# Patient Record
Sex: Female | Born: 1935 | Race: Asian | Hispanic: No | Smoking: Never smoker
Health system: Southern US, Community
[De-identification: ages and names within clinical notes are randomized; demographics above are authoritative.]

## PROBLEM LIST (undated history)

## (undated) DIAGNOSIS — W19XXXA Unspecified fall, initial encounter: Secondary | ICD-10-CM

## (undated) DIAGNOSIS — N12 Tubulo-interstitial nephritis, not specified as acute or chronic: Secondary | ICD-10-CM

## (undated) DIAGNOSIS — F0280 Dementia in other diseases classified elsewhere without behavioral disturbance: Secondary | ICD-10-CM

## (undated) DIAGNOSIS — M1990 Unspecified osteoarthritis, unspecified site: Secondary | ICD-10-CM

## (undated) HISTORY — DX: Unspecified fall, initial encounter: W19.XXXA

## (undated) HISTORY — DX: Unspecified osteoarthritis, unspecified site: M19.90

## (undated) HISTORY — DX: Tubulo-interstitial nephritis, not specified as acute or chronic: N12

## (undated) HISTORY — DX: Dementia in other diseases classified elsewhere, unspecified severity, without behavioral disturbance, psychotic disturbance, mood disturbance, and anxiety: F02.80

---
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
## 2020-07-10 ENCOUNTER — Telehealth: Payer: Self-pay | Admitting: Family

## 2020-07-10 NOTE — Telephone Encounter (Signed)
Late Entry 07/07/2020: [facility] Nurse called on call provider states patient's Urologist ordered in and out cath every 6 hr but patient's son declines would like patient to be toilet every six hours even unable to void then do a bladder scan then in and out cath.Nurse unable to reach urologist but will call on 07/10/2020.Orders given per POA's request.

## 2020-07-19 ENCOUNTER — Non-Acute Institutional Stay: Payer: Medicare Other | Admitting: Internal Medicine

## 2020-07-19 ENCOUNTER — Other Ambulatory Visit: Payer: Self-pay

## 2020-07-19 ENCOUNTER — Encounter: Payer: Self-pay | Admitting: Internal Medicine

## 2020-07-19 VITALS — BP 120/74 | HR 65 | Temp 97.2°F | Ht 61.0 in | Wt 140.5 lb

## 2020-07-19 DIAGNOSIS — F0390 Unspecified dementia without behavioral disturbance: Secondary | ICD-10-CM

## 2020-07-19 DIAGNOSIS — Z8616 Personal history of COVID-19: Secondary | ICD-10-CM

## 2020-07-19 DIAGNOSIS — N3941 Urge incontinence: Secondary | ICD-10-CM | POA: Insufficient documentation

## 2020-07-19 DIAGNOSIS — R2689 Other abnormalities of gait and mobility: Secondary | ICD-10-CM | POA: Diagnosis not present

## 2020-07-19 DIAGNOSIS — N390 Urinary tract infection, site not specified: Secondary | ICD-10-CM | POA: Insufficient documentation

## 2020-07-19 DIAGNOSIS — R42 Dizziness and giddiness: Secondary | ICD-10-CM | POA: Diagnosis not present

## 2020-07-19 DIAGNOSIS — G301 Alzheimer's disease with late onset: Secondary | ICD-10-CM

## 2020-07-19 DIAGNOSIS — R29898 Other symptoms and signs involving the musculoskeletal system: Secondary | ICD-10-CM

## 2020-07-19 DIAGNOSIS — R4182 Altered mental status, unspecified: Secondary | ICD-10-CM | POA: Insufficient documentation

## 2020-07-19 DIAGNOSIS — R350 Frequency of micturition: Secondary | ICD-10-CM | POA: Insufficient documentation

## 2020-07-19 DIAGNOSIS — Z7409 Other reduced mobility: Secondary | ICD-10-CM

## 2020-07-19 DIAGNOSIS — N9089 Other specified noninflammatory disorders of vulva and perineum: Secondary | ICD-10-CM

## 2020-07-19 DIAGNOSIS — I10 Essential (primary) hypertension: Secondary | ICD-10-CM | POA: Insufficient documentation

## 2020-07-19 DIAGNOSIS — U071 COVID-19: Secondary | ICD-10-CM | POA: Insufficient documentation

## 2020-07-19 DIAGNOSIS — R609 Edema, unspecified: Secondary | ICD-10-CM | POA: Insufficient documentation

## 2020-07-19 DIAGNOSIS — M4800 Spinal stenosis, site unspecified: Secondary | ICD-10-CM

## 2020-07-19 DIAGNOSIS — F0280 Dementia in other diseases classified elsewhere without behavioral disturbance: Secondary | ICD-10-CM

## 2020-07-19 DIAGNOSIS — N319 Neuromuscular dysfunction of bladder, unspecified: Secondary | ICD-10-CM

## 2020-07-19 DIAGNOSIS — K5900 Constipation, unspecified: Secondary | ICD-10-CM | POA: Diagnosis not present

## 2020-07-19 DIAGNOSIS — R8271 Bacteriuria: Secondary | ICD-10-CM

## 2020-07-19 DIAGNOSIS — J159 Unspecified bacterial pneumonia: Secondary | ICD-10-CM | POA: Insufficient documentation

## 2020-07-19 DIAGNOSIS — R531 Weakness: Secondary | ICD-10-CM

## 2020-07-19 HISTORY — DX: Other abnormalities of gait and mobility: R26.89

## 2020-07-19 HISTORY — DX: Unspecified dementia, unspecified severity, without behavioral disturbance, psychotic disturbance, mood disturbance, and anxiety: F03.90

## 2020-07-19 HISTORY — DX: Spinal stenosis, site unspecified: M48.00

## 2020-07-19 HISTORY — DX: Other symptoms and signs involving the musculoskeletal system: R29.898

## 2020-07-19 HISTORY — DX: Constipation, unspecified: K59.00

## 2020-07-19 MED ORDER — ESTRADIOL 2 MG VA RING: 2.0000 mg | VAGINAL_RING | VAGINAL | 1 refills | Status: DC

## 2020-07-19 NOTE — Progress Notes (Signed)
Provider:  Rexene Edison. Mariea Clonts, D.O., C.M.D. Location:  Artist of Service:  Clinic (12)  Previous PCP:  Gayland Curry, DO Patient Care Team: Gayland Curry, DO as PCP - General (Geriatric Medicine) Domingo Pulse, MD (Urology)  Extended Emergency Contact Information Primary Emergency Contact: Otilio Jefferson Home Phone: [phone] Work Phone: [phone] Mobile Phone: [phone] Relation: Son Secondary Emergency Contact: Sundra Aland Work Phone: [phone] Mobile Phone: [phone] Relation: Spouse  Code Status: DNR Goals of Care: Advanced Directive information Advanced Directives 07/19/2020  Does Patient Have a Medical Advance Directive? Yes  Type of Paramedic of [place];Out of facility DNR (pink MOST or yellow form)  Does patient want to make changes to medical advance directive? No - Patient declined  Copy of [place] in Chart? Yes - validated most recent copy scanned in chart (See row information)  Pre-existing out of facility DNR order (yellow form or pink MOST form) Pink MOST/Yellow Form most recent copy in chart - Physician notified to receive inpatient order  Has living will and durable POA for [facility] on file at [place] and copies were to be printed and scanned into vynca   Chief Complaint  Patient presents with  . Establish Care    New patient to establish care     HPI: Patient is a 85 y.o. female seen today to establish with [facility] at [facility].  Some records are in her chart from her prior neurologist.  Her son, Dr. Dwana Monroe, is here with her today for this first appointment--he lives closest by in [place] and is a Regulatory affairs officer.  Cheryl Monroe has been staying temporarily in rehab because her urinary incontinence was so severe and she could not be managed in assisted living.    According to her, she "pees all of the time."  She tries to push herself up, urine  just comes out.    Per Raj, 2016 she was at her baseline.    2018 in Feb, at some point, started having urinary retention, developed sepsis and was found down.  Had a 6 week admission.  She'd complained of heaviness of her legs.  She'd started having cognitive loss but recently the word finding has been much worse.   Workup in [facility] was "incomplete" per her son.  Had full-on delirium then.  Tumors ruled out.  A lot may be deconditioning.  Her son says she spent a lot of time in bed.  Previously was able to manage independently.  She could no longer reference the mall she'd known since 1999--enjoyed shopping.  Urosepsis made her cognitive deficits more notable.  She saw Dr. Amalia Hailey here.  She also has had issues with constipation contributing to her bladder control and retention.  Had not been emptying for a long time.  She's been started on estrace cream per his notes but was actually on estring already which long-term care pharmacy could not obtain.  She is now on the macrodantin prophylaxis.  E coli and klebsiella have been her typical organisms.  Was resistant to keflex and bactrim.  Dr. Amalia Hailey has suggested I/O caths by patient.  Nursing has been doing the I/o caths.  Her daughter had been doing caths on her 1-2 per week,not several times per day.  There was no surveillance for the wet pads b/c she still lived alone and her daughter checked in on her in her last home.  Last episode of UTI was in  spring this past year.    Caths are now at bid (ordered qid prn, but only needed bid).  Her son thinks she is not doing as well being at skilled level.  He worries about her cognition in a SNF environment. He admits that she classically spent her days sitting at home in bed and was no longer very active.    Nursing and therapy report she has had improvement in strength and mobility since arrival, but remains a skilled level of care due to her incontinence.  MMSE 13/30, failing clock drawing.   Past  Medical History:  Diagnosis Date  . Alzheimer disease (HCC)   . Fall   . Osteoarthritis   . Pyelonephritis    History reviewed. No pertinent surgical history.  Social History   Socioeconomic History  . Marital status: Legally Separated    Spouse name: Not on file  . Number of children: 3  . Years of education: Not on file  . Highest education level: Some college, no degree  Occupational History  . Not on file  Tobacco Use  . Smoking status: Never Smoker  . Smokeless tobacco: Never Used  Substance and Sexual Activity  . Alcohol use: Not on file  . Drug use: Not Currently  . Sexual activity: Not on file  Other Topics Concern  . Not on file  Social History Narrative  . Not on file   Social Determinants of Health   Financial Resource Strain:   . Difficulty of Paying Living Expenses: Not on file  Food Insecurity:   . Worried About Programme researcher, broadcasting/film/video in the Last Year: Not on file  . Ran Out of Food in the Last Year: Not on file  Transportation Needs:   . Lack of Transportation (Medical): Not on file  . Lack of Transportation (Non-Medical): Not on file  Physical Activity:   . Days of Exercise per Week: Not on file  . Minutes of Exercise per Session: Not on file  Stress:   . Feeling of Stress : Not on file  Social Connections:   . Frequency of Communication with Friends and Family: Not on file  . Frequency of Social Gatherings with Friends and Family: Not on file  . Attends Religious Services: Not on file  . Active Member of Clubs or Organizations: Not on file  . Attends Banker Meetings: Not on file  . Marital Status: Not on file    reports that she has never smoked. She has never used smokeless tobacco. She reports previous drug use. No history on file for alcohol use.   Family History  Problem Relation Age of Onset  . ALS Mother     Health Maintenance  Topic Date Due  . TETANUS/TDAP  Never done  . DEXA SCAN  Never done  . PNA vac Low Risk  Adult (2 of 2 - PPSV23) 09/29/2019  . INFLUENZA VACCINE  05/21/2020  . COVID-19 Vaccine  Completed    Allergies  Allergen Reactions  . Sulfa Antibiotics Rash    Rash to trunk, legs, neck, scalp, and arms    Outpatient Encounter Medications as of 07/19/2020  Medication Sig  . Cranberry (THERACRAN PO) Take 360 mg by mouth.  . D-MANNOSE PO Take 300 g by mouth.  . docusate sodium (COLACE) 100 MG capsule Take 100 mg by mouth 2 (two) times daily.  Marland Kitchen senna (SENOKOT) 8.6 MG tablet Take 1 tablet by mouth daily.  . [DISCONTINUED] nitrofurantoin, macrocrystal-monohydrate, (MACROBID) 100 MG  capsule Take 100 mg by mouth 2 (two) times daily.  Marland Kitchen estradiol (ESTRING) 2 MG vaginal ring Place 2 mg vaginally every 3 (three) months. follow package directions   No facility-administered encounter medications on file as of 07/19/2020.    Review of Systems  Constitutional: Negative for chills, fever and malaise/fatigue.  HENT: Negative for congestion and sore throat.   Eyes: Negative for blurred vision.  Respiratory: Negative for cough and shortness of breath.   Cardiovascular: Negative for chest pain, palpitations and leg swelling.  Gastrointestinal: Positive for constipation. Negative for abdominal pain, blood in stool, diarrhea, heartburn and Monroe.  Genitourinary: Positive for frequency and urgency. Negative for dysuria, flank pain and hematuria.  Musculoskeletal: Positive for back pain, falls and myalgias. Negative for joint pain.  Skin: Negative for itching and rash.  Neurological: Positive for tingling, sensory change and weakness. Negative for dizziness and loss of consciousness.  Endo/Heme/Allergies: Does not bruise/bleed easily.  Psychiatric/Behavioral: Positive for memory loss. Negative for depression and hallucinations. The patient is not nervous/anxious and does not have insomnia.     Vitals:   05/29/20 0953 07/19/20 1000  BP: 140/80 120/74  Pulse: 69 65  Temp: (!) 97 F (36.1 C) (!)  97.2 F (36.2 C)  TempSrc:  Temporal  SpO2:  97%  Weight: 144 lb (65.3 kg) 140 lb 8 oz (63.7 kg)  Height:  $Remove'5\' 1"'IYuWOLP$  (1.549 m)   Body mass index is 26.55 kg/m. Physical Exam Vitals reviewed.  Constitutional:      General: She is not in acute distress.    Appearance: Normal appearance. She is normal weight. She is not toxic-appearing.  HENT:     Head: Normocephalic and atraumatic.     Right Ear: External ear normal.     Left Ear: External ear normal.     Nose: Nose normal.     Mouth/Throat:     Pharynx: Oropharynx is clear.  Eyes:     Extraocular Movements: Extraocular movements intact.     Conjunctiva/sclera: Conjunctivae normal.     Pupils: Pupils are equal, round, and reactive to light.  Cardiovascular:     Rate and Rhythm: Normal rate and regular rhythm.     Pulses: Normal pulses.     Heart sounds: Normal heart sounds.  Pulmonary:     Effort: Pulmonary effort is normal.     Breath sounds: Normal breath sounds. No wheezing, rhonchi or rales.  Abdominal:     General: Bowel sounds are normal. There is no distension.     Palpations: Abdomen is soft.     Tenderness: There is no abdominal tenderness. There is no right CVA tenderness, left CVA tenderness, guarding or rebound.  Musculoskeletal:        General: Normal range of motion.     Cervical back: Neck supple.     Right lower leg: No edema.     Left lower leg: Edema present.     Comments: Slight edema left vs right ankle and foot; cooler temp of left foot and sensation diminished vs right on exam; slight LLE weakness--4+/5 vs rightDP and PT pulses were intact; very difficult getting up out of chair and off of exam table--required cues to put hands down on chair or table to push up then reach for walker, moves slowly; also struggled to sit up from lying down; had been pushed in wheelchair from rehab to clinic due to distance  Lymphadenopathy:     Cervical: No cervical adenopathy.  Skin:  General: Skin is warm and dry.    Neurological:     Mental Status: She is alert.     Cranial Nerves: No cranial nerve deficit.     Sensory: Sensory deficit present.     Motor: Weakness present.     Coordination: Coordination normal.     Gait: Gait abnormal.     Deep Tendon Reflexes: Reflexes abnormal (1+).     Comments: Oriented to person, place, not time; knows family members  Psychiatric:        Mood and Affect: Mood normal.     Comments: Very pleasant lady     Labs reviewed: Basic Metabolic Panel: No results for input(s): NA, K, CL, CO2, GLUCOSE, BUN, CREATININE, CALCIUM, MG, PHOS in the last 8760 hours. Liver Function Tests: No results for input(s): AST, ALT, ALKPHOS, BILITOT, PROT, ALBUMIN in the last 8760 hours. No results for input(s): LIPASE, AMYLASE in the last 8760 hours. No results for input(s): AMMONIA in the last 8760 hours. CBC: No results for input(s): WBC, NEUTROABS, HGB, HCT, MCV, PLT in the last 8760 hours. Cardiac Enzymes: No results for input(s): CKTOTAL, CKMB, CKMBINDEX, TROPONINI in the last 8760 hours. BNP: Invalid input(s): POCBNP No results found for: HGBA1C No results found for: TSH No results found for: VITAMINB12 No results found for: FOLATE No results found for: IRON, TIBC, FERRITIN  Imaging and Procedures noted on new patient packet: Echo this year with mild AR, grade 2 left ventricular diastolic dysfunction   Records are at the office where they were being abstracted by CMA, not at facility to complete note  Assessment/Plan 1. Orthostatic dizziness -cont to encourage adequate hydration, slow positional changes, may implement compression hose and/or abdominal compression binder if significant over time  2. Balance problem -cont PT, OT and SNF  3. Constipation, unspecified constipation type -maintained on bid colace, and also daily senokot; standing order prn miralax available when needed  4. Clitoral irritation -has been on estrogen ring for this previously and will  need to obtain from outside pharmacy so sent to [facility] and [name] to pick up  5. Asymptomatic bacteriuria -notable, avoid overtesting urine--only check when mcgeer criteria symptoms met, push fluids first -cont D mannose--discussed that the date may not be there for it, but some patient still reap benefits -also on cranberry capsules with similar efficacy -cont estring  -sounds like she has some genitourinary syndrome of menopause/postmenopause  6. Urinary frequency -related to above, has tried multiple medications and therapies, followed closely by Dr. Amalia Hailey now her locally--keep f/u as planned -was retaining so continue bid I/O caths that have been needed, does void on own as well -prevent constipation  -change undergarments regularly and maintain good hygiene which requires snf or caregiver in AL level  7. Urge incontinence -again, as in #7  8. Weakness of left lower extremity -obtain bilateral arterial dopplers with ABIs and get results to me or NP  9. Essential hypertension -bp controlled and not on meds recently  10. Weakness -cont therapy and SNF assistance  11. Impaired mobility -concerning with fall risk and has fallen previously in AL here, continue walker and functional assistance, PT, OT  12. h/o COVID-19 -noted  13. Spinal stenosis, unspecified spinal region -per records, had some increased back pain when transferred to snf from AL after fall and her severe incontinence -improved now, may use tylenol and topical therapies, heat for this  14.  Alzheimer's disease -biggest issue, likely also contributory to her urinary incontinence (functional component) -she  requires assistance with bathing, dressing, grooming and quite a bit of cuing for transfers  Labs/tests ordered:  Cbc, cmp, flp, tsh, b12, arterial dopplers with ABI bilateral  Adolfo Granieri L. Venus Ruhe, D.O. [facility] [address], [place] [address] Cell Phone  (Mon-Fri 8am-5pm):  [phone] On Call:  [phone] & follow prompts after 5pm & weekends Office Phone:  [phone] Office Fax:  [phone]

## 2020-07-20 ENCOUNTER — Encounter: Payer: Self-pay | Admitting: Adult Health

## 2020-07-20 ENCOUNTER — Encounter: Payer: Self-pay | Admitting: Internal Medicine

## 2020-07-20 ENCOUNTER — Non-Acute Institutional Stay (SKILLED_NURSING_FACILITY): Payer: Medicare Other | Admitting: Adult Health

## 2020-07-20 DIAGNOSIS — T50905A Adverse effect of unspecified drugs, medicaments and biological substances, initial encounter: Secondary | ICD-10-CM | POA: Diagnosis not present

## 2020-07-20 LAB — LIPID PANEL
Cholesterol: 235 — AB (ref 0–200)
HDL: 100 — AB (ref 35–70)
LDL Cholesterol: 134
Triglycerides: 102 (ref 40–160)

## 2020-07-20 LAB — COMPREHENSIVE METABOLIC PANEL
Albumin: 4.2 (ref 3.5–5.0)
Calcium: 9.7 (ref 8.7–10.7)
Globulin: 2.9

## 2020-07-20 LAB — CBC: RBC: 5.01 (ref 3.87–5.11)

## 2020-07-20 LAB — HEPATIC FUNCTION PANEL
ALT: 13 (ref 7–35)
AST: 18 (ref 13–35)

## 2020-07-20 LAB — BASIC METABOLIC PANEL
BUN: 22 — AB (ref 4–21)
CO2: 22 (ref 13–22)
Chloride: 100 (ref 99–108)
Creatinine: 0.8 (ref 0.5–1.1)
Glucose: 175
Potassium: 4.5 (ref 3.4–5.3)
Sodium: 135 — AB (ref 137–147)

## 2020-07-20 LAB — CBC AND DIFFERENTIAL
HCT: 44 (ref 36–46)
Hemoglobin: 14.8 (ref 12.0–16.0)
Platelets: 225 (ref 150–399)
WBC: 6.4

## 2020-07-20 LAB — VITAMIN B12: Vitamin B-12: 479

## 2020-07-20 LAB — TSH: TSH: 1.13 (ref 0.41–5.90)

## 2020-07-20 NOTE — Progress Notes (Signed)
Location:  Medical illustrator of Service:  SNF (31) Provider:   Peggye Ley, ANP [facility] [phone]   [name], DO  Patient Care Team: [name], DO as PCP - General (Geriatric Medicine) Jamison Neighbor, MD (Urology)  Extended Emergency Contact Information Primary Emergency Contact: Serayah, Yazdani Home Phone: [phone] Work Phone: [phone] Mobile Phone: [phone] Relation: Son Secondary Emergency Contact: Corinna Lines Work Phone: [phone] Mobile Phone: [phone] Relation: Spouse  Code Status:  DNR Goals of care: Advanced Directive information Advanced Directives 07/19/2020  Does Patient Have a Medical Advance Directive? Yes  Type of Estate agent of [place];Out of facility DNR (pink MOST or yellow form)  Does patient want to make changes to medical advance directive? No - Patient declined  Copy of Healthcare Power of Attorney in Chart? Yes - validated most recent copy scanned in chart (See row information)  Pre-existing out of facility DNR order (yellow form or pink MOST form) Pink MOST/Yellow Form most recent copy in chart - Physician notified to receive inpatient order     Chief Complaint  Patient presents with  . Acute Visit    rash    HPI:  Pt is a 85 y.o. female seen today for an acute visit for itchy rash. She started with an itchy rash to the buttocks area on 9/29.  On 9/30 the rash had spread to her back, buttocks, thigh, abd, arms, neck, and scalp. She is very itchy and had a dose of zyrtec last evening.  She is not having any wheezing, sob, or fever . She completed a 1 week course of Bactrim for UTI on 9/28 and started back on preventative UTI treatment with macrobid on 9/29. I spoke with her son and he indicated that she had not had this reaction before with sulfa. She has been on macrobid since at least August.     Past Medical History:  Diagnosis Date  . Alzheimer  disease (HCC)   . Fall   . Osteoarthritis   . Pyelonephritis    History reviewed. No pertinent surgical history.  No Known Allergies  Outpatient Encounter Medications as of 07/20/2020  Medication Sig  . cetirizine (ZYRTEC) 10 MG tablet Take 10 mg by mouth at bedtime.  . predniSONE (DELTASONE) 10 MG tablet Take 40 mg by mouth daily with breakfast. Prednisone 40 mg qd x 4, 30 mg qd x 3, 20 mg qd x 2d, 10 mg qd x 1  . Cranberry (THERACRAN PO) Take 360 mg by mouth.  . D-MANNOSE PO Take 300 g by mouth.  . docusate sodium (COLACE) 100 MG capsule Take 100 mg by mouth 2 (two) times daily.  Marland Kitchen estradiol (ESTRING) 2 MG vaginal ring Place 2 mg vaginally every 3 (three) months. follow package directions  . senna (SENOKOT) 8.6 MG tablet Take 1 tablet by mouth daily.  . [DISCONTINUED] nitrofurantoin, macrocrystal-monohydrate, (MACROBID) 100 MG capsule Take 100 mg by mouth 2 (two) times daily.   No facility-administered encounter medications on file as of 07/20/2020.    Review of Systems  Constitutional: Negative for activity change, appetite change, chills, diaphoresis and fatigue.  Respiratory: Negative for cough, shortness of breath and wheezing.   Skin: Positive for rash (pruritis).    Immunization History  Administered Date(s) Administered  . Influenza-Unspecified 08/05/2018, 09/29/2018, 07/02/2019, 08/21/2019  . Moderna SARS-COVID-2 Vaccination 11/04/2019, 12/02/2019  . Pneumococcal Conjugate-13 09/28/2018   Pertinent  Health Maintenance Due  Topic Date Due  .  DEXA SCAN  Never done  . PNA vac Low Risk Adult (2 of 2 - PPSV23) 09/29/2019  . INFLUENZA VACCINE  05/21/2020   Fall Risk  07/19/2020  Falls in the past year? 1  Number falls in past yr: 0  Injury with Fall? 0   Functional Status Survey:    Vitals:   07/20/20 1041  BP: 114/70  Pulse: 67  Resp: 16  Temp: (!) 97.2 F (36.2 C)  SpO2: 97%   There is no height or weight on file to calculate BMI. Physical Exam Vitals  and nursing note reviewed.  Constitutional:      General: She is not in acute distress.    Appearance: She is not diaphoretic.  HENT:     Head: Normocephalic and atraumatic.  Neck:     Vascular: No JVD.  Cardiovascular:     Rate and Rhythm: Normal rate and regular rhythm.     Heart sounds: No murmur heard.   Pulmonary:     Effort: Pulmonary effort is normal. No respiratory distress.     Breath sounds: Normal breath sounds. No wheezing.  Skin:    General: Skin is warm and dry.     Findings: Erythema and rash (macular rash to buttocks, trunk, arms, thighs, neck, and scalp) present.  Neurological:     Mental Status: She is alert and oriented to person, place, and time.     Labs reviewed: No results for input(s): NA, K, CL, CO2, GLUCOSE, BUN, CREATININE, CALCIUM, MG, PHOS in the last 8760 hours. No results for input(s): AST, ALT, ALKPHOS, BILITOT, PROT, ALBUMIN in the last 8760 hours. No results for input(s): WBC, NEUTROABS, HGB, HCT, MCV, PLT in the last 8760 hours. No results found for: TSH No results found for: HGBA1C No results found for: CHOL, HDL, LDLCALC, LDLDIRECT, TRIG, CHOLHDL  Significant Diagnostic Results in last 30 days:  No results found.  Assessment/Plan  1. Adverse effect of drug, initial encounter Prednisone 40 mg qd x 4, 30 mg qd x 3, 20 mg qd x 2d, 10 mg qd x 1 then d/c Zyrtec 10 mg qhs x 1 week Likely sulfa reaction, added to allergy list.  Monitor for wheezing, fever, sob.  D/c hydrocortisone cream     Family/ staff Communication: discussed her care with her son Dr. Sherryll Burger  Labs/tests ordered:  pending

## 2020-07-26 ENCOUNTER — Telehealth: Payer: Self-pay

## 2020-07-26 ENCOUNTER — Encounter: Payer: Self-pay | Admitting: Internal Medicine

## 2020-07-26 DIAGNOSIS — Z8616 Personal history of COVID-19: Secondary | ICD-10-CM | POA: Insufficient documentation

## 2020-07-26 NOTE — Telephone Encounter (Signed)
Per Clance Boll lab resulted as  Glucose is  elevation is likely due to Stress with allergic reaction.

## 2020-08-07 ENCOUNTER — Telehealth: Payer: Self-pay

## 2020-08-07 NOTE — Telephone Encounter (Signed)
Pt's son was going to purchase this for her as of a few weeks ago when I first sent it.  No need to do anything else now.

## 2020-08-07 NOTE — Telephone Encounter (Signed)
Fax received from pharmacy stating that Estring 2 mg ring not covered by insurance. Estradiol does not require authorization. Would you like to proceed with the prior authorization or sned in script for Estradiol? Please advise.

## 2020-08-08 NOTE — Telephone Encounter (Signed)
Ok thanks you

## 2020-08-11 ENCOUNTER — Encounter: Payer: Self-pay | Admitting: Adult Health

## 2020-08-11 ENCOUNTER — Non-Acute Institutional Stay (SKILLED_NURSING_FACILITY): Payer: Medicare Other | Admitting: Adult Health

## 2020-08-11 DIAGNOSIS — R531 Weakness: Secondary | ICD-10-CM

## 2020-08-11 DIAGNOSIS — K5901 Slow transit constipation: Secondary | ICD-10-CM

## 2020-08-11 DIAGNOSIS — F0280 Dementia in other diseases classified elsewhere without behavioral disturbance: Secondary | ICD-10-CM

## 2020-08-11 DIAGNOSIS — N3941 Urge incontinence: Secondary | ICD-10-CM

## 2020-08-11 DIAGNOSIS — G301 Alzheimer's disease with late onset: Secondary | ICD-10-CM | POA: Diagnosis not present

## 2020-08-11 DIAGNOSIS — N319 Neuromuscular dysfunction of bladder, unspecified: Secondary | ICD-10-CM

## 2020-08-11 NOTE — Progress Notes (Signed)
Location:  Medical illustrator of Service:  SNF (31) Provider:   Peggye Ley, ANP [facility] [phone]  [name], DO  Patient Care Team: [name], DO as PCP - General (Geriatric Medicine) Jamison Neighbor, MD (Urology)  Extended Emergency Contact Information Primary Emergency Contact: Sherill, Mangen Home Phone: [phone] Work Phone: [phone] Mobile Phone: [phone] Relation: Son Secondary Emergency Contact: Corinna Lines Work Phone: [phone] Mobile Phone: [phone] Relation: Spouse  Code Status:  DNR Goals of care: Advanced Directive information Advanced Directives 07/19/2020  Does Patient Have a Medical Advance Directive? Yes  Type of Estate agent of [place];Out of facility DNR (pink MOST or yellow form)  Does patient want to make changes to medical advance directive? No - Patient declined  Copy of Healthcare Power of Attorney in Chart? Yes - validated most recent copy scanned in chart (See row information)  Pre-existing out of facility DNR order (yellow form or pink MOST form) Pink MOST/Yellow Form most recent copy in chart - Physician notified to receive inpatient order     Chief Complaint  Patient presents with   Acute Visit    f/u UTI and rash     HPI:  Pt is a 85 y.o. female seen today for an acute visit for f/u regarding UTI and rash, along with dementia and rehab stay.  Ms. Kamer was admitted with progressive dementia MMSE 13/30, along with a UTI on Bactrim and developed a drug rash. She was treated with prednisone and this resolved. She has a hx of recurrent UTI and retention and is followed by Urology. She has an apt next week. She is no longer being catheterized. She is voiding adequately but continues with incontinence. She needs help with brief changes, cleaning, dressing, etc. She assesses at a skilled level of care. Her family would like to find her an AL environment  instead. Her nurse reports her bowels are moving well and that sometimes her senokot has to be held due to loose stools. Constipation had been a contributor to her retention.  When she was admitted there was concern for orthostatic hypotension but at this time she is not having any issues with this. She continues to be a fall risk as she has an unsteady gait and forgets her walker.   She previously had spinal stenosis and back pain and needed tylenol and ibuprofen but this has resolved and has not been used.  Past Medical History:  Diagnosis Date   Alzheimer disease (HCC)    Fall    Osteoarthritis    Pyelonephritis    History reviewed. No pertinent surgical history.  Allergies  Allergen Reactions   Sulfa Antibiotics Rash    Rash to trunk, legs, neck, scalp, and arms    Outpatient Encounter Medications as of 08/11/2020  Medication Sig   acetaminophen (TYLENOL) 500 MG tablet Take 1,000 mg by mouth 3 (three) times daily as needed.   ibuprofen (ADVIL) 200 MG tablet Take 200 mg by mouth every 8 (eight) hours as needed.   Nitrofurantoin Monohyd Macro (MACROBID PO) Take 50 mg by mouth daily.   Cranberry (THERACRAN PO) Take 360 mg by mouth in the morning and at bedtime.    D-MANNOSE PO Take 300 g by mouth.   docusate sodium (COLACE) 100 MG capsule Take 100 mg by mouth 2 (two) times daily as needed.    estradiol (ESTRING) 2 MG vaginal ring Place 2 mg vaginally every 3 (three) months. follow  package directions   senna (SENOKOT) 8.6 MG tablet Take 2 tablets by mouth daily.    [DISCONTINUED] cetirizine (ZYRTEC) 10 MG tablet Take 10 mg by mouth at bedtime.   No facility-administered encounter medications on file as of 08/11/2020.    Review of Systems  Constitutional: Negative for activity change, appetite change, chills, diaphoresis, fatigue, fever and unexpected weight change.  HENT: Negative for congestion.   Respiratory: Negative for cough, shortness of breath and wheezing.    Cardiovascular: Negative for chest pain, palpitations and leg swelling.  Gastrointestinal: Negative for abdominal distention, abdominal pain, constipation and diarrhea.  Genitourinary: Positive for frequency and urgency. Negative for decreased urine volume, difficulty urinating, dysuria, flank pain, pelvic pain and vaginal bleeding.       Incontinence  Musculoskeletal: Positive for gait problem. Negative for arthralgias, back pain, joint swelling and myalgias.  Neurological: Negative for dizziness, tremors, seizures, syncope, facial asymmetry, speech difficulty, weakness, light-headedness, numbness and headaches.  Psychiatric/Behavioral: Positive for confusion. Negative for agitation and behavioral problems.    Immunization History  Administered Date(s) Administered   Influenza-Unspecified 08/05/2018, 09/29/2018, 07/02/2019, 08/21/2019   Moderna SARS-COVID-2 Vaccination 11/04/2019, 12/02/2019   Pneumococcal Conjugate-13 09/28/2018   Pertinent  Health Maintenance Due  Topic Date Due   DEXA SCAN  Never done   PNA vac Low Risk Adult (2 of 2 - PPSV23) 09/29/2019   INFLUENZA VACCINE  05/21/2020   Fall Risk  07/19/2020  Falls in the past year? 1  Number falls in past yr: 0  Injury with Fall? 0   Functional Status Survey:    Vitals:   08/11/20 1320  Weight: 145 lb 3.2 oz (65.9 kg)   Body mass index is 27.44 kg/m. Physical Exam Vitals and nursing note reviewed.  Constitutional:      General: She is not in acute distress.    Appearance: She is not diaphoretic.  HENT:     Head: Normocephalic and atraumatic.  Neck:     Vascular: No JVD.  Cardiovascular:     Rate and Rhythm: Normal rate and regular rhythm.     Heart sounds: No murmur heard.   Pulmonary:     Effort: Pulmonary effort is normal. No respiratory distress.     Breath sounds: Normal breath sounds. Stridor:  No wheezing.  Abdominal:     General: Bowel sounds are normal. There is no distension.     Palpations:  Abdomen is soft.     Tenderness: There is no abdominal tenderness. There is no right CVA tenderness or left CVA tenderness.  Musculoskeletal:     Right lower leg: No edema.  Skin:    General: Skin is warm and dry.  Neurological:     General: No focal deficit present.     Mental Status: She is alert. Mental status is at baseline.  Psychiatric:        Mood and Affect: Mood normal.     Labs reviewed: Recent Labs    07/20/20 0000  NA 135*  K 4.5  CL 100  CO2 22  BUN 22*  CREATININE 0.8  CALCIUM 9.7   Recent Labs    07/20/20 0000  AST 18  ALT 13  ALBUMIN 4.2   Recent Labs    07/20/20 0000  WBC 6.4  HGB 14.8  HCT 44  PLT 225   Lab Results  Component Value Date   TSH 1.13 07/20/2020   No results found for: HGBA1C Lab Results  Component Value Date   CHOL  235 (A) 07/20/2020   HDL 100 (A) 07/20/2020   LDLCALC 134 07/20/2020   TRIG 102 07/20/2020    Significant Diagnostic Results in last 30 days:  No results found.  Assessment/Plan  1. Late onset Alzheimer's dementia without behavioral disturbance (HCC) Needs assistance with ADLs, as well as cuing and reminders. Fall risk as she forgets her walker. No behavioral concern are noted. Skilled support necessary.   2. Urge incontinence Continues to be a problem for her (AD conttributes)  3. Neurogenic bladder Voiding on her own at this time. Continues on Macrobid for UTI prophylaxis. Continue voiding q hrs and incontinence checks. F/U with urology   4. Weakness Improved with therapy at [facility] Recommend walker use at all times with fall prec  5. Slow transit constipation Controlled with senokot.     Family/ staff Communication: discussed with her nurse  Labs/tests ordered:  NA

## 2020-09-07 ENCOUNTER — Encounter: Payer: Self-pay | Admitting: Adult Health

## 2020-09-07 ENCOUNTER — Non-Acute Institutional Stay (SKILLED_NURSING_FACILITY): Payer: Medicare Other | Admitting: Adult Health

## 2020-09-07 DIAGNOSIS — N3945 Continuous leakage: Secondary | ICD-10-CM

## 2020-09-07 DIAGNOSIS — I10 Essential (primary) hypertension: Secondary | ICD-10-CM | POA: Diagnosis not present

## 2020-09-07 DIAGNOSIS — F0280 Dementia in other diseases classified elsewhere without behavioral disturbance: Secondary | ICD-10-CM

## 2020-09-07 DIAGNOSIS — G301 Alzheimer's disease with late onset: Secondary | ICD-10-CM

## 2020-09-07 DIAGNOSIS — K5901 Slow transit constipation: Secondary | ICD-10-CM

## 2020-09-07 DIAGNOSIS — N390 Urinary tract infection, site not specified: Secondary | ICD-10-CM

## 2020-09-07 DIAGNOSIS — R42 Dizziness and giddiness: Secondary | ICD-10-CM | POA: Diagnosis not present

## 2020-09-07 NOTE — Progress Notes (Addendum)
Location:  Occupational psychologist of Service:  SNF (31) Provider:   Cindi Carbon, ANP [place] [phone]  Gayland Curry, DO  Patient Care Team: Gayland Curry, DO as PCP - General (Geriatric Medicine) Domingo Pulse, MD (Urology)  Extended Emergency Contact Information Primary Emergency Contact: Darleny, Sem Home Phone: [phone] Work Phone: [phone] Mobile Phone: [phone] Relation: Son Secondary Emergency Contact: Sundra Aland Work Phone: [phone] Mobile Phone: [phone] Relation: Spouse  Goals of care: Advanced Directive information Advanced Directives 07/19/2020  Does Patient Have a Medical Advance Directive? Yes  Type of Paramedic of [place];Out of facility DNR (pink MOST or yellow form)  Does patient want to make changes to medical advance directive? No - Patient declined  Copy of [place] in Chart? Yes - validated most recent copy scanned in chart (See row information)  Pre-existing out of facility DNR order (yellow form or pink MOST form) Pink MOST/Yellow Form most recent copy in chart - Physician notified to receive inpatient order     Chief Complaint  Patient presents with  . Medical Management of Chronic Issues    HPI:  Pt is a 85 y.o. female seen today for medical management of chronic diseases.    For my visit Ms Patman states that she feels dizzy. She could not elaborate on this symptom. The staff state this is new but it is noted in the notes in Sept with standing. Last month she was placed on flomax to help with urinary incontinence and retention by urology. She stills frequently wets and does not remember to change her brief and needs assistance and cuing. No longer requires catheterization.    She has underlying dementia. She remains ambulatory and pleasant with no behavioral concerns.   BP Lying 146/78 74, Sitting 130/76 72, Standing 136/70 69  Noted  hx of recurrent UTis on macrobid. No current symptoms.   Nurse reports her bp ranges 096-283 systolic. Typically higher in the evening hrs. They are taking it 3 or more times a day. She had some mild edema and compression hose were ordered but at this time she is not wearing them and her edema has receded. She is not having any headache, chest pain, or sob.   Alk phos (265) was elevated in Sept with unclear significance as she is not having any abd pain, n, v, d, jaundice, etc.    Past Medical History:  Diagnosis Date  . Alzheimer disease ([place])   . Fall   . Osteoarthritis   . Pyelonephritis    History reviewed. No pertinent surgical history.  Allergies  Allergen Reactions  . Sulfa Antibiotics Rash    Rash to trunk, legs, neck, scalp, and arms    Outpatient Encounter Medications as of 09/07/2020  Medication Sig  . Cranberry (THERACRAN PO) Take 360 mg by mouth in the morning and at bedtime.   . D-MANNOSE PO Take 300 g by mouth.  . senna (SENOKOT) 8.6 MG tablet Take 2 tablets by mouth daily.   . tamsulosin (FLOMAX) 0.4 MG CAPS capsule Take 0.4 mg by mouth at bedtime.  Marland Kitchen acetaminophen (TYLENOL) 500 MG tablet Take 1,000 mg by mouth 3 (three) times daily as needed.  . docusate sodium (COLACE) 100 MG capsule Take 100 mg by mouth 2 (two) times daily as needed.   Marland Kitchen estradiol (ESTRING) 2 MG vaginal ring Place 2 mg vaginally every 3 (three) months. follow package directions  . ibuprofen (ADVIL)  200 MG tablet Take 200 mg by mouth every 8 (eight) hours as needed.  . Nitrofurantoin Monohyd Macro (MACROBID PO) Take 50 mg by mouth in the morning and at bedtime.    No facility-administered encounter medications on file as of 09/07/2020.    Review of Systems  Constitutional: Negative for activity change, appetite change, chills, diaphoresis, fatigue, fever and unexpected weight change.  HENT: Negative for congestion.   Respiratory: Negative for cough, shortness of breath and wheezing.     Cardiovascular: Positive for leg swelling. Negative for chest pain and palpitations.  Gastrointestinal: Negative for abdominal distention, abdominal pain, constipation and diarrhea.  Genitourinary: Negative for decreased urine volume, difficulty urinating, dysuria, frequency and urgency.  Musculoskeletal: Positive for gait problem. Negative for arthralgias, back pain, joint swelling and myalgias.  Skin: Negative for wound.  Neurological: Positive for dizziness. Negative for tremors, seizures, syncope, facial asymmetry, speech difficulty, weakness, light-headedness, numbness and headaches.  Psychiatric/Behavioral: Positive for confusion. Negative for agitation and behavioral problems.    Immunization History  Administered Date(s) Administered  . Influenza-Unspecified 08/05/2018, 09/29/2018, 07/02/2019, 08/21/2019, 08/15/2020  . Moderna SARS-COVID-2 Vaccination 11/04/2019, 12/02/2019  . Pneumococcal Conjugate-13 09/28/2018   Pertinent  Health Maintenance Due  Topic Date Due  . DEXA SCAN  Never done  . PNA vac Low Risk Adult (2 of 2 - PPSV23) 09/29/2019  . INFLUENZA VACCINE  Completed   Fall Risk  07/19/2020  Falls in the past year? 1  Number falls in past yr: 0  Injury with Fall? 0   Functional Status Survey:    Vitals:   09/07/20 1017  BP: 130/70  Pulse: 69  Resp: 17  Temp: 97.7 F (36.5 C)  SpO2: 97%  Weight: 150 lb 3.2 oz (68.1 kg)   Body mass index is 28.38 kg/m. Physical Exam Vitals and nursing note reviewed.  Constitutional:      General: She is not in acute distress.    Appearance: She is not diaphoretic.  HENT:     Head: Normocephalic and atraumatic.     Mouth/Throat:     Mouth: Mucous membranes are moist.     Pharynx: Oropharynx is clear.  Eyes:     General:        Right eye: No discharge.        Left eye: No discharge.     Extraocular Movements: Extraocular movements intact.     Conjunctiva/sclera: Conjunctivae normal.     Pupils: Pupils are equal,  round, and reactive to light.  Neck:     Vascular: No JVD.  Cardiovascular:     Rate and Rhythm: Normal rate and regular rhythm.     Heart sounds: No murmur heard.   Pulmonary:     Effort: Pulmonary effort is normal. No respiratory distress.     Breath sounds: Normal breath sounds. No wheezing.  Abdominal:     General: Bowel sounds are normal. There is no distension.     Palpations: Abdomen is soft.     Tenderness: There is no abdominal tenderness. There is no right CVA tenderness or left CVA tenderness.  Musculoskeletal:     Right lower leg: No edema.     Left lower leg: No edema.  Skin:    General: Skin is warm and dry.  Neurological:     General: No focal deficit present.     Mental Status: She is alert.  Psychiatric:        Mood and Affect: Mood normal.     Labs  reviewed: Recent Labs    07/20/20 0000  NA 135*  K 4.5  CL 100  CO2 22  BUN 22*  CREATININE 0.8  CALCIUM 9.7   Recent Labs    07/20/20 0000  AST 18  ALT 13  ALBUMIN 4.2   Recent Labs    07/20/20 0000  WBC 6.4  HGB 14.8  HCT 44  PLT 225   Lab Results  Component Value Date   TSH 1.13 07/20/2020   No results found for: HGBA1C Lab Results  Component Value Date   CHOL 235 (A) 07/20/2020   HDL 100 (A) 07/20/2020   LDLCALC 134 07/20/2020   TRIG 102 07/20/2020    Significant Diagnostic Results in last 30 days:  No results found.  Assessment/Plan 1. Dizziness Orthostatic bp normal.  Will check labs. Could be due to flomax   2. Late onset Alzheimer's dementia without behavioral disturbance ([place]) MMSE 21/30 on 07/21/20 Needs cuing and assistance with ADLs and incontinence care. Assesses at a skilled level of care, awaiting bed  3. Primary hypertension BP WNL for the visit however some readings are elevated at night. The readings provided are all automatic which can falsely elevated the BP. In addition she is a fall risk and I would want to be certain this is an issue before treating her  with medication. The staff does not need to wake her up at 1-2 am to check her bp which may cause her bp to go up. Recommend checking manually each day for 5 days and [facility] to [facility]  4. Continuous leakage of urine Due to this and memory loss she needs skilled care.   5. Recurrent UTI No current symptoms Followed by urology Takes macrobid 50 mg bid and supplements. Will need renal function monitored periodically   6. Slow transit constipation Difficult to assess as she takes herself to the bathroom Recommend continuing senokot 2 tabs qd.     Family/ staff Communication: discussed with her nurse  Labs/tests ordered:   CMP CBC in am

## 2020-09-10 DIAGNOSIS — F0280 Dementia in other diseases classified elsewhere without behavioral disturbance: Secondary | ICD-10-CM | POA: Insufficient documentation

## 2020-09-28 LAB — BASIC METABOLIC PANEL
BUN: 22 — AB (ref 4–21)
CO2: 27 — AB (ref 13–22)
Chloride: 96 — AB (ref 99–108)
Creatinine: 0.8 (ref 0.5–1.1)
Glucose: 96
Potassium: 4.4 (ref 3.4–5.3)
Sodium: 133 — AB (ref 137–147)

## 2020-09-28 LAB — COMPREHENSIVE METABOLIC PANEL: Calcium: 10 (ref 8.7–10.7)

## 2020-10-12 ENCOUNTER — Encounter: Payer: Self-pay | Admitting: Internal Medicine

## 2020-10-18 ENCOUNTER — Encounter: Payer: Self-pay | Admitting: Internal Medicine

## 2020-10-18 ENCOUNTER — Non-Acute Institutional Stay (SKILLED_NURSING_FACILITY): Payer: Medicare Other | Admitting: Internal Medicine

## 2020-10-18 ENCOUNTER — Other Ambulatory Visit: Payer: Self-pay

## 2020-10-18 VITALS — BP 121/55 | HR 63 | Temp 97.7°F | Ht 61.0 in | Wt 144.4 lb

## 2020-10-18 DIAGNOSIS — I10 Essential (primary) hypertension: Secondary | ICD-10-CM

## 2020-10-18 DIAGNOSIS — W19XXXA Unspecified fall, initial encounter: Secondary | ICD-10-CM

## 2020-10-18 DIAGNOSIS — N3941 Urge incontinence: Secondary | ICD-10-CM | POA: Diagnosis not present

## 2020-10-18 DIAGNOSIS — R8271 Bacteriuria: Secondary | ICD-10-CM

## 2020-10-18 DIAGNOSIS — G301 Alzheimer's disease with late onset: Secondary | ICD-10-CM

## 2020-10-18 DIAGNOSIS — F0280 Dementia in other diseases classified elsewhere without behavioral disturbance: Secondary | ICD-10-CM

## 2020-10-18 DIAGNOSIS — K5901 Slow transit constipation: Secondary | ICD-10-CM

## 2020-10-18 LAB — BASIC METABOLIC PANEL
BUN: 19 (ref 4–21)
CO2: 24 — AB (ref 13–22)
Chloride: 99 (ref 99–108)
Creatinine: 0.6 (ref 0.5–1.1)
Glucose: 91
Potassium: 4.1 (ref 3.4–5.3)
Sodium: 137 (ref 137–147)

## 2020-10-18 LAB — CBC AND DIFFERENTIAL
HCT: 34 — AB (ref 36–46)
Hemoglobin: 11.6 — AB (ref 12.0–16.0)
Platelets: 275 (ref 150–399)
WBC: 7.7

## 2020-10-18 LAB — CBC: RBC: 3.77 — AB (ref 3.87–5.11)

## 2020-10-18 LAB — COMPREHENSIVE METABOLIC PANEL: Calcium: 9.7 (ref 8.7–10.7)

## 2020-10-18 NOTE — Progress Notes (Addendum)
Location:  [facility] Provider:  Nuri Larmer L. Mariea Clonts, D.O., C.M.D.  Code Status: DNR Goals of Care:  Advanced Directives 10/18/2020  Does Patient Have a Medical Advance Directive? Yes  Type of Advance Directive Out of facility DNR (pink MOST or yellow form);Healthcare Power of Attorney  Does patient want to make changes to medical advance directive? No - Patient declined  Copy of [place] in Chart? Yes - validated most recent copy scanned in chart (See row information)  Pre-existing out of facility DNR order (yellow form or pink MOST form) Pink MOST/Yellow Form most recent copy in chart - Physician notified to receive inpatient order     Chief Complaint  Patient presents with  . Medical Management of Chronic Issues    3 month follow up     HPI: Patient is a 85 y.o. female with dementia, htn, orthostatic hypotension, weakness, prior UTIs, spinal stenosis, constipation and asymptomatic bacteriuria seen today for medical management of chronic diseases.    Nursing reported to me yesterday that resident had been up throughout the night and was difficult to redirect.  Of note, she'd been LOA for the holiday and adjusting back to SNF environment.  Her son, Dr. Manuella Ghazi, was requesting a urine sample be obtained due to the confusion.  There were no reported changes in urination, abdominal pain, fever, suprapubic or flank pain, dysuria.  She was not notably different outside of overnight with staff.  I/O cath UA c+s was ordered due to family request but nursing explained that antibiotics are not immediately initiated unless Mc[place]er criteria are met for UTI.  She then refused the I/O cath sample asking nursing to get away from her.  Her son then requested a clean catch (resident is incontinent so likely would be contaminated).  Nursing again referred to normal protocols.    Last night, resident fell and struck the left side of her face/cheek.    In the meantime, FL2 has been done  for transfer to [facility] for continued skilled care.    Reviewing her most recent labs prior to today revealed that sodium declined to 132 with start of hctz.  Had repeat that I ordered this am so I'm waiting on it, but she may need to come off hctz.  She reports still having hard pieces of stool that are challenging to evacuate.  She is on prn colace, but must request that.   Past Medical History:  Diagnosis Date  . Alzheimer disease ([place])   . Fall   . Osteoarthritis   . Pyelonephritis     No past surgical history on file.  Allergies  Allergen Reactions  . Sulfa Antibiotics Rash    Rash to trunk, legs, neck, scalp, and arms    Outpatient Encounter Medications as of 10/18/2020  Medication Sig  . acetaminophen (TYLENOL) 500 MG tablet Take 1,000 mg by mouth 3 (three) times daily as needed.  . Cranberry (THERACRAN PO) Take 360 mg by mouth in the morning and at bedtime.   . D-MANNOSE PO Take 300 g by mouth.  . docusate sodium (COLACE) 100 MG capsule Take 100 mg by mouth 2 (two) times daily as needed.   Marland Kitchen estradiol (ESTRING) 2 MG vaginal ring Place 2 mg vaginally every 3 (three) months. follow package directions  . ibuprofen (ADVIL) 200 MG tablet Take 200 mg by mouth every 8 (eight) hours as needed.  . Nitrofurantoin Monohyd Macro (MACROBID PO) Take 50 mg by mouth in the morning  and at bedtime.   . senna (SENOKOT) 8.6 MG tablet Take 2 tablets by mouth in the morning and at bedtime.  . tamsulosin (FLOMAX) 0.4 MG CAPS capsule Take 0.4 mg by mouth at bedtime.  . [DISCONTINUED] nitrofurantoin (MACRODANTIN) 50 MG capsule Take 50 mg by mouth 2 (two) times daily.   No facility-administered encounter medications on file as of 10/18/2020.    Review of Systems:  Review of Systems  Constitutional: Negative for chills, fever and malaise/fatigue.  HENT: Negative for congestion and sore throat.   Eyes: Negative for blurred vision.  Respiratory: Negative for cough and shortness of  breath.   Cardiovascular: Negative for chest pain, palpitations and leg swelling (mild edema left foot greater than right chronically).  Gastrointestinal: Positive for constipation. Negative for abdominal pain, blood in stool, diarrhea and melena.  Genitourinary: Positive for frequency and urgency. Negative for dysuria, flank pain and hematuria.       Leakage, incontinence  Musculoskeletal: Positive for back pain and falls. Negative for joint pain.  Skin: Negative for itching and rash.  Neurological: Positive for dizziness and weakness.       Weakness, unsteady gait, when I entered, she was cleaning up some urinary incontinence with a paper towel with her foot and had changed from one set of clothes that was soiled to another; nearly fell again when trying to sit back in her chair without use of her walker  Psychiatric/Behavioral: Positive for memory loss. The patient has insomnia. The patient is not nervous/anxious.        Word-finding challenges; has some nights where she cannot sleep well, but most are ok    Health Maintenance  Topic Date Due  . TETANUS/TDAP  Never done  . DEXA SCAN  Never done  . PNA vac Low Risk Adult (2 of 2 - PPSV23) 09/29/2019  . COVID-19 Vaccine (3 - Booster for Moderna series) 05/31/2020  . INFLUENZA VACCINE  Completed    Physical Exam: Vitals:   10/18/20 1052  BP: (!) 121/55  Pulse: 63  Temp: 97.7 F (36.5 C)  SpO2: 98%  Weight: 144 lb 6.4 oz (65.5 kg)  Height: $Remove'5\' 1"'LqcZfzb$  (1.549 m)   Body mass index is 27.28 kg/m. Physical Exam Vitals reviewed.  Constitutional:      Appearance: Normal appearance.  HENT:     Head:     Comments: Ecchymoses left cheek/temple    Right Ear: External ear normal.     Left Ear: External ear normal.     Nose: Nose normal.     Mouth/Throat:     Pharynx: Oropharynx is clear.  Eyes:     Extraocular Movements: Extraocular movements intact.     Conjunctiva/sclera: Conjunctivae normal.     Pupils: Pupils are equal, round, and  reactive to light.  Cardiovascular:     Rate and Rhythm: Normal rate and regular rhythm.  Pulmonary:     Effort: Pulmonary effort is normal.     Breath sounds: Normal breath sounds. No wheezing, rhonchi or rales.  Abdominal:     General: Bowel sounds are normal.     Palpations: Abdomen is soft.     Tenderness: There is no abdominal tenderness. There is no guarding or rebound.  Musculoskeletal:        General: Normal range of motion.     Cervical back: Neck supple.     Right lower leg: Edema present.     Left lower leg: No edema.     Comments: Wide-based gait;  keeps trying to go w/o walker  Lymphadenopathy:     Cervical: No cervical adenopathy.  Skin:    General: Skin is warm and dry.  Neurological:     General: No focal deficit present.     Mental Status: She is alert. Mental status is at baseline.     Motor: Weakness present.     Gait: Gait abnormal.     Comments: Oriented to person and place, word-finding limited and judgment limited (cleaning up floor of apt on own using towel on floor without walker)  Psychiatric:        Mood and Affect: Mood normal.        Behavior: Behavior normal.     Comments: Difficulty with word-finding     Labs reviewed: Basic Metabolic Panel: Recent Labs    07/20/20 0000 09/28/20 0000 10/18/20 0000  NA 135* 133* 137  K 4.5 4.4 4.1  CL 100 96* 99  CO2 22 27* 24*  BUN 22* 22* 19  CREATININE 0.8 0.8 0.6  CALCIUM 9.7 10.0 9.7  TSH 1.13  --   --    Liver Function Tests: Recent Labs    07/20/20 0000  AST 18  ALT 13  ALBUMIN 4.2   No results for input(s): LIPASE, AMYLASE in the last 8760 hours. No results for input(s): AMMONIA in the last 8760 hours. CBC: Recent Labs    07/20/20 0000 10/18/20 0000  WBC 6.4 7.7  HGB 14.8 11.6*  HCT 44 34*  PLT 225 275   Lipid Panel: Recent Labs    07/20/20 0000  CHOL 235*  HDL 100*  LDLCALC 134  TRIG 102   No results found for: HGBA1C  Procedures since last visit: No results  found.  Assessment/Plan 1. Urge incontinence -chronic, ongoing -certainly not helped by hctz added for blood pressures  -recommend adult undergarments and frequent hygiene  2. Late onset Alzheimer's dementia without behavioral disturbance ([place]) -with aphasia primarily but judgment poor about decisions  -cont SNF level of care  3. Primary hypertension -await Na level to determine if must stop hctz for hyponatremia (last low at 132 but then improved to 139) -suspect hyponatremia and recent change in environment caused episode of confusion 12/27 rather than UTI as her urinary symptoms are unchanged and she shows no signs of infection  If bp trends up to consistently over 150, will add losartan $RemoveBefo'25mg'rXuMggmByGV$  daily and recheck bmp in one week  4. Fall, initial encounter -reports falling asleep and falling out of chair striking left temple--minor tenderness, feels fine otherwise  5. Slow transit constipation -ongoing hard stools Keep colace, keep senna-s but make it two twice a day  and hold for loose stools  6. Asymptomatic bacteriuria -longstanding -is on prophylactic macrodantin resumed after treatment of last uti with cipro  FL2 done for [facility] [facility] prior to these changes  Labs/tests ordered:  Pending cbc, bmp Next appt:  3 mos if stays here  Ciel Yanes L. Thaniel Coluccio, D.O. [facility] [address], [place] [address] Cell Phone (Mon-Fri 8am-5pm):  [phone] On Call:  [phone] & follow prompts after 5pm & weekends Office Phone:  [phone] Office Fax:  [phone]  Addendum:  Na returned normal at 137.  However, h/h now 11.6/33.9 when previously 14.8/44.1 at admission.  Will check hemoccult stools x 3, iron panel with ferritin, b12, folate.  Ideally, should not take nsaids.    Skyanne Welle L. Yu Peggs, D.O. [facility] [address], [place] [address] Cell Phone (Mon-Fri 8am-5pm):   [phone] On Call:  [phone] & follow prompts after 5pm & weekends Office Phone:  [phone] Office Fax:  [phone]

## 2020-10-19 ENCOUNTER — Telehealth: Payer: Self-pay | Admitting: Adult Health

## 2020-10-19 ENCOUNTER — Encounter: Payer: Self-pay | Admitting: Adult Health

## 2020-10-19 ENCOUNTER — Encounter: Payer: Self-pay | Admitting: Internal Medicine

## 2020-10-19 NOTE — Telephone Encounter (Signed)
Nurse called form [facility] with UA report showing 75 leuk esterase nitrite negative blood negative WBC 11-20 Bacteria 4+ RBC 0-4 with a few mucous..  Pt is not having any symptoms of UTI except confusion. She had been on a trip off the skilled floor with her family. Her son is requesting Cipro be started right away. Order given.

## 2020-10-25 LAB — BASIC METABOLIC PANEL
BUN: 13 (ref 4–21)
CO2: 24 — AB (ref 13–22)
Chloride: 106 (ref 99–108)
Creatinine: 0.7 (ref 0.5–1.1)
Glucose: 101
Potassium: 5.5 — AB (ref 3.4–5.3)
Sodium: 143 (ref 137–147)

## 2020-10-25 LAB — COMPREHENSIVE METABOLIC PANEL: Calcium: 9.3 (ref 8.7–10.7)

## 2020-11-24 ENCOUNTER — Non-Acute Institutional Stay (SKILLED_NURSING_FACILITY): Payer: Medicare Other | Admitting: Adult Health

## 2020-11-24 ENCOUNTER — Encounter: Payer: Self-pay | Admitting: Adult Health

## 2020-11-24 DIAGNOSIS — R2689 Other abnormalities of gait and mobility: Secondary | ICD-10-CM | POA: Diagnosis not present

## 2020-11-24 DIAGNOSIS — G301 Alzheimer's disease with late onset: Secondary | ICD-10-CM

## 2020-11-24 DIAGNOSIS — K5901 Slow transit constipation: Secondary | ICD-10-CM | POA: Diagnosis not present

## 2020-11-24 DIAGNOSIS — M4800 Spinal stenosis, site unspecified: Secondary | ICD-10-CM

## 2020-11-24 DIAGNOSIS — F0280 Dementia in other diseases classified elsewhere without behavioral disturbance: Secondary | ICD-10-CM

## 2020-11-24 DIAGNOSIS — I10 Essential (primary) hypertension: Secondary | ICD-10-CM | POA: Diagnosis not present

## 2020-11-24 DIAGNOSIS — R600 Localized edema: Secondary | ICD-10-CM

## 2020-11-24 DIAGNOSIS — N390 Urinary tract infection, site not specified: Secondary | ICD-10-CM

## 2020-11-24 NOTE — Progress Notes (Signed)
Location:  [facility] Room Number: 140-A Place of Service:  SNF [phone]) Provider:  Fletcher Anon, NP     Patient Care Team: [name], DO as PCP - General (Geriatric Medicine) Jamison Neighbor, MD (Urology)  Extended Emergency Contact Information Primary Emergency Contact: Alvino Blood Home Phone: [phone] Work Phone: [phone] Mobile Phone: [phone] Relation: Son Secondary Emergency Contact: Corinna Lines Work Phone: [phone] Mobile Phone: [phone] Relation: Spouse  Code Status:  DNR Goals of care: Advanced Directive information Advanced Directives 11/24/2020  Does Patient Have a Medical Advance Directive? Yes  Type of Estate agent of [place];Out of facility DNR (pink MOST or yellow form)  Does patient want to make changes to medical advance directive? No - Patient declined  Copy of Healthcare Power of Attorney in Chart? Yes - validated most recent copy scanned in chart (See row information)  Pre-existing out of facility DNR order (yellow form or pink MOST form) Yellow form placed in chart (order not valid for inpatient use)     Chief Complaint  Patient presents with  . Medical Management of Chronic Issues    Routine visit. Discuss need for DEXA, PNA, and TD/Tdap or exclude     HPI:  Pt is a 86 y.o. female seen today for medical management of chronic diseases.    BP ranges 110s to 160s systolic.  She is not having any edema or shortness of breath.  She has a history of recurrent UTIs and urinary frequency and is currently on Macrobid.  She does not have any bladder pain, dysuria, or fever at this time.  She was supposed to move to another facility but has remained at [facility] for reasons that are not clear.  She is ambulatory with a walker with gait instability and sometimes forgets to use it.  No recent injuries.  She attends activities and has just come back from painting a picture and is  quite pleasant.  Past Medical History:  Diagnosis Date  . Alzheimer disease (H[facility])   . Fall   . Osteoarthritis   . Pyelonephritis    History reviewed. No pertinent surgical history.  Allergies  Allergen Reactions  . Sulfa Antibiotics Rash    Rash to trunk, legs, neck, scalp, and arms    Outpatient Encounter Medications as of 11/24/2020  Medication Sig  . acetaminophen (TYLENOL) 500 MG tablet Take 1,000 mg by mouth 3 (three) times daily as needed.  . Cranberry (THERACRAN PO) Take 250 mg by mouth in the morning and at bedtime.  . D-Mannose POWD Take 4 capsules by mouth daily at 12 noon. 99%  . docusate sodium (COLACE) 100 MG capsule Take 100 mg by mouth 2 (two) times daily as needed.   Marland Kitchen estradiol (ESTRING) 2 MG vaginal ring Place 2 mg vaginally every 3 (three) months. follow package directions  . ibuprofen (ADVIL) 200 MG tablet Take 400 mg by mouth 3 (three) times daily as needed.  . Nitrofurantoin Monohyd Macro (MACROBID PO) Take 50 mg by mouth in the morning and at bedtime.   . senna (SENOKOT) 8.6 MG tablet Take 2 tablets by mouth in the morning and at bedtime.  . tamsulosin (FLOMAX) 0.4 MG CAPS capsule Take 0.4 mg by mouth at bedtime.  . [DISCONTINUED] D-MANNOSE PO Take 300 g by mouth.   No facility-administered encounter medications on file as of 11/24/2020.    Review of Systems  Constitutional: Negative for activity change, appetite change, chills, diaphoresis, fatigue, fever and unexpected  weight change.  HENT: Negative for congestion.   Respiratory: Negative for cough, shortness of breath and wheezing.   Cardiovascular: Negative for chest pain, palpitations and leg swelling.  Gastrointestinal: Negative for abdominal distention, abdominal pain, constipation and diarrhea.  Genitourinary: Positive for frequency. Negative for difficulty urinating and dysuria.  Musculoskeletal: Positive for gait problem. Negative for arthralgias, back pain, joint swelling and myalgias.   Neurological: Positive for dizziness. Negative for tremors, seizures, syncope, facial asymmetry, speech difficulty, weakness, light-headedness, numbness and headaches.  Psychiatric/Behavioral: Positive for confusion. Negative for agitation and behavioral problems.    Immunization History  Administered Date(s) Administered  . Influenza-Unspecified 08/05/2018, 09/29/2018, 07/02/2019, 08/21/2019, 08/11/2020  . Moderna SARS-COV2 Booster Vaccination 08/31/2020  . Moderna Sars-Covid-2 Vaccination 11/04/2019, 12/02/2019  . Pneumococcal Conjugate-13 09/28/2018   Pertinent  Health Maintenance Due  Topic Date Due  . DEXA SCAN  Never done  . PNA vac Low Risk Adult (2 of 2 - PPSV23) 09/29/2019  . INFLUENZA VACCINE  Completed   Fall Risk  07/19/2020  Falls in the past year? 1  Number falls in past yr: 0  Injury with Fall? 0   Functional Status Survey:    Vitals:   11/24/20 1131  BP: (!) 155/79  Pulse: 64  Temp: 98.2 F (36.8 C)  SpO2: 96%  Weight: 151 lb (68.5 kg)  Height: 5\' 1"  (1.549 m)   Body mass index is 28.53 kg/m. Physical Exam Vitals and nursing note reviewed.  Constitutional:      General: She is not in acute distress.    Appearance: She is not diaphoretic.  HENT:     Head: Normocephalic and atraumatic.  Neck:     Vascular: No JVD.  Cardiovascular:     Rate and Rhythm: Normal rate and regular rhythm.     Heart sounds: No murmur heard.   Pulmonary:     Effort: Pulmonary effort is normal. No respiratory distress.     Breath sounds: Normal breath sounds. No wheezing.  Abdominal:     General: Abdomen is flat. Bowel sounds are normal. There is no distension.     Palpations: Abdomen is soft.     Tenderness: There is no abdominal tenderness.  Musculoskeletal:     Right lower leg: No edema.     Left lower leg: No edema.  Skin:    General: Skin is warm and dry.  Neurological:     General: No focal deficit present.     Mental Status: She is alert. Mental status is  at baseline.     Comments: Oriented x 2  Psychiatric:        Mood and Affect: Mood normal.     Labs reviewed: Recent Labs    09/28/20 0000 10/18/20 0000 10/25/20 0000  NA 133* 137 143  K 4.4 4.1 5.5*  CL 96* 99 106  CO2 27* 24* 24*  BUN 22* 19 13  CREATININE 0.8 0.6 0.7  CALCIUM 10.0 9.7 9.3   Recent Labs    07/20/20 0000  AST 18  ALT 13  ALBUMIN 4.2   Recent Labs    07/20/20 0000 10/18/20 0000  WBC 6.4 7.7  HGB 14.8 11.6*  HCT 44 34*  PLT 225 275   Lab Results  Component Value Date   TSH 1.13 07/20/2020   No results found for: HGBA1C Lab Results  Component Value Date   CHOL 235 (A) 07/20/2020   HDL 100 (A) 07/20/2020   LDLCALC 134 07/20/2020   TRIG 102  07/20/2020    Significant Diagnostic Results in last 30 days:  No results found.  Assessment/Plan  1. Primary hypertension Improved. Was on hctz which was stopped due to hyponatremia.Some numbers are slightly elevate but given her fall risk would not treat aggressively   2. Late onset Alzheimer's dementia without behavioral disturbance (HCC) MMSE 07/21/20 21/30 Moderate Appropriate for skilled care due to her dementia and incontinence   3. Balance problem Recommend having her walker with her at all times which is difficult as she forgets  4. Slow transit constipation Continue senokot 2 tabs bid   5. Localized edema Improved   6. Spinal stenosis, unspecified spinal region Denies pain Continue Tylenol and ibuprofen as needed only  7. Recurrent UTI No current symptoms Continue macrobid 50 mg bid    Will recommend dexa, tdap, and pneumovax if not already done. Staff to help find records.   Family/ staff Communication: discussed with the nurse   Labs/tests ordered:  BMP Monday 2/7 due to slightly elevated K with last check

## 2020-11-27 LAB — BASIC METABOLIC PANEL
BUN: 13 (ref 4–21)
BUN: 13 (ref 4–21)
CO2: 24 — AB (ref 13–22)
CO2: 24 — AB (ref 13–22)
Chloride: 102 (ref 99–108)
Chloride: 102 (ref 99–108)
Creatinine: 0.7 (ref 0.5–1.1)
Creatinine: 0.7 (ref 0.5–1.1)
Glucose: 88
Glucose: 88
Potassium: 4.2 (ref 3.4–5.3)
Potassium: 4.2 (ref 3.4–5.3)
Sodium: 141 (ref 137–147)
Sodium: 141 (ref 137–147)

## 2020-11-27 LAB — COMPREHENSIVE METABOLIC PANEL
Calcium: 9.2 (ref 8.7–10.7)
Calcium: 9.2 (ref 8.7–10.7)

## 2020-12-05 LAB — BASIC METABOLIC PANEL
BUN: 17 (ref 4–21)
CO2: 26 — AB (ref 13–22)
Chloride: 106 (ref 99–108)
Creatinine: 0.6 (ref 0.5–1.1)
Glucose: 98
Potassium: 4.4 (ref 3.4–5.3)
Sodium: 144 (ref 137–147)

## 2020-12-05 LAB — COMPREHENSIVE METABOLIC PANEL: Calcium: 9.1 (ref 8.7–10.7)

## 2020-12-11 ENCOUNTER — Encounter: Payer: Self-pay | Admitting: Internal Medicine

## 2020-12-13 ENCOUNTER — Encounter: Payer: Self-pay | Admitting: *Deleted

## 2020-12-28 ENCOUNTER — Non-Acute Institutional Stay (SKILLED_NURSING_FACILITY): Payer: Medicare Other | Admitting: Adult Health

## 2020-12-28 DIAGNOSIS — F0280 Dementia in other diseases classified elsewhere without behavioral disturbance: Secondary | ICD-10-CM

## 2020-12-28 DIAGNOSIS — G301 Alzheimer's disease with late onset: Secondary | ICD-10-CM

## 2020-12-28 DIAGNOSIS — M4800 Spinal stenosis, site unspecified: Secondary | ICD-10-CM

## 2020-12-28 DIAGNOSIS — R2689 Other abnormalities of gait and mobility: Secondary | ICD-10-CM | POA: Diagnosis not present

## 2020-12-28 DIAGNOSIS — N390 Urinary tract infection, site not specified: Secondary | ICD-10-CM

## 2020-12-28 DIAGNOSIS — I10 Essential (primary) hypertension: Secondary | ICD-10-CM

## 2020-12-28 DIAGNOSIS — Z23 Encounter for immunization: Secondary | ICD-10-CM

## 2020-12-28 NOTE — Progress Notes (Signed)
Location:  Medical illustrator of Service:  SNF ([id number]) Provider:  Peggye Ley, ANP [facility] [phone]   [name], DO  Patient Care Team: [name], DO as PCP - General (Geriatric Medicine) Jamison Neighbor, MD (Urology)  Extended Emergency Contact Information Primary Emergency Contact: Leslye, Puccini Home Phone: [phone] Work Phone: [phone] Mobile Phone: [phone] Relation: Son Secondary Emergency Contact: Corinna Lines Work Phone: [phone] Mobile Phone: [phone] Relation: Spouse  Code Status:  DNR Goals of care: Advanced Directive information Advanced Directives 11/24/2020  Does Patient Have a Medical Advance Directive? Yes  Type of Estate agent of [place];Out of facility DNR (pink MOST or yellow form)  Does patient want to make changes to medical advance directive? No - Patient declined  Copy of Healthcare Power of Attorney in Chart? Yes - validated most recent copy scanned in chart (See row information)  Pre-existing out of facility DNR order (yellow form or pink MOST form) Yellow form placed in chart (order not valid for inpatient use)     Chief Complaint  Patient presents with  . Medical Management of Chronic Issues    HPI:  Pt is a 86 y.o. female seen today for medical management of chronic diseases.    Cheryl Monroe continues in skilled care. She remains ambulatory with an unsteady gain and uses her walker. She forgets it frequently when she gets up in the room. She has incontinence and needs assistance from the nursing staff. No issues with delusions or behaviors are reported. Started on losartan for HTN with improvement noted in bp.  She was taken to urgent care last month and treated with Keflex for a UTI. She has a hx of recurrent UTI. No current dysuria, fever, bladder pain, or frequency reported. She states her back hurts in the lumbar area and feels better when she sits down.  No radicular pain.  Past Medical History:  Diagnosis Date  . Alzheimer disease (HCC)   . Fall   . Osteoarthritis   . Pyelonephritis    History reviewed. No pertinent surgical history.  Allergies  Allergen Reactions  . Sulfa Antibiotics Rash    Rash to trunk, legs, neck, scalp, and arms    Outpatient Encounter Medications as of 12/28/2020  Medication Sig  . losartan (COZAAR) 25 MG tablet Take 25 mg by mouth daily.  Marland Kitchen acetaminophen (TYLENOL) 500 MG tablet Take 1,000 mg by mouth 3 (three) times daily as needed.  . Cranberry (THERACRAN PO) Take 250 mg by mouth in the morning and at bedtime.  . D-Mannose POWD Take 4 capsules by mouth daily at 12 noon. 99%  . docusate sodium (COLACE) 100 MG capsule Take 100 mg by mouth 2 (two) times daily as needed.   Marland Kitchen estradiol (ESTRING) 2 MG vaginal ring Place 2 mg vaginally every 3 (three) months. follow package directions  . ibuprofen (ADVIL) 200 MG tablet Take 400 mg by mouth 3 (three) times daily as needed.  . Nitrofurantoin Monohyd Macro (MACROBID PO) Take 50 mg by mouth in the morning and at bedtime.   . senna (SENOKOT) 8.6 MG tablet Take 2 tablets by mouth in the morning and at bedtime.  . tamsulosin (FLOMAX) 0.4 MG CAPS capsule Take 0.4 mg by mouth at bedtime.   No facility-administered encounter medications on file as of 12/28/2020.    Review of Systems  Constitutional: Negative for activity change, appetite change, chills, diaphoresis, fatigue, fever and unexpected weight change.  HENT:  Negative for congestion.   Respiratory: Negative for cough, shortness of breath and wheezing.   Cardiovascular: Negative for chest pain, palpitations and leg swelling.  Gastrointestinal: Negative for abdominal distention, abdominal pain, constipation and diarrhea.  Genitourinary: Positive for frequency. Negative for difficulty urinating and dysuria.  Musculoskeletal: Positive for back pain and gait problem. Negative for arthralgias, joint swelling and  myalgias.  Neurological: Negative for dizziness, tremors, seizures, syncope, facial asymmetry, speech difficulty, weakness, light-headedness, numbness and headaches.  Psychiatric/Behavioral: Positive for confusion. Negative for agitation and behavioral problems.    Immunization History  Administered Date(s) Administered  . Influenza-Unspecified 08/05/2018, 09/29/2018, 07/02/2019, 08/21/2019, 08/11/2020  . Moderna SARS-COV2 Booster Vaccination 08/31/2020  . Moderna Sars-Covid-2 Vaccination 11/04/2019, 12/02/2019  . Pneumococcal Conjugate-13 09/28/2018  . Pneumococcal Polysaccharide-23 10/21/2017   Pertinent  Health Maintenance Due  Topic Date Due  . DEXA SCAN  Never done  . INFLUENZA VACCINE  Completed  . PNA vac Low Risk Adult  Completed   Fall Risk  07/19/2020  Falls in the past year? 1  Number falls in past yr: 0  Injury with Fall? 0   Functional Status Survey:    There were no vitals filed for this visit. There is no height or weight on file to calculate BMI. Physical Exam Vitals and nursing note reviewed.  Constitutional:      General: She is not in acute distress.    Appearance: She is not diaphoretic.  HENT:     Head: Normocephalic and atraumatic.  Neck:     Vascular: No JVD.  Cardiovascular:     Rate and Rhythm: Normal rate and regular rhythm.     Heart sounds: No murmur heard.   Pulmonary:     Effort: Pulmonary effort is normal. No respiratory distress.     Breath sounds: Normal breath sounds. No wheezing.  Abdominal:     General: Bowel sounds are normal. There is no distension.     Palpations: Abdomen is soft.  Musculoskeletal:     Comments: Trace edema BLE  Skin:    General: Skin is warm and dry.  Neurological:     General: No focal deficit present.     Mental Status: She is alert. Mental status is at baseline.     Comments: Oriented to self and place.      Labs reviewed: Recent Labs    10/25/20 0000 11/27/20 0000 12/05/20 0000  NA 143 141   141 144  K 5.5* 4.2  4.2 4.4  CL 106 102  102 106  CO2 24* 24*  24* 26*  BUN 13 13  13 17   CREATININE 0.7 0.7  0.7 0.6  CALCIUM 9.3 9.2  9.2 9.1   Recent Labs    07/20/20 0000  AST 18  ALT 13  ALBUMIN 4.2   Recent Labs    07/20/20 0000 10/18/20 0000  WBC 6.4 7.7  HGB 14.8 11.6*  HCT 44 34*  PLT 225 275   Lab Results  Component Value Date   TSH 1.13 07/20/2020   No results found for: HGBA1C Lab Results  Component Value Date   CHOL 235 (A) 07/20/2020   HDL 100 (A) 07/20/2020   LDLCALC 134 07/20/2020   TRIG 102 07/20/2020    Significant Diagnostic Results in last 30 days:  No results found.  Assessment/Plan 1. Essential hypertension Improved with losartan 25 mg qd   2. Late onset Alzheimer's dementia without behavioral disturbance (HCC) MMSE 21/30 07/21/20 Progressive decline in cognition and physical  function c/w the disease. Continue supportive care in the skilled environment.   3. Recurrent UTI Completed a course of Keflex for UTI No current symptoms Continues on macrobid 50 mg bid per urology for UTI prevention   4. Balance problem Needs to have her walker with her at all times but forgets due to her dementia Needs frequent supervision and skilled care   5. Spinal stenosis, unspecified spinal region Uses a supportive pillow in the chair Tylenol 1000 mg tid prn pain   6. Immunization due T dap ordered   Family/ staff Communication: nurse  Labs/tests ordered:  NA

## 2020-12-29 ENCOUNTER — Encounter: Payer: Self-pay | Admitting: Adult Health

## 2021-01-22 ENCOUNTER — Encounter: Payer: Self-pay | Admitting: Internal Medicine

## 2021-01-22 ENCOUNTER — Non-Acute Institutional Stay (SKILLED_NURSING_FACILITY): Payer: Medicare Other | Admitting: Internal Medicine

## 2021-01-22 DIAGNOSIS — F0280 Dementia in other diseases classified elsewhere without behavioral disturbance: Secondary | ICD-10-CM

## 2021-01-22 DIAGNOSIS — R2689 Other abnormalities of gait and mobility: Secondary | ICD-10-CM | POA: Diagnosis not present

## 2021-01-22 DIAGNOSIS — I10 Essential (primary) hypertension: Secondary | ICD-10-CM

## 2021-01-22 DIAGNOSIS — G301 Alzheimer's disease with late onset: Secondary | ICD-10-CM

## 2021-01-22 DIAGNOSIS — N390 Urinary tract infection, site not specified: Secondary | ICD-10-CM | POA: Diagnosis not present

## 2021-01-22 DIAGNOSIS — K5901 Slow transit constipation: Secondary | ICD-10-CM

## 2021-01-22 NOTE — Progress Notes (Signed)
Location:   [facility] Room Number: 140 Place of Service:  SNF [phone]) Provider:  Einar Crow MD  [name], DO  Patient Care Team: [name], DO as PCP - General (Geriatric Medicine) Jamison Neighbor, MD (Urology)  Extended Emergency Contact Information Primary Emergency Contact: Alvino Blood Home Phone: [phone] Work Phone: [phone] Mobile Phone: [phone] Relation: Son Secondary Emergency Contact: Corinna Lines Work Phone: [phone] Mobile Phone: [phone] Relation: Spouse  Code Status:   Goals of care: Advanced Directive information Advanced Directives 11/24/2020  Does Patient Have a Medical Advance Directive? Yes  Type of Estate agent of [place];Out of facility DNR (pink MOST or yellow form)  Does patient want to make changes to medical advance directive? No - Patient declined  Copy of Healthcare Power of Attorney in Chart? Yes - validated most recent copy scanned in chart (See row information)  Pre-existing out of facility DNR order (yellow form or pink MOST form) Yellow form placed in chart (order not valid for inpatient use)     Chief Complaint  Patient presents with  . Medical Management of Chronic Issues  . Health Maintenance    TDAP, Dexa scan    HPI:  Pt is a 86 y.o. female seen today for medical management of chronic diseases.    Patient has a history of Alzheimer's dementia, hypertension, orthostatic hypotension She also has history of recurrent UTIs and urinary incontinence.  Patient was seen in her room today. She did not have any acute complaints She has gained weight since last visit by Dr. Renato Gails few months ago Uses walker to walk but was not using it inside the room did have some unstable gait. Was pleasantly confused. No recent falls.  Past Medical History:  Diagnosis Date  . Alzheimer disease (HCC)   . Fall   . Osteoarthritis   . Pyelonephritis     History reviewed. No pertinent surgical history.  Allergies  Allergen Reactions  . Sulfa Antibiotics Rash    Rash to trunk, legs, neck, scalp, and arms    Allergies as of 01/22/2021      Reactions   Sulfa Antibiotics Rash   Rash to trunk, legs, neck, scalp, and arms      Medication List       Accurate as of January 22, 2021  2:25 PM. If you have any questions, ask your nurse or doctor.        acetaminophen 500 MG tablet Commonly known as: TYLENOL Take 1,000 mg by mouth 3 (three) times daily as needed.   D-Mannose Powd Take 4 capsules by mouth daily at 12 noon. 99%   docusate sodium 100 MG capsule Commonly known as: COLACE Take 100 mg by mouth 2 (two) times daily as needed.   estradiol 2 MG vaginal ring Commonly known as: ESTRING Place 2 mg vaginally every 3 (three) months. follow package directions   ibuprofen 200 MG tablet Commonly known as: ADVIL Take 400 mg by mouth 3 (three) times daily as needed.   losartan 25 MG tablet Commonly known as: COZAAR Take 25 mg by mouth daily.   MACROBID PO Take 50 mg by mouth in the morning and at bedtime.   senna 8.6 MG tablet Commonly known as: SENOKOT Take 2 tablets by mouth in the morning and at bedtime.   tamsulosin 0.4 MG Caps capsule Commonly known as: FLOMAX Take 0.4 mg by mouth at bedtime.   THERACRAN PO Take 250 mg by mouth in  the morning and at bedtime.       Review of Systems  Unable to perform ROS: Dementia    Immunization History  Administered Date(s) Administered  . Influenza-Unspecified 08/05/2018, 09/29/2018, 07/02/2019, 08/21/2019, 08/11/2020  . Moderna SARS-COV2 Booster Vaccination 08/31/2020  . Moderna Sars-Covid-2 Vaccination 11/04/2019, 12/02/2019  . Pneumococcal Conjugate-13 09/28/2018  . Pneumococcal Polysaccharide-23 10/21/2017   Pertinent  Health Maintenance Due  Topic Date Due  . DEXA SCAN  Never done  . INFLUENZA VACCINE  05/21/2021  . PNA vac Low Risk Adult  Completed   Fall  Risk  07/19/2020  Falls in the past year? 1  Number falls in past yr: 0  Injury with Fall? 0   Functional Status Survey:    Vitals:   01/22/21 1422  BP: (!) 169/78  Pulse: 63  Resp: 17  Temp: 97.9 F (36.6 C)  SpO2: 93%  Weight: 158 lb (71.7 kg)  Height: 5\' 1"  (1.549 m)   Body mass index is 29.85 kg/m. Physical Exam  Constitutional:  Well-developed and well-nourished.  HENT:  Head: Normocephalic.  Mouth/Throat: Oropharynx is clear and moist.  Eyes: Pupils are equal, round, and reactive to light.  Neck: Neck supple.  Cardiovascular: Normal rate and normal heart sounds.  No murmur heard. Pulmonary/Chest: Effort normal and breath sounds normal. No respiratory distress. No wheezes. She has no rales.  Abdominal: Soft. Bowel sounds are normal. No distension. There is no tenderness. There is no rebound.  Musculoskeletal:Mild Edema Bilateral Lymphadenopathy: none Neurological: Very Pleasant Had Aphasia but could name Objects Walks without Assist but does have Unstable gait Walker in the room Skin: Skin is warm and dry.  Psychiatric: Normal mood and affect. Behavior is normal. Thought content normal.    Labs reviewed: Recent Labs    10/25/20 0000 11/27/20 0000 12/05/20 0000  NA 143 141  141 144  K 5.5* 4.2  4.2 4.4  CL 106 102  102 106  CO2 24* 24*  24* 26*  BUN 13 13  13 17   CREATININE 0.7 0.7  0.7 0.6  CALCIUM 9.3 9.2  9.2 9.1   Recent Labs    07/20/20 0000  AST 18  ALT 13  ALBUMIN 4.2   Recent Labs    07/20/20 0000 10/18/20 0000  WBC 6.4 7.7  HGB 14.8 11.6*  HCT 44 34*  PLT 225 275   Lab Results  Component Value Date   TSH 1.13 07/20/2020   No results found for: HGBA1C Lab Results  Component Value Date   CHOL 235 (A) 07/20/2020   HDL 100 (A) 07/20/2020   LDLCALC 134 07/20/2020   TRIG 102 07/20/2020    Significant Diagnostic Results in last 30 days:  No results found.  Assessment/Plan Essential hypertension BP Mildily elevated  today But Other readings in Matrix are in good limit Will Continue Cozaar  Late onset Alzheimer's dementia without behavioral disturbance (HCC) Staying stable Not on Any Meds ? Recurrent UTI with Incontinence On Estrogen Vaginal Ring Also On Prophylactic Nitrofurantoin and Flomax Balance problem Uses walker Slow transit constipation Doing well on Senna Anemia Per Dr 07/22/2020 Note Iron studies were done Will try to follow Repeat CBC would be needed   Family/ staff Communication:   Labs/tests ordered:

## 2021-01-30 ENCOUNTER — Encounter: Payer: Self-pay | Admitting: Orthopedic Surgery

## 2021-02-01 NOTE — Progress Notes (Signed)
Error

## 2021-02-22 ENCOUNTER — Non-Acute Institutional Stay (SKILLED_NURSING_FACILITY): Payer: Medicare Other | Admitting: Adult Health

## 2021-02-22 ENCOUNTER — Encounter: Payer: Self-pay | Admitting: Adult Health

## 2021-02-22 DIAGNOSIS — I10 Essential (primary) hypertension: Secondary | ICD-10-CM | POA: Diagnosis not present

## 2021-02-22 DIAGNOSIS — Z66 Do not resuscitate: Secondary | ICD-10-CM

## 2021-02-22 DIAGNOSIS — R4789 Other speech disturbances: Secondary | ICD-10-CM | POA: Diagnosis not present

## 2021-02-22 DIAGNOSIS — G301 Alzheimer's disease with late onset: Secondary | ICD-10-CM

## 2021-02-22 DIAGNOSIS — M4800 Spinal stenosis, site unspecified: Secondary | ICD-10-CM

## 2021-02-22 DIAGNOSIS — N3941 Urge incontinence: Secondary | ICD-10-CM

## 2021-02-22 DIAGNOSIS — N390 Urinary tract infection, site not specified: Secondary | ICD-10-CM | POA: Diagnosis not present

## 2021-02-22 DIAGNOSIS — F0280 Dementia in other diseases classified elsewhere without behavioral disturbance: Secondary | ICD-10-CM

## 2021-02-22 NOTE — Progress Notes (Signed)
Location:    [facility] Room Number: 140 Place of Service:  SNF [phone]) Provider:  Fletcher Anon, NP  Mahlon Gammon, MD  Patient Care Team: Mahlon Gammon, MD as PCP - General (Internal Medicine) Jamison Neighbor, MD (Urology)  Extended Emergency Contact Information Primary Emergency Contact: Alvino Blood Home Phone: [phone] Work Phone: [phone] Mobile Phone: [phone] Relation: Son Secondary Emergency Contact: Corinna Lines Work Phone: [phone] Mobile Phone: [phone] Relation: Spouse  Code Status:  DNR Goals of care: Advanced Directive information Advanced Directives 02/22/2021  Does Patient Have a Medical Advance Directive? Yes  Type of Advance Directive Living will;Out of facility DNR (pink MOST or yellow form)  Does patient want to make changes to medical advance directive? No - Patient declined  Copy of Healthcare Power of Attorney in Chart? -  Pre-existing out of facility DNR order (yellow form or pink MOST form) -     Chief Complaint  Patient presents with  . Medical Management of Chronic Issues  . Health Maintenance    TDAP, Dexa    HPI:  Pt is a 86 y.o. female seen today for medical management of chronic diseases.    Continues in skilled care due to AD with prominent feature of word finding.  Remains ambulatory with a walker and has some low back pain with documented hx of spinal stenosis. No recent tylenol or ibuprofen needed.   She was seen by urgent care and treated for a UTI with Augmentin. She has a hx of neurogenic bladder and recurrent UTI and has had urosepsis before. Currently she denies any urinary symptoms. Continues with chronic incontinence and is on a toilet schedule.    BP range 91-151  She typically says each visit "I feel tired and dizzy". She is able to verbalize but is confused and not able to contribute to the hx. She seems alert and pleasant, not toxic for the visit.   Past Medical  History:  Diagnosis Date  . Alzheimer disease (HCC)   . Fall   . Osteoarthritis   . Pyelonephritis    History reviewed. No pertinent surgical history.  Allergies  Allergen Reactions  . Sulfa Antibiotics Rash    Rash to trunk, legs, neck, scalp, and arms    Allergies as of 02/22/2021      Reactions   Sulfa Antibiotics Rash   Rash to trunk, legs, neck, scalp, and arms      Medication List       Accurate as of Feb 22, 2021 10:49 AM. If you have any questions, ask your nurse or doctor.        acetaminophen 500 MG tablet Commonly known as: TYLENOL Take 1,000 mg by mouth 3 (three) times daily as needed.   D-Mannose Powd Take 4 capsules by mouth daily at 12 noon. 99%   docusate sodium 100 MG capsule Commonly known as: COLACE Take 100 mg by mouth 2 (two) times daily as needed.   estradiol 2 MG vaginal ring Commonly known as: ESTRING Place 2 mg vaginally every 3 (three) months. follow package directions   ibuprofen 200 MG tablet Commonly known as: ADVIL Take 400 mg by mouth 3 (three) times daily as needed.   losartan 25 MG tablet Commonly known as: COZAAR Take 25 mg by mouth daily.   MACROBID PO Take 50 mg by mouth in the morning and at bedtime.   senna 8.6 MG tablet Commonly known as: SENOKOT Take 2 tablets by mouth in the  morning and at bedtime.   tamsulosin 0.4 MG Caps capsule Commonly known as: FLOMAX Take 0.4 mg by mouth at bedtime.   THERACRAN PO Take 250 mg by mouth in the morning and at bedtime.       Review of Systems  Constitutional: Negative for activity change, appetite change, chills, diaphoresis, fatigue, fever and unexpected weight change.  HENT: Negative for congestion.   Respiratory: Negative for cough, shortness of breath and wheezing.   Cardiovascular: Positive for leg swelling. Negative for chest pain and palpitations.  Gastrointestinal: Negative for abdominal distention, abdominal pain, constipation and diarrhea.  Genitourinary:  Negative for difficulty urinating, dysuria, hematuria, pelvic pain and urgency.  Musculoskeletal: Positive for gait problem. Negative for arthralgias, back pain, joint swelling and myalgias.  Neurological: Negative for dizziness, tremors, seizures, syncope, facial asymmetry, speech difficulty, weakness, light-headedness, numbness and headaches.  Psychiatric/Behavioral: Positive for confusion. Negative for agitation and behavioral problems.    Immunization History  Administered Date(s) Administered  . Influenza-Unspecified 08/05/2018, 09/29/2018, 07/02/2019, 08/21/2019, 08/11/2020  . Moderna SARS-COV2 Booster Vaccination 08/31/2020  . Moderna Sars-Covid-2 Vaccination 11/04/2019, 12/02/2019  . Pneumococcal Conjugate-13 09/28/2018  . Pneumococcal Polysaccharide-23 10/21/2017   Pertinent  Health Maintenance Due  Topic Date Due  . DEXA SCAN  Never done  . INFLUENZA VACCINE  05/21/2021  . PNA vac Low Risk Adult  Completed   Fall Risk  07/19/2020  Falls in the past year? 1  Number falls in past yr: 0  Injury with Fall? 0   Functional Status Survey:    Vitals:   02/22/21 1033  BP: (!) 147/77  Pulse: 60  Resp: 18  Temp: (!) 97 F (36.1 C)  SpO2: 98%  Weight: 152 lb 3.2 oz (69 kg)  Height: 5\' 1"  (1.549 m)   Body mass index is 28.76 kg/m. Physical Exam Vitals and nursing note reviewed.  Constitutional:      General: She is not in acute distress.    Appearance: She is not diaphoretic.  HENT:     Head: Normocephalic and atraumatic.     Mouth/Throat:     Mouth: Mucous membranes are moist.     Pharynx: Oropharynx is clear. No oropharyngeal exudate.  Neck:     Vascular: No JVD.  Cardiovascular:     Rate and Rhythm: Normal rate and regular rhythm.     Heart sounds: No murmur heard.   Pulmonary:     Effort: Pulmonary effort is normal. No respiratory distress.     Breath sounds: Normal breath sounds. No wheezing.  Abdominal:     General: Bowel sounds are normal. There is no  distension.     Palpations: Abdomen is soft.     Tenderness: There is no abdominal tenderness.  Musculoskeletal:     Cervical back: No rigidity or tenderness.     Comments: BLE trace edema L>R  Lymphadenopathy:     Cervical: No cervical adenopathy.  Skin:    General: Skin is warm and dry.  Neurological:     General: No focal deficit present.     Mental Status: She is alert. Mental status is at baseline.  Psychiatric:        Mood and Affect: Mood normal.     Labs reviewed: Recent Labs    10/25/20 0000 11/27/20 0000 12/05/20 0000  NA 143 141  141 144  K 5.5* 4.2  4.2 4.4  CL 106 102  102 106  CO2 24* 24*  24* 26*  BUN 13 13  13  17  CREATININE 0.7 0.7  0.7 0.6  CALCIUM 9.3 9.2  9.2 9.1   Recent Labs    07/20/20 0000  AST 18  ALT 13  ALBUMIN 4.2   Recent Labs    07/20/20 0000 10/18/20 0000  WBC 6.4 7.7  HGB 14.8 11.6*  HCT 44 34*  PLT 225 275   Lab Results  Component Value Date   TSH 1.13 07/20/2020   No results found for: HGBA1C Lab Results  Component Value Date   CHOL 235 (A) 07/20/2020   HDL 100 (A) 07/20/2020   LDLCALC 134 07/20/2020   TRIG 102 07/20/2020    Significant Diagnostic Results in last 30 days:  No results found.  Assessment/Plan 1. Late onset Alzheimer's dementia without behavioral disturbance (HCC) Moderate MMSE 21/30 Not currently on meds for this issue and would not likely benefit due to skilled care status.  Moderate stage  2. Word finding difficulty Noted, frustrating for her at times   3. Recurrent UTI Takes macrobid for prevention along with flomax to help repent retention Would avoid frequent UAs unless symptoms present to avoid treatment of asymptomatic bacteruria   4. Essential hypertension Controlled Continue losartan 25 mg qd   5. Urge incontinence Toilet q 2 hr  6. Spinal stenosis, unspecified spinal region No imaging for review at this time Has tylenol and ibuprofen ordered prn with no complaints at  this time   Up date DNR order in epic  Family/ staff Communication: nurse   Labs/tests ordered:  Hold off on dexa scan at this time due to skilled care status.  Tdap was ordered on 3/10 checking to see if the pt received it

## 2021-03-22 ENCOUNTER — Non-Acute Institutional Stay (SKILLED_NURSING_FACILITY): Payer: Medicare Other | Admitting: Adult Health

## 2021-03-22 ENCOUNTER — Encounter: Payer: Self-pay | Admitting: Adult Health

## 2021-03-22 DIAGNOSIS — N390 Urinary tract infection, site not specified: Secondary | ICD-10-CM | POA: Diagnosis not present

## 2021-03-22 DIAGNOSIS — Z7409 Other reduced mobility: Secondary | ICD-10-CM

## 2021-03-22 DIAGNOSIS — F0280 Dementia in other diseases classified elsewhere without behavioral disturbance: Secondary | ICD-10-CM

## 2021-03-22 DIAGNOSIS — D649 Anemia, unspecified: Secondary | ICD-10-CM | POA: Diagnosis not present

## 2021-03-22 DIAGNOSIS — G301 Alzheimer's disease with late onset: Secondary | ICD-10-CM | POA: Diagnosis not present

## 2021-03-22 DIAGNOSIS — I10 Essential (primary) hypertension: Secondary | ICD-10-CM

## 2021-03-22 NOTE — Progress Notes (Signed)
Location:  [facility] Room Number: 140-A Place of Service:  SNF [phone]) Provider:  Fletcher Anon, NP     Patient Care Team: Mahlon Gammon, MD as PCP - General (Internal Medicine) Jamison Neighbor, MD (Urology)  Extended Emergency Contact Information Primary Emergency Contact: Alvino Blood Home Phone: [phone] Work Phone: [phone] Mobile Phone: [phone] Relation: Son Secondary Emergency Contact: Corinna Lines Work Phone: [phone] Mobile Phone: [phone] Relation: Spouse  Code Status:  DNR  Goals of care: Advanced Directive information Advanced Directives 03/22/2021  Does Patient Have a Medical Advance Directive? Yes  Type of Advance Directive Living will;Out of facility DNR (pink MOST or yellow form)  Does patient want to make changes to medical advance directive? No - Patient declined  Copy of Healthcare Power of Attorney in Chart? -  Pre-existing out of facility DNR order (yellow form or pink MOST form) Yellow form placed in chart (order not valid for inpatient use)     Chief Complaint  Patient presents with  . Medical Management of Chronic Issues    Routine visit and discuss need for shingrix or exclude     HPI:  Pt is a 86 y.o. female seen today for medical management of chronic diseases.   PMH significant for Alzheimer's disease, osteoarthritis, pyelonephritis, low back pain, HTN, atrophic vaginitis, UTI recurrent, and urinary retention.  BP ranging 128-155.    Denies any symptoms of dysuria, back pain, frequency , etc  Dementia: remains ambulatory with a walker but frequently forgets to use it. Pleasant. Notes in matrix indicate that she refuses meds at times.  MMSE 21/30 07/21/20 Past Medical History:  Diagnosis Date  . Alzheimer disease (HCC)   . Balance problem 07/19/2020  . Constipation 07/19/2020  . Dementia without behavioral disturbance (HCC) 07/19/2020   MMSE 21/30 07/21/20  . Fall   . Osteoarthritis    . Pyelonephritis   . Spinal stenosis 07/19/2020  . Weakness of left lower extremity 07/19/2020   History reviewed. No pertinent surgical history.  Allergies  Allergen Reactions  . Sulfa Antibiotics Rash    Rash to trunk, legs, neck, scalp, and arms    Outpatient Encounter Medications as of 03/22/2021  Medication Sig  . acetaminophen (TYLENOL) 500 MG tablet Take 1,000 mg by mouth 3 (three) times daily as needed.  . Cranberry (THERACRAN PO) Take 250 mg by mouth in the morning and at bedtime.  . D-Mannose POWD Take 4 capsules by mouth daily at 12 noon. 99%  . docusate sodium (COLACE) 100 MG capsule Take 100 mg by mouth 2 (two) times daily as needed.   Marland Kitchen estradiol (ESTRING) 2 MG vaginal ring Place 2 mg vaginally every 3 (three) months. follow package directions  . ibuprofen (ADVIL) 200 MG tablet Take 400 mg by mouth 3 (three) times daily as needed.  Marland Kitchen losartan (COZAAR) 25 MG tablet Take 25 mg by mouth daily.  . Nitrofurantoin Monohyd Macro (MACROBID PO) Take 50 mg by mouth in the morning and at bedtime.   . senna (SENOKOT) 8.6 MG TABS tablet Take 2 tablets by mouth in the morning and at bedtime.  . tamsulosin (FLOMAX) 0.4 MG CAPS capsule Take 0.4 mg by mouth at bedtime.  . [DISCONTINUED] senna (SENOKOT) 8.6 MG tablet Take 2 tablets by mouth in the morning and at bedtime.   No facility-administered encounter medications on file as of 03/22/2021.    Review of Systems  Constitutional: Negative for activity change, appetite change, chills, diaphoresis, fatigue, fever and  unexpected weight change.  HENT: Negative for congestion.   Respiratory: Negative for cough, shortness of breath and wheezing.   Cardiovascular: Negative for chest pain, palpitations and leg swelling.  Gastrointestinal: Negative for abdominal distention, abdominal pain, constipation and diarrhea.  Genitourinary: Negative for difficulty urinating and dysuria.  Musculoskeletal: Positive for gait problem. Negative for  arthralgias, back pain, joint swelling and myalgias.  Neurological: Positive for dizziness. Negative for tremors, seizures, syncope, facial asymmetry, speech difficulty, weakness, light-headedness, numbness and headaches.  Psychiatric/Behavioral: Positive for confusion. Negative for agitation and behavioral problems.    Immunization History  Administered Date(s) Administered  . Influenza-Unspecified 08/05/2018, 09/29/2018, 07/02/2019, 08/21/2019, 08/11/2020  . Moderna SARS-COV2 Booster Vaccination 08/31/2020  . Moderna Sars-Covid-2 Vaccination 11/04/2019, 12/02/2019  . Pneumococcal Conjugate-13 09/28/2018  . Pneumococcal Polysaccharide-23 10/21/2017  . Tdap 02/23/2021   Pertinent  Health Maintenance Due  Topic Date Due  . INFLUENZA VACCINE  05/21/2021  . PNA vac Low Risk Adult  Completed  . DEXA SCAN  Discontinued   Fall Risk  07/19/2020  Falls in the past year? 1  Number falls in past yr: 0  Injury with Fall? 0   Functional Status Survey:    Vitals:   03/22/21 1140  BP: (!) 155/80  Pulse: 67  Resp: 17  Temp: (!) 97 F (36.1 C)  SpO2: 94%  Weight: 153 lb 3.2 oz (69.5 kg)  Height: 5\' 1"  (1.549 m)   Body mass index is 28.95 kg/m. Physical Exam Vitals and nursing note reviewed.  Constitutional:      General: She is not in acute distress.    Appearance: She is not diaphoretic.  HENT:     Head: Normocephalic and atraumatic.  Neck:     Vascular: No JVD.  Cardiovascular:     Rate and Rhythm: Normal rate and regular rhythm.     Heart sounds: No murmur heard.   Pulmonary:     Effort: Pulmonary effort is normal. No respiratory distress.     Breath sounds: Normal breath sounds. No wheezing.  Abdominal:     General: Bowel sounds are normal. There is no distension.     Palpations: Abdomen is soft.     Tenderness: There is no abdominal tenderness.  Musculoskeletal:     Cervical back: No rigidity or tenderness.     Right lower leg: No edema.     Left lower leg: No  edema.  Lymphadenopathy:     Cervical: No cervical adenopathy.  Skin:    General: Skin is warm and dry.  Neurological:     General: No focal deficit present.     Mental Status: She is alert. Mental status is at baseline.  Psychiatric:        Mood and Affect: Mood normal.     Labs reviewed: Recent Labs    10/25/20 0000 11/27/20 0000 12/05/20 0000  NA 143 141  141 144  K 5.5* 4.2  4.2 4.4  CL 106 102  102 106  CO2 24* 24*  24* 26*  BUN 13 13  13 17   CREATININE 0.7 0.7  0.7 0.6  CALCIUM 9.3 9.2  9.2 9.1   Recent Labs    07/20/20 0000  AST 18  ALT 13  ALBUMIN 4.2   Recent Labs    07/20/20 0000 10/18/20 0000  WBC 6.4 7.7  HGB 14.8 11.6*  HCT 44 34*  PLT 225 275   Lab Results  Component Value Date   TSH 1.13 07/20/2020   No  results found for: HGBA1C Lab Results  Component Value Date   CHOL 235 (A) 07/20/2020   HDL 100 (A) 07/20/2020   LDLCALC 134 07/20/2020   TRIG 102 07/20/2020    Significant Diagnostic Results in last 30 days:  No results found.  Assessment/Plan   1. Late onset Alzheimer's dementia without behavioral disturbance (HCC) MMSE 21/30 Appropriate for skilled care due to her dementia and incontinence  2. Recurrent UTI Macrodantin prophylaxis per urology   3. Impaired mobility Wide base gait with shuffling Fall risk, needs walker at all times.   4. Anemia, unspecified type Normocytic normochromic at last check Recheck BMP  5. Essential hypertension Continue losartan 25 mg qd    Will recommend shingrix.      Labs/tests ordered:  CBC BMP

## 2021-04-03 ENCOUNTER — Encounter: Payer: Self-pay | Admitting: Internal Medicine

## 2021-04-05 ENCOUNTER — Encounter: Payer: Self-pay | Admitting: Internal Medicine

## 2021-04-05 ENCOUNTER — Non-Acute Institutional Stay (SKILLED_NURSING_FACILITY): Payer: Medicare Other | Admitting: Internal Medicine

## 2021-04-05 DIAGNOSIS — F0280 Dementia in other diseases classified elsewhere without behavioral disturbance: Secondary | ICD-10-CM | POA: Diagnosis not present

## 2021-04-05 DIAGNOSIS — N390 Urinary tract infection, site not specified: Secondary | ICD-10-CM

## 2021-04-05 DIAGNOSIS — G301 Alzheimer's disease with late onset: Secondary | ICD-10-CM | POA: Diagnosis not present

## 2021-04-05 DIAGNOSIS — I10 Essential (primary) hypertension: Secondary | ICD-10-CM | POA: Diagnosis not present

## 2021-04-05 NOTE — Progress Notes (Signed)
Location:   [facility] Room Number: 140 Place of Service:  SNF [phone]) Provider:  Einar Crow MD  Mahlon Gammon, MD  Patient Care Team: Mahlon Gammon, MD as PCP - General (Internal Medicine) Jamison Neighbor, MD (Urology)  Extended Emergency Contact Information Primary Emergency Contact: Alvino Blood Home Phone: [phone] Work Phone: [phone] Mobile Phone: [phone] Relation: Son Secondary Emergency Contact: Corinna Lines Work Phone: [phone] Mobile Phone: [phone] Relation: Spouse  Code Status:  DNR Goals of care: Advanced Directive information Advanced Directives 04/05/2021  Does Patient Have a Medical Advance Directive? Yes  Type of Advance Directive Living will;Out of facility DNR (pink MOST or yellow form)  Does patient want to make changes to medical advance directive? No - Patient declined  Copy of Healthcare Power of Attorney in Chart? -  Pre-existing out of facility DNR order (yellow form or pink MOST form) Yellow form placed in chart (order not valid for inpatient use)     Chief Complaint  Patient presents with   Acute Visit    HPI:  Pt is a 86 y.o. female seen today for UTI   Patient has a history of Alzheimer's dementia, hypertension, orthostatic hypotension She also has history of recurrent UTIs and urinary incontinence.  Patient recently had UA and Culture done for increased confusion and Dysuria Her Urine is positive for > 100k of Klebsiella Patient seen in her room Pleasantly confused also has mild Aphasia Walks with her walker C/o Lower Abdominal discomfort No fever or chills Also c/o Weakness    Past Medical History:  Diagnosis Date   Alzheimer disease (HCC)    Balance problem 07/19/2020   Constipation 07/19/2020   Dementia without behavioral disturbance (HCC) 07/19/2020   MMSE 21/30 07/21/20   Fall    Osteoarthritis    Pyelonephritis    Spinal stenosis 07/19/2020   Weakness of left  lower extremity 07/19/2020   History reviewed. No pertinent surgical history.  Allergies  Allergen Reactions   Sulfa Antibiotics Rash    Rash to trunk, legs, neck, scalp, and arms    Allergies as of 04/05/2021       Reactions   Sulfa Antibiotics Rash   Rash to trunk, legs, neck, scalp, and arms        Medication List        Accurate as of April 05, 2021 11:59 PM. If you have any questions, ask your nurse or doctor.          acetaminophen 500 MG tablet Commonly known as: TYLENOL Take 1,000 mg by mouth 3 (three) times daily as needed.   D-Mannose Powd Take 4 capsules by mouth daily at 12 noon. 99%   docusate sodium 100 MG capsule Commonly known as: COLACE Take 100 mg by mouth 2 (two) times daily as needed.   estradiol 2 MG vaginal ring Commonly known as: ESTRING Place 2 mg vaginally every 3 (three) months. follow package directions   ibuprofen 200 MG tablet Commonly known as: ADVIL Take 400 mg by mouth 3 (three) times daily as needed.   losartan 25 MG tablet Commonly known as: COZAAR Take 25 mg by mouth daily.   MACROBID PO Take 50 mg by mouth in the morning and at bedtime.   senna 8.6 MG Tabs tablet Commonly known as: SENOKOT Take 2 tablets by mouth in the morning and at bedtime.   tamsulosin 0.4 MG Caps capsule Commonly known as: FLOMAX Take 0.4 mg by mouth at bedtime.  THERACRAN PO Take 250 mg by mouth in the morning and at bedtime.        Review of Systems  Unable to perform ROS: Dementia   Immunization History  Administered Date(s) Administered   Influenza-Unspecified 08/05/2018, 09/29/2018, 07/02/2019, 08/21/2019, 08/11/2020   Moderna SARS-COV2 Booster Vaccination 08/31/2020   Moderna Sars-Covid-2 Vaccination 11/04/2019, 12/02/2019   Pneumococcal Conjugate-13 09/28/2018   Pneumococcal Polysaccharide-23 10/21/2017   Tdap 02/23/2021   Pertinent  Health Maintenance Due  Topic Date Due   INFLUENZA VACCINE  05/21/2021   PNA vac Low  Risk Adult  Completed   DEXA SCAN  Discontinued   Fall Risk  07/19/2020  Falls in the past year? 1  Number falls in past yr: 0  Injury with Fall? 0   Functional Status Survey:    Vitals:   04/05/21 1615  BP: (!) 154/83  Pulse: (!) 57  Temp: (!) 97.3 F (36.3 C)  SpO2: 95%  Weight: 153 lb 3.2 oz (69.5 kg)  Height: 5\' 1"  (1.549 m)   Body mass index is 28.95 kg/m. Physical Exam Constitutional:  Well-developed and well-nourished.  HENT:  Head: Normocephalic.  Mouth/Throat: Oropharynx is clear and moist.  Eyes: Pupils are equal, round, and reactive to light.  Neck: Neck supple.  Cardiovascular: Normal rate and normal heart sounds.  No murmur heard. Pulmonary/Chest: Effort normal and breath sounds normal. No respiratory distress. No wheezes. She has no rales.  Abdominal: Soft. Bowel sounds are normal. No distension. There is no tenderness. There is no rebound.  Musculoskeletal: No edema.  Lymphadenopathy: none Neurological: No Focal Deficits  Skin: Skin is warm and dry.  Psychiatric: Normal mood and affect. Behavior is normal. Thought content normal.    Labs reviewed: Recent Labs    10/25/20 0000 11/27/20 0000 12/05/20 0000  NA 143 141  141 144  K 5.5* 4.2  4.2 4.4  CL 106 102  102 106  CO2 24* 24*  24* 26*  BUN 13 13  13 17   CREATININE 0.7 0.7  0.7 0.6  CALCIUM 9.3 9.2  9.2 9.1   Recent Labs    07/20/20 0000  AST 18  ALT 13  ALBUMIN 4.2   Recent Labs    07/20/20 0000 10/18/20 0000  WBC 6.4 7.7  HGB 14.8 11.6*  HCT 44 34*  PLT 225 275   Lab Results  Component Value Date   TSH 1.13 07/20/2020   No results found for: HGBA1C Lab Results  Component Value Date   CHOL 235 (A) 07/20/2020   HDL 100 (A) 07/20/2020   LDLCALC 134 07/20/2020   TRIG 102 07/20/2020    Significant Diagnostic Results in last 30 days:  No results found.  Assessment/Plan Recurrent UTI Keflex 250 mg tid for 7 days Discontinue her Macrodantin as Klebsiella is  resistant to it Already on Flomax Will start her on Keflex 150 mg for prophylaxis if her family agrees Also can do Urology consult again   Late onset Alzheimer's dementia without behavioral disturbance (HCC) Supportive care Not on any meds Essential hypertension BP has been Elevated recently But then there are some low readings Will continue to follow IF runs high can increase her Losartan   Family/ staff Communication:   Labs/tests ordered:

## 2021-04-06 LAB — BASIC METABOLIC PANEL
BUN: 14 (ref 4–21)
CO2: 21 (ref 13–22)
Chloride: 102 (ref 99–108)
Creatinine: 0.8 (ref 0.5–1.1)
Glucose: 119
Potassium: 4.2 (ref 3.4–5.3)
Sodium: 136 — AB (ref 137–147)

## 2021-04-06 LAB — CBC AND DIFFERENTIAL
HCT: 39 (ref 36–46)
Hemoglobin: 13.1 (ref 12.0–16.0)
Platelets: 202 (ref 150–399)
WBC: 6.1

## 2021-04-06 LAB — COMPREHENSIVE METABOLIC PANEL: Calcium: 9.6 (ref 8.7–10.7)

## 2021-04-06 LAB — CBC: RBC: 4.62 (ref 3.87–5.11)

## 2021-05-01 ENCOUNTER — Encounter: Payer: Self-pay | Admitting: Orthopedic Surgery

## 2021-05-01 ENCOUNTER — Encounter: Payer: Self-pay | Admitting: Internal Medicine

## 2021-05-01 ENCOUNTER — Non-Acute Institutional Stay (SKILLED_NURSING_FACILITY): Payer: Medicare Other | Admitting: Orthopedic Surgery

## 2021-05-01 DIAGNOSIS — Z7409 Other reduced mobility: Secondary | ICD-10-CM | POA: Diagnosis not present

## 2021-05-01 DIAGNOSIS — N390 Urinary tract infection, site not specified: Secondary | ICD-10-CM

## 2021-05-01 DIAGNOSIS — G301 Alzheimer's disease with late onset: Secondary | ICD-10-CM

## 2021-05-01 DIAGNOSIS — I10 Essential (primary) hypertension: Secondary | ICD-10-CM

## 2021-05-01 DIAGNOSIS — F0280 Dementia in other diseases classified elsewhere without behavioral disturbance: Secondary | ICD-10-CM

## 2021-05-01 DIAGNOSIS — K5901 Slow transit constipation: Secondary | ICD-10-CM

## 2021-05-01 NOTE — Progress Notes (Signed)
Location:   [facility] Room Number: 140 Place of Service:  SNF [phone]) Provider:  Hazle Nordmann, NP  Mahlon Gammon, MD  Patient Care Team: Mahlon Gammon, MD as PCP - General (Internal Medicine) Jamison Neighbor, MD (Urology)  Extended Emergency Contact Information Primary Emergency Contact: Alvino Blood Home Phone: [phone] Work Phone: [phone] Mobile Phone: [phone] Relation: Son Secondary Emergency Contact: Corinna Lines Work Phone: [phone] Mobile Phone: [phone] Relation: Spouse  Code Status:  DNR Goals of care: Advanced Directive information Advanced Directives 05/01/2021  Does Patient Have a Medical Advance Directive? Yes  Type of Advance Directive Living will;Out of facility DNR (pink MOST or yellow form)  Does patient want to make changes to medical advance directive? No - Patient declined  Copy of Healthcare Power of Attorney in Chart? -  Pre-existing out of facility DNR order (yellow form or pink MOST form) Yellow form placed in chart (order not valid for inpatient use)     Chief Complaint  Patient presents with  . Medical Management of Chronic Issues  . Health Maintenance    #4 covid vaccine and shingrix due    HPI:  Pt is a 86 y.o. female seen today for medical management of chronic diseases.     Past Medical History:  Diagnosis Date  . Alzheimer disease (HCC)   . Balance problem 07/19/2020  . Constipation 07/19/2020  . Dementia without behavioral disturbance (HCC) 07/19/2020   MMSE 21/30 07/21/20  . Fall   . Osteoarthritis   . Pyelonephritis   . Spinal stenosis 07/19/2020  . Weakness of left lower extremity 07/19/2020   History reviewed. No pertinent surgical history.  Allergies  Allergen Reactions  . Sulfa Antibiotics Rash    Rash to trunk, legs, neck, scalp, and arms    Allergies as of 05/01/2021       Reactions   Sulfa Antibiotics Rash   Rash to trunk, legs, neck, scalp, and arms         Medication List        Accurate as of May 01, 2021  9:59 AM. If you have any questions, ask your nurse or doctor.          STOP taking these medications    MACROBID PO Stopped by: Mahlon Gammon, MD       TAKE these medications    acetaminophen 500 MG tablet Commonly known as: TYLENOL Take 1,000 mg by mouth 3 (three) times daily as needed.   D-Mannose Powd Take 4 capsules by mouth daily at 12 noon. 99%   docusate sodium 100 MG capsule Commonly known as: COLACE Take 100 mg by mouth 2 (two) times daily as needed.   estradiol 2 MG vaginal ring Commonly known as: ESTRING Place 2 mg vaginally every 3 (three) months. follow package directions   ibuprofen 200 MG tablet Commonly known as: ADVIL Take 400 mg by mouth 3 (three) times daily as needed.   losartan 25 MG tablet Commonly known as: COZAAR Take 25 mg by mouth daily.   senna 8.6 MG Tabs tablet Commonly known as: SENOKOT Take 2 tablets by mouth in the morning and at bedtime.   tamsulosin 0.4 MG Caps capsule Commonly known as: FLOMAX Take 0.4 mg by mouth at bedtime.   THERACRAN PO Take 250 mg by mouth in the morning and at bedtime.   trimethoprim 100 MG tablet Commonly known as: TRIMPEX Take 100 mg by mouth daily.  Review of Systems  Immunization History  Administered Date(s) Administered  . Influenza-Unspecified 08/05/2018, 09/29/2018, 07/02/2019, 08/21/2019, 08/11/2020  . Moderna SARS-COV2 Booster Vaccination 08/31/2020  . Moderna Sars-Covid-2 Vaccination 11/04/2019, 12/02/2019  . Pneumococcal Conjugate-13 09/28/2018  . Pneumococcal Polysaccharide-23 10/21/2017  . Tdap 02/23/2021   Pertinent  Health Maintenance Due  Topic Date Due  . INFLUENZA VACCINE  05/21/2021  . PNA vac Low Risk Adult  Completed  . DEXA SCAN  Discontinued   Fall Risk  07/19/2020  Falls in the past year? 1  Number falls in past yr: 0  Injury with Fall? 0   Functional Status Survey:    Vitals:    05/01/21 0953  BP: 104/65  Pulse: (!) 58  Temp: (!) 97.3 F (36.3 C)  SpO2: 96%  Weight: 148 lb 9.6 oz (67.4 kg)  Height: 5\' 1"  (1.549 m)   Body mass index is 28.08 kg/m. Physical Exam  Labs reviewed: Recent Labs    11/27/20 0000 12/05/20 0000 04/06/21 0000  NA 141  141 144 136*  K 4.2  4.2 4.4 4.2  CL 102  102 106 102  CO2 24*  24* 26* 21  BUN 13  13 17 14   CREATININE 0.7  0.7 0.6 0.8  CALCIUM 9.2  9.2 9.1 9.6   Recent Labs    07/20/20 0000  AST 18  ALT 13  ALBUMIN 4.2   Recent Labs    07/20/20 0000 10/18/20 0000 04/06/21 0000  WBC 6.4 7.7 6.1  HGB 14.8 11.6* 13.1  HCT 44 34* 39  PLT 225 275 202   Lab Results  Component Value Date   TSH 1.13 07/20/2020   No results found for: HGBA1C Lab Results  Component Value Date   CHOL 235 (A) 07/20/2020   HDL 100 (A) 07/20/2020   LDLCALC 134 07/20/2020   TRIG 102 07/20/2020    Significant Diagnostic Results in last 30 days:  No results found.  Assessment/Plan There are no diagnoses linked to this encounter.   Family/ staff Communication:   Labs/tests ordered:      This encounter was created in error - please disregard.

## 2021-05-01 NOTE — Progress Notes (Signed)
Location:   [facility] Room Number: 140 Place of Service:  SNF [phone]) Provider:  Hazle Nordmann, NP  Mahlon Gammon, MD  Patient Care Team: Mahlon Gammon, MD as PCP - General (Internal Medicine) Jamison Neighbor, MD (Urology)  Extended Emergency Contact Information Primary Emergency Contact: Alvino Blood Home Phone: [phone] Work Phone: [phone] Mobile Phone: [phone] Relation: Son Secondary Emergency Contact: Corinna Lines Work Phone: [phone] Mobile Phone: [phone] Relation: Spouse  Code Status:  DNR Goals of care: Advanced Directive information Advanced Directives 05/01/2021  Does Patient Have a Medical Advance Directive? Yes  Type of Advance Directive Living will;Out of facility DNR (pink MOST or yellow form)  Does patient want to make changes to medical advance directive? No - Patient declined  Copy of Healthcare Power of Attorney in Chart? -  Pre-existing out of facility DNR order (yellow form or pink MOST form) Yellow form placed in chart (order not valid for inpatient use)     Chief Complaint  Patient presents with   Medical Management of Chronic Issues   Health Maintenance    #4 covid and shingrix    HPI:  Pt is a 86 y.o. female seen today for medical management of chronic diseases.    She currently resides on the skilled nursing unit at [facility] due to Alzheimer's dementia. Past medical history includes: hypertension, recurrent UTI, constipation, weakness and spinal stenosis.   Alzheimer's dementia- MMSE 21/30 07/21/2020, word finding during our conversation, very pleasant overall, no reports of behavioral outbursts.  Gait- shuffling gait, uses walker prn, no recent falls or injuries HTN- BUN/creat 14/0.8 04/06/2021, losartan 25 mg daily Recurrent UTI- macrobid discontinued due to klebsiella, trimethoprim 100 mg daily for prophylaxis, cranberry supplement, flomax 0.4 mg daily Constipation- LBM today, senna  bid  Recent blood pressures:  07/09- 104/65  07/07- 155/83  07/06- 121/77  07/04- 107/62  Recent weights:  07/01- 148.6 lbs  06/01- 153.2 lbs  05/01- 152.2 lbs   Nurse does not report any concerns, vitals stable.      Past Medical History:  Diagnosis Date   Alzheimer disease (HCC)    Balance problem 07/19/2020   Constipation 07/19/2020   Dementia without behavioral disturbance (HCC) 07/19/2020   MMSE 21/30 07/21/20   Fall    Osteoarthritis    Pyelonephritis    Spinal stenosis 07/19/2020   Weakness of left lower extremity 07/19/2020   No past surgical history on file.  Allergies  Allergen Reactions   Sulfa Antibiotics Rash    Rash to trunk, legs, neck, scalp, and arms    Allergies as of 05/01/2021       Reactions   Sulfa Antibiotics Rash   Rash to trunk, legs, neck, scalp, and arms        Medication List        Accurate as of May 01, 2021 10:07 AM. If you have any questions, ask your nurse or doctor.          STOP taking these medications    MACROBID PO Stopped by: Mahlon Gammon, MD       TAKE these medications    acetaminophen 500 MG tablet Commonly known as: TYLENOL Take 1,000 mg by mouth 3 (three) times daily as needed.   D-Mannose Powd Take 4 capsules by mouth daily at 12 noon. 99%   docusate sodium 100 MG capsule Commonly known as: COLACE Take 100 mg by mouth 2 (two) times daily as needed.   estradiol 2  MG vaginal ring Commonly known as: ESTRING Place 2 mg vaginally every 3 (three) months. follow package directions   ibuprofen 200 MG tablet Commonly known as: ADVIL Take 400 mg by mouth 3 (three) times daily as needed.   losartan 25 MG tablet Commonly known as: COZAAR Take 25 mg by mouth daily.   senna 8.6 MG Tabs tablet Commonly known as: SENOKOT Take 2 tablets by mouth in the morning and at bedtime.   tamsulosin 0.4 MG Caps capsule Commonly known as: FLOMAX Take 0.4 mg by mouth at bedtime.   THERACRAN PO Take 250 mg  by mouth in the morning and at bedtime.   trimethoprim 100 MG tablet Commonly known as: TRIMPEX Take 100 mg by mouth daily.        Review of Systems  Unable to perform ROS: Dementia   Immunization History  Administered Date(s) Administered   Influenza-Unspecified 08/05/2018, 09/29/2018, 07/02/2019, 08/21/2019, 08/11/2020   Moderna SARS-COV2 Booster Vaccination 08/31/2020   Moderna Sars-Covid-2 Vaccination 11/04/2019, 12/02/2019   Pneumococcal Conjugate-13 09/28/2018   Pneumococcal Polysaccharide-23 10/21/2017   Tdap 02/23/2021   Pertinent  Health Maintenance Due  Topic Date Due   INFLUENZA VACCINE  05/21/2021   PNA vac Low Risk Adult  Completed   DEXA SCAN  Discontinued   Fall Risk  07/19/2020  Falls in the past year? 1  Number falls in past yr: 0  Injury with Fall? 0   Functional Status Survey:    Vitals:   05/01/21 1006  BP: 104/65  Pulse: (!) 58  Resp: 16  Temp: (!) 97.3 F (36.3 C)  SpO2: 96%  Weight: 148 lb 9.6 oz (67.4 kg)  Height: 5\' 1"  (1.549 m)   Body mass index is 28.08 kg/m. Physical Exam Vitals reviewed.  Constitutional:      General: She is not in acute distress. HENT:     Head: Normocephalic.     Right Ear: There is no impacted cerumen.     Left Ear: There is no impacted cerumen.     Nose: Nose normal.     Mouth/Throat:     Mouth: Mucous membranes are moist.  Eyes:     General:        Right eye: No discharge.        Left eye: No discharge.  Neck:     Vascular: No carotid bruit.  Cardiovascular:     Rate and Rhythm: Normal rate and regular rhythm.     Pulses: Normal pulses.     Heart sounds: Normal heart sounds. No murmur heard. Pulmonary:     Effort: Pulmonary effort is normal. No respiratory distress.     Breath sounds: Normal breath sounds. No wheezing.  Abdominal:     General: Bowel sounds are normal. There is no distension.     Palpations: Abdomen is soft.     Tenderness: There is no abdominal tenderness.  Musculoskeletal:      Cervical back: Normal range of motion.     Right lower leg: No edema.     Left lower leg: No edema.  Lymphadenopathy:     Cervical: No cervical adenopathy.  Skin:    General: Skin is warm and dry.     Capillary Refill: Capillary refill takes less than 2 seconds.  Neurological:     General: No focal deficit present.     Mental Status: She is alert. Mental status is at baseline.     Motor: Weakness present.     Gait: Gait abnormal.  Comments: walker  Psychiatric:        Mood and Affect: Mood normal.        Behavior: Behavior normal.        Cognition and Memory: Memory is impaired.    Labs reviewed: Recent Labs    11/27/20 0000 12/05/20 0000 04/06/21 0000  NA 141  141 144 136*  K 4.2  4.2 4.4 4.2  CL 102  102 106 102  CO2 24*  24* 26* 21  BUN 13  13 17 14   CREATININE 0.7  0.7 0.6 0.8  CALCIUM 9.2  9.2 9.1 9.6   Recent Labs    07/20/20 0000  AST 18  ALT 13  ALBUMIN 4.2   Recent Labs    07/20/20 0000 10/18/20 0000 04/06/21 0000  WBC 6.4 7.7 6.1  HGB 14.8 11.6* 13.1  HCT 44 34* 39  PLT 225 275 202   Lab Results  Component Value Date   TSH 1.13 07/20/2020   No results found for: HGBA1C Lab Results  Component Value Date   CHOL 235 (A) 07/20/2020   HDL 100 (A) 07/20/2020   LDLCALC 134 07/20/2020   TRIG 102 07/20/2020    Significant Diagnostic Results in last 30 days:  No results found.  Assessment/Plan 1. Late onset Alzheimer's dementia without behavioral disturbance (HCC) - MMSE 21/30 07/2021 - very pleasant, no recent behaviors noted - cont skilled nursing care  2. Impaired mobility - wide shuffling gait observed, does not remember to use walker - cont falls safety precautions  3. Essential hypertension - controlled - cont losartan 25 mg daily  4. Recurrent UTI - Macrobid discontinued due to klebsiella resistance - cont trimethoprim 100 daily for prophylaxis - cont cranberry supplement daily - cont flomax 0.4 mg daily -  encourage hydration with water a few times daily  5. Slow transit constipation - daily bowel movements - cont senna bid    Family/ staff Communication: plan discussed wit nurse   Labs/tests ordered:  none

## 2021-05-17 ENCOUNTER — Non-Acute Institutional Stay (SKILLED_NURSING_FACILITY): Payer: Medicare Other | Admitting: Adult Health

## 2021-05-17 ENCOUNTER — Encounter: Payer: Self-pay | Admitting: Adult Health

## 2021-05-17 DIAGNOSIS — R42 Dizziness and giddiness: Secondary | ICD-10-CM | POA: Diagnosis not present

## 2021-05-17 DIAGNOSIS — R300 Dysuria: Secondary | ICD-10-CM

## 2021-05-17 LAB — CBC: RBC: 5 (ref 3.87–5.11)

## 2021-05-17 LAB — BASIC METABOLIC PANEL
BUN: 16 (ref 4–21)
CO2: 20 (ref 13–22)
Chloride: 101 (ref 99–108)
Creatinine: 1 (ref 0.5–1.1)
Glucose: 91
Potassium: 5.8 — AB (ref 3.4–5.3)
Sodium: 135 — AB (ref 137–147)

## 2021-05-17 LAB — COMPREHENSIVE METABOLIC PANEL: Calcium: 9.7 (ref 8.7–10.7)

## 2021-05-17 LAB — CBC AND DIFFERENTIAL
HCT: 43 (ref 36–46)
Hemoglobin: 14.2 (ref 12.0–16.0)
Platelets: 186 (ref 150–399)
WBC: 7.1

## 2021-05-17 NOTE — Progress Notes (Signed)
Location:   [facility] Room Number: 140 Place of Service:  SNF [phone]) Provider:  Fletcher Anon, NP  Mahlon Gammon, MD  Patient Care Team: Mahlon Gammon, MD as PCP - General (Internal Medicine) Jamison Neighbor, MD (Urology)  Extended Emergency Contact Information Primary Emergency Contact: Alvino Blood Home Phone: [phone] Work Phone: [phone] Mobile Phone: [phone] Relation: Son Secondary Emergency Contact: Corinna Lines Work Phone: [phone] Mobile Phone: [phone] Relation: Spouse  Code Status:  DNR Goals of care: Advanced Directive information Advanced Directives 05/17/2021  Does Patient Have a Medical Advance Directive? Yes  Type of Advance Directive Living will;Out of facility DNR (pink MOST or yellow form)  Does patient want to make changes to medical advance directive? No - Patient declined  Copy of Healthcare Power of Attorney in Chart? -  Pre-existing out of facility DNR order (yellow form or pink MOST form) Yellow form placed in chart (order not valid for inpatient use)     Chief Complaint  Patient presents with   Acute Visit    Low bp     HPI:  Pt is a 86 y.o. female seen today for an acute visit for low bp and feeing dizzy. Cheryl Monroe has Alzheimer's dementia and resides in skilled care. She went out for a stay with her family and returned with feelings of weakness and dizziness. Her systolic BP was 98/60 this am with HR 78.  She has not had a fever or cough. Rapid covid was done and is negative. She reports some burning with urination  when prompted to respond but no frequency or bladder pain. She takes flomax each evening to prevent urinary retention and sometimes in the morning her bp is soft but higher as the day goes on.  She is on losartan for HTN.  LBM 7/26.  She does not have one sided weakness or facial droop. She is eating and drinking well and continues to walk with her walker.    Past Medical  History:  Diagnosis Date   Alzheimer disease (HCC)    Balance problem 07/19/2020   Constipation 07/19/2020   Dementia without behavioral disturbance (HCC) 07/19/2020   MMSE 21/30 07/21/20   Fall    Osteoarthritis    Pyelonephritis    Spinal stenosis 07/19/2020   Weakness of left lower extremity 07/19/2020   History reviewed. No pertinent surgical history.  Allergies  Allergen Reactions   Sulfa Antibiotics Rash    Rash to trunk, legs, neck, scalp, and arms    Allergies as of 05/17/2021       Reactions   Sulfa Antibiotics Rash   Rash to trunk, legs, neck, scalp, and arms        Medication List        Accurate as of May 17, 2021  3:27 PM. If you have any questions, ask your nurse or doctor.          acetaminophen 500 MG tablet Commonly known as: TYLENOL Take 1,000 mg by mouth 3 (three) times daily as needed.   D-Mannose Powd Take 4 capsules by mouth daily at 12 noon. 99%   docusate sodium 100 MG capsule Commonly known as: COLACE Take 100 mg by mouth 2 (two) times daily as needed.   estradiol 2 MG vaginal ring Commonly known as: ESTRING Place 2 mg vaginally every 3 (three) months. follow package directions   ibuprofen 200 MG tablet Commonly known as: ADVIL Take 400 mg by mouth 3 (three) times daily  as needed.   losartan 25 MG tablet Commonly known as: COZAAR Take 25 mg by mouth daily.   senna 8.6 MG Tabs tablet Commonly known as: SENOKOT Take 2 tablets by mouth in the morning and at bedtime.   tamsulosin 0.4 MG Caps capsule Commonly known as: FLOMAX Take 0.4 mg by mouth at bedtime.   THERACRAN PO Take 250 mg by mouth in the morning and at bedtime.   trimethoprim 100 MG tablet Commonly known as: TRIMPEX Take 100 mg by mouth daily.        Review of Systems  Constitutional:  Positive for fatigue. Negative for activity change, appetite change, chills, diaphoresis, fever and unexpected weight change.  HENT:  Negative for congestion.   Respiratory:   Negative for cough, shortness of breath and wheezing.   Cardiovascular:  Negative for chest pain, palpitations and leg swelling.  Gastrointestinal:  Negative for abdominal distention, abdominal pain, constipation and diarrhea.  Genitourinary:  Positive for dysuria. Negative for decreased urine volume, difficulty urinating and urgency.  Musculoskeletal:  Positive for gait problem. Negative for arthralgias, back pain, joint swelling and myalgias.  Neurological:  Positive for weakness. Negative for dizziness, tremors, seizures, syncope, facial asymmetry, speech difficulty, light-headedness, numbness and headaches.  Psychiatric/Behavioral:  Positive for confusion. Negative for agitation and behavioral problems.    Immunization History  Administered Date(s) Administered   Influenza-Unspecified 08/05/2018, 09/29/2018, 07/02/2019, 08/21/2019, 08/11/2020   Moderna SARS-COV2 Booster Vaccination 08/31/2020   Moderna Sars-Covid-2 Vaccination 11/04/2019, 12/02/2019   Pneumococcal Conjugate-13 09/28/2018   Pneumococcal Polysaccharide-23 10/21/2017   Tdap 02/23/2021   Pertinent  Health Maintenance Due  Topic Date Due   INFLUENZA VACCINE  05/21/2021   PNA vac Low Risk Adult  Completed   DEXA SCAN  Discontinued   Fall Risk  07/19/2020  Falls in the past year? 1  Number falls in past yr: 0  Injury with Fall? 0   Functional Status Survey:    Vitals:   05/17/21 1522  BP: 116/69  Pulse: 78  Resp: 18  Temp: (!) 97.2 F (36.2 C)  SpO2: 96%  Weight: 148 lb 9.6 oz (67.4 kg)  Height: 5\' 1"  (1.549 m)   Body mass index is 28.08 kg/m. Physical Exam Vitals and nursing note reviewed.  Constitutional:      General: She is not in acute distress.    Appearance: She is not diaphoretic.  HENT:     Head: Normocephalic and atraumatic.     Mouth/Throat:     Mouth: Mucous membranes are moist.     Pharynx: Oropharynx is clear. No oropharyngeal exudate.  Neck:     Vascular: No JVD.  Cardiovascular:      Rate and Rhythm: Normal rate and regular rhythm.     Heart sounds: No murmur heard. Pulmonary:     Effort: Pulmonary effort is normal. No respiratory distress.     Breath sounds: Normal breath sounds. No wheezing.  Abdominal:     General: Abdomen is flat. Bowel sounds are normal. There is no distension.     Palpations: Abdomen is soft.     Tenderness: There is no abdominal tenderness. There is no right CVA tenderness or left CVA tenderness.  Musculoskeletal:     Cervical back: No rigidity or tenderness.     Right lower leg: No edema.     Left lower leg: No edema.  Lymphadenopathy:     Cervical: No cervical adenopathy.  Skin:    General: Skin is warm and dry.  Neurological:  General: No focal deficit present.     Mental Status: She is alert. Mental status is at baseline.     Cranial Nerves: No cranial nerve deficit.  Psychiatric:        Mood and Affect: Mood normal.    Labs reviewed: Recent Labs    11/27/20 0000 12/05/20 0000 04/06/21 0000  NA 141  141 144 136*  K 4.2  4.2 4.4 4.2  CL 102  102 106 102  CO2 24*  24* 26* 21  BUN 13  13 17 14   CREATININE 0.7  0.7 0.6 0.8  CALCIUM 9.2  9.2 9.1 9.6   Recent Labs    07/20/20 0000  AST 18  ALT 13  ALBUMIN 4.2   Recent Labs    07/20/20 0000 10/18/20 0000 04/06/21 0000  WBC 6.4 7.7 6.1  HGB 14.8 11.6* 13.1  HCT 44 34* 39  PLT 225 275 202   Lab Results  Component Value Date   TSH 1.13 07/20/2020   No results found for: HGBA1C Lab Results  Component Value Date   CHOL 235 (A) 07/20/2020   HDL 100 (A) 07/20/2020   LDLCALC 134 07/20/2020   TRIG 102 07/20/2020    Significant Diagnostic Results in last 30 days:  No results found.  Assessment/Plan  1. Dizziness Mild without focal deficit This is not a new complaint but somewhat worse at this time. Could be due to losartan and/or flomax.  Will d/c losartan  Check BMP and CBC Encourage oral fluids  2. Dysuria Check UA C and S   Labs/tests  ordered:   CBC BMP UA C and S

## 2021-05-18 ENCOUNTER — Encounter: Payer: Self-pay | Admitting: Adult Health

## 2021-05-28 ENCOUNTER — Non-Acute Institutional Stay (SKILLED_NURSING_FACILITY): Payer: Medicare Other | Admitting: Adult Health

## 2021-05-28 DIAGNOSIS — K5901 Slow transit constipation: Secondary | ICD-10-CM

## 2021-05-28 DIAGNOSIS — F0280 Dementia in other diseases classified elsewhere without behavioral disturbance: Secondary | ICD-10-CM

## 2021-05-28 DIAGNOSIS — N312 Flaccid neuropathic bladder, not elsewhere classified: Secondary | ICD-10-CM

## 2021-05-28 DIAGNOSIS — G301 Alzheimer's disease with late onset: Secondary | ICD-10-CM

## 2021-05-28 DIAGNOSIS — N390 Urinary tract infection, site not specified: Secondary | ICD-10-CM

## 2021-05-28 DIAGNOSIS — R42 Dizziness and giddiness: Secondary | ICD-10-CM

## 2021-05-28 NOTE — Progress Notes (Signed)
Location:  Medical illustrator of Service:  SNF (31) Provider:   Peggye Ley, ANP [facility] [phone]   Mahlon Gammon, MD  Patient Care Team: Mahlon Gammon, MD as PCP - General (Internal Medicine) Jamison Neighbor, MD (Urology)  Extended Emergency Contact Information Primary Emergency Contact: Alvino Blood Home Phone: [phone] Work Phone: [phone] Mobile Phone: [phone] Relation: Son Secondary Emergency Contact: Corinna Lines Work Phone: [phone] Mobile Phone: [phone] Relation: Spouse  Code Status:  DNR Goals of care: Advanced Directive information Advanced Directives 05/17/2021  Does Patient Have a Medical Advance Directive? Yes  Type of Advance Directive Living will;Out of facility DNR (pink MOST or yellow form)  Does patient want to make changes to medical advance directive? No - Patient declined  Copy of Healthcare Power of Attorney in Chart? -  Pre-existing out of facility DNR order (yellow form or pink MOST form) Yellow form placed in chart (order not valid for inpatient use)     Chief Complaint  Patient presents with   Medical Management of Chronic Issues    HPI:  Pt is a 86 y.o. female seen today for medical management of chronic diseases.   She was seen by urgent care on 7/29 for weakness and dizziness. UA was obtained and 1 liter of fluid given. Initial UA was unremarkable with no WBC, blood, nitrates, leukocyte esterase, etc. F/U culture grew 100,000 colonies of Pseudomonas. She was initially started on Keflex on 7/29 but then later changed to Cipro. She did not and still does not have any urinary symptoms.  CBC was unremarkable with a WBC of 7.1,  CBC BUN 16 Cr 1.0.  BP had been soft, but was 160 at urgent care.  She is now back to her baseline, ambulatory with confusion and participating in activities. She just finished an exercise class and says she feels good. She chronically complains of  dizziness. She has recurrent UTI and hx of sepsis along with a hx o atonic/neurogenic bladder with hx of retention. She is also incontinent.   BP 130/66 BP 115/74  Intake and out put good  Past Medical History:  Diagnosis Date   Alzheimer disease (HCC)    Balance problem 07/19/2020   Constipation 07/19/2020   Dementia without behavioral disturbance (HCC) 07/19/2020   MMSE 21/30 07/21/20   Fall    Osteoarthritis    Pyelonephritis    Spinal stenosis 07/19/2020   Weakness of left lower extremity 07/19/2020   History reviewed. No pertinent surgical history.  Allergies  Allergen Reactions   Sulfa Antibiotics Rash    Rash to trunk, legs, neck, scalp, and arms    Outpatient Encounter Medications as of 05/28/2021  Medication Sig   acetaminophen (TYLENOL) 500 MG tablet Take 1,000 mg by mouth 3 (three) times daily as needed.   Cranberry (THERACRAN PO) Take 250 mg by mouth in the morning and at bedtime.   D-Mannose POWD Take 4 capsules by mouth daily at 12 noon. 99%   docusate sodium (COLACE) 100 MG capsule Take 100 mg by mouth 2 (two) times daily as needed.    estradiol (ESTRING) 2 MG vaginal ring Place 2 mg vaginally every 3 (three) months. follow package directions   ibuprofen (ADVIL) 200 MG tablet Take 400 mg by mouth 3 (three) times daily as needed.   senna (SENOKOT) 8.6 MG TABS tablet Take 2 tablets by mouth in the morning and at bedtime.   tamsulosin (FLOMAX) 0.4 MG CAPS capsule Take  0.4 mg by mouth at bedtime.   [DISCONTINUED] losartan (COZAAR) 25 MG tablet Take 25 mg by mouth daily.   [DISCONTINUED] trimethoprim (TRIMPEX) 100 MG tablet Take 100 mg by mouth daily.   No facility-administered encounter medications on file as of 05/28/2021.    Review of Systems  Constitutional:  Negative for activity change, appetite change, chills, diaphoresis, fatigue, fever and unexpected weight change.  HENT:  Negative for congestion.   Respiratory:  Negative for cough, shortness of breath and  wheezing.   Cardiovascular:  Negative for chest pain, palpitations and leg swelling.  Gastrointestinal:  Negative for abdominal distention, abdominal pain, constipation, diarrhea, nausea and vomiting.  Genitourinary:  Negative for difficulty urinating, dysuria and urgency.  Musculoskeletal:  Negative for arthralgias, back pain, gait problem, joint swelling, myalgias and neck pain.  Skin:  Negative for rash.  Neurological:  Negative for dizziness (intermittent chronic complaint), tremors, seizures, syncope, facial asymmetry, speech difficulty, weakness, light-headedness, numbness and headaches.  Psychiatric/Behavioral:  Positive for confusion. Negative for agitation and behavioral problems.    Immunization History  Administered Date(s) Administered   Influenza-Unspecified 08/05/2018, 09/29/2018, 07/02/2019, 08/21/2019, 08/11/2020   Moderna SARS-COV2 Booster Vaccination 08/31/2020   Moderna Sars-Covid-2 Vaccination 11/04/2019, 12/02/2019   Pneumococcal Conjugate-13 09/28/2018   Pneumococcal Polysaccharide-23 10/21/2017   Tdap 02/23/2021   Pertinent  Health Maintenance Due  Topic Date Due   INFLUENZA VACCINE  05/21/2021   PNA vac Low Risk Adult  Completed   DEXA SCAN  Discontinued   Fall Risk  07/19/2020  Falls in the past year? 1  Number falls in past yr: 0  Injury with Fall? 0   Functional Status Survey:    Vitals:   05/30/21 0818  Weight: 142 lb 3.2 oz (64.5 kg)   Body mass index is 26.87 kg/m. Physical Exam Vitals and nursing note reviewed.  Constitutional:      General: She is not in acute distress.    Appearance: She is not diaphoretic.  HENT:     Head: Normocephalic and atraumatic.  Neck:     Vascular: No JVD.  Cardiovascular:     Rate and Rhythm: Normal rate and regular rhythm.     Heart sounds: No murmur heard. Pulmonary:     Effort: Pulmonary effort is normal. No respiratory distress.     Breath sounds: Normal breath sounds. No wheezing.  Abdominal:      General: Bowel sounds are normal. There is no distension.     Palpations: Abdomen is soft.     Tenderness: There is no abdominal tenderness. There is no right CVA tenderness or left CVA tenderness.  Skin:    General: Skin is warm and dry.  Neurological:     Mental Status: She is alert and oriented to person, place, and time.  Psychiatric:        Mood and Affect: Mood normal.    Labs reviewed: Recent Labs    12/05/20 0000 04/06/21 0000 05/17/21 0000  NA 144 136* 135*  K 4.4 4.2 5.8*  CL 106 102 101  CO2 26* 21 20  BUN 17 14 16   CREATININE 0.6 0.8 1.0  CALCIUM 9.1 9.6 9.7   Recent Labs    07/20/20 0000  AST 18  ALT 13  ALBUMIN 4.2   Recent Labs    10/18/20 0000 04/06/21 0000 05/17/21 0000  WBC 7.7 6.1 7.1  HGB 11.6* 13.1 14.2  HCT 34* 39 43  PLT 275 202 186   Lab Results  Component Value  Date   TSH 1.13 07/20/2020   No results found for: HGBA1C Lab Results  Component Value Date   CHOL 235 (A) 07/20/2020   HDL 100 (A) 07/20/2020   LDLCALC 134 07/20/2020   TRIG 102 07/20/2020    Significant Diagnostic Results in last 30 days:  No results found.  Assessment/Plan  1. Recurrent UTI We recommend an ID evaluation due to the pseudomonas growing in her urine and the concern for antibiotic stewardship.   2. Atonic bladder Followed by urology Remains incontinent but no issues with retention at this time  3. Slow transit constipation No current issues Continue scheduled senokot   4. Dizziness Resolved   5. Late onset Alzheimer's dementia without behavioral disturbance (HCC) Moderate stage Continue supportive care in the skilled setting.    Labs/tests ordered:   CMP 8/25

## 2021-05-30 ENCOUNTER — Encounter: Payer: Self-pay | Admitting: Adult Health

## 2021-06-28 LAB — BASIC METABOLIC PANEL
BUN: 15 (ref 4–21)
CO2: 25 — AB (ref 13–22)
Chloride: 105 (ref 99–108)
Creatinine: 0.6 (ref 0.5–1.1)
Glucose: 98
Potassium: 4.4 (ref 3.4–5.3)
Sodium: 140 (ref 137–147)

## 2021-06-28 LAB — COMPREHENSIVE METABOLIC PANEL
Albumin: 3.9 (ref 3.5–5.0)
Calcium: 9.1 (ref 8.7–10.7)
Globulin: 2.5

## 2021-06-28 LAB — HEPATIC FUNCTION PANEL
ALT: 12 (ref 7–35)
AST: 17 (ref 13–35)
Alkaline Phosphatase: 98 (ref 25–125)
Bilirubin, Total: 0.4

## 2021-07-19 ENCOUNTER — Encounter: Payer: Self-pay | Admitting: Adult Health

## 2021-07-19 ENCOUNTER — Non-Acute Institutional Stay (SKILLED_NURSING_FACILITY): Payer: Medicare Other | Admitting: Adult Health

## 2021-07-19 DIAGNOSIS — N390 Urinary tract infection, site not specified: Secondary | ICD-10-CM | POA: Diagnosis not present

## 2021-07-19 DIAGNOSIS — K5901 Slow transit constipation: Secondary | ICD-10-CM

## 2021-07-19 DIAGNOSIS — G301 Alzheimer's disease with late onset: Secondary | ICD-10-CM

## 2021-07-19 DIAGNOSIS — I10 Essential (primary) hypertension: Secondary | ICD-10-CM

## 2021-07-19 DIAGNOSIS — Z7409 Other reduced mobility: Secondary | ICD-10-CM

## 2021-07-19 DIAGNOSIS — F0280 Dementia in other diseases classified elsewhere without behavioral disturbance: Secondary | ICD-10-CM

## 2021-07-19 DIAGNOSIS — N319 Neuromuscular dysfunction of bladder, unspecified: Secondary | ICD-10-CM | POA: Diagnosis not present

## 2021-07-19 DIAGNOSIS — M4800 Spinal stenosis, site unspecified: Secondary | ICD-10-CM

## 2021-07-19 NOTE — Progress Notes (Signed)
Location:  Medical illustrator of Service:  SNF (31) Provider:   Peggye Ley, ANP [facility] [phone]   Mahlon Gammon, MD  Patient Care Team: Mahlon Gammon, MD as PCP - General (Internal Medicine) Jamison Neighbor, MD (Urology)  Extended Emergency Contact Information Primary Emergency Contact: Alvino Blood Home Phone: [phone] Work Phone: [phone] Mobile Phone: [phone] Relation: Son Secondary Emergency Contact: Corinna Lines Work Phone: [phone] Mobile Phone: [phone] Relation: Spouse  Code Status:  DNR Goals of care: Advanced Directive information Advanced Directives 05/17/2021  Does Patient Have a Medical Advance Directive? Yes  Type of Advance Directive Living will;Out of facility DNR (pink MOST or yellow form)  Does patient want to make changes to medical advance directive? No - Patient declined  Copy of Healthcare Power of Attorney in Chart? -  Pre-existing out of facility DNR order (yellow form or pink MOST form) Yellow form placed in chart (order not valid for inpatient use)     Chief Complaint  Patient presents with  . Medical Management of Chronic Issues    HPI:  Cheryl Monroe is a 86 y.o. female seen today for medical management of chronic diseases.   She resides in skilled care due to AD and incontinence.  She has a hx of neurogenic bladder with incomplete emptying, recurrent UTI and hx of urosepsis, spinal stenosis, constipation, OA, gait abnormality, orthostatic dizziness. SBP 120-140 with occas low numbers in the 108 range.  Wt Readings from Last 3 Encounters:  07/19/21 150 lb 6.4 oz (68.2 kg)  05/30/21 142 lb 3.2 oz (64.5 kg)  05/17/21 148 lb 9.6 oz (67.4 kg)   Weights are variable. No increased edema or sob. Intakes reviewed are adequate.   She denies any pain or discomfort. Ambulating with a walker. No urinary symptoms  Seen by ID and recommended to stay on macrodantin for UTI prevention as she  has not had urosepsis since she has been on prophylaxis.   Past Medical History:  Diagnosis Date  . Alzheimer disease (HCC)   . Balance problem 07/19/2020  . Constipation 07/19/2020  . Dementia without behavioral disturbance (HCC) 07/19/2020   MMSE 21/30 07/21/20  . Fall   . Osteoarthritis   . Pyelonephritis   . Spinal stenosis 07/19/2020  . Weakness of left lower extremity 07/19/2020   History reviewed. No pertinent surgical history.  Allergies  Allergen Reactions  . Sulfa Antibiotics Rash    Rash to trunk, legs, neck, scalp, and arms    Outpatient Encounter Medications as of 07/19/2021  Medication Sig  . acetaminophen (TYLENOL) 500 MG tablet Take 1,000 mg by mouth 3 (three) times daily as needed.  . Cranberry (THERACRAN PO) Take 250 mg by mouth in the morning and at bedtime.  . D-Mannose POWD Take 4 capsules by mouth daily at 12 noon. 99%  . docusate sodium (COLACE) 100 MG capsule Take 100 mg by mouth 2 (two) times daily as needed.   Marland Kitchen estradiol (ESTRING) 2 MG vaginal ring Place 2 mg vaginally every 3 (three) months. follow package directions  . ibuprofen (ADVIL) 200 MG tablet Take 400 mg by mouth 3 (three) times daily as needed.  . nitrofurantoin (MACRODANTIN) 50 MG capsule Take 50 mg by mouth 2 (two) times daily.  Marland Kitchen senna (SENOKOT) 8.6 MG TABS tablet Take 2 tablets by mouth in the morning and at bedtime.  . tamsulosin (FLOMAX) 0.4 MG CAPS capsule Take 0.4 mg by mouth at bedtime.   No facility-administered  encounter medications on file as of 07/19/2021.    Review of Systems  Constitutional:  Negative for activity change, appetite change, chills, diaphoresis, fatigue, fever and unexpected weight change.  HENT:  Negative for congestion.   Respiratory:  Negative for cough, shortness of breath and wheezing.   Cardiovascular:  Negative for chest pain, palpitations and leg swelling.  Gastrointestinal:  Negative for abdominal distention, abdominal pain, constipation and diarrhea.   Genitourinary:  Negative for difficulty urinating and dysuria.  Musculoskeletal:  Positive for gait problem. Negative for arthralgias, back pain, joint swelling and myalgias.  Neurological:  Negative for dizziness, tremors, seizures, syncope, facial asymmetry, speech difficulty, weakness, light-headedness, numbness and headaches.  Psychiatric/Behavioral:  Positive for confusion. Negative for agitation and behavioral problems.    Immunization History  Administered Date(s) Administered  . Influenza-Unspecified 08/05/2018, 09/29/2018, 07/02/2019, 08/21/2019, 08/11/2020  . Moderna SARS-COV2 Booster Vaccination 08/31/2020  . Moderna Sars-Covid-2 Vaccination 11/04/2019, 12/02/2019  . Pneumococcal Conjugate-13 09/28/2018  . Pneumococcal Polysaccharide-23 10/21/2017  . Tdap 02/23/2021   Pertinent  Health Maintenance Due  Topic Date Due  . INFLUENZA VACCINE  05/21/2021  . DEXA SCAN  Discontinued   Fall Risk  07/19/2020  Falls in the past year? 1  Number falls in past yr: 0  Injury with Fall? 0   Functional Status Survey:    Vitals:   07/19/21 1335  Weight: 150 lb 6.4 oz (68.2 kg)   Body mass index is 28.42 kg/m. Physical Exam Vitals and nursing note reviewed.  Constitutional:      General: She is not in acute distress.    Appearance: She is not diaphoretic.  HENT:     Head: Normocephalic and atraumatic.  Neck:     Vascular: No JVD.  Cardiovascular:     Rate and Rhythm: Normal rate and regular rhythm.     Heart sounds: No murmur heard. Pulmonary:     Effort: Pulmonary effort is normal. No respiratory distress.     Breath sounds: Normal breath sounds. No wheezing.  Abdominal:     General: Bowel sounds are normal. There is no distension.     Palpations: Abdomen is soft.     Tenderness: There is no abdominal tenderness.  Musculoskeletal:     Right lower leg: Edema (+1) present.     Left lower leg: Edema (+1) present.  Skin:    General: Skin is warm and dry.  Neurological:      General: No focal deficit present.     Mental Status: She is alert. Mental status is at baseline.  Psychiatric:        Mood and Affect: Mood normal.    Labs reviewed: Recent Labs    04/06/21 0000 05/17/21 0000 06/28/21 0000  NA 136* 135* 140  K 4.2 5.8* 4.4  CL 102 101 105  CO2 21 20 25*  BUN 14 16 15   CREATININE 0.8 1.0 0.6  CALCIUM 9.6 9.7 9.1   Recent Labs    07/20/20 0000 06/28/21 0000  AST 18 17  ALT 13 12  ALKPHOS  --  98  ALBUMIN 4.2 3.9   Recent Labs    10/18/20 0000 04/06/21 0000 05/17/21 0000  WBC 7.7 6.1 7.1  HGB 11.6* 13.1 14.2  HCT 34* 39 43  PLT 275 202 186   Lab Results  Component Value Date   TSH 1.13 07/20/2020   No results found for: HGBA1C Lab Results  Component Value Date   CHOL 235 (A) 07/20/2020   HDL 100 (  A) 07/20/2020   LDLCALC 134 07/20/2020   TRIG 102 07/20/2020    Significant Diagnostic Results in last 30 days:  No results found.  Assessment/Plan  1. Late onset Alzheimer's dementia without behavioral disturbance (HCC) MMSE 21/30 07/21/20, check annually Doing well in the skilled care unit and continue to benefit from their support.   2. Essential hypertension Not currently on meds,  BP ok ON flomax for #4  3. Recurrent UTI Continue macrodantin 50 mg bid as indicated by ID notes  4. Neurogenic bladder Followed by urology Currently on Flomax and UTI prevention with macrodantin and supplements.   5. Slow transit constipation Controlled Continue senokot s bid  6. Impaired mobility Uses walker with ambulation to prevent falls. Has a wide based shuffle gait.  7. Spinal stenosis, unspecified spinal region No current pain Has advil and tylenol for pain relief.   Family/ staff Communication: nurse  Labs/tests ordered:  NA

## 2021-08-02 ENCOUNTER — Inpatient Hospital Stay (HOSPITAL_COMMUNITY)
Admission: EM | Admit: 2021-08-02 | Discharge: 2021-08-05 | DRG: 689 | Disposition: A | Discharge status: Skilled Nursing Facility | Payer: Medicare Other | Source: Skilled Nursing Facility | Attending: Internal Medicine | Admitting: Internal Medicine

## 2021-08-02 ENCOUNTER — Emergency Department (HOSPITAL_COMMUNITY): Payer: Medicare Other

## 2021-08-02 DIAGNOSIS — Z8616 Personal history of COVID-19: Secondary | ICD-10-CM

## 2021-08-02 DIAGNOSIS — M4800 Spinal stenosis, site unspecified: Secondary | ICD-10-CM | POA: Diagnosis present

## 2021-08-02 DIAGNOSIS — N312 Flaccid neuropathic bladder, not elsewhere classified: Secondary | ICD-10-CM | POA: Diagnosis present

## 2021-08-02 DIAGNOSIS — N390 Urinary tract infection, site not specified: Secondary | ICD-10-CM | POA: Diagnosis not present

## 2021-08-02 DIAGNOSIS — G9341 Metabolic encephalopathy: Secondary | ICD-10-CM | POA: Diagnosis not present

## 2021-08-02 DIAGNOSIS — Z882 Allergy status to sulfonamides status: Secondary | ICD-10-CM

## 2021-08-02 DIAGNOSIS — Z87440 Personal history of urinary (tract) infections: Secondary | ICD-10-CM

## 2021-08-02 DIAGNOSIS — F0390 Unspecified dementia without behavioral disturbance: Secondary | ICD-10-CM | POA: Diagnosis present

## 2021-08-02 DIAGNOSIS — I4510 Unspecified right bundle-branch block: Secondary | ICD-10-CM | POA: Diagnosis present

## 2021-08-02 DIAGNOSIS — I69320 Aphasia following cerebral infarction: Secondary | ICD-10-CM

## 2021-08-02 DIAGNOSIS — F02C0 Dementia in other diseases classified elsewhere, severe, without behavioral disturbance, psychotic disturbance, mood disturbance, and anxiety: Secondary | ICD-10-CM | POA: Diagnosis present

## 2021-08-02 DIAGNOSIS — G309 Alzheimer's disease, unspecified: Secondary | ICD-10-CM | POA: Diagnosis present

## 2021-08-02 DIAGNOSIS — R2990 Unspecified symptoms and signs involving the nervous system: Secondary | ICD-10-CM

## 2021-08-02 DIAGNOSIS — M1990 Unspecified osteoarthritis, unspecified site: Secondary | ICD-10-CM | POA: Diagnosis present

## 2021-08-02 DIAGNOSIS — R4182 Altered mental status, unspecified: Secondary | ICD-10-CM | POA: Diagnosis not present

## 2021-08-02 DIAGNOSIS — Z66 Do not resuscitate: Secondary | ICD-10-CM | POA: Diagnosis present

## 2021-08-02 LAB — URINALYSIS, ROUTINE W REFLEX MICROSCOPIC
Bilirubin Urine: NEGATIVE
Glucose, UA: NEGATIVE mg/dL
Ketones, ur: 5 mg/dL — AB
Leukocytes,Ua: NEGATIVE
Nitrite: NEGATIVE
Protein, ur: NEGATIVE mg/dL
Specific Gravity, Urine: 1.01 (ref 1.005–1.030)
pH: 7 (ref 5.0–8.0)

## 2021-08-02 LAB — CBC WITH DIFFERENTIAL/PLATELET
Abs Immature Granulocytes: 0.02 10*3/uL (ref 0.00–0.07)
Basophils Absolute: 0.1 10*3/uL (ref 0.0–0.1)
Basophils Relative: 2 %
Eosinophils Absolute: 0 10*3/uL (ref 0.0–0.5)
Eosinophils Relative: 0 %
HCT: 45.7 % (ref 36.0–46.0)
Hemoglobin: 14.3 g/dL (ref 12.0–15.0)
Immature Granulocytes: 0 %
Lymphocytes Relative: 13 %
Lymphs Abs: 0.7 10*3/uL (ref 0.7–4.0)
MCH: 28.1 pg (ref 26.0–34.0)
MCHC: 31.3 g/dL (ref 30.0–36.0)
MCV: 90 fL (ref 80.0–100.0)
Monocytes Absolute: 0.6 10*3/uL (ref 0.1–1.0)
Monocytes Relative: 10 %
Neutro Abs: 4 10*3/uL (ref 1.7–7.7)
Neutrophils Relative %: 75 %
Platelets: 169 10*3/uL (ref 150–400)
RBC: 5.08 MIL/uL (ref 3.87–5.11)
RDW: 13.1 % (ref 11.5–15.5)
WBC: 5.4 10*3/uL (ref 4.0–10.5)
nRBC: 0 % (ref 0.0–0.2)

## 2021-08-02 LAB — COMPREHENSIVE METABOLIC PANEL
ALT: 18 U/L (ref 0–44)
AST: 26 U/L (ref 15–41)
Albumin: 3.5 g/dL (ref 3.5–5.0)
Alkaline Phosphatase: 80 U/L (ref 38–126)
Anion gap: 9 (ref 5–15)
BUN: 8 mg/dL (ref 8–23)
CO2: 24 mmol/L (ref 22–32)
Calcium: 9.2 mg/dL (ref 8.9–10.3)
Chloride: 102 mmol/L (ref 98–111)
Creatinine, Ser: 0.77 mg/dL (ref 0.44–1.00)
GFR, Estimated: >60 mL/min (ref >60)
Glucose, Bld: 107 mg/dL — ABNORMAL HIGH (ref 70–99)
Potassium: 3.8 mmol/L (ref 3.5–5.1)
Sodium: 135 mmol/L (ref 135–145)
Total Bilirubin: 0.9 mg/dL (ref 0.3–1.2)
Total Protein: 7 g/dL (ref 6.5–8.1)

## 2021-08-02 LAB — APTT: aPTT: 33 seconds (ref 24–36)

## 2021-08-02 MED ORDER — ENOXAPARIN SODIUM 40 MG/0.4ML IJ SOSY
40.0000 mg | PREFILLED_SYRINGE | INTRAMUSCULAR | Status: DC
  Administered 2021-08-03 – 2021-08-05 (×3): 40 mg via SUBCUTANEOUS
  Filled 2021-08-02 (×3): qty 0.4

## 2021-08-02 MED ORDER — LACTATED RINGERS IV BOLUS
1000.0000 mL | Freq: Once | INTRAVENOUS | Status: AC
Start: 2021-08-02 — End: 2021-08-03
  Administered 2021-08-02: 1000 mL via INTRAVENOUS

## 2021-08-02 MED ORDER — SODIUM CHLORIDE 0.9 % IV SOLN: 1.0000 g | Freq: Three times a day (TID) | INTRAVENOUS | Status: DC

## 2021-08-02 MED ORDER — SENNOSIDES-DOCUSATE SODIUM 8.6-50 MG PO TABS: 1.0000 | ORAL_TABLET | Freq: Every evening | ORAL | Status: DC | PRN

## 2021-08-02 MED ORDER — ONDANSETRON HCL 4 MG/2ML IJ SOLN: 4.0000 mg | Freq: Four times a day (QID) | INTRAMUSCULAR | Status: DC | PRN

## 2021-08-02 MED ORDER — LACTATED RINGERS IV SOLN: INTRAVENOUS | Status: AC

## 2021-08-02 MED ORDER — ACETAMINOPHEN 325 MG PO TABS
650.0000 mg | ORAL_TABLET | Freq: Four times a day (QID) | ORAL | Status: DC | PRN
  Administered 2021-08-03: 650 mg via ORAL
  Filled 2021-08-02: qty 2

## 2021-08-02 MED ORDER — ONDANSETRON HCL 4 MG PO TABS: 4.0000 mg | ORAL_TABLET | Freq: Four times a day (QID) | ORAL | Status: DC | PRN

## 2021-08-02 MED ORDER — ACETAMINOPHEN 650 MG RE SUPP: 650.0000 mg | Freq: Four times a day (QID) | RECTAL | Status: DC | PRN

## 2021-08-02 MED ORDER — SODIUM CHLORIDE 0.9 % IV SOLN
1.0000 g | Freq: Once | INTRAVENOUS | Status: AC
  Administered 2021-08-02: 1 g via INTRAVENOUS
  Filled 2021-08-02: qty 10

## 2021-08-02 MED ORDER — SODIUM CHLORIDE 0.9 % IV SOLN: 1.0000 g | INTRAVENOUS | Status: DC

## 2021-08-02 NOTE — ED Notes (Signed)
Pt transported to CT ?

## 2021-08-02 NOTE — H&P (Signed)
History and Physical    Cheryl Monroe YNW:[id number] DOB: 06-17-1935 DOA: 08/02/2021  PCP: Mahlon Gammon, MD  Patient coming from: [facility]  I have personally briefly reviewed patient's old medical records in [facility]  Chief Complaint: Altered mental status  HPI: Cheryl Monroe is a 86 y.o. 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  History is limited from patient due to Alzheimer's dementia and is otherwise supplemented by EDP, chart review, and patient's son at bedside.  Patient has advanced dementia and currently resides at [facility].  She has short-term memory deficits, mild expressive aphasia, and gait imbalance requiring use of a walker with ambulation at baseline.  Patient's son states that she received the Moderna COVID-vaccine in the last week.  She appeared to be more lethargic when her son saw her 2 days ago but was otherwise at her baseline mental status.  Around 12 PM today (08/02/2021) patient was noted to have altered mental status from her baseline with slurred speech and worsening balance issues.  EMS were called at 6 PM and on their arrival and evaluation patient was at her baseline without any apparent acute deficits.  Patient was subsequently brought to the ED for further evaluation.  Patient's son states that she has a history of frequent UTI due to atonic bladder with incomplete emptying which present atypically without obvious fevers or leukocytosis.  He says she was hospitalized multiple times for urosepsis when she used to live in [place].  She has otherwise been doing well from this standpoint since she was started on prophylactic  nitrofurantoin daily.  Last urine culture 05/18/2021 grew Pseudomonas (sensitive to meropenem, intermediate susceptibility to cefepime).  Urine culture 02/02/2021 grew Klebsiella variicola sensitive to ceftriaxone.  Patient currently denies any subjective fevers, chills, diaphoresis, chest pain, palpitations, dyspnea, nausea, vomiting, abdominal pain.  She reports rare cough.  She does express difficulty with urine output but denies dysuria.  ED Course:  Initial vitals showed BP 136/46, pulse 73, RR 14, temp 99.9 F, SPO2 100% on room air.  Labs show WBC 5.4, hemoglobin 14.3, platelets 169,000, sodium 135, potassium 3.8, bicarb 24, BUN 8, creatinine 0.77, serum glucose 107, LFTs within normal limits.  Urinalysis showed negative nitrates, negative leukocytes, 0-5 RBC/hpf, 0-5 WBC/hpf, many bacteria on microscopy.  Urine culture collected and pending.  Blood cultures ordered and pending.  CT head without contrast is limited due to patient motion.  Hypodensities throughout the periventricular and subcortical white matter are seen, most consistent with chronic small vessel ischemic changes.  Chronic left cerebellar infarct noted.  No acute infarct or hemorrhage seen.  Patient was given 1 L LR and IV ceftriaxone.  Due to family observation that patient has not returned to her baseline the hospitalist service was consulted for further evaluation and management.  Review of Systems:  All systems reviewed and are negative except as documented in history of present illness above.   Past Medical History:  Diagnosis Date   Alzheimer disease (HCC)    Balance problem 07/19/2020   Constipation 07/19/2020   Dementia without behavioral disturbance (HCC) 07/19/2020   MMSE 21/30 07/21/20   Fall    Osteoarthritis    Pyelonephritis    Spinal stenosis 07/19/2020   Weakness of left lower  extremity 07/19/2020    No past surgical history on file.  Social History:  reports that she has never smoked. She has  never used smokeless tobacco. She reports that she does not currently use alcohol. She reports that she does not currently use drugs.  Allergies  Allergen Reactions   Sulfa Antibiotics Rash    Rash to trunk, legs, neck, scalp, and arms    Family History  Problem Relation Age of Onset   ALS Mother      Prior to Admission medications   Medication Sig Start Date End Date Taking? Authorizing Provider  acetaminophen (TYLENOL) 500 MG tablet Take 1,000 mg by mouth 3 (three) times daily as needed.    [provider]  Cranberry (THERACRAN PO) Take 250 mg by mouth in the morning and at bedtime.    [provider]  D-Mannose POWD Take 4 capsules by mouth daily at 12 noon. 99%    [provider]  docusate sodium (COLACE) 100 MG capsule Take 100 mg by mouth 2 (two) times daily as needed.     [provider]  estradiol (ESTRING) 2 MG vaginal ring Place 2 mg vaginally every 3 (three) months. follow package directions 07/19/20   Renato Gails, Tiffany L, DO  ibuprofen (ADVIL) 200 MG tablet Take 400 mg by mouth 3 (three) times daily as needed.    [provider]  nitrofurantoin (MACRODANTIN) 50 MG capsule Take 50 mg by mouth 2 (two) times daily.    [provider]  senna (SENOKOT) 8.6 MG TABS tablet Take 2 tablets by mouth in the morning and at bedtime.    [provider]  tamsulosin (FLOMAX) 0.4 MG CAPS capsule Take 0.4 mg by mouth at bedtime.    [provider]    Physical Exam: Vitals:   08/02/21 2115 08/02/21 2145 08/02/21 2215 08/02/21 2230  BP: (!) 151/97 140/60 134/66 (!) 104/47  Pulse: 63 63 62 64  Resp: 16 (!) 23 (!) 22 (!) 30  Temp:      TempSrc:      SpO2: 96% 97% 94% 94%   Constitutional: Resting supine in bed, NAD, calm, comfortable Eyes: PERRL, lids and conjunctivae normal ENMT: Mucous membranes are dry. Posterior pharynx clear of any exudate or lesions.Normal dentition.  Neck: normal, supple, no masses. Respiratory:  clear to auscultation bilaterally, no wheezing, no crackles. Normal respiratory effort. No accessory muscle use.  Cardiovascular: Regular rate and rhythm, no murmurs / rubs / gallops.  Trace bilateral lower extremity edema (chronic per patient's son). 2+ pedal pulses. Abdomen: no tenderness, no masses palpated. No hepatosplenomegaly. Bowel sounds positive.  Musculoskeletal: no clubbing / cyanosis. No joint deformity upper and lower extremities. Good ROM, no contractures. Normal muscle tone.  Skin: no rashes, lesions, ulcers. No induration Neurologic: Some expressive aphasia which is chronic per family otherwise CN 2-12 grossly intact. Sensation intact. Strength 5/5 in all 4.  Psychiatric: Awake, alert, oriented to self.  She knows she is in the hospital, the current year, and recognizes her son at bedside.  She does exhibit short-term memory deficits as she is repeating same questions.    Labs on Admission: I have personally reviewed following labs and imaging studies  CBC: Recent Labs  Lab 08/02/21 1926  WBC 5.4  NEUTROABS 4.0  HGB 14.3  HCT 45.7  MCV 90.0  PLT 169   Basic Metabolic Panel: Recent Labs  Lab 08/02/21 1926  NA 135  K 3.8  CL 102  CO2 24  GLUCOSE 107*  BUN 8  CREATININE 0.77  CALCIUM 9.2   GFR: CrCl cannot be calculated (Unknown ideal weight.). Liver Function Tests: Recent Labs  Lab 08/02/21 1926  AST 26  ALT 18  ALKPHOS 80  BILITOT 0.9  PROT 7.0  ALBUMIN 3.5   No results for input(s): LIPASE, AMYLASE in the last 168 hours. No results for input(s): AMMONIA in the last 168 hours. Coagulation Profile: No results for input(s): INR, PROTIME in the last 168 hours. Cardiac Enzymes: No results for input(s): CKTOTAL, CKMB, CKMBINDEX, TROPONINI in the last 168 hours. BNP (last 3 results) No results for input(s): PROBNP in the last 8760 hours. HbA1C: No results for input(s): HGBA1C in the last 72 hours. CBG: No results for input(s): GLUCAP in the last 168  hours. Lipid Profile: No results for input(s): CHOL, HDL, LDLCALC, TRIG, CHOLHDL, LDLDIRECT in the last 72 hours. Thyroid Function Tests: No results for input(s): TSH, T4TOTAL, FREET4, T3FREE, THYROIDAB in the last 72 hours. Anemia Panel: No results for input(s): VITAMINB12, FOLATE, FERRITIN, TIBC, IRON, RETICCTPCT in the last 72 hours. Urine analysis:    Component Value Date/Time   COLORURINE YELLOW 08/02/2021 1926   APPEARANCEUR CLEAR 08/02/2021 1926   LABSPEC 1.010 08/02/2021 1926   PHURINE 7.0 08/02/2021 1926   GLUCOSEU NEGATIVE 08/02/2021 1926   HGBUR SMALL (A) 08/02/2021 1926   BILIRUBINUR NEGATIVE 08/02/2021 1926   KETONESUR 5 (A) 08/02/2021 1926   PROTEINUR NEGATIVE 08/02/2021 1926   NITRITE NEGATIVE 08/02/2021 1926   LEUKOCYTESUR NEGATIVE 08/02/2021 1926    Radiological Exams on Admission: CT HEAD WO CONTRAST ( )  Result Date: 08/02/2021 CLINICAL DATA:  TIA symptoms EXAM: CT HEAD WITHOUT CONTRAST TECHNIQUE: Contiguous axial images were obtained from the base of the skull through the vertex without intravenous contrast. COMPARISON:  None. FINDINGS: Brain: Patient motion limits evaluation of the skull base. There are scattered hypodensities throughout the periventricular and subcortical white matter most consistent with age-indeterminate small vessel ischemic change. Chronic infarct superior left cerebellar hemisphere. No evidence of acute hemorrhage. Lateral ventricles and remaining midline structures are unremarkable. No acute extra-axial fluid collections. No mass effect. Vascular: Diffuse atherosclerosis of the internal carotid arteries and basilar artery. No hyperdense vessel. Skull: Normal. Negative for fracture or focal lesion. Sinuses/Orbits: No acute finding. Other: None. IMPRESSION: 1. Patient motion limits evaluation. 2. Hypodensities throughout the periventricular and subcortical white matter, most consistent with chronic small vessel ischemic change. Chronic left  cerebellar infarct. 3. Otherwise no acute infarct or hemorrhage. Electronically Signed   By: Sharlet Salina M.D.   On: 08/02/2021 20:46    EKG: Personally reviewed. Normal sinus rhythm, RBBB.  No prior for comparison.  Assessment/Plan Principal Problem:   Acute metabolic encephalopathy Active Problems:   Recurrent UTI   Dementia without behavioral disturbance (HCC)   Cheryl Monroe is a 86 y.o. female with medical history significant for advanced Alzheimer's dementia, history of CVA with residual dysarthria and gait imbalance, neurogenic bladder with recurrent UTI on chronic prophylactic antibiotic (nitrofurantoin) who is admitted with acute metabolic encephalopathy.  Acute metabolic encephalopathy in patient with advanced Alzheimer's dementia: Patient presenting with increased speech difficulty, confusion, and gait difficulty compared to her baseline per son.  This is similar to her prior atypical UTI presentation.  She is not back to her baseline per son.  Urinalysis shows many bacteria, negative nitrites and leukocytes. -Continue empiric IV meropenem given most recent urine culture growing Pseudomonas (05/18/2021 in Care Everywhere) -Follow urine and blood cultures and narrow  antibiotics as able -Start gentle IV fluid hydration overnight -Delirium precautions  Neurogenic bladder with incomplete emptying and history of recurrent UTI: Has been on chronic prophylaxis with nitrofurantoin which is currently on hold while on IV ceftriaxone as well.  Monitor urine output, bladder scan/In-N-Out cath as needed.  Continue Flomax.  History of CVA with residual mild expressive aphasia and gait imbalance: Chronic symptoms, more pronounced from baseline likely secondary to presumed UTI.  No focal deficits on admission.  Not currently on aspirin or statin. -Continue management as above -PT/OT eval  DVT prophylaxis: Lovenox Code Status: DNR/DNI, confirmed with son at bedside Family Communication:  Discussed with patient's son at bedside Disposition Plan: From SNF and likely return to SNF pending clinical progress Consults called: None Level of care: Med-Surg Admission status:  Status is: Observation  The patient remains OBS appropriate and will d/c before 2 midnights.  Darreld Mclean MD [facility]  If 7PM-7AM, please contact night-coverage [email or web address]  08/02/2021, 11:04 PM

## 2021-08-02 NOTE — Discharge Instructions (Signed)
All your testing today was reassuring.  Please follow-up with your primary care doctor regarding your transient changes in speech.  Continue to take all medications as prescribed.  Return to the ED with new or worsening symptoms including any slurred speech, facial droop, or weakness in 1 side of the body.  Refer to the attached documents regarding signs of stroke.

## 2021-08-02 NOTE — ED Provider Notes (Addendum)
[facility] EMERGENCY DEPARTMENT Provider Note   CSN: [id number] Arrival date & time: 08/02/21  1902     History Chief Complaint  Patient presents with   Altered Mental Status    Cheryl Monroe is a 86 y.o. female.  HPI This is an 86 year old female with history of severe Alzheimer's disease, prior stroke with residual gait deficit, frequent UTIs who presents with reported altered mental status.  Patient reportedly had some slurred speech and balance issues today around 12 noon.  EMS was called at 6 PM and on their arrival noticed no deficits and facility reported patient was back at her baseline.  Unknown duration of reported deficits.  No other history provided from facility.  Patient unable to provide additional history secondary to dementia.    Past Medical History:  Diagnosis Date   Alzheimer disease (HCC)    Balance problem 07/19/2020   Constipation 07/19/2020   Dementia without behavioral disturbance (HCC) 07/19/2020   MMSE 21/30 07/21/20   Fall    Osteoarthritis    Pyelonephritis    Spinal stenosis 07/19/2020   Weakness of left lower extremity 07/19/2020    Patient Active Problem List   Diagnosis Date Noted   Word finding difficulty 02/22/2021   History of COVID-19 07/26/2020   Orthostatic dizziness 07/19/2020   Balance problem 07/19/2020   Constipation 07/19/2020   Clitoral irritation 07/19/2020   Asymptomatic bacteriuria 07/19/2020   Urinary frequency 07/19/2020   Urge incontinence 07/19/2020   Weakness of left lower extremity 07/19/2020   Essential hypertension 07/19/2020   Weakness 07/19/2020   Impaired mobility 07/19/2020   Recurrent UTI 07/19/2020   Dementia without behavioral disturbance (HCC) 07/19/2020   Edema 07/19/2020   Spinal stenosis 07/19/2020    No past surgical history on file.   OB History   No obstetric history on file.     Family History  Problem Relation Age of Onset   ALS Mother     Social History   Tobacco  Use   Smoking status: Never   Smokeless tobacco: Never  Vaping Use   Vaping Use: Never used  Substance Use Topics   Alcohol use: Not Currently   Drug use: Not Currently    Home Medications Prior to Admission medications   Medication Sig Start Date End Date Taking? Authorizing Provider  acetaminophen (TYLENOL) 500 MG tablet Take 1,000 mg by mouth 3 (three) times daily as needed.    [provider]  Cranberry (THERACRAN PO) Take 250 mg by mouth in the morning and at bedtime.    [provider]  D-Mannose POWD Take 4 capsules by mouth daily at 12 noon. 99%    [provider]  docusate sodium (COLACE) 100 MG capsule Take 100 mg by mouth 2 (two) times daily as needed.     [provider]  estradiol (ESTRING) 2 MG vaginal ring Place 2 mg vaginally every 3 (three) months. follow package directions 07/19/20   Renato Gails, Tiffany L, DO  ibuprofen (ADVIL) 200 MG tablet Take 400 mg by mouth 3 (three) times daily as needed.    [provider]  nitrofurantoin (MACRODANTIN) 50 MG capsule Take 50 mg by mouth 2 (two) times daily.    [provider]  senna (SENOKOT) 8.6 MG TABS tablet Take 2 tablets by mouth in the morning and at bedtime.    [provider]  tamsulosin (FLOMAX) 0.4 MG CAPS capsule Take 0.4 mg by mouth at bedtime.    [provider]  Allergies    Sulfa antibiotics  Review of Systems   Review of Systems  Unable to perform ROS: Dementia   Physical Exam Updated Vital Signs BP (!) 151/97   Pulse 63   Temp 99.9 F (37.7 C) (Oral)   Resp 16   SpO2 96%   Physical Exam Vitals and nursing note reviewed.  Constitutional:      General: She is not in acute distress.    Appearance: She is well-developed.  HENT:     Head: Normocephalic and atraumatic.  Eyes:     Extraocular Movements: Extraocular movements intact.     Conjunctiva/sclera: Conjunctivae normal.     Pupils: Pupils are equal, round, and reactive to  light.  Cardiovascular:     Rate and Rhythm: Normal rate and regular rhythm.     Heart sounds: No murmur heard. Pulmonary:     Effort: Pulmonary effort is normal. No respiratory distress.     Breath sounds: Normal breath sounds.  Abdominal:     Palpations: Abdomen is soft.     Tenderness: There is no abdominal tenderness.  Musculoskeletal:     Cervical back: Neck supple. No rigidity or tenderness.  Skin:    General: Skin is warm and dry.  Neurological:     General: No focal deficit present.     Mental Status: She is alert.     Cranial Nerves: No cranial nerve deficit.     Sensory: No sensory deficit.     Motor: No weakness.     Coordination: Coordination normal.    ED Results / Procedures / Treatments   Labs (all labs ordered are listed, but only abnormal results are displayed) Labs Reviewed  URINALYSIS, ROUTINE W REFLEX MICROSCOPIC - Abnormal; Notable for the following components:      Result Value   Hgb urine dipstick SMALL (*)    Ketones, ur 5 (*)    Bacteria, UA MANY (*)    All other components within normal limits  COMPREHENSIVE METABOLIC PANEL - Abnormal; Notable for the following components:   Glucose, Bld 107 (*)    All other components within normal limits  CBC WITH DIFFERENTIAL/PLATELET  APTT    EKG EKG Interpretation  Date/Time:  Thursday August 02 2021 19:36:12 EDT Ventricular Rate:  67 PR Interval:  163 QRS Duration: 142 QT Interval:  440 QTC Calculation: 465 R Axis:   82 Text Interpretation: Sinus rhythm Right bundle branch block Confirmed by Cathren Laine ([id number]) on 08/02/2021 7:47:46 PM  Radiology CT HEAD WO CONTRAST ( )  Result Date: 08/02/2021 CLINICAL DATA:  TIA symptoms EXAM: CT HEAD WITHOUT CONTRAST TECHNIQUE: Contiguous axial images were obtained from the base of the skull through the vertex without intravenous contrast. COMPARISON:  None. FINDINGS: Brain: Patient motion limits evaluation of the skull base. There are scattered  hypodensities throughout the periventricular and subcortical white matter most consistent with age-indeterminate small vessel ischemic change. Chronic infarct superior left cerebellar hemisphere. No evidence of acute hemorrhage. Lateral ventricles and remaining midline structures are unremarkable. No acute extra-axial fluid collections. No mass effect. Vascular: Diffuse atherosclerosis of the internal carotid arteries and basilar artery. No hyperdense vessel. Skull: Normal. Negative for fracture or focal lesion. Sinuses/Orbits: No acute finding. Other: None. IMPRESSION: 1. Patient motion limits evaluation. 2. Hypodensities throughout the periventricular and subcortical white matter, most consistent with chronic small vessel ischemic change. Chronic left cerebellar infarct. 3. Otherwise no acute infarct or hemorrhage. Electronically Signed   By: Sharlet Salina M.D.   On:  08/02/2021 20:46    Procedures Procedures   Medications Ordered in ED Medications - No data to display  ED Course  I have reviewed the triage vital signs and the nursing notes.  Pertinent labs & imaging results that were available during my care of the patient were reviewed by me and considered in my medical decision making (see chart for details).    MDM Rules/Calculators/A&P                          Patient is hemodynamically stable and well-appearing on exam.  Oriented only to self but otherwise has a nonfocal neurologic exam.  No slurred speech and no deficits with coordination testing.  At her baseline per facility report.  No signs of infection on exam, however patient has a history of UTI temp is 99.9. No cough and lungs are clear, no evidence of pulmonary pathology on exam.   Items on the ddx for transient neurologic deficits include CVA/TIA, UTI, infection, dehydration, or metabolic disturbance. Will evaluate transient neurologic deficits with basic labs, urine, and CT head.    Labs unremarkable. No elevated WBC to  indicate occult infection. CT head without acute pathology. Unable to exclude TIA as cause of symptoms, but given patients advanced dementia likely would not benefit from admission for further TIA workup.   Patients son at bedside who states she is not at her baseline. He reports she is more somnolent and confused that she typically is. Given it is now 2230 could be 2/2 known hx of dementia but son insists this presentation is similar to prior presentations of urosepsis. Will order ceftriaxone, fluids and consult for admission.   Final Clinical Impression(s) / ED Diagnoses Final diagnoses:  Episode of transient neurologic symptoms    Rx / DC Orders ED Discharge Orders     None         Doran Clay, MD 08/02/21 2310    Cathren Laine, MD 08/06/21 [phone]

## 2021-08-02 NOTE — ED Triage Notes (Addendum)
Patient BIBEMS from [facility], patient has hx of stroke with altered speech and gait at baseline. Per EMS staff reports around 1200 patient has increased difficulty speaking and increase in impaired gait. EMS called around 1800, staff reported upon EMS arrival patient was back to baseline.   BP:118/72 HR: 74

## 2021-08-03 DIAGNOSIS — I4510 Unspecified right bundle-branch block: Secondary | ICD-10-CM | POA: Diagnosis present

## 2021-08-03 DIAGNOSIS — Z87440 Personal history of urinary (tract) infections: Secondary | ICD-10-CM | POA: Diagnosis not present

## 2021-08-03 DIAGNOSIS — F02C0 Dementia in other diseases classified elsewhere, severe, without behavioral disturbance, psychotic disturbance, mood disturbance, and anxiety: Secondary | ICD-10-CM | POA: Diagnosis present

## 2021-08-03 DIAGNOSIS — G309 Alzheimer's disease, unspecified: Secondary | ICD-10-CM | POA: Diagnosis present

## 2021-08-03 DIAGNOSIS — N312 Flaccid neuropathic bladder, not elsewhere classified: Secondary | ICD-10-CM | POA: Diagnosis present

## 2021-08-03 DIAGNOSIS — Z66 Do not resuscitate: Secondary | ICD-10-CM | POA: Diagnosis present

## 2021-08-03 DIAGNOSIS — N390 Urinary tract infection, site not specified: Secondary | ICD-10-CM | POA: Diagnosis present

## 2021-08-03 DIAGNOSIS — I69320 Aphasia following cerebral infarction: Secondary | ICD-10-CM | POA: Diagnosis not present

## 2021-08-03 DIAGNOSIS — M1990 Unspecified osteoarthritis, unspecified site: Secondary | ICD-10-CM | POA: Diagnosis present

## 2021-08-03 DIAGNOSIS — Z8616 Personal history of COVID-19: Secondary | ICD-10-CM | POA: Diagnosis not present

## 2021-08-03 DIAGNOSIS — R4182 Altered mental status, unspecified: Secondary | ICD-10-CM | POA: Diagnosis present

## 2021-08-03 DIAGNOSIS — G9341 Metabolic encephalopathy: Secondary | ICD-10-CM | POA: Diagnosis present

## 2021-08-03 DIAGNOSIS — Z882 Allergy status to sulfonamides status: Secondary | ICD-10-CM | POA: Diagnosis not present

## 2021-08-03 DIAGNOSIS — M4800 Spinal stenosis, site unspecified: Secondary | ICD-10-CM | POA: Diagnosis present

## 2021-08-03 LAB — CBC
HCT: 38.4 % (ref 36.0–46.0)
Hemoglobin: 12.5 g/dL (ref 12.0–15.0)
MCH: 28.8 pg (ref 26.0–34.0)
MCHC: 32.6 g/dL (ref 30.0–36.0)
MCV: 88.5 fL (ref 80.0–100.0)
Platelets: 151 10*3/uL (ref 150–400)
RBC: 4.34 MIL/uL (ref 3.87–5.11)
RDW: 13.2 % (ref 11.5–15.5)
WBC: 4.8 10*3/uL (ref 4.0–10.5)
nRBC: 0 % (ref 0.0–0.2)

## 2021-08-03 LAB — BASIC METABOLIC PANEL
Anion gap: 9 (ref 5–15)
BUN: 8 mg/dL (ref 8–23)
CO2: 25 mmol/L (ref 22–32)
Calcium: 8.3 mg/dL — ABNORMAL LOW (ref 8.9–10.3)
Chloride: 101 mmol/L (ref 98–111)
Creatinine, Ser: 0.73 mg/dL (ref 0.44–1.00)
GFR, Estimated: >60 mL/min (ref >60)
Glucose, Bld: 91 mg/dL (ref 70–99)
Potassium: 3.6 mmol/L (ref 3.5–5.1)
Sodium: 135 mmol/L (ref 135–145)

## 2021-08-03 LAB — RESP PANEL BY RT-PCR (FLU A&B, COVID) ARPGX2
Influenza A by PCR: NEGATIVE
Influenza B by PCR: NEGATIVE
SARS Coronavirus 2 by RT PCR: NEGATIVE

## 2021-08-03 MED ORDER — SODIUM CHLORIDE 0.9 % IV SOLN
1.0000 g | Freq: Two times a day (BID) | INTRAVENOUS | Status: DC
  Administered 2021-08-03 – 2021-08-05 (×5): 1 g via INTRAVENOUS
  Filled 2021-08-03 (×6): qty 1

## 2021-08-03 MED ORDER — TAMSULOSIN HCL 0.4 MG PO CAPS
0.4000 mg | ORAL_CAPSULE | Freq: Every day | ORAL | Status: DC
  Administered 2021-08-03 – 2021-08-04 (×2): 0.4 mg via ORAL
  Filled 2021-08-03 (×2): qty 1

## 2021-08-03 NOTE — Progress Notes (Signed)
PT Evaluation   08/03/21 1444  PT Visit Information  Last PT Received On 08/03/21  Assistance Needed +1  PT/OT/SLP Co-Evaluation/Treatment Yes  Reason for Co-Treatment For patient/therapist safety;To address functional/ADL transfers  PT goals addressed during session Mobility/safety with mobility;Balance  History of Present Illness This 86 y.o. female admitted AMS.  CT of brain showed: Hypodensities throughout the periventricular and subcortical white matter, most consistent with chronic small vessel ischemic change. Chronic left cerebellar infarct.  No acute infarct or hemorrhage.  Dx: possible UTI - work up underway.  PMH includes:  Alzheimer's disease, balance deficits, OA, spinal stenosis, weakness of Lt LE  Precautions  Precautions Fall  Restrictions  Weight Bearing Restrictions No  Home Living  Family/patient expects to be discharged to: Skilled nursing facility  Additional Comments Pt resides at [facility]  Prior Function  Comments Per notes, was using RW for ambulation and attending exercise class. No family to confirm  Communication  Communication Expressive difficulties  Pain Assessment  Pain Assessment No/denies pain  Cognition  Arousal/Alertness Awake/alert  Behavior During Therapy WFL for tasks assessed/performed  Overall Cognitive Status No family/caregiver present to determine baseline cognitive functioning  General Comments Pt has h/o Alzheimer's Disease.  Pt perseverated on need for bacon to be crispier.  Required frequent redirection back to task  Upper Extremity Assessment  Upper Extremity Assessment Defer to OT evaluation  Lower Extremity Assessment  Lower Extremity Assessment Generalized weakness  Cervical / Trunk Assessment  Cervical / Trunk Assessment Kyphotic  Bed Mobility  Overal bed mobility Needs Assistance  Bed Mobility Supine to Sit;Sit to Supine  Supine to sit Mod assist  Sit to supine Supervision  General bed mobility comments assist to  initate movement and assist to list trunk  Transfers  Overall transfer level Needs assistance  Equipment used Rolling walker (2 wheeled)  Transfers Sit to/from Stand  Sit to Stand +2 safety/equipment;Min assist;+2 physical assistance  Stand pivot transfers Min assist;+2 safety/equipment  General transfer comment +2 to stand from ED stretcher. Initially with posterior lean, but able to correct with cues.  Ambulation/Gait  Ambulation/Gait assistance Min assist;+2 safety/equipment  Gait Distance (Feet) 50 Feet  Assistive device Rolling walker (2 wheeled)  Gait Pattern/deviations Step-through pattern;Decreased stride length;Trunk flexed  General Gait Details Cues for proximity to device and manual assist for RW usage. Tended to drift to the L. Min A for steadying.  Gait velocity Decreased  Balance  Overall balance assessment Needs assistance  Sitting-balance support Feet unsupported  Sitting balance-Leahy Scale Fair  Standing balance support Bilateral upper extremity supported  Standing balance-Leahy Scale Poor  Standing balance comment requires UE support  PT - End of Session  Equipment Utilized During Treatment Gait belt  Activity Tolerance Patient tolerated treatment well  Patient left in bed;with call bell/phone within reach;with bed alarm set (on bed in ED)  Nurse Communication Mobility status  PT Assessment  PT Recommendation/Assessment Patient needs continued PT services  PT Visit Diagnosis Unsteadiness on feet (R26.81);Muscle weakness (generalized) (M62.81)  PT Problem List Decreased strength;Decreased activity tolerance;Decreased balance;Decreased mobility;Decreased cognition;Decreased knowledge of use of DME;Decreased safety awareness;Decreased knowledge of precautions  PT Plan  PT Frequency (ACUTE ONLY) Min 2X/week  PT Treatment/Interventions (ACUTE ONLY) DME instruction;Gait training;Functional mobility training;Therapeutic activities;Therapeutic exercise;Balance  training;Patient/family education;Cognitive remediation  AM-PAC PT "6 Clicks" Mobility Outcome Measure (Version 2)  Help needed turning from your back to your side while in a flat bed without using bedrails? 3  Help needed moving from lying on  your back to sitting on the side of a flat bed without using bedrails? 2  Help needed moving to and from a bed to a chair (including a wheelchair)? 3  Help needed standing up from a chair using your arms (e.g., wheelchair or bedside chair)? 1  Help needed to walk in hospital room? 3  Help needed climbing 3-5 steps with a railing?  2  6 Click Score 14  Consider Recommendation of Discharge To: CIR/SNF/LTACH  Progressive Mobility  What is the highest level of mobility based on the progressive mobility assessment? Level 5 (Walks with assist in room/hall) - Balance while stepping forward/back and can walk in room with assist - Complete  Mobility Ambulated with assistance in hallway  PT Recommendation  Follow Up Recommendations SNF  PT equipment None recommended by PT  Individuals Consulted  Consulted and Agree with Results and Recommendations Patient  Acute Rehab PT Goals  Patient Stated Goal to microwave the bacon  to make it crispy  PT Goal Formulation With patient  Time For Goal Achievement 08/17/21  Potential to Achieve Goals Good  PT Time Calculation  PT Start Time (ACUTE ONLY) 0955  PT Stop Time (ACUTE ONLY) 1022  PT Time Calculation (min) (ACUTE ONLY) 27 min  PT General Charges  $$ ACUTE PT VISIT 1 Visit  PT Evaluation  $PT Eval Moderate Complexity 1 Mod  Written Expression  Dominant Hand Right   Pt admitted secondary to problem above with deficits above. Pt with dementia at baseline and required redirection to tasks throughout session. Pt requiring min A +2 to stand and min A to ambulate in hall. Pt from SNF and recommend return at d/c with follow up PT services. Will continue to follow acutely.   Farley Ly, PT, DPT  Acute  Rehabilitation Services  Pager: [phone] Office: [phone]

## 2021-08-03 NOTE — ED Notes (Signed)
Dr. Sherryll Burger son [phone] requesting the status of the patient

## 2021-08-03 NOTE — ED Notes (Signed)
Son updated on the phone

## 2021-08-03 NOTE — Progress Notes (Signed)
PHARMACY NOTE:  ANTIMICROBIAL RENAL DOSAGE ADJUSTMENT  Current antimicrobial regimen includes a mismatch between antimicrobial dosage and estimated renal function.  As per policy approved by the Pharmacy & Therapeutics and Medical Executive Committees, the antimicrobial dosage will be adjusted accordingly.  Current antimicrobial dosage:  Meropenem 1gm IV q8h  Indication: UTI (h/o pseudomonal UTI)  Renal Function:  Estimated Creatinine Clearance: 49.4 mL/min (by C-G formula based on SCr of 0.77 mg/dL).     Antimicrobial dosage has been changed to:  Meropenem 1gm IV q12h   Thank you for allowing pharmacy to be a part of this patient's care.  Christoper Fabian, PharmD, BCPS Please see amion for complete clinical pharmacist phone list 08/03/2021 12:00 AM

## 2021-08-03 NOTE — ED Notes (Signed)
Dr. Sherryll Burger son [phone] requesting an update on the patient

## 2021-08-03 NOTE — ED Notes (Signed)
Pt's gown changed, face washed assisted by this RN. Pt resting comfortably at this time.

## 2021-08-03 NOTE — Progress Notes (Signed)
OT eval completed.  Recommend SNF level rehab at discharge to ensure return to baseline.  Full note to follow.  Eber Jones., OTR/L Acute Rehabilitation Services Pager [phone] Office [phone]

## 2021-08-03 NOTE — Progress Notes (Signed)
PROGRESS NOTE  Cheryl Monroe NUU:[id number] DOB: Mar 05, 1935 DOA: 08/02/2021 PCP: Mahlon Gammon, MD  HPI/Recap of past 24 hours: Cheryl Monroe is a 86 y.o. female with medical history significant for advanced Alzheimer's dementia, history of CVA with residual mild expressive aphasia and gait imbalance, neurogenic bladder with recurrent UTI on chronic prophylactic antibiotic (nitrofurantoin) who presented to the ED from her nursing facility for evaluation of altered mental status  X1 day.  History is limited from patient due to Alzheimer's dementia and is otherwise supplemented by EDP, chart review, and patient's son at bedside.  Patient has advanced dementia and currently resides at [facility].  She has short-term memory deficits, mild expressive aphasia, and gait imbalance requiring use of a walker with ambulation at baseline.  Patient's son states that she received the Moderna COVID-vaccine in the last week.  She appeared to be more lethargic when her son saw her 2 days ago but was otherwise at her baseline mental status.  Around 12 PM today (08/02/2021) patient was noted to have altered mental status from her baseline with slurred speech and worsening balance issues.  EMS were called at 6 PM and on their arrival and evaluation patient was at her baseline without any apparent acute deficits.  Patient was subsequently brought to the ED for further evaluation.    Last urine culture 05/18/2021 grew Pseudomonas (sensitive to meropenem, intermediate susceptibility to cefepime).  Urine culture 02/02/2021 grew Klebsiella variicola sensitive to ceftriaxone.  08/03/2021: Patient was seen and examined at her bedside in the ED.  She is drowsy however she follows commands and is easily arousable to voices.  She has no new complaints.     Assessment/Plan: Principal Problem:   Acute metabolic encephalopathy Active Problems:   Recurrent UTI   Dementia without behavioral  disturbance (HCC)  Acute metabolic encephalopathy in the setting of advanced Alzheimer dementia. Patient presenting with increased speech difficulty, confusion, and gait difficulty compared to her baseline per son.  This is similar to her prior atypical UTI presentation.  She is not back to her baseline per son.  Urinalysis shows many bacteria, negative nitrites and leukocytes. Continue empiric IV meropenem given most recent urine culture growing Pseudomonas (05/18/2021 in Care Everywhere) Follow-up urine culture for ID and sensitivities. -Follow urine and blood cultures and narrow antibiotics as able Continue delirium precautions.   Neurogenic bladder with incomplete emptying and history of recurrent UTI: Has been on chronic prophylaxis with nitrofurantoin which is currently on hold while on IV antibiotics.  Monitor urine output, bladder scan/In-N-Out cath as needed.   History of CVA with residual mild expressive aphasia and gait imbalance: Chronic symptoms, more pronounced from baseline likely secondary to presumed UTI.  No focal deficits on admission.  Not currently on aspirin or statin. -Continue management as above -PT/OT eval   DVT prophylaxis: Lovenox Code Status: DNR/DNI, confirmed with son at bedside Family Communication: Discussed with patient's son at bedside Disposition Plan: From SNF and likely return to SNF pending clinical progress Consults called: None Level of care: Med-Surg Admission status:  Status is: Observation      Objective: Vitals:   08/03/21 0800 08/03/21 0830 08/03/21 0900 08/03/21 0930  BP: (!) 148/67 (!) 148/65 (!) 167/73 (!) 177/64  Pulse: (!) 51 (!) 56 (!) 55 (!) 56  Resp: (!) 24 (!) 21 (!) 25 13  Temp:      TempSrc:      SpO2: 98% 98% 97% 100%  Weight:  Height:        Intake/Output Summary (Last 24 hours) at 08/03/2021 1250 Last data filed at 08/02/2021 2330 Gross per 24 hour  Intake 100 ml  Output 450 ml  Net -350 ml   Filed Weights    08/02/21 2300  Weight: 74.4 kg    Exam:  General: 86 y.o. year-old female well developed well nourished in no acute distress.  Drowsy but easily arousable to voices.  Follows commands. Cardiovascular: Regular rate and rhythm with no rubs or gallops.  No thyromegaly or JVD noted.   Respiratory: Clear to auscultation with no wheezes or rales. Good inspiratory effort. Abdomen: Soft nontender nondistended with normal bowel sounds x4 quadrants. Musculoskeletal: Trace lower extremity edema bilaterally.   Skin: No ulcerative lesions noted or rashes, Psychiatry: Mood is appropriate for condition and setting   Data Reviewed: CBC: Recent Labs  Lab 08/02/21 1926 08/03/21 0511  WBC 5.4 4.8  NEUTROABS 4.0  --   HGB 14.3 12.5  HCT 45.7 38.4  MCV 90.0 88.5  PLT 169 151   Basic Metabolic Panel: Recent Labs  Lab 08/02/21 1926 08/03/21 0511  NA 135 135  K 3.8 3.6  CL 102 101  CO2 24 25  GLUCOSE 107* 91  BUN 8 8  CREATININE 0.77 0.73  CALCIUM 9.2 8.3*   GFR: Estimated Creatinine Clearance: 49.4 mL/min (by C-G formula based on SCr of 0.73 mg/dL). Liver Function Tests: Recent Labs  Lab 08/02/21 1926  AST 26  ALT 18  ALKPHOS 80  BILITOT 0.9  PROT 7.0  ALBUMIN 3.5   No results for input(s): LIPASE, AMYLASE in the last 168 hours. No results for input(s): AMMONIA in the last 168 hours. Coagulation Profile: No results for input(s): INR, PROTIME in the last 168 hours. Cardiac Enzymes: No results for input(s): CKTOTAL, CKMB, CKMBINDEX, TROPONINI in the last 168 hours. BNP (last 3 results) No results for input(s): PROBNP in the last 8760 hours. HbA1C: No results for input(s): HGBA1C in the last 72 hours. CBG: No results for input(s): GLUCAP in the last 168 hours. Lipid Profile: No results for input(s): CHOL, HDL, LDLCALC, TRIG, CHOLHDL, LDLDIRECT in the last 72 hours. Thyroid Function Tests: No results for input(s): TSH, T4TOTAL, FREET4, T3FREE, THYROIDAB in the last 72  hours. Anemia Panel: No results for input(s): VITAMINB12, FOLATE, FERRITIN, TIBC, IRON, RETICCTPCT in the last 72 hours. Urine analysis:    Component Value Date/Time   COLORURINE YELLOW 08/02/2021 1926   APPEARANCEUR CLEAR 08/02/2021 1926   LABSPEC 1.010 08/02/2021 1926   PHURINE 7.0 08/02/2021 1926   GLUCOSEU NEGATIVE 08/02/2021 1926   HGBUR SMALL (A) 08/02/2021 1926   BILIRUBINUR NEGATIVE 08/02/2021 1926   KETONESUR 5 (A) 08/02/2021 1926   PROTEINUR NEGATIVE 08/02/2021 1926   NITRITE NEGATIVE 08/02/2021 1926   LEUKOCYTESUR NEGATIVE 08/02/2021 1926   Sepsis Labs: @LABRCNTIP (procalcitonin:4,lacticidven:4)  ) Recent Results (from the past 240 hour(s))  Resp Panel by RT-PCR (Flu A&B, Covid) Nasopharyngeal Swab     Status: None   Collection Time: 08/02/21 11:10 PM   Specimen: Nasopharyngeal Swab; Nasopharyngeal(NP) swabs in vial transport medium  Result Value Ref Range Status   SARS Coronavirus 2 by RT PCR NEGATIVE NEGATIVE Final    Comment: (NOTE) SARS-CoV-2 target nucleic acids are NOT DETECTED.  The SARS-CoV-2 RNA is generally detectable in upper respiratory specimens during the acute phase of infection. The lowest concentration of SARS-CoV-2 viral copies this assay can detect is 138 copies/mL. A negative result does not preclude SARS-Cov-2  infection and should not be used as the sole basis for treatment or other patient management decisions. A negative result may occur with  improper specimen collection/handling, submission of specimen other than nasopharyngeal swab, presence of viral mutation(s) within the areas targeted by this assay, and inadequate number of viral copies(<138 copies/mL). A negative result must be combined with clinical observations, patient history, and epidemiological information. The expected result is Negative.  Fact Sheet for Patients:  BloggerCourse.com  Fact Sheet for Healthcare Providers:   SeriousBroker.it  This test is no t yet approved or cleared by the [place] FDA and  has been authorized for detection and/or diagnosis of SARS-CoV-2 by FDA under an Emergency Use Authorization (EUA). This EUA will remain  in effect (meaning this test can be used) for the duration of the COVID-19 declaration under Section 564(b)(1) of the Act, 21 U.S.C.section 360bbb-3(b)(1), unless the authorization is terminated  or revoked sooner.       Influenza A by PCR NEGATIVE NEGATIVE Final   Influenza B by PCR NEGATIVE NEGATIVE Final    Comment: (NOTE) The Xpert Xpress SARS-CoV-2/FLU/RSV plus assay is intended as an aid in the diagnosis of influenza from Nasopharyngeal swab specimens and should not be used as a sole basis for treatment. Nasal washings and aspirates are unacceptable for Xpert Xpress SARS-CoV-2/FLU/RSV testing.  Fact Sheet for Patients: BloggerCourse.com  Fact Sheet for Healthcare Providers: SeriousBroker.it  This test is not yet approved or cleared by the [place] FDA and has been authorized for detection and/or diagnosis of SARS-CoV-2 by FDA under an Emergency Use Authorization (EUA). This EUA will remain in effect (meaning this test can be used) for the duration of the COVID-19 declaration under Section 564(b)(1) of the Act, 21 U.S.C. section 360bbb-3(b)(1), unless the authorization is terminated or revoked.  Performed at [facility], [address] [place], [place] [address]       Studies: CT HEAD WO CONTRAST ( )  Result Date: 08/02/2021 CLINICAL DATA:  TIA symptoms EXAM: CT HEAD WITHOUT CONTRAST TECHNIQUE: Contiguous axial images were obtained from the base of the skull through the vertex without intravenous contrast. COMPARISON:  None. FINDINGS: Brain: Patient motion limits evaluation of the skull base. There are scattered hypodensities throughout the  periventricular and subcortical white matter most consistent with age-indeterminate small vessel ischemic change. Chronic infarct superior left cerebellar hemisphere. No evidence of acute hemorrhage. Lateral ventricles and remaining midline structures are unremarkable. No acute extra-axial fluid collections. No mass effect. Vascular: Diffuse atherosclerosis of the internal carotid arteries and basilar artery. No hyperdense vessel. Skull: Normal. Negative for fracture or focal lesion. Sinuses/Orbits: No acute finding. Other: None. IMPRESSION: 1. Patient motion limits evaluation. 2. Hypodensities throughout the periventricular and subcortical white matter, most consistent with chronic small vessel ischemic change. Chronic left cerebellar infarct. 3. Otherwise no acute infarct or hemorrhage. Electronically Signed   By: Sharlet Salina M.D.   On: 08/02/2021 20:46    Scheduled Meds:  enoxaparin (LOVENOX) injection  40 mg Subcutaneous Q24H    Continuous Infusions:  meropenem (MERREM) IV Stopped (08/03/21 0303)     LOS: 0 days     Darlin Drop, MD [facility] Pager [phone]  If 7PM-7AM, please contact night-coverage [email or web address] Password [email or web address] 08/03/2021, 12:50 PM

## 2021-08-03 NOTE — ED Notes (Signed)
MD Margo Aye made aware of pts elevated BP readings.

## 2021-08-03 NOTE — Evaluation (Signed)
Occupational Therapy Evaluation Patient Details Name: Cheryl Monroe MRN: [id number] DOB: Nov 17, 1934 Today's Date: 08/03/2021   History of Present Illness This 86 y.o. female admitted AMS.  CT of brain showed: Hypodensities throughout the periventricular and subcortical white matter, most consistent with chronic small vessel ischemic change. Chronic left cerebellar infarct.  No acute infarct or hemorrhage.  Dx: possible UTI - work up underway.  PMH includes:  Alzheimer's disease, balance deficits, OA, spinal stenosis, weakness of Lt LE   Clinical Impression   Pt admitted with above. She demonstrates the below listed deficits and will benefit from continued OT to maximize safety and independence with BADLs.  Pt presents to OT with generalized weakness, impaired balance, and impaired cognition.  She currently requires min A - mod A for ADLs and min A for functional mobility.  She resides at [facility].  Recommend return to SNF at discharge with follow up OT.  Will follow.        Recommendations for follow up therapy are one component of a multi-disciplinary discharge planning process, led by the attending physician.  Recommendations may be updated based on patient status, additional functional criteria and insurance authorization.   Follow Up Recommendations  SNF    Equipment Recommendations  None recommended by OT    Recommendations for Other Services       Precautions / Restrictions Precautions Precautions: Fall      Mobility Bed Mobility Overal bed mobility: Needs Assistance Bed Mobility: Supine to Sit     Supine to sit: Mod assist     General bed mobility comments: assist to initate movement and assist to list trunk    Transfers Overall transfer level: Needs assistance Equipment used: Rolling walker (2 wheeled) Transfers: Sit to/from Stand;Stand Pivot Transfers Sit to Stand: +2 safety/equipment;Min assist;+2 physical assistance Stand pivot transfers: Min assist;+2  safety/equipment       General transfer comment: +2 to stand from ED stretcher    Balance Overall balance assessment: Needs assistance Sitting-balance support: Feet unsupported Sitting balance-Leahy Scale: Fair     Standing balance support: Bilateral upper extremity supported Standing balance-Leahy Scale: Poor Standing balance comment: requires UE support                           ADL either performed or assessed with clinical judgement   ADL Overall ADL's : Needs assistance/impaired Eating/Feeding: Independent   Grooming: Wash/dry hands;Wash/dry face;Oral care;Brushing hair;Supervision/safety;Set up;Sitting   Upper Body Bathing: Set up;Supervision/ safety;Sitting   Lower Body Bathing: Minimal assistance;Sit to/from stand   Upper Body Dressing : Minimal assistance;Sitting   Lower Body Dressing: Moderate assistance;Sit to/from stand   Toilet Transfer: Minimal assistance;+2 for safety/equipment;Ambulation;Comfort height toilet;Grab bars;RW   Toileting- Clothing Manipulation and Hygiene: Moderate assistance;Sit to/from stand       Functional mobility during ADLs: Minimal assistance;+2 for safety/equipment;Rolling walker       Vision   Vision Assessment?: No apparent visual deficits     Perception     Praxis      Pertinent Vitals/Pain Pain Assessment: No/denies pain     Hand Dominance Right   Extremity/Trunk Assessment Upper Extremity Assessment Upper Extremity Assessment: Generalized weakness   Lower Extremity Assessment Lower Extremity Assessment: Defer to PT evaluation       Communication Communication Communication: Expressive difficulties   Cognition Arousal/Alertness: Awake/alert Behavior During Therapy: WFL for tasks assessed/performed Overall Cognitive Status: No family/caregiver present to determine baseline cognitive functioning  General Comments: Pt has h/o Alzheimer's Disease.  Pt  perseverated on need for bacon to be crispier.  Required frequent redirection back to task   General Comments       Exercises     Shoulder Instructions      Home Living Family/patient expects to be discharged to:: Skilled nursing facility                                 Additional Comments: Pt resides at [facility]      Prior Functioning/Environment          Comments: Per notes, was using RW for ambulation        OT Problem List: Decreased strength;Decreased activity tolerance;Impaired balance (sitting and/or standing);Decreased cognition;Decreased safety awareness;Decreased knowledge of use of DME or AE      OT Treatment/Interventions: Self-care/ADL training;Therapeutic activities;Cognitive remediation/compensation;Patient/family education;Balance training;DME and/or AE instruction    OT Goals(Current goals can be found in the care plan section) Acute Rehab OT Goals Patient Stated Goal: to microwave the bacon  to make it crispy OT Goal Formulation: With patient Time For Goal Achievement: 08/17/21 Potential to Achieve Goals: Good ADL Goals Pt Will Perform Grooming: with min guard assist;standing Pt Will Perform Lower Body Bathing: with min guard assist;sit to/from stand Pt Will Perform Upper Body Dressing: with set-up;sitting Pt Will Transfer to Toilet: with min guard assist;ambulating;regular height toilet;grab bars Pt Will Perform Toileting - Clothing Manipulation and hygiene: with min guard assist;sit to/from stand  OT Frequency: Min 2X/week   Barriers to D/C:            Co-evaluation PT/OT/SLP Co-Evaluation/Treatment: Yes Reason for Co-Treatment: For patient/therapist safety;To address functional/ADL transfers   OT goals addressed during session: ADL's and self-care      AM-PAC OT "6 Clicks" Daily Activity     Outcome Measure Help from another person eating meals?: None Help from another person taking care of personal grooming?: A  Little Help from another person toileting, which includes using toliet, bedpan, or urinal?: A Lot Help from another person bathing (including washing, rinsing, drying)?: A Lot Help from another person to put on and taking off regular upper body clothing?: A Little Help from another person to put on and taking off regular lower body clothing?: A Lot 6 Click Score: 16   End of Session Equipment Utilized During Treatment: Rolling walker;Gait belt Nurse Communication: Mobility status  Activity Tolerance: Patient tolerated treatment well Patient left: in bed;with call bell/phone within reach;with nursing/sitter in room;with bed alarm set  OT Visit Diagnosis: Unsteadiness on feet (R26.81);Cognitive communication deficit (R41.841)                Time: 9509-3267 OT Time Calculation (min): 27 min Charges:  OT General Charges $OT Visit: 1 Visit OT Evaluation $OT Eval Moderate Complexity: 1 Mod  Eber Jones., OTR/L Acute Rehabilitation Services Pager [phone] Office [phone]   Jeani Hawking M 08/03/2021, 1:06 PM

## 2021-08-04 ENCOUNTER — Encounter (HOSPITAL_COMMUNITY): Payer: Self-pay | Admitting: Internal Medicine

## 2021-08-04 DIAGNOSIS — G9341 Metabolic encephalopathy: Secondary | ICD-10-CM | POA: Diagnosis not present

## 2021-08-04 LAB — RESP PANEL BY RT-PCR (FLU A&B, COVID) ARPGX2
Influenza A by PCR: NEGATIVE
Influenza B by PCR: NEGATIVE
SARS Coronavirus 2 by RT PCR: NEGATIVE

## 2021-08-04 NOTE — TOC Initial Note (Signed)
Transition of Care [facility]) - Initial/Assessment Note    Patient Details  Name: Cheryl Monroe MRN: [id number] Date of Birth: 01/03/1935  Transition of Care [facility]) CM/SW Contact:    Ralene Bathe, LCSWA Phone Number: 08/04/2021, 10:48 AM  Clinical Narrative:                  CSW received consult for possible SNF placement at time of discharge. CSW spoke with patient's daughter as the patient was disoriented during visit. The patient's daughter states that the patient is from [facility] and the family would like for the patient to return and receive HHPT, HHOT, and speech therapy.    No further questions reported at this time.   [facility] can accept the patient tomorrow.  Skilled Nursing Rehab Facilities-   [employer]inProtection.co.uk Ratings out of 5 possible    Name Address  Phone # Quality Care Staffing Health Inspection Overall  [facility] [address], [place] [phone] 5 1 4 4   [facility]  [address], [place] [phone] 3 1 5 4   [facility] [address] [place], [address] [place] 3 1 1 1   [facility] [address] 2 2 4 4   [facility] [address] [phone] 3 1 2 1   [facility] [phone] [address], [phone] 3 2 4 4   [facility] [address], [address] [place] 5 1 2 2   [facility] [address] [facility] [place] 5 2 2 3   [facility] [address] [facility] 5 1 2 2   [facility] [phone], [phone] 5 1 2 2   [facility] [address], [facility] [phone] 5 1 2 2   [facility]. [address], [place] [phone] 3 1 1 1           Patient Goals and CMS Choice        Expected Discharge Plan and Services                                                Prior Living  Arrangements/Services                       Activities of Daily Living      Permission Sought/Granted                  Emotional Assessment              Admission diagnosis:  Episode of transient neurologic symptoms [R29.90] Acute metabolic encephalopathy [G93.41] Patient Active Problem List   Diagnosis Date Noted   Acute metabolic encephalopathy 08/02/2021   Word finding difficulty 02/22/2021   History of COVID-19 07/26/2020   Orthostatic dizziness 07/19/2020   Balance problem 07/19/2020   Constipation 07/19/2020   Clitoral irritation 07/19/2020   Asymptomatic bacteriuria 07/19/2020   Urinary frequency 07/19/2020   Urge incontinence 07/19/2020   Weakness of left lower extremity 07/19/2020   Essential hypertension 07/19/2020   Weakness 07/19/2020   Impaired mobility 07/19/2020   Recurrent UTI 07/19/2020   Dementia without behavioral disturbance (HCC) 07/19/2020   Edema 07/19/2020   Spinal stenosis 07/19/2020   PCP:  07/21/2020, MD Pharmacy:   [facility] [place], 07/21/2020 - [address]  [address] [place] [address] Phone: [phone] Fax: [phone]  [facility] # [address] [place] - [address] [place] [address] Phone: [phone] Fax: [phone]     Social Determinants of Health (SDOH) Interventions    Readmission Risk Interventions No flowsheet data found.

## 2021-08-04 NOTE — Plan of Care (Signed)
  Problem: Education: Goal: Knowledge of General Education information will improve Description: Including pain rating scale, medication(s)/side effects and non-pharmacologic comfort measures Outcome: Progressing   Problem: Health Behavior/Discharge Planning: Goal: Ability to manage health-related needs will improve Outcome: Progressing   Problem: Elimination: Goal: Will not experience complications related to bowel motility Outcome: Progressing   Problem: Skin Integrity: Goal: Risk for impaired skin integrity will decrease Outcome: Progressing

## 2021-08-04 NOTE — Progress Notes (Signed)
PROGRESS NOTE  Cheryl Monroe HDQ:[id number] DOB: 1935/04/01 DOA: 08/02/2021 PCP: Mahlon Gammon, MD  HPI/Recap of past 24 hours: Cheryl Monroe is a 86 y.o. 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  Work-up revealed presumptive UTI.  Urine analysis positive for many bacteria.  She was started on meropenem on admission.  Urine culture in process.  Last urine culture 05/18/2021 grew Pseudomonas (sensitive to meropenem, intermediate susceptibility to cefepime).    08/04/2021: Patient was seen and examined with her daughter at her bedside.  She is alert and interactive.  She is back to baseline mentation.  She has no new complaints.  Urine culture still in process.  Ongoing treatment for presumptive UTI, on meropenem.   Assessment/Plan: Principal Problem:   Acute metabolic encephalopathy Active Problems:   Recurrent UTI   Dementia without behavioral disturbance (HCC)  Resolved acute metabolic encephalopathy in the setting of advanced Alzheimer dementia. Patient presenting with increased speech difficulty, confusion, and gait difficulty compared to her baseline per son.  This is similar to her prior atypical UTI presentation.  Admission urinalysis shows many bacteria, negative nitrites and leukocytes. Continue empiric IV meropenem given most recent urine culture  Follow-up urine culture for ID and sensitivities. -Follow urine and blood cultures and narrow antibiotics as able Continue delirium precautions. Currently back to her baseline mentation.   Neurogenic bladder with incomplete emptying and history of recurrent UTI: Has been on chronic prophylaxis with nitrofurantoin which is currently on hold  while on IV antibiotics.  Monitor urine output, bladder scan/In-N-Out cath as needed.   History of CVA with residual mild expressive aphasia and gait imbalance: Chronic symptoms, more pronounced from baseline likely secondary to presumed UTI.  No focal deficits on admission.  Not currently on aspirin or statin. -Continue management as above PT OT have recommended SNF.   DVT prophylaxis: Lovenox subcu daily Code Status: DNR/DNI, confirmed with son at bedside Family Communication: Updated her daughter at bedside. Disposition Plan: From SNF and likely return to SNF, likely on 08/05/2021  Consults called: None  Level of care: Med-Surg  Admission status:  Status is: Inpatient  Objective: Vitals:   08/03/21 1600 08/03/21 1658 08/03/21 1800 08/04/21 0302  BP: (!) 171/68  (!) 181/78 (!) 128/45  Pulse: 62  65 66  Resp: (!) 21  16 19   Temp:  98.5 F (36.9 C) 98.5 F (36.9 C) 99 F (37.2 C)  TempSrc:  Oral Oral   SpO2: 97%  99% 95%  Weight:   69.8 kg   Height:   5\' 2"  (1.575 m)     Intake/Output Summary (Last 24 hours) at 08/04/2021 1029 Last data filed at 08/04/2021 0000 Gross per 24 hour  Intake 210.38 ml  Output 800 ml  Net -589.62 ml   Filed Weights   08/02/21 2300 08/03/21 1800  Weight: 74.4 kg 69.8 kg    Exam:  General: 86 y.o. year-old female well-developed well-nourished in no acute distress.  She is alert and interactive.   Cardiovascular: Regular rate and rhythm no rubs or gallops. Respiratory: Clear to auscultation no wheezes or rales.  Abdomen: Soft nontender normal bowel sounds present.  Musculoskeletal: Trace lower extremity edema bilaterally. Skin: No ulcerative lesions noted.   Psychiatry: Mood is appropriate for condition  and setting.   Data Reviewed: CBC: Recent Labs  Lab 08/02/21 1926 08/03/21 0511  WBC 5.4 4.8  NEUTROABS 4.0  --   HGB 14.3 12.5  HCT 45.7 38.4  MCV 90.0 88.5  PLT 169 151   Basic Metabolic Panel: Recent Labs  Lab  08/02/21 1926 08/03/21 0511  NA 135 135  K 3.8 3.6  CL 102 101  CO2 24 25  GLUCOSE 107* 91  BUN 8 8  CREATININE 0.77 0.73  CALCIUM 9.2 8.3*   GFR: Estimated Creatinine Clearance: 47.9 mL/min (by C-G formula based on SCr of 0.73 mg/dL). Liver Function Tests: Recent Labs  Lab 08/02/21 1926  AST 26  ALT 18  ALKPHOS 80  BILITOT 0.9  PROT 7.0  ALBUMIN 3.5   No results for input(s): LIPASE, AMYLASE in the last 168 hours. No results for input(s): AMMONIA in the last 168 hours. Coagulation Profile: No results for input(s): INR, PROTIME in the last 168 hours. Cardiac Enzymes: No results for input(s): CKTOTAL, CKMB, CKMBINDEX, TROPONINI in the last 168 hours. BNP (last 3 results) No results for input(s): PROBNP in the last 8760 hours. HbA1C: No results for input(s): HGBA1C in the last 72 hours. CBG: No results for input(s): GLUCAP in the last 168 hours. Lipid Profile: No results for input(s): CHOL, HDL, LDLCALC, TRIG, CHOLHDL, LDLDIRECT in the last 72 hours. Thyroid Function Tests: No results for input(s): TSH, T4TOTAL, FREET4, T3FREE, THYROIDAB in the last 72 hours. Anemia Panel: No results for input(s): VITAMINB12, FOLATE, FERRITIN, TIBC, IRON, RETICCTPCT in the last 72 hours. Urine analysis:    Component Value Date/Time   COLORURINE YELLOW 08/02/2021 1926   APPEARANCEUR CLEAR 08/02/2021 1926   LABSPEC 1.010 08/02/2021 1926   PHURINE 7.0 08/02/2021 1926   GLUCOSEU NEGATIVE 08/02/2021 1926   HGBUR SMALL (A) 08/02/2021 1926   BILIRUBINUR NEGATIVE 08/02/2021 1926   KETONESUR 5 (A) 08/02/2021 1926   PROTEINUR NEGATIVE 08/02/2021 1926   NITRITE NEGATIVE 08/02/2021 1926   LEUKOCYTESUR NEGATIVE 08/02/2021 1926   Sepsis Labs: @LABRCNTIP (procalcitonin:4,lacticidven:4)  ) Recent Results (from the past 240 hour(s))  Blood culture (routine x 2)     Status: None (Preliminary result)   Collection Time: 08/02/21  7:14 PM   Specimen: BLOOD  Result Value Ref Range Status    Specimen Description BLOOD RIGHT ANTECUBITAL  Final   Special Requests   Final    BOTTLES DRAWN AEROBIC AND ANAEROBIC Blood Culture adequate volume   Culture   Final    NO GROWTH 1 DAY Performed at [facility], [address], [place], [place]    Report Status PENDING  Incomplete  Urine Culture     Status: None (Preliminary result)   Collection Time: 08/02/21 10:15 PM   Specimen: Urine, Clean Catch  Result Value Ref Range Status   Specimen Description URINE, CLEAN CATCH  Final   Special Requests NONE  Final   Culture   Final    CULTURE REINCUBATED FOR BETTER GROWTH Performed at [facility], [address] [place], [place]    Report Status PENDING  Incomplete  Resp Panel by RT-PCR (Flu A&B, Covid) Nasopharyngeal Swab     Status: None   Collection Time: 08/02/21 11:10 PM   Specimen: Nasopharyngeal Swab; Nasopharyngeal(NP) swabs in vial transport medium  Result Value Ref Range Status   SARS Coronavirus 2 by RT PCR NEGATIVE NEGATIVE Final    Comment: (NOTE) SARS-CoV-2 target nucleic acids are NOT DETECTED.  The  SARS-CoV-2 RNA is generally detectable in upper respiratory specimens during the acute phase of infection. The lowest concentration of SARS-CoV-2 viral copies this assay can detect is 138 copies/mL. A negative result does not preclude SARS-Cov-2 infection and should not be used as the sole basis for treatment or other patient management decisions. A negative result may occur with  improper specimen collection/handling, submission of specimen other than nasopharyngeal swab, presence of viral mutation(s) within the areas targeted by this assay, and inadequate number of viral copies(<138 copies/mL). A negative result must be combined with clinical observations, patient history, and epidemiological information. The expected result is Negative.  Fact Sheet for Patients:  BloggerCourse.com  Fact Sheet for Healthcare  Providers:  SeriousBroker.it  This test is no t yet approved or cleared by the [place] FDA and  has been authorized for detection and/or diagnosis of SARS-CoV-2 by FDA under an Emergency Use Authorization (EUA). This EUA will remain  in effect (meaning this test can be used) for the duration of the COVID-19 declaration under Section 564(b)(1) of the Act, 21 U.S.C.section 360bbb-3(b)(1), unless the authorization is terminated  or revoked sooner.       Influenza A by PCR NEGATIVE NEGATIVE Final   Influenza B by PCR NEGATIVE NEGATIVE Final    Comment: (NOTE) The Xpert Xpress SARS-CoV-2/FLU/RSV plus assay is intended as an aid in the diagnosis of influenza from Nasopharyngeal swab specimens and should not be used as a sole basis for treatment. Nasal washings and aspirates are unacceptable for Xpert Xpress SARS-CoV-2/FLU/RSV testing.  Fact Sheet for Patients: BloggerCourse.com  Fact Sheet for Healthcare Providers: SeriousBroker.it  This test is not yet approved or cleared by the [place] FDA and has been authorized for detection and/or diagnosis of SARS-CoV-2 by FDA under an Emergency Use Authorization (EUA). This EUA will remain in effect (meaning this test can be used) for the duration of the COVID-19 declaration under Section 564(b)(1) of the Act, 21 U.S.C. section 360bbb-3(b)(1), unless the authorization is terminated or revoked.  Performed at [facility], [address], [place], [place] [address]   Blood culture (routine x 2)     Status: None (Preliminary result)   Collection Time: 08/02/21 11:26 PM   Specimen: BLOOD  Result Value Ref Range Status   Specimen Description BLOOD BLOOD RIGHT WRIST  Final   Special Requests   Final    BOTTLES DRAWN AEROBIC AND ANAEROBIC Blood Culture adequate volume   Culture   Final    NO GROWTH 1 DAY Performed at [facility], [address] [place], [place] [address]    Report Status PENDING  Incomplete      Studies: No results found.  Scheduled Meds:  enoxaparin (LOVENOX) injection  40 mg Subcutaneous Q24H   tamsulosin  0.4 mg Oral QHS    Continuous Infusions:  meropenem (MERREM) IV 1 g (08/03/21 2243)     LOS: 1 day     Darlin Drop, MD [facility] Pager [phone]  If 7PM-7AM, please contact night-coverage [email or web address] Password [email or web address] 08/04/2021, 10:29 AM

## 2021-08-04 NOTE — NC FL2 (Signed)
[place] MEDICAID FL2 LEVEL OF CARE SCREENING TOOL     IDENTIFICATION  Patient Name: Cheryl Monroe Birthdate: 05-01-1935 Sex: female Admission Date (Current Location): 08/02/2021  [facility] and [place] Number:  Producer, television/film/video and Address:  [facility], [address], [place], [place] [address]      Provider Number: [id number]  Attending Physician Name and Address:  Darlin Drop, DO  Relative Name and Phone Number:  Emmah, Bratcher [phone])   [phone]    Current Level of Care: Hospital Recommended Level of Care: Skilled Nursing Facility Prior Approval Number:    Date Approved/Denied:   PASRR Number:    Discharge Plan: SNF    Current Diagnoses: Patient Active Problem List   Diagnosis Date Noted   Acute metabolic encephalopathy 08/02/2021   Word finding difficulty 02/22/2021   History of COVID-19 07/26/2020   Orthostatic dizziness 07/19/2020   Balance problem 07/19/2020   Constipation 07/19/2020   Clitoral irritation 07/19/2020   Asymptomatic bacteriuria 07/19/2020   Urinary frequency 07/19/2020   Urge incontinence 07/19/2020   Weakness of left lower extremity 07/19/2020   Essential hypertension 07/19/2020   Weakness 07/19/2020   Impaired mobility 07/19/2020   Recurrent UTI 07/19/2020   Dementia without behavioral disturbance (HCC) 07/19/2020   Edema 07/19/2020   Spinal stenosis 07/19/2020    Orientation RESPIRATION BLADDER Height & Weight     Self  Normal Incontinent Weight: 153 lb 14.1 oz (69.8 kg) Height:  5\' 2"  (157.5 cm)  BEHAVIORAL SYMPTOMS/MOOD NEUROLOGICAL BOWEL NUTRITION STATUS      Continent Diet (see d/c summary)  AMBULATORY STATUS COMMUNICATION OF NEEDS Skin   Extensive Assist Verbally Normal                       Personal Care Assistance Level of Assistance  Feeding, Dressing, Bathing Bathing Assistance: Maximum assistance Feeding assistance: Limited assistance Dressing Assistance: Maximum assistance      Functional Limitations Info  Sight, Speech, Hearing Sight Info: Adequate Hearing Info: Adequate Speech Info: Adequate    SPECIAL CARE FACTORS FREQUENCY  PT (By licensed PT), OT (By licensed OT)     PT Frequency: 5x/ week OT Frequency: 5x/ week            Contractures Contractures Info: Not present    Additional Factors Info  Code Status, Allergies Code Status Info: DNR Allergies Info: Sulfa Antibiotics           Current Medications (08/04/2021):  This is the current hospital active medication list Current Facility-Administered Medications  Medication Dose Route Frequency Provider Last Rate Last Admin   acetaminophen (TYLENOL) tablet 650 mg  650 mg Oral Q6H PRN 08/06/2021, MD   650 mg at 08/03/21 0106   Or   acetaminophen (TYLENOL) suppository 650 mg  650 mg Rectal Q6H PRN 08/05/21, MD       enoxaparin (LOVENOX) injection 40 mg  40 mg Subcutaneous Q24H Charlsie Quest R, MD   40 mg at 08/04/21 1039   meropenem (MERREM) 1 g in sodium chloride 0.9 % 100 mL IVPB  1 g Intravenous Q12H 08/06/21, RPH 200 mL/hr at 08/04/21 1040 1 g at 08/04/21 1040   ondansetron (ZOFRAN) tablet 4 mg  4 mg Oral Q6H PRN 08/06/21, MD       Or   ondansetron (ZOFRAN) injection 4 mg  4 mg Intravenous Q6H PRN Charlsie Quest, MD  senna-docusate (Senokot-S) tablet 1 tablet  1 tablet Oral QHS PRN Charlsie Quest, MD       tamsulosin [facility]) capsule 0.4 mg  0.4 mg Oral QHS Hall, Carole N, DO   0.4 mg at 08/03/21 2221     Discharge Medications: Please see discharge summary for a list of discharge medications.  Relevant Imaging Results:  Relevant Lab Results:   Additional Information SSN: [id number];Moderna COVID-19 Vaccine 12/02/2019 , 11/04/2019  Moderna Covid-19 Booster Vaccine 06/12/2021 , 08/31/2020;  5'2" 153lbs  [employer] Lawsen Arnott, LCSWA

## 2021-08-05 DIAGNOSIS — G9341 Metabolic encephalopathy: Secondary | ICD-10-CM | POA: Diagnosis not present

## 2021-08-05 LAB — URINE CULTURE: Culture: >=100000 — AB

## 2021-08-05 MED ORDER — ACETAMINOPHEN 500 MG PO TABS: 1000.0000 mg | ORAL_TABLET | Freq: Three times a day (TID) | ORAL | Status: DC | PRN

## 2021-08-05 MED ORDER — NITROFURANTOIN MACROCRYSTAL 50 MG PO CAPS: 50.0000 mg | ORAL_CAPSULE | Freq: Two times a day (BID) | ORAL | Status: DC

## 2021-08-05 MED ORDER — DOCUSATE SODIUM 100 MG PO CAPS: 100.0000 mg | ORAL_CAPSULE | Freq: Two times a day (BID) | ORAL | Status: DC | PRN

## 2021-08-05 NOTE — Discharge Summary (Signed)
Physician Discharge Summary  Cheryl Monroe ZJI:[id number] DOB: June 18, 1935 DOA: 08/02/2021  PCP: Mahlon Gammon, MD  Admit date: 08/02/2021 Discharge date: 08/05/2021  [facility]:  [facility]  Recommendations for Outpatient Follow-up:  PCP in 1 wk  Home Health:no  Equipment/Devices: none  Discharge Condition: Stable Code Status:   Code Status: DNR Diet recommendation:  Diet Order             Diet - low sodium heart healthy           Diet regular Room service appropriate? Yes; Fluid consistency: Thin  Diet effective now                    Brief/Interim Summary: Per HPI- 86 y.o. 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  Work-up revealed presumptive UTI.  Urine analysis positive for many bacteria.  She was started on meropenem on admission.  Urine culture in process.  Last urine culture 05/18/2021 grew Pseudomonas (sensitive to meropenem, intermediate susceptibility to cefepime. Patient has continued on IV antibiotics pending urine culture urine culture came back with lactobacillus, suspect contamination.  At this time patient mental status back to baseline although she remains deconditioned and weak advised PT OT and rehab back to facility.  Discussed with the daughter.  At the bedside.  She will be discharged back to the facility today daughter requesting home health PT OT and speech for cognition Discontinued meropenem and resume nitrofurantoin upon discharge.  Discharge Diagnoses:   Acute metabolic encephalopathy: Suspect UTI on admission but urine culture with lactobacillus, UTI ruled out.  Likely deconditioning she had recent Morgana vaccination  given her dementia suspect encephalopathy multifactorial but back to baseline and stable.  History of recurrent UTI neurogenic bladder with incomplete emptying: Continue her Macrobid, flomax. Alzheimer's dementia: PT OT supportive care delirium precaution History of CVA with residual mild expressive aphasia and gait imbalance resume PT OT, speech.  Consults: none  Subjective: Alert awake able to tell me her name her date of birth.  Discharge Exam: Vitals:   08/05/21 0533 08/05/21 0818  BP: (!) 141/66 (!) 126/56  Pulse: (!) 58 65  Resp: 18 17  Temp: 98.3 F (36.8 C) 98.1 F (36.7 C)  SpO2: 100% 99%   General: Pt is alert, awake, not in acute distress Cardiovascular: RRR, S1/S2 +, no rubs, no gallops Respiratory: CTA bilaterally, no wheezing, no rhonchi Abdominal: Soft, NT, ND, bowel sounds + Extremities: no edema, no cyanosis  Discharge Instructions  Discharge Instructions     Diet - low sodium heart healthy   Complete by: As directed    Increase activity slowly   Complete by: As directed       Allergies as of 08/05/2021       Reactions   Sulfa Antibiotics Rash   Rash to trunk, legs, neck, scalp, and arms        Medication List     TAKE these medications    acetaminophen 500 MG tablet Commonly known as: TYLENOL Take 1,000 mg by mouth 3 (three) times daily as needed for moderate pain.   D-Mannose Powd Take 4 capsules by mouth daily. 99%   docusate sodium 100 MG capsule Commonly known as: COLACE Take 100  mg by mouth 2 (two) times daily as needed for mild constipation.   estradiol 2 MG vaginal ring Commonly known as: ESTRING Place 2 mg vaginally every 3 (three) months. follow package directions What changed: additional instructions   ibuprofen 200 MG tablet Commonly known as: ADVIL Take 400 mg by mouth 3 (three) times daily as needed for mild pain.   nitrofurantoin 50 MG capsule Commonly known as: MACRODANTIN Take 50 mg by mouth 2 (two) times  daily.   senna 8.6 MG Tabs tablet Commonly known as: SENOKOT Take 1 tablet by mouth in the morning and at bedtime.   tamsulosin 0.4 MG Caps capsule Commonly known as: FLOMAX Take 0.4 mg by mouth at bedtime.   THERACRAN PO Take 250 mg by mouth in the morning and at bedtime.        Follow-up Information     Schedule an appointment as soon as possible for a visit  with Mahlon Gammon, MD.   Specialty: Internal Medicine Contact information: [address] [place] [address] [phone]                Allergies  Allergen Reactions   Sulfa Antibiotics Rash    Rash to trunk, legs, neck, scalp, and arms    The results of significant diagnostics from this hospitalization (including imaging, microbiology, ancillary and laboratory) are listed below for reference.    Microbiology: Recent Results (from the past 240 hour(s))  Blood culture (routine x 2)     Status: None (Preliminary result)   Collection Time: 08/02/21  7:14 PM   Specimen: BLOOD  Result Value Ref Range Status   Specimen Description BLOOD RIGHT ANTECUBITAL  Final   Special Requests   Final    BOTTLES DRAWN AEROBIC AND ANAEROBIC Blood Culture adequate volume   Culture   Final    NO GROWTH 2 DAYS Performed at [facility], [address] [place], [place] [address]    Report Status PENDING  Incomplete  Urine Culture     Status: Abnormal   Collection Time: 08/02/21 10:15 PM   Specimen: Urine, Clean Catch  Result Value Ref Range Status   Specimen Description URINE, CLEAN CATCH  Final   Special Requests NONE  Final   Culture (A)  Final    >=100,000 COLONIES/mL LACTOBACILLUS SPECIES Standardized susceptibility testing for this organism is not available. Performed at [facility], [address], [place], [place] [address]    Report Status 08/05/2021 FINAL  Final  Resp Panel by RT-PCR (Flu A&B, Covid) Nasopharyngeal Swab     Status: None   Collection Time: 08/02/21 11:10 PM    Specimen: Nasopharyngeal Swab; Nasopharyngeal(NP) swabs in vial transport medium  Result Value Ref Range Status   SARS Coronavirus 2 by RT PCR NEGATIVE NEGATIVE Final    Comment: (NOTE) SARS-CoV-2 target nucleic acids are NOT DETECTED.  The SARS-CoV-2 RNA is generally detectable in upper respiratory specimens during the acute phase of infection. The lowest concentration of SARS-CoV-2 viral copies this assay can detect is 138 copies/mL. A negative result does not preclude SARS-Cov-2 infection and should not be used as the sole basis for treatment or other patient management decisions. A negative result may occur with  improper specimen collection/handling, submission of specimen other than nasopharyngeal swab, presence of viral mutation(s) within the areas targeted by this assay, and inadequate number of viral copies(<138 copies/mL). A negative result must be combined with clinical observations, patient history, and epidemiological information. The  expected result is Negative.  Fact Sheet for Patients:  BloggerCourse.com  Fact Sheet for Healthcare Providers:  SeriousBroker.it  This test is no t yet approved or cleared by the [place] FDA and  has been authorized for detection and/or diagnosis of SARS-CoV-2 by FDA under an Emergency Use Authorization (EUA). This EUA will remain  in effect (meaning this test can be used) for the duration of the COVID-19 declaration under Section 564(b)(1) of the Act, 21 U.S.C.section 360bbb-3(b)(1), unless the authorization is terminated  or revoked sooner.       Influenza A by PCR NEGATIVE NEGATIVE Final   Influenza B by PCR NEGATIVE NEGATIVE Final    Comment: (NOTE) The Xpert Xpress SARS-CoV-2/FLU/RSV plus assay is intended as an aid in the diagnosis of influenza from Nasopharyngeal swab specimens and should not be used as a sole basis for treatment. Nasal washings and aspirates are  unacceptable for Xpert Xpress SARS-CoV-2/FLU/RSV testing.  Fact Sheet for Patients: BloggerCourse.com  Fact Sheet for Healthcare Providers: SeriousBroker.it  This test is not yet approved or cleared by the [place] FDA and has been authorized for detection and/or diagnosis of SARS-CoV-2 by FDA under an Emergency Use Authorization (EUA). This EUA will remain in effect (meaning this test can be used) for the duration of the COVID-19 declaration under Section 564(b)(1) of the Act, 21 U.S.C. section 360bbb-3(b)(1), unless the authorization is terminated or revoked.  Performed at [facility], [address], [place], [place] [address]   Blood culture (routine x 2)     Status: None (Preliminary result)   Collection Time: 08/02/21 11:26 PM   Specimen: BLOOD  Result Value Ref Range Status   Specimen Description BLOOD BLOOD RIGHT WRIST  Final   Special Requests   Final    BOTTLES DRAWN AEROBIC AND ANAEROBIC Blood Culture adequate volume   Culture   Final    NO GROWTH 2 DAYS Performed at [facility], [address] [place], [place] [address]    Report Status PENDING  Incomplete  Resp Panel by RT-PCR (Flu A&B, Covid) Nasopharyngeal Swab     Status: None   Collection Time: 08/04/21  4:26 PM   Specimen: Nasopharyngeal Swab; Nasopharyngeal(NP) swabs in vial transport medium  Result Value Ref Range Status   SARS Coronavirus 2 by RT PCR NEGATIVE NEGATIVE Final    Comment: (NOTE) SARS-CoV-2 target nucleic acids are NOT DETECTED.  The SARS-CoV-2 RNA is generally detectable in upper respiratory specimens during the acute phase of infection. The lowest concentration of SARS-CoV-2 viral copies this assay can detect is 138 copies/mL. A negative result does not preclude SARS-Cov-2 infection and should not be used as the sole basis for treatment or other patient management decisions. A negative result may occur with  improper  specimen collection/handling, submission of specimen other than nasopharyngeal swab, presence of viral mutation(s) within the areas targeted by this assay, and inadequate number of viral copies(<138 copies/mL). A negative result must be combined with clinical observations, patient history, and epidemiological information. The expected result is Negative.  Fact Sheet for Patients:  BloggerCourse.com  Fact Sheet for Healthcare Providers:  SeriousBroker.it  This test is no t yet approved or cleared by the [place] FDA and  has been authorized for detection and/or diagnosis of SARS-CoV-2 by FDA under an Emergency Use Authorization (EUA). This EUA will remain  in effect (meaning this test can be used) for the duration of the COVID-19 declaration under Section 564(b)(1) of the Act,  21 U.S.C.section 360bbb-3(b)(1), unless the authorization is terminated  or revoked sooner.       Influenza A by PCR NEGATIVE NEGATIVE Final   Influenza B by PCR NEGATIVE NEGATIVE Final    Comment: (NOTE) The Xpert Xpress SARS-CoV-2/FLU/RSV plus assay is intended as an aid in the diagnosis of influenza from Nasopharyngeal swab specimens and should not be used as a sole basis for treatment. Nasal washings and aspirates are unacceptable for Xpert Xpress SARS-CoV-2/FLU/RSV testing.  Fact Sheet for Patients: BloggerCourse.com  Fact Sheet for Healthcare Providers: SeriousBroker.it  This test is not yet approved or cleared by the [place] FDA and has been authorized for detection and/or diagnosis of SARS-CoV-2 by FDA under an Emergency Use Authorization (EUA). This EUA will remain in effect (meaning this test can be used) for the duration of the COVID-19 declaration under Section 564(b)(1) of the Act, 21 U.S.C. section 360bbb-3(b)(1), unless the authorization is terminated or revoked.  Performed at  [facility], [address], [place], [place] [address]     Procedures/Studies: CT HEAD WO CONTRAST ( )  Result Date: 08/02/2021 CLINICAL DATA:  TIA symptoms EXAM: CT HEAD WITHOUT CONTRAST TECHNIQUE: Contiguous axial images were obtained from the base of the skull through the vertex without intravenous contrast. COMPARISON:  None. FINDINGS: Brain: Patient motion limits evaluation of the skull base. There are scattered hypodensities throughout the periventricular and subcortical white matter most consistent with age-indeterminate small vessel ischemic change. Chronic infarct superior left cerebellar hemisphere. No evidence of acute hemorrhage. Lateral ventricles and remaining midline structures are unremarkable. No acute extra-axial fluid collections. No mass effect. Vascular: Diffuse atherosclerosis of the internal carotid arteries and basilar artery. No hyperdense vessel. Skull: Normal. Negative for fracture or focal lesion. Sinuses/Orbits: No acute finding. Other: None. IMPRESSION: 1. Patient motion limits evaluation. 2. Hypodensities throughout the periventricular and subcortical white matter, most consistent with chronic small vessel ischemic change. Chronic left cerebellar infarct. 3. Otherwise no acute infarct or hemorrhage. Electronically Signed   By: Sharlet Salina M.D.   On: 08/02/2021 20:46    Labs: BNP (last 3 results) No results for input(s): BNP in the last 8760 hours. Basic Metabolic Panel: Recent Labs  Lab 08/02/21 1926 08/03/21 0511  NA 135 135  K 3.8 3.6  CL 102 101  CO2 24 25  GLUCOSE 107* 91  BUN 8 8  CREATININE 0.77 0.73  CALCIUM 9.2 8.3*   Liver Function Tests: Recent Labs  Lab 08/02/21 1926  AST 26  ALT 18  ALKPHOS 80  BILITOT 0.9  PROT 7.0  ALBUMIN 3.5   No results for input(s): LIPASE, AMYLASE in the last 168 hours. No results for input(s): AMMONIA in the last 168 hours. CBC: Recent Labs  Lab 08/02/21 1926 08/03/21 0511  WBC 5.4 4.8   NEUTROABS 4.0  --   HGB 14.3 12.5  HCT 45.7 38.4  MCV 90.0 88.5  PLT 169 151   Cardiac Enzymes: No results for input(s): CKTOTAL, CKMB, CKMBINDEX, TROPONINI in the last 168 hours. BNP: Invalid input(s): POCBNP CBG: No results for input(s): GLUCAP in the last 168 hours. D-Dimer No results for input(s): DDIMER in the last 72 hours. Hgb A1c No results for input(s): HGBA1C in the last 72 hours. Lipid Profile No results for input(s): CHOL, HDL, LDLCALC, TRIG, CHOLHDL, LDLDIRECT in the last 72 hours. Thyroid function studies No results for input(s): TSH, T4TOTAL, T3FREE, THYROIDAB in the last 72 hours.  Invalid input(s): FREET3 Anemia work up No results  for input(s): VITAMINB12, FOLATE, FERRITIN, TIBC, IRON, RETICCTPCT in the last 72 hours. Urinalysis    Component Value Date/Time   COLORURINE YELLOW 08/02/2021 1926   APPEARANCEUR CLEAR 08/02/2021 1926   LABSPEC 1.010 08/02/2021 1926   PHURINE 7.0 08/02/2021 1926   GLUCOSEU NEGATIVE 08/02/2021 1926   HGBUR SMALL (A) 08/02/2021 1926   BILIRUBINUR NEGATIVE 08/02/2021 1926   KETONESUR 5 (A) 08/02/2021 1926   PROTEINUR NEGATIVE 08/02/2021 1926   NITRITE NEGATIVE 08/02/2021 1926   LEUKOCYTESUR NEGATIVE 08/02/2021 1926   Sepsis Labs Invalid input(s): PROCALCITONIN,  WBC,  LACTICIDVEN Microbiology Recent Results (from the past 240 hour(s))  Blood culture (routine x 2)     Status: None (Preliminary result)   Collection Time: 08/02/21  7:14 PM   Specimen: BLOOD  Result Value Ref Range Status   Specimen Description BLOOD RIGHT ANTECUBITAL  Final   Special Requests   Final    BOTTLES DRAWN AEROBIC AND ANAEROBIC Blood Culture adequate volume   Culture   Final    NO GROWTH 2 DAYS Performed at [facility], [address] [place], [place] [address]    Report Status PENDING  Incomplete  Urine Culture     Status: Abnormal   Collection Time: 08/02/21 10:15 PM   Specimen: Urine, Clean Catch  Result Value Ref Range Status    Specimen Description URINE, CLEAN CATCH  Final   Special Requests NONE  Final   Culture (A)  Final    >=100,000 COLONIES/mL LACTOBACILLUS SPECIES Standardized susceptibility testing for this organism is not available. Performed at [facility], [address] [place], [place] [address]    Report Status 08/05/2021 FINAL  Final  Resp Panel by RT-PCR (Flu A&B, Covid) Nasopharyngeal Swab     Status: None   Collection Time: 08/02/21 11:10 PM   Specimen: Nasopharyngeal Swab; Nasopharyngeal(NP) swabs in vial transport medium  Result Value Ref Range Status   SARS Coronavirus 2 by RT PCR NEGATIVE NEGATIVE Final    Comment: (NOTE) SARS-CoV-2 target nucleic acids are NOT DETECTED.  The SARS-CoV-2 RNA is generally detectable in upper respiratory specimens during the acute phase of infection. The lowest concentration of SARS-CoV-2 viral copies this assay can detect is 138 copies/mL. A negative result does not preclude SARS-Cov-2 infection and should not be used as the sole basis for treatment or other patient management decisions. A negative result may occur with  improper specimen collection/handling, submission of specimen other than nasopharyngeal swab, presence of viral mutation(s) within the areas targeted by this assay, and inadequate number of viral copies(<138 copies/mL). A negative result must be combined with clinical observations, patient history, and epidemiological information. The expected result is Negative.  Fact Sheet for Patients:  BloggerCourse.com  Fact Sheet for Healthcare Providers:  SeriousBroker.it  This test is no t yet approved or cleared by the [place] FDA and  has been authorized for detection and/or diagnosis of SARS-CoV-2 by FDA under an Emergency Use Authorization (EUA). This EUA will remain  in effect (meaning this test can be used) for the duration of the COVID-19 declaration under Section  564(b)(1) of the Act, 21 U.S.C.section 360bbb-3(b)(1), unless the authorization is terminated  or revoked sooner.       Influenza A by PCR NEGATIVE NEGATIVE Final   Influenza B by PCR NEGATIVE NEGATIVE Final    Comment: (NOTE) The Xpert Xpress SARS-CoV-2/FLU/RSV plus assay is intended as an aid in the diagnosis of influenza from Nasopharyngeal swab specimens and should not  be used as a sole basis for treatment. Nasal washings and aspirates are unacceptable for Xpert Xpress SARS-CoV-2/FLU/RSV testing.  Fact Sheet for Patients: BloggerCourse.com  Fact Sheet for Healthcare Providers: SeriousBroker.it  This test is not yet approved or cleared by the [place] FDA and has been authorized for detection and/or diagnosis of SARS-CoV-2 by FDA under an Emergency Use Authorization (EUA). This EUA will remain in effect (meaning this test can be used) for the duration of the COVID-19 declaration under Section 564(b)(1) of the Act, 21 U.S.C. section 360bbb-3(b)(1), unless the authorization is terminated or revoked.  Performed at [facility], [address] [place], [place] [address]   Blood culture (routine x 2)     Status: None (Preliminary result)   Collection Time: 08/02/21 11:26 PM   Specimen: BLOOD  Result Value Ref Range Status   Specimen Description BLOOD BLOOD RIGHT WRIST  Final   Special Requests   Final    BOTTLES DRAWN AEROBIC AND ANAEROBIC Blood Culture adequate volume   Culture   Final    NO GROWTH 2 DAYS Performed at [facility], [address] [place], [place] [address]    Report Status PENDING  Incomplete  Resp Panel by RT-PCR (Flu A&B, Covid) Nasopharyngeal Swab     Status: None   Collection Time: 08/04/21  4:26 PM   Specimen: Nasopharyngeal Swab; Nasopharyngeal(NP) swabs in vial transport medium  Result Value Ref Range Status   SARS Coronavirus 2 by RT PCR NEGATIVE NEGATIVE Final    Comment:  (NOTE) SARS-CoV-2 target nucleic acids are NOT DETECTED.  The SARS-CoV-2 RNA is generally detectable in upper respiratory specimens during the acute phase of infection. The lowest concentration of SARS-CoV-2 viral copies this assay can detect is 138 copies/mL. A negative result does not preclude SARS-Cov-2 infection and should not be used as the sole basis for treatment or other patient management decisions. A negative result may occur with  improper specimen collection/handling, submission of specimen other than nasopharyngeal swab, presence of viral mutation(s) within the areas targeted by this assay, and inadequate number of viral copies(<138 copies/mL). A negative result must be combined with clinical observations, patient history, and epidemiological information. The expected result is Negative.  Fact Sheet for Patients:  BloggerCourse.com  Fact Sheet for Healthcare Providers:  SeriousBroker.it  This test is no t yet approved or cleared by the [place] FDA and  has been authorized for detection and/or diagnosis of SARS-CoV-2 by FDA under an Emergency Use Authorization (EUA). This EUA will remain  in effect (meaning this test can be used) for the duration of the COVID-19 declaration under Section 564(b)(1) of the Act, 21 U.S.C.section 360bbb-3(b)(1), unless the authorization is terminated  or revoked sooner.       Influenza A by PCR NEGATIVE NEGATIVE Final   Influenza B by PCR NEGATIVE NEGATIVE Final    Comment: (NOTE) The Xpert Xpress SARS-CoV-2/FLU/RSV plus assay is intended as an aid in the diagnosis of influenza from Nasopharyngeal swab specimens and should not be used as a sole basis for treatment. Nasal washings and aspirates are unacceptable for Xpert Xpress SARS-CoV-2/FLU/RSV testing.  Fact Sheet for Patients: BloggerCourse.com  Fact Sheet for Healthcare  Providers: SeriousBroker.it  This test is not yet approved or cleared by the [place] FDA and has been authorized for detection and/or diagnosis of SARS-CoV-2 by FDA under an Emergency Use Authorization (EUA). This EUA will remain in effect (meaning this test can be used) for the duration  of the COVID-19 declaration under Section 564(b)(1) of the Act, 21 U.S.C. section 360bbb-3(b)(1), unless the authorization is terminated or revoked.  Performed at [facility], [address], [place], [place] [address]      Time coordinating discharge: 25 minutes  SIGNED: Lanae Boast, MD  [facility] 08/05/2021, 10:42 AM  If 7PM-7AM, please contact night-coverage [email or web address]

## 2021-08-05 NOTE — TOC Transition Note (Signed)
Transition of Care [facility]) - CM/SW Discharge Note   Patient Details  Name: Cheryl Monroe MRN: [id number] Date of Birth: 1935-06-16  [facility] CM/SW Contact:  Carley Hammed, LCSWA Phone Number: 08/05/2021, 11:29 AM   Clinical Narrative:    Pt to be transported to Acute rehab at [facility] via [facility]. Nurse to call report to [phone].    Final next level of care: Skilled Nursing Facility Barriers to Discharge: Barriers Resolved   Patient Goals and CMS Choice        Discharge Placement              Patient chooses bed at: [facility] Patient to be transferred to facility by: [facility] Name of family member notified: Prachi Patient and family notified of of transfer: 08/05/21  Discharge Plan and Services                                     Social Determinants of Health (SDOH) Interventions     Readmission Risk Interventions No flowsheet data found.

## 2021-08-06 ENCOUNTER — Encounter: Payer: Self-pay | Admitting: Internal Medicine

## 2021-08-06 ENCOUNTER — Non-Acute Institutional Stay (SKILLED_NURSING_FACILITY): Payer: Medicare Other | Admitting: Internal Medicine

## 2021-08-06 DIAGNOSIS — N390 Urinary tract infection, site not specified: Secondary | ICD-10-CM

## 2021-08-06 DIAGNOSIS — F0280 Dementia in other diseases classified elsewhere without behavioral disturbance: Secondary | ICD-10-CM

## 2021-08-06 DIAGNOSIS — R404 Transient alteration of awareness: Secondary | ICD-10-CM | POA: Diagnosis not present

## 2021-08-06 DIAGNOSIS — N319 Neuromuscular dysfunction of bladder, unspecified: Secondary | ICD-10-CM

## 2021-08-06 DIAGNOSIS — I10 Essential (primary) hypertension: Secondary | ICD-10-CM | POA: Diagnosis not present

## 2021-08-06 DIAGNOSIS — R42 Dizziness and giddiness: Secondary | ICD-10-CM

## 2021-08-06 DIAGNOSIS — G301 Alzheimer's disease with late onset: Secondary | ICD-10-CM

## 2021-08-06 NOTE — Progress Notes (Signed)
Location: [facility] Room Number: 140 Place of Service:  SNF [phone])  Provider: Einar Crow MD  Code Status: DNR Goals of Care:  Advanced Directives 08/06/2021  Does Patient Have a Medical Advance Directive? Yes  Type of Estate agent of [place];Living will;Out of facility DNR (pink MOST or yellow form)  Does patient want to make changes to medical advance directive? No - Patient declined  Copy of Healthcare Power of Attorney in Chart? -  Pre-existing out of facility DNR order (yellow form or pink MOST form) -     Chief Complaint  Patient presents with   Acute Visit    Hospital follow up    HPI: Patient is a 86 y.o. female seen today for an acute visit for Readmit Admitted in the hospital from 10/13-10/16 for Change in mental status Possible post Covid Vaccination Patient has a history of Alzheimer's dementia with Aphasia, hypertension, orthostatic hypotension She also has history of recurrent UTIs and urinary incontinence.  She was send to the hospital asper Nurses notes she was having Worsening of Aphasia and Dizziness In The ED Ct scan of her head was negative for any acute Change She did have Hypodensities throughout the periventricular and subcortical white matter, most consistent with chronic small vessel ischemic change. Chronic left cerebellar infarct  She was treated with IV Meropenem for Presumed UTI though her UA was negative for any leucocytes or Nitrites Culture was negative She is now back in her Room. Was in Rehab before but got very anxious and BP went up Restarted on Losartan per Dr Fara Boros request This morning feels to be back ot her baseline Still feels weak and tired Mental status at baseline Daughter in the room wanted to know about Therapy Latter I saw her walking with her walker and mild Assist. Unable to get much history from patient due to her Aphasia No Acute issues per Daughter   Past  Medical History:  Diagnosis Date   Alzheimer disease (HCC)    Balance problem 07/19/2020   Constipation 07/19/2020   Dementia without behavioral disturbance (HCC) 07/19/2020   MMSE 21/30 07/21/20   Fall    Osteoarthritis    Pyelonephritis    Spinal stenosis 07/19/2020   Weakness of left lower extremity 07/19/2020    History reviewed. No pertinent surgical history.  Allergies  Allergen Reactions   Sulfa Antibiotics Rash    Rash to trunk, legs, neck, scalp, and arms    Outpatient Encounter Medications as of 08/06/2021  Medication Sig   acetaminophen (TYLENOL) 500 MG tablet Take 1,000 mg by mouth 3 (three) times daily as needed for moderate pain.   Cranberry (THERACRAN PO) Take 250 mg by mouth in the morning and at bedtime.   D-Mannose POWD Take 4 capsules by mouth daily. 99%   docusate sodium (COLACE) 100 MG capsule Take 100 mg by mouth 2 (two) times daily as needed for mild constipation.   estradiol (ESTRING) 2 MG vaginal ring Place 2 mg vaginally every 3 (three) months. follow package directions   ibuprofen (ADVIL) 200 MG tablet Take 400 mg by mouth 3 (three) times daily as needed for mild pain.   losartan (COZAAR) 25 MG tablet Take 25 mg by mouth daily.   nitrofurantoin (MACRODANTIN) 50 MG capsule Take 50 mg by mouth 2 (two) times daily.   senna (SENOKOT) 8.6 MG TABS tablet Take 1 tablet by mouth in the morning and at bedtime.   tamsulosin (FLOMAX) 0.4 MG CAPS  capsule Take 0.4 mg by mouth at bedtime.   No facility-administered encounter medications on file as of 08/06/2021.    Review of Systems:  Review of Systems  Unable to perform ROS: Dementia   Health Maintenance  Topic Date Due   INFLUENZA VACCINE  05/21/2021   Zoster Vaccines- Shingrix (2 of 2) 07/19/2021   TETANUS/TDAP  02/24/2031   COVID-19 Vaccine  Completed   HPV VACCINES  Aged Out   DEXA SCAN  Discontinued    Physical Exam: Vitals:   08/06/21 1526  BP: 130/89  Pulse: 62  Resp: 20  Temp: (!) 97.2 F  (36.2 C)  SpO2: 97%  Weight: 152 lb 3.2 oz (69 kg)  Height: 5\' 2"  (1.575 m)   Body mass index is 27.84 kg/m. Physical Exam Constitutional:  Well-developed and well-nourished.  HENT:  Head: Normocephalic.  Mouth/Throat: Oropharynx is clear and moist.  Eyes: Pupils are equal, round, and reactive to light.  Neck: Neck supple.  Cardiovascular: Normal rate and normal heart sounds.  No murmur heard. Pulmonary/Chest: Effort normal and breath sounds normal. No respiratory distress. No wheezes. She has no rales.  Abdominal: Soft. Bowel sounds are normal. No distension. There is no tenderness. There is no rebound.  Musculoskeletal: No edema.  Lymphadenopathy: none Neurological: Alert Responds but has Aphasia No Focal deficits . Would Follow Some Simple Commands Skin: Skin is warm and dry.  Psychiatric: Normal mood and affect. Behavior is normal. Thought content normal.   Labs reviewed: Basic Metabolic Panel: Recent Labs    06/28/21 0000 08/02/21 1926 08/03/21 0511  NA 140 135 135  K 4.4 3.8 3.6  CL 105 102 101  CO2 25* 24 25  GLUCOSE  --  107* 91  BUN 15 8 8   CREATININE 0.6 0.77 0.73  CALCIUM 9.1 9.2 8.3*   Liver Function Tests: Recent Labs    06/28/21 0000 08/02/21 1926  AST 17 26  ALT 12 18  ALKPHOS 98 80  BILITOT  --  0.9  PROT  --  7.0  ALBUMIN 3.9 3.5   No results for input(s): LIPASE, AMYLASE in the last 8760 hours. No results for input(s): AMMONIA in the last 8760 hours. CBC: Recent Labs    05/17/21 0000 08/02/21 1926 08/03/21 0511  WBC 7.1 5.4 4.8  NEUTROABS  --  4.0  --   HGB 14.2 14.3 12.5  HCT 43 45.7 38.4  MCV  --  90.0 88.5  PLT 186 169 151   Lipid Panel: No results for input(s): CHOL, HDL, LDLCALC, TRIG, CHOLHDL, LDLDIRECT in the last 8760 hours. No results found for: HGBA1C  Procedures since last visit: CT HEAD WO CONTRAST (08/04/21)  Result Date: 08/02/2021 CLINICAL DATA:  TIA symptoms EXAM: CT HEAD WITHOUT CONTRAST TECHNIQUE: Contiguous  axial images were obtained from the base of the skull through the vertex without intravenous contrast. COMPARISON:  None. FINDINGS: Brain: Patient motion limits evaluation of the skull base. There are scattered hypodensities throughout the periventricular and subcortical white matter most consistent with age-indeterminate small vessel ischemic change. Chronic infarct superior left cerebellar hemisphere. No evidence of acute hemorrhage. Lateral ventricles and remaining midline structures are unremarkable. No acute extra-axial fluid collections. No mass effect. Vascular: Diffuse atherosclerosis of the internal carotid arteries and basilar artery. No hyperdense vessel. Skull: Normal. Negative for fracture or focal lesion. Sinuses/Orbits: No acute finding. Other: None. IMPRESSION: 1. Patient motion limits evaluation. 2. Hypodensities throughout the periventricular and subcortical white matter, most consistent with chronic small vessel  ischemic change. Chronic left cerebellar infarct. 3. Otherwise no acute infarct or hemorrhage. Electronically Signed   By: Sharlet Salina M.D.   On: 08/02/2021 20:46    Assessment/Plan Transient alteration of awareness with  ? Etiology Post Vaccination UTI was ruled out  Is back to her baseline today I did talk to daughter about Neuro Referal if this happens again Also Made sure Therapy to evaluate and treat  Patient has seen Neurology before in [place] and per their notes she has Alzheimer disease   Essential hypertension Was restarted on Losartan Will Monitor her BP closely as she has h/o Orthostatics and Labile BP Did have episode of Hyperkalemia on losartan in 7/22 Will repeat BMP in 1 week.   Recurrent UTI Follows with Urology Also Seen By ID in [place] [facility] No New recommendation per them Just Continue Macrodantin  Neurogenic bladder On Flomax  Dizziness Continues to have of and On issue Combination of orthostatics ? Cerebellar Infarct Not sure if it is new  or old Not on aspirin or statin  Addendum Talked to the POA Dr Sherryll Burger and will order MRI for Dizziness and Cerebellar infarct seen in CT scan Also will consider Neuro consult depending on her MRI results  Labs/tests ordered:  MRI of head and BMP Next appt:  Visit date not found

## 2021-08-08 ENCOUNTER — Other Ambulatory Visit: Payer: Self-pay | Admitting: Internal Medicine

## 2021-08-08 DIAGNOSIS — R42 Dizziness and giddiness: Secondary | ICD-10-CM

## 2021-08-08 LAB — CULTURE, BLOOD (ROUTINE X 2)
Culture: NO GROWTH
Culture: NO GROWTH
Special Requests: ADEQUATE
Special Requests: ADEQUATE

## 2021-08-09 ENCOUNTER — Other Ambulatory Visit: Payer: Self-pay | Admitting: Internal Medicine

## 2021-08-09 NOTE — Progress Notes (Signed)
MRI order already in there

## 2021-08-14 LAB — BASIC METABOLIC PANEL
BUN: 11 (ref 4–21)
CO2: 25 — AB (ref 13–22)
Chloride: 106 (ref 99–108)
Creatinine: 0.6 (ref 0.5–1.1)
Glucose: 99
Potassium: 4.2 (ref 3.4–5.3)
Sodium: 140 (ref 137–147)

## 2021-08-14 LAB — COMPREHENSIVE METABOLIC PANEL: Calcium: 9.1 (ref 8.7–10.7)

## 2021-08-29 ENCOUNTER — Ambulatory Visit
Admission: RE | Admit: 2021-08-29 | Discharge: 2021-08-29 | Disposition: A | Discharge status: Home / Self Care | Payer: Medicare Other | Source: Ambulatory Visit | Attending: Internal Medicine | Admitting: Internal Medicine

## 2021-08-29 ENCOUNTER — Other Ambulatory Visit: Payer: Self-pay

## 2021-08-29 DIAGNOSIS — R42 Dizziness and giddiness: Secondary | ICD-10-CM

## 2021-08-29 MED ORDER — GADOBENATE DIMEGLUMINE 529 MG/ML IV SOLN
14.0000 mL | Freq: Once | INTRAVENOUS | Status: AC | PRN
  Administered 2021-08-29: 14 mL via INTRAVENOUS

## 2021-08-30 ENCOUNTER — Encounter: Payer: Self-pay | Admitting: Adult Health

## 2021-08-30 ENCOUNTER — Non-Acute Institutional Stay (SKILLED_NURSING_FACILITY): Payer: Medicare Other | Admitting: Adult Health

## 2021-08-30 DIAGNOSIS — N390 Urinary tract infection, site not specified: Secondary | ICD-10-CM

## 2021-08-30 DIAGNOSIS — F0280 Dementia in other diseases classified elsewhere without behavioral disturbance: Secondary | ICD-10-CM

## 2021-08-30 DIAGNOSIS — N319 Neuromuscular dysfunction of bladder, unspecified: Secondary | ICD-10-CM | POA: Diagnosis not present

## 2021-08-30 DIAGNOSIS — R42 Dizziness and giddiness: Secondary | ICD-10-CM

## 2021-08-30 DIAGNOSIS — Z7409 Other reduced mobility: Secondary | ICD-10-CM

## 2021-08-30 DIAGNOSIS — G301 Alzheimer's disease with late onset: Secondary | ICD-10-CM | POA: Diagnosis not present

## 2021-08-30 DIAGNOSIS — K5901 Slow transit constipation: Secondary | ICD-10-CM

## 2021-08-30 NOTE — Progress Notes (Signed)
Location:  [facility] Room Number: 140 Place of Service:  SNF [phone]) Provider: Fletcher Anon NP   Mahlon Gammon, MD  Patient Care Team: Mahlon Gammon, MD as PCP - General (Internal Medicine) Jamison Neighbor, MD (Urology)  Extended Emergency Contact Information Primary Emergency Contact: Wilhite,[name] Address: [address]          [place], [place] [address] D[place] Amber of [place] Home Phone: [phone] Work Phone: [phone] Relation: Son Secondary Emergency Contact: Stark Bray of [place] Home Phone: [phone] Work Phone: [phone] Mobile Phone: [phone] Relation: Son  Code Status:  DNR Goals of care: Advanced Directive information Advanced Directives 08/06/2021  Does Patient Have a Medical Advance Directive? Yes  Type of Estate agent of [place];Living will;Out of facility DNR (pink MOST or yellow form)  Does patient want to make changes to medical advance directive? No - Patient declined  Copy of Healthcare Power of Attorney in Chart? -  Pre-existing out of facility DNR order (yellow form or pink MOST form) -     Chief Complaint  Patient presents with   Dizziness    HPI:  Pt is a 86 y.o. female seen today for medical management of chronic diseases.     PMH includes HTN, Dementia without behavioral disturbances, orthostatic dizziness, hx of neurogenic bladder with incomplete emptying, recurrent UTI, urosepsis, spinal stenosis, constipation, gait abnormality  Patient last seen 10/17 after hospitalization from 10/13-10/16 where she was evaluated for change in mental status.  Hospital CT scan of head was negative for any acute changes but showed Hypodensities throughout the periventricular and subcortical white matter, most consistent with chronic small vessel ischemic change. Chronic left cerebellar infarct. No acute infarct or hemorrhage   Due to dizziness and  cerebellar infarct seen on CT scan MRI ordered and results 11/9 revealed no evidence of acute intracranial abnormality, Moderate chronic small vessel ischemic changes within the cerebral white matter. Few scattered supratentorial chronic microhemorrhages, nonspecific but possibly reflecting sequela of hypertensive microangiopathy.Mild generalized cerebral and cerebellar atrophy   Continues Losartan, continues to report that she is weak and tired (which is not new)  BP stable, most rent BP 124/76.   Weight variable most recent 149.6 lbs. Last month 152 lbs   Patient uses walker to assist with ambulation and staff assist with ADLs    Past Medical History:  Diagnosis Date   Alzheimer disease (HCC)    Balance problem 07/19/2020   Constipation 07/19/2020   Dementia without behavioral disturbance (HCC) 07/19/2020   MMSE 21/30 07/21/20   Fall    Osteoarthritis    Pyelonephritis    Spinal stenosis 07/19/2020   Weakness of left lower extremity 07/19/2020   No past surgical history on file.  Allergies  Allergen Reactions   Sulfa Antibiotics Rash    Rash to trunk, legs, neck, scalp, and arms    Outpatient Encounter Medications as of 08/30/2021  Medication Sig   acetaminophen (TYLENOL) 500 MG tablet Take 1,000 mg by mouth 3 (three) times daily as needed for moderate pain.   Cranberry (THERACRAN PO) Take 250 mg by mouth in the morning and at bedtime.   D-Mannose POWD Take 4 capsules by mouth daily. 99%   docusate sodium (COLACE) 100 MG capsule Take 100 mg by mouth 2 (two) times daily as needed for mild constipation.   estradiol (ESTRING) 2 MG vaginal ring Place 2 mg vaginally every 3 (three) months. follow package directions  ibuprofen (ADVIL) 200 MG tablet Take 400 mg by mouth 3 (three) times daily as needed for mild pain.   losartan (COZAAR) 25 MG tablet Take 25 mg by mouth daily.   nitrofurantoin (MACRODANTIN) 50 MG capsule Take 50 mg by mouth 2 (two) times daily.   senna (SENOKOT)  8.6 MG TABS tablet Take 1 tablet by mouth in the morning and at bedtime.   tamsulosin (FLOMAX) 0.4 MG CAPS capsule Take 0.4 mg by mouth at bedtime.   No facility-administered encounter medications on file as of 08/30/2021.    Review of Systems  Reason unable to perform ROS: limited ROS due to dementia.  Constitutional:  Positive for fatigue.   Immunization History  Administered Date(s) Administered   Influenza-Unspecified 08/05/2018, 09/29/2018, 07/02/2019, 08/21/2019, 08/11/2020   Moderna SARS-COV2 Booster Vaccination 08/31/2020, 06/12/2021   Moderna Sars-Covid-2 Vaccination 11/04/2019, 12/02/2019   Pneumococcal Conjugate-13 09/28/2018   Pneumococcal Polysaccharide-23 10/21/2017   Tdap 02/23/2021   Zoster Recombinat (Shingrix) 05/24/2021   Pertinent  Health Maintenance Due  Topic Date Due   INFLUENZA VACCINE  05/21/2021   DEXA SCAN  Discontinued   Fall Risk 07/19/2020 08/03/2021 08/04/2021 08/04/2021 08/05/2021  Falls in the past year? 1 - - - -  Was there an injury with Fall? 0 - - - -  Fall Risk Category Calculator 1 - - - -  Fall Risk Category Low - - - -  Patient Fall Risk Level Low fall risk High fall risk High fall risk High fall risk High fall risk   Functional Status Survey:    Vitals:   08/30/21 1641  BP: 124/76  Pulse: 66  Temp: (!) 97 F (36.1 C)  SpO2: 96%  Weight: 149 lb 9.6 oz (67.9 kg)   Body mass index is 27.36 kg/m. Physical Exam Constitutional:      General: She is not in acute distress. HENT:     Head: Normocephalic.     Mouth/Throat:     Mouth: Mucous membranes are moist.     Pharynx: Oropharynx is clear.  Eyes:     Pupils: Pupils are equal, round, and reactive to light.  Cardiovascular:     Rate and Rhythm: Normal rate and regular rhythm.     Heart sounds: Normal heart sounds. No murmur heard. Pulmonary:     Effort: Pulmonary effort is normal.     Breath sounds: Normal breath sounds. No wheezing or rales.  Abdominal:     General:  Bowel sounds are normal.     Palpations: Abdomen is soft.     Tenderness: There is no abdominal tenderness.  Musculoskeletal:     Cervical back: Normal range of motion.     Right lower leg: No edema.     Left lower leg: No edema.  Skin:    General: Skin is warm.  Neurological:     Mental Status: She is alert. Mental status is at baseline.     Gait: Gait abnormal.     Comments: Follows simple commands  Psychiatric:        Behavior: Behavior normal.    Labs reviewed: Recent Labs    08/02/21 1926 08/03/21 0511 08/14/21 0000  NA 135 135 140  K 3.8 3.6 4.2  CL 102 101 106  CO2 24 25 25*  GLUCOSE 107* 91  --   BUN 8 8 11   CREATININE 0.77 0.73 0.6  CALCIUM 9.2 8.3* 9.1   Recent Labs    06/28/21 0000 08/02/21 1926  AST 17 26  ALT 12 18  ALKPHOS 98 80  BILITOT  --  0.9  PROT  --  7.0  ALBUMIN 3.9 3.5   Recent Labs    05/17/21 0000 08/02/21 1926 08/03/21 0511  WBC 7.1 5.4 4.8  NEUTROABS  --  4.0  --   HGB 14.2 14.3 12.5  HCT 43 45.7 38.4  MCV  --  90.0 88.5  PLT 186 169 151   Lab Results  Component Value Date   TSH 1.13 07/20/2020   No results found for: HGBA1C Lab Results  Component Value Date   CHOL 235 (A) 07/20/2020   HDL 100 (A) 07/20/2020   LDLCALC 134 07/20/2020   TRIG 102 07/20/2020    Significant Diagnostic Results in last 30 days:  CT HEAD WO CONTRAST ( )  Result Date: 08/02/2021 CLINICAL DATA:  TIA symptoms EXAM: CT HEAD WITHOUT CONTRAST TECHNIQUE: Contiguous axial images were obtained from the base of the skull through the vertex without intravenous contrast. COMPARISON:  None. FINDINGS: Brain: Patient motion limits evaluation of the skull base. There are scattered hypodensities throughout the periventricular and subcortical white matter most consistent with age-indeterminate small vessel ischemic change. Chronic infarct superior left cerebellar hemisphere. No evidence of acute hemorrhage. Lateral ventricles and remaining midline structures  are unremarkable. No acute extra-axial fluid collections. No mass effect. Vascular: Diffuse atherosclerosis of the internal carotid arteries and basilar artery. No hyperdense vessel. Skull: Normal. Negative for fracture or focal lesion. Sinuses/Orbits: No acute finding. Other: None. IMPRESSION: 1. Patient motion limits evaluation. 2. Hypodensities throughout the periventricular and subcortical white matter, most consistent with chronic small vessel ischemic change. Chronic left cerebellar infarct. 3. Otherwise no acute infarct or hemorrhage. Electronically Signed   By: Sharlet Salina M.D.   On: 08/02/2021 20:46   MR BRAIN W WO CONTRAST  Result Date: 08/29/2021 CLINICAL DATA:  Provided history: Dizziness. Additional history provided: Fatigue/dizziness, symptoms for 4-5 months, head injury 1 year ago. EXAM: MRI HEAD WITHOUT AND WITH CONTRAST TECHNIQUE: Multiplanar, multiecho pulse sequences of the brain and surrounding structures were obtained without and with intravenous contrast. CONTRAST:  10mL MULTIHANCE GADOBENATE DIMEGLUMINE 529 MG/ML IV SOLN COMPARISON:  Head CT 08/02/2021. FINDINGS: Brain: Mild intermittent motion degradation. Mild generalized cerebral and cerebellar atrophy. Moderate multifocal T2 FLAIR hyperintense signal abnormality within the cerebral white matter, nonspecific but compatible with chronic small vessel ischemic disease. There are a few scattered supratentorial chronic microhemorrhages. There is no acute infarct. No evidence of an intracranial mass. No extra-axial fluid collection. No midline shift. No pathologic intracranial enhancement identified. Vascular: Maintained flow voids within the proximal large arterial vessels. The dominant left vertebral artery. Skull and upper cervical spine: No focal suspicious marrow lesion. Trace C4-C5 grade 1 anterolisthesis. Sinuses/Orbits: Visualized orbits show no acute finding. Bilateral lens replacements. Trace bilateral ethmoid sinus mucosal  thickening. IMPRESSION: No evidence of acute intracranial abnormality. Moderate chronic small vessel ischemic changes within the cerebral white matter. There are a few scattered supratentorial chronic microhemorrhages, nonspecific but possibly reflecting sequela of hypertensive microangiopathy. Mild generalized cerebral and cerebellar atrophy. Electronically Signed   By: Jackey Loge D.O.   On: 08/29/2021 09:31    Assessment/Plan 1. Late onset Alzheimer's dementia without behavioral disturbance (HCC) MMSE 11/30  (08/11/21) Last year MMSE 14/30 (07/10/20) Continues to benefit on skilled care unit  2. Dizziness MRI  completed 11/9 revealed no evidence of acute intracranial abnormality, Moderate chronic small vessel ischemic changes within the cerebral white matter. Few scattered supratentorial chronic microhemorrhages, nonspecific but  possibly reflecting sequela of hypertensive microangiopathy.Mild generalized cerebral and cerebellar atrophy Chronic complaint with normal vitals, dementia may play a role.   3. Recurrent UTI Followed by urology Continues Macrodantin 50 mg BID  4. Neurogenic bladder Continue Flomax   5. Slow transit constipation Continue senokot   6. Impaired mobility Walker used for ambulation assistance and fall prevention.     Family/ staff Communication: NA  Labs/tests ordered:  NA

## 2021-08-31 ENCOUNTER — Encounter: Payer: Self-pay | Admitting: Adult Health

## 2021-09-27 ENCOUNTER — Encounter: Payer: Self-pay | Admitting: Adult Health

## 2021-09-27 ENCOUNTER — Non-Acute Institutional Stay (SKILLED_NURSING_FACILITY): Payer: Medicare Other | Admitting: Adult Health

## 2021-09-27 DIAGNOSIS — N3941 Urge incontinence: Secondary | ICD-10-CM

## 2021-09-27 DIAGNOSIS — R42 Dizziness and giddiness: Secondary | ICD-10-CM

## 2021-09-27 DIAGNOSIS — F0390 Unspecified dementia without behavioral disturbance: Secondary | ICD-10-CM

## 2021-09-27 DIAGNOSIS — I10 Essential (primary) hypertension: Secondary | ICD-10-CM

## 2021-09-27 DIAGNOSIS — K5901 Slow transit constipation: Secondary | ICD-10-CM

## 2021-09-27 DIAGNOSIS — N390 Urinary tract infection, site not specified: Secondary | ICD-10-CM

## 2021-09-27 NOTE — Progress Notes (Signed)
Location:  [facility] Room Number: 140-A Place of Service:  SNF [phone]) Provider: Fletcher Anon, NP   Patient Care Team: Mahlon Gammon, MD as PCP - General (Internal Medicine) Jamison Neighbor, MD (Urology)  Extended Emergency Contact Information Primary Emergency Contact: [name],[name] Address: [address]          [place], [place] [address] Darden Amber of [place] Home Phone: [phone] Work Phone: [phone] Relation: Son Secondary Emergency Contact: Stark Bray of [place] Home Phone: [phone] Work Phone: [phone] Mobile Phone: [phone] Relation: Son  Code Status:  DNR Goals of care: Advanced Directive information Advanced Directives 09/27/2021  Does Patient Have a Medical Advance Directive? Yes  Type of Estate agent of [place];Living will;Out of facility DNR (pink MOST or yellow form)  Does patient want to make changes to medical advance directive? -  Copy of Healthcare Power of Attorney in Chart? Yes - validated most recent copy scanned in chart (See row information)  Pre-existing out of facility DNR order (yellow form or pink MOST form) -     Chief Complaint  Patient presents with   Medical Management of Chronic Issues    Routine visit, discuss need for 2nd shingrix and additional covid booster or postpone/exclude if patient refuses.     HPI:  Pt is a 86 y.o. female seen today for medical management of chronic diseases.    PMH includes HTN, Dementia without behavioral disturbances, orthostatic dizziness, hx of neurogenic bladder with incomplete emptying, recurrent UTI, urosepsis, spinal stenosis, constipation, gait abnormality AD: MMSE 11/30. Continues to remain pleasant and ambulatory.  MRI ordered and results 11/9 revealed no evidence of acute intracranial abnormality, Moderate chronic small vessel ischemic changes within the cerebral white matter. Neurogenic  bladder and urinary incontinence: no urinary symptoms. No issues emptying reported. Continues on flomax. Has estring.  HTN: on losartan Bps reviewed in matrix. Pt regularly reports dizziness at rest and with standing. Does not appear dizzy and does not fall.  Blood Pressure: 154 / 76 mmHg  Blood Pressure: 118 / 80 mmHg  Blood Pressure: 140 / 74 mmHg   Blood Pressure: 138 / 76 mmHg  Blood Pressure: 80 / 58 mmHg  Wt Readings from Last 3 Encounters:  09/27/21 149 lb 14.4 oz (68 kg)  08/30/21 149 lb 9.6 oz (67.9 kg)  08/06/21 152 lb 3.2 oz (69 kg)    Past Medical History:  Diagnosis Date   Alzheimer disease (HCC)    Balance problem 07/19/2020   Constipation 07/19/2020   Dementia without behavioral disturbance (HCC) 07/19/2020   MMSE 21/30 07/21/20   Fall    Osteoarthritis    Pyelonephritis    Spinal stenosis 07/19/2020   Weakness of left lower extremity 07/19/2020   History reviewed. No pertinent surgical history.  Allergies  Allergen Reactions   Sulfa Antibiotics Rash    Rash to trunk, legs, neck, scalp, and arms    Outpatient Encounter Medications as of 09/27/2021  Medication Sig   acetaminophen (TYLENOL) 500 MG tablet Take 1,000 mg by mouth 3 (three) times daily as needed for moderate pain.   Cranberry (THERACRAN PO) Take 250 mg by mouth in the morning and at bedtime.   D-Mannose POWD Take 4 capsules by mouth daily. 99%   docusate sodium (COLACE) 100 MG capsule Take 100 mg by mouth 2 (two) times daily as needed for mild constipation.   estradiol (ESTRING) 2 MG vaginal ring Place 2 mg  vaginally every 3 (three) months. follow package directions   ibuprofen (ADVIL) 200 MG tablet Take 400 mg by mouth 3 (three) times daily as needed for mild pain.   LOSARTAN POTASSIUM PO Take 12.5 mg by mouth daily. Hold is SBP < 100 and inform provider if B/P greater than 160/90   nitrofurantoin (MACRODANTIN) 50 MG capsule Take 50 mg by mouth 2 (two) times daily.   senna (SENOKOT) 8.6 MG TABS  tablet Take 1 tablet by mouth in the morning and at bedtime.   tamsulosin (FLOMAX) 0.4 MG CAPS capsule Take 0.4 mg by mouth at bedtime.   [DISCONTINUED] losartan (COZAAR) 25 MG tablet Take 25 mg by mouth daily.   No facility-administered encounter medications on file as of 09/27/2021.    Review of Systems  Constitutional:  Negative for activity change, appetite change, chills, diaphoresis, fatigue, fever and unexpected weight change.  HENT:  Negative for congestion.   Respiratory:  Negative for cough, shortness of breath and wheezing.   Cardiovascular:  Positive for leg swelling. Negative for chest pain and palpitations.  Gastrointestinal:  Negative for abdominal distention, abdominal pain, constipation and diarrhea.  Genitourinary:  Negative for difficulty urinating and dysuria.  Musculoskeletal:  Positive for gait problem. Negative for arthralgias, back pain, joint swelling and myalgias.  Neurological:  Positive for dizziness. Negative for tremors, seizures, syncope, facial asymmetry, speech difficulty, weakness, light-headedness, numbness and headaches.  Psychiatric/Behavioral:  Positive for confusion. Negative for agitation and behavioral problems.    Immunization History  Administered Date(s) Administered   Influenza, High Dose Seasonal PF 07/25/2021   Influenza-Unspecified 08/05/2018, 09/29/2018, 07/02/2019, 08/21/2019, 08/11/2020   Moderna Covid-19 Vaccine Bivalent Booster 59yrs & up 08/01/2021   Moderna SARS-COV2 Booster Vaccination 08/31/2020, 06/12/2021   Moderna Sars-Covid-2 Vaccination 11/04/2019, 12/02/2019   Pneumococcal Conjugate-13 09/28/2018   Pneumococcal Polysaccharide-23 10/21/2017   Tdap 02/23/2021   Zoster Recombinat (Shingrix) 05/24/2021   Pertinent  Health Maintenance Due  Topic Date Due   INFLUENZA VACCINE  Completed   DEXA SCAN  Discontinued   Fall Risk 07/19/2020 08/03/2021 08/04/2021 08/04/2021 08/05/2021  Falls in the past year? 1 - - - -  Was there an  injury with Fall? 0 - - - -  Fall Risk Category Calculator 1 - - - -  Fall Risk Category Low - - - -  Patient Fall Risk Level Low fall risk High fall risk High fall risk High fall risk High fall risk   Functional Status Survey:    Vitals:   09/27/21 1024  BP: 118/80  Resp: (!) 23  Temp: (!) 97.2 F (36.2 C)  SpO2: 98%  Weight: 149 lb 14.4 oz (68 kg)  Height: 5\' 2"  (1.575 m)   Body mass index is 27.42 kg/m. Physical Exam Vitals and nursing note reviewed.  Constitutional:      General: She is not in acute distress.    Appearance: She is not diaphoretic.  HENT:     Head: Normocephalic and atraumatic.     Mouth/Throat:     Mouth: Mucous membranes are moist.     Pharynx: Oropharynx is clear. No oropharyngeal exudate.  Eyes:     Conjunctiva/sclera: Conjunctivae normal.     Pupils: Pupils are equal, round, and reactive to light.  Neck:     Vascular: No JVD.  Cardiovascular:     Rate and Rhythm: Normal rate and regular rhythm.     Heart sounds: No murmur heard. Pulmonary:     Effort: Pulmonary effort is normal. No  respiratory distress.     Breath sounds: Normal breath sounds. No wheezing.  Abdominal:     General: Abdomen is flat. Bowel sounds are normal. There is no distension.     Palpations: Abdomen is soft.     Tenderness: There is no abdominal tenderness.  Musculoskeletal:     Comments: Trace BLE edema   Skin:    General: Skin is warm and dry.  Neurological:     Mental Status: She is alert and oriented to person, place, and time.  Psychiatric:        Mood and Affect: Mood normal.    Labs reviewed: Recent Labs    08/02/21 1926 08/03/21 0511 08/14/21 0000  NA 135 135 140  K 3.8 3.6 4.2  CL 102 101 106  CO2 24 25 25*  GLUCOSE 107* 91  --   BUN CREATININE 0.77 0.73 0.6  CALCIUM 9.2 8.3* 9.1   Recent Labs    06/28/21 0000 08/02/21 1926  AST 17 26  ALT 12 18  ALKPHOS 98 80  BILITOT  --  0.9  PROT  --  7.0  ALBUMIN 3.9 3.5   Recent Labs     05/17/21 0000 08/02/21 1926 08/03/21 0511  WBC 7.1 5.4 4.8  NEUTROABS  --  4.0  --   HGB 14.2 14.3 12.5  HCT 43 45.7 38.4  MCV  --  90.0 88.5  PLT 186 169 151   Lab Results  Component Value Date   TSH 1.13 07/20/2020   No results found for: HGBA1C Lab Results  Component Value Date   CHOL 235 (A) 07/20/2020   HDL 100 (A) 07/20/2020   LDLCALC 134 07/20/2020   TRIG 102 07/20/2020    Significant Diagnostic Results in last 30 days:  MR BRAIN W WO CONTRAST  Result Date: 08/29/2021 CLINICAL DATA:  Provided history: Dizziness. Additional history provided: Fatigue/dizziness, symptoms for 4-5 months, head injury 1 year ago. EXAM: MRI HEAD WITHOUT AND WITH CONTRAST TECHNIQUE: Multiplanar, multiecho pulse sequences of the brain and surrounding structures were obtained without and with intravenous contrast. CONTRAST:  14mL MULTIHANCE GADOBENATE DIMEGLUMINE 529 MG/ML IV SOLN COMPARISON:  Head CT 08/02/2021. FINDINGS: Brain: Mild intermittent motion degradation. Mild generalized cerebral and cerebellar atrophy. Moderate multifocal T2 FLAIR hyperintense signal abnormality within the cerebral white matter, nonspecific but compatible with chronic small vessel ischemic disease. There are a few scattered supratentorial chronic microhemorrhages. There is no acute infarct. No evidence of an intracranial mass. No extra-axial fluid collection. No midline shift. No pathologic intracranial enhancement identified. Vascular: Maintained flow voids within the proximal large arterial vessels. The dominant left vertebral artery. Skull and upper cervical spine: No focal suspicious marrow lesion. Trace C4-C5 grade 1 anterolisthesis. Sinuses/Orbits: Visualized orbits show no acute finding. Bilateral lens replacements. Trace bilateral ethmoid sinus mucosal thickening. IMPRESSION: No evidence of acute intracranial abnormality. Moderate chronic small vessel ischemic changes within the cerebral white matter. 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 Mild generalized cerebral and cerebellar atrophy. Electronically Signed   By: Jackey Loge D.O.   On: 08/29/2021 09:31    Assessment/Plan  1. Dementia without behavioral disturbance (HCC) Advanced stage Continues to benefit from the skilled care setting  2. Essential hypertension Had one low number others are acceptable. Due to hx of dizziness and low bp continue losartan at 12.5  3. Slow transit constipation Continue senokot s 1 tab bid   4.  Orthostatic dizziness Encourage fluid intake Rise slowly Monitor bp and and BMP  5. Urge incontinence With retention Continue flomax per urology to prevent retention Toileting schedule  6. Recurrent UTI Continue macrodantin 50mg  bid   Family/ staff Communication: nurse   Labs/tests ordered:  NA

## 2021-10-19 ENCOUNTER — Encounter: Payer: Self-pay | Admitting: Adult Health

## 2021-10-19 ENCOUNTER — Non-Acute Institutional Stay (INDEPENDENT_AMBULATORY_CARE_PROVIDER_SITE_OTHER): Payer: Medicare Other | Admitting: Adult Health

## 2021-10-19 DIAGNOSIS — Z0000 Encounter for general adult medical examination without abnormal findings: Secondary | ICD-10-CM | POA: Diagnosis not present

## 2021-10-19 NOTE — Progress Notes (Addendum)
Subjective:   Cheryl Monroe is a 86 y.o. female who presents for Medicare Annual (Subsequent) preventive examination at [facility].  Review of Systems     Cardiac Risk Factors include: advanced age (>54mn, >>27women);sedentary lifestyle;hypertension     Objective:    Today's Vitals   10/19/21 1142  BP: 112/60  Pulse: 75  Resp: 18  Temp: 97.8 F (36.6 C)  SpO2: 98%  Weight: 149 lb 14.4 oz (68 kg)  Height: _0  (1.575 m)   Body mass index is 27.42 kg/m.  Advanced Directives 10/19/2021 10/19/2021 09/27/2021 08/06/2021 05/17/2021 05/01/2021 04/05/2021  Does Patient Have a Medical Advance Directive? _1  Yes Yes  Type of [place] of [place] DNR (pink MOST or yellow form) HWars[place]Living will;Out of facility DNR (pink MOST or yellow form) H[place] AFBLiving will;Out of facility DNR (pink MOST or yellow form) [place]Living will;Out of facility DNR (pink MOST or yellow form) Living will;Out of facility DNR (pink MOST or yellow form) Living will;Out of facility DNR (pink MOST or yellow form) Living will;Out of facility DNR (pink MOST or yellow form)  Does patient want to make changes to medical advance directive? - No - Patient declined - No - Patient declined No - Patient declined No - Patient declined No - Patient declined  Copy of [place] Chart? Yes - validated most recent copy scanned in chart (See row information) Yes - validated most recent copy scanned in chart (See row information) Yes - validated most recent copy scanned in chart (See row information) - - - -  Pre-existing out of facility DNR order (yellow form or pink MOST form) - - - - Yellow form placed in chart (order not valid for inpatient use) Yellow form placed in chart (order not valid for inpatient use) Yellow form placed in chart (order not valid for inpatient use)    Current  Medications (verified) Outpatient Encounter Medications as of 10/19/2021  Medication Sig   acetaminophen (TYLENOL) 500 MG tablet Take 1,000 mg by mouth 3 (three) times daily as needed for moderate pain.   Cranberry (THERACRAN PO) Take 250 mg by mouth in the morning and at bedtime.   D-Mannose POWD Take 4 capsules by mouth daily. 99%   docusate sodium (COLACE) 100 MG capsule Take 100 mg by mouth 2 (two) times daily as needed for mild constipation.   estradiol (ESTRING) 2 MG vaginal ring Place 2 mg vaginally every 3 (three) months. follow package directions   ibuprofen (ADVIL) 200 MG tablet Take 400 mg by mouth 3 (three) times daily as needed for mild pain.   LOSARTAN POTASSIUM PO Take 12.5 mg by mouth daily. Hold is SBP < 100 and inform provider if B/P greater than 160/90   nitrofurantoin (MACRODANTIN) 50 MG capsule Take 50 mg by mouth 2 (two) times daily.   senna (SENOKOT) 8.6 MG TABS tablet Take 1 tablet by mouth in the morning and at bedtime.   tamsulosin (FLOMAX) 0.4 MG CAPS capsule Take 0.4 mg by mouth at bedtime.   No facility-administered encounter medications on file as of 10/19/2021.    Allergies (verified) Sulfa antibiotics   History: Past Medical History:  Diagnosis Date   Alzheimer disease ([place]iffl[place]    Balance problem 07/19/2020   Constipation 07/19/2020   Dementia without behavioral disturbance ([place] 07/19/2020   MMSE 21/30 07/21/20   Fall    Osteoarthritis  Pyelonephritis    Spinal stenosis 07/19/2020   Weakness of left lower extremity 07/19/2020   History reviewed. No pertinent surgical history. Family History  Problem Relation Age of Onset   ALS Mother    Social History   Socioeconomic History   Marital status: Legally Separated    Spouse name: Not on file   Number of children: 3   Years of education: Not on file   Highest education level: Some college, no degree  Occupational History   Not on file  Tobacco Use   Smoking status: Never   Smokeless tobacco:  Never  Vaping Use   Vaping Use: Never used  Substance and Sexual Activity   Alcohol use: Not Currently   Drug use: Not Currently   Sexual activity: Not on file  Other Topics Concern   Not on file  Social History Narrative   Not on file   Social Determinants of Health   Financial Resource Strain: Not on file  Food Insecurity: Not on file  Transportation Needs: Not on file  Physical Activity: Not on file  Stress: Not on file  Social Connections: Not on file    Tobacco Counseling Counseling given: Not Answered   Clinical Intake:  Pre-visit preparation completed: No  Pain : No/denies pain     BMI - recorded: 27.4 Nutritional Status: BMI 25 -29 Overweight Nutritional Risks: None Diabetes: No  How often do you need to have someone help you when you read instructions, pamphlets, or other written materials from your doctor or pharmacy?: 5 - [place] Needed?: No  Information entered by :: Royal Hawthorn NP   Activities of Daily Living In your present state of health, do you have any difficulty performing the following activities: 10/19/2021  Hearing? N  Vision? Y  Difficulty concentrating or making decisions? Y  Walking or climbing stairs? Y  Dressing or bathing? Y  Doing errands, shopping? Y  Preparing Food and eating ? Y  Using the Toilet? Y  In the past six months, have you accidently leaked urine? Y  Do you have problems with loss of bowel control? Y  Managing your Medications? Y  Managing your Finances? Y  Housekeeping or managing your Housekeeping? Y  Some recent data might be hidden    Patient Care Team: Virgie Dad, MD as PCP - General (Internal Medicine) Domingo Pulse, MD (Urology)  Indicate any recent Medical Services you may have received from other than [facility] providers in the past year (date may be approximate).     Assessment:   This is a routine wellness examination for Cheryl Monroe.  Hearing/Vision screen No  results found.  Dietary issues and exercise activities discussed: Current Exercise Habits: The patient does not participate in regular exercise at present, Exercise limited by: orthopedic condition(s)   Goals Addressed             This Visit's Progress    Social and Functional Skills Optimized       Evidence-based guidance:  Assess level of social support; promote maintaining links with family, friends and community to reduce social isolation.  Assess level of function related to basic activities of daily living that include eating, dressing, bathing, as well as instrumental activities of daily living such as shopping, managing finances and use of devices.  Encourage continuation of daily life components such as self-care, home maintenance, financial management, volunteer activities, education opportunities and hobbies.  Refer to occupational or physical therapy to develop comprehensive rehabilitation  plan to improve or maintain activities of daily living; consider inclusion of endurance, balance and resistance-training.  Consider complementary therapy such as yoga, music, gardening, outdoor activities, aromatherapy and tai chi.   Notes:        Depression Screen PHQ 2/9 Scores 10/19/2021 07/19/2020  PHQ - 2 Score 0 0    Fall Risk Fall Risk  10/19/2021 07/19/2020  Falls in the past year? 0 1  Number falls in past yr: 0 0  Injury with Fall? 0 0  Risk for fall due to : Impaired balance/gait -  Follow up Falls evaluation completed -    FALL RISK PREVENTION PERTAINING TO THE HOME:  Any stairs in or around the home? No  If so, are there any without handrails? No  Home free of loose throw rugs in walkways, pet beds, electrical cords, etc? Yes  Adequate lighting in your home to reduce risk of falls? Yes   ASSISTIVE DEVICES UTILIZED TO PREVENT FALLS:  Life alert? No  Use of a cane, walker or w/c? Yes  Grab bars in the bathroom? Yes  Shower chair or bench in shower? Yes   Elevated toilet seat or a handicapped toilet? Yes   TIMED UP AND GO:  Was the test performed? No .  Length of time to ambulate 10 feet: takes >30 sec sec.   Gait slow and steady with assistive device  Cognitive Function:  MMSE 08/11/21 11/30 with failed clock      Immunizations Immunization History  Administered Date(s) Administered   Influenza, High Dose Seasonal PF 07/25/2021   Influenza-Unspecified 08/05/2018, 09/29/2018, 07/02/2019, 08/21/2019, 08/11/2020   Moderna Covid-19 Vaccine Bivalent Booster 65yr & up 08/01/2021   Moderna SARS-COV2 Booster Vaccination 08/31/2020, 06/12/2021   Moderna Sars-Covid-2 Vaccination 11/04/2019, 12/02/2019   Pneumococcal Conjugate-13 09/28/2018   Pneumococcal Polysaccharide-23 10/21/2017   Tdap 02/23/2021   Zoster Recombinat (Shingrix) 05/24/2021    TDAP status: Up to date  Flu Vaccine status: Up to date  Pneumococcal vaccine status: Up to date  Covid-19 vaccine status: Completed vaccines  Qualifies for Shingles Vaccine? Yes   Zostavax completed No   Shingrix Completed?: Yes  Screening Tests Health Maintenance  Topic Date Due   Zoster Vaccines- Shingrix (2 of 2) 12/26/2021 (Originally 07/19/2021)   COVID-19 Vaccine (3 - Booster for Moderna series) 12/20/2022 (Originally 09/26/2021)   TETANUS/TDAP  02/24/2031   Pneumonia Vaccine 86 Years old  Completed   INFLUENZA VACCINE  Completed   HPV VACCINES  Aged Out   DEXA SCAN  Discontinued    Health Maintenance  There are no preventive care reminders to display for this patient.  Colorectal cancer screening: No longer required.   Mammogram status: No longer required due to na.  Bone Density status: Completed na. Results reflect: Bone density results: NORMAL. Repeat every NA years.pt in skilled care with dementia  Lung Cancer Screening: (Low Dose CT Chest recommended if Age 84-80years, 30 pack-year currently smoking OR have quit w/in 15years.) does not qualify.   Lung  Cancer Screening Referral: na  Additional Screening:  Hepatitis C Screening: does not qualify; Completed na  Vision Screening: Recommended annual ophthalmology exams for early detection of glaucoma and other disorders of the eye. Is the patient up to date with their annual eye exam?  Yes  Who is the provider or what is the name of the office in which the patient attends annual eye exams? Need more info If pt is not established with a provider, would they like to  be referred to a provider to establish care? No .   Dental Screening: Recommended annual dental exams for proper oral hygiene  Community Resource Referral / Chronic Care Management: CRR required this visit?  No   CCM required this visit?  No      Plan:     I have personally reviewed and noted the following in the patients chart:   Medical and social history Use of alcohol, tobacco or illicit drugs  Current medications and supplements including opioid prescriptions.  Functional ability and status Nutritional status Physical activity Advanced directives List of other physicians Hospitalizations, surgeries, and ER visits in previous 12 months Vitals Screenings to include cognitive, depression, and falls Referrals and appointments  In addition, I have reviewed and discussed with patient certain preventive protocols, quality metrics, and best practice recommendations. A written personalized care plan for preventive services as well as general preventive health recommendations were provided to patient.     Royal Hawthorn, NP   10/19/2021   Nurse Notes:

## 2021-10-19 NOTE — Patient Instructions (Signed)
Cheryl Monroe , Thank you for taking time to come for your Medicare Wellness Visit. I appreciate your ongoing commitment to your health goals. Please review the following plan we discussed and let me know if I can assist you in the future.   Screening recommendations/referrals: Colonoscopy aged out  Mammogram aged out  Bone Density due to dementia and skilled care status further testing would not be warranted.  Recommended yearly ophthalmology/optometry visit for glaucoma screening and checkup Recommended yearly dental visit for hygiene and checkup  Vaccinations: Influenza vaccine up to date Pneumococcal vaccine up to date Tdap vaccine up to date Shingles vaccine up to date    Advanced directives: reviewed DNR and living will  Conditions/risks identified: fall risk forgets to use walker   Next appointment: 1 year   Preventive Care 65 Years and Older, Female Preventive care refers to lifestyle choices and visits with your health care provider that can promote health and wellness. What does preventive care include? A yearly physical exam. This is also called an annual well check. Dental exams once or twice a year. Routine eye exams. Ask your health care provider how often you should have your eyes checked. Personal lifestyle choices, including: Daily care of your teeth and gums. Regular physical activity. Eating a healthy diet. Avoiding tobacco and drug use. Limiting alcohol use. Practicing safe sex. Taking low-dose aspirin every day. Taking vitamin and mineral supplements as recommended by your health care provider. What happens during an annual well check? The services and screenings done by your health care provider during your annual well check will depend on your age, overall health, lifestyle risk factors, and family history of disease. Counseling  Your health care provider may ask you questions about your: Alcohol use. Tobacco use. Drug use. Emotional well-being. Home  and relationship well-being. Sexual activity. Eating habits. History of falls. Memory and ability to understand (cognition). Work and work Astronomer. Reproductive health. Screening  You may have the following tests or measurements: Height, weight, and BMI. Blood pressure. Lipid and cholesterol levels. These may be checked every 5 years, or more frequently if you are over 19 years old. Skin check. Lung cancer screening. You may have this screening every year starting at age 1 if you have a 30-pack-year history of smoking and currently smoke or have quit within the past 15 years. Fecal occult blood test (FOBT) of the stool. You may have this test every year starting at age 80. Flexible sigmoidoscopy or colonoscopy. You may have a sigmoidoscopy every 5 years or a colonoscopy every 10 years starting at age 48. Hepatitis C blood test. Hepatitis B blood test. Sexually transmitted disease (STD) testing. Diabetes screening. This is done by checking your blood sugar (glucose) after you have not eaten for a while (fasting). You may have this done every 1-3 years. Bone density scan. This is done to screen for osteoporosis. You may have this done starting at age 105. Mammogram. This may be done every 1-2 years. Talk to your health care provider about how often you should have regular mammograms. Talk with your health care provider about your test results, treatment options, and if necessary, the need for more tests. Vaccines  Your health care provider may recommend certain vaccines, such as: Influenza vaccine. This is recommended every year. Tetanus, diphtheria, and acellular pertussis (Tdap, Td) vaccine. You may need a Td booster every 10 years. Zoster vaccine. You may need this after age 46. Pneumococcal 13-valent conjugate (PCV13) vaccine. One dose is recommended after  age 34. Pneumococcal polysaccharide (PPSV23) vaccine. One dose is recommended after age 19. Talk to your health care provider  about which screenings and vaccines you need and how often you need them. This information is not intended to replace advice given to you by your health care provider. Make sure you discuss any questions you have with your health care provider. Document Released: 11/03/2015 Document Revised: 06/26/2016 Document Reviewed: 08/08/2015 Elsevier Interactive Patient Education  2017 [employer].  Fall Prevention in the Home Falls can cause injuries. They can happen to people of all ages. There are many things you can do to make your home safe and to help prevent falls. What can I do on the outside of my home? Regularly fix the edges of walkways and driveways and fix any cracks. Remove anything that might make you trip as you walk through a door, such as a raised step or threshold. Trim any bushes or trees on the path to your home. Use bright outdoor lighting. Clear any walking paths of anything that might make someone trip, such as rocks or tools. Regularly check to see if handrails are loose or broken. Make sure that both sides of any steps have handrails. Any raised decks and porches should have guardrails on the edges. Have any leaves, snow, or ice cleared regularly. Use sand or salt on walking paths during winter. Clean up any spills in your garage right away. This includes oil or grease spills. What can I do in the bathroom? Use night lights. Install grab bars by the toilet and in the tub and shower. Do not use towel bars as grab bars. Use non-skid mats or decals in the tub or shower. If you need to sit down in the shower, use a plastic, non-slip stool. Keep the floor dry. Clean up any water that spills on the floor as soon as it happens. Remove soap buildup in the tub or shower regularly. Attach bath mats securely with double-sided non-slip rug tape. Do not have throw rugs and other things on the floor that can make you trip. What can I do in the bedroom? Use night lights. Make sure  that you have a light by your bed that is easy to reach. Do not use any sheets or blankets that are too big for your bed. They should not hang down onto the floor. Have a firm chair that has side arms. You can use this for support while you get dressed. Do not have throw rugs and other things on the floor that can make you trip. What can I do in the kitchen? Clean up any spills right away. Avoid walking on wet floors. Keep items that you use a lot in easy-to-reach places. If you need to reach something above you, use a strong step stool that has a grab bar. Keep electrical cords out of the way. Do not use floor polish or wax that makes floors slippery. If you must use wax, use non-skid floor wax. Do not have throw rugs and other things on the floor that can make you trip. What can I do with my stairs? Do not leave any items on the stairs. Make sure that there are handrails on both sides of the stairs and use them. Fix handrails that are broken or loose. Make sure that handrails are as long as the stairways. Check any carpeting to make sure that it is firmly attached to the stairs. Fix any carpet that is loose or worn. Avoid having throw rugs  at the top or bottom of the stairs. If you do have throw rugs, attach them to the floor with carpet tape. Make sure that you have a light switch at the top of the stairs and the bottom of the stairs. If you do not have them, ask someone to add them for you. What else can I do to help prevent falls? Wear shoes that: Do not have high heels. Have rubber bottoms. Are comfortable and fit you well. Are closed at the toe. Do not wear sandals. If you use a stepladder: Make sure that it is fully opened. Do not climb a closed stepladder. Make sure that both sides of the stepladder are locked into place. Ask someone to hold it for you, if possible. Clearly mark and make sure that you can see: Any grab bars or handrails. First and last steps. Where the edge of  each step is. Use tools that help you move around (mobility aids) if they are needed. These include: Canes. Walkers. Scooters. Crutches. Turn on the lights when you go into a dark area. Replace any light bulbs as soon as they burn out. Set up your furniture so you have a clear path. Avoid moving your furniture around. If any of your floors are uneven, fix them. If there are any pets around you, be aware of where they are. Review your medicines with your doctor. Some medicines can make you feel dizzy. This can increase your chance of falling. Ask your doctor what other things that you can do to help prevent falls. This information is not intended to replace advice given to you by your health care provider. Make sure you discuss any questions you have with your health care provider. Document Released: 08/03/2009 Document Revised: 03/14/2016 Document Reviewed: 11/11/2014 Elsevier Interactive Patient Education  2017 [employer].

## 2021-11-05 ENCOUNTER — Encounter: Payer: Self-pay | Admitting: Internal Medicine

## 2021-11-05 ENCOUNTER — Non-Acute Institutional Stay (SKILLED_NURSING_FACILITY): Payer: Medicare Other | Admitting: Internal Medicine

## 2021-11-05 DIAGNOSIS — N390 Urinary tract infection, site not specified: Secondary | ICD-10-CM

## 2021-11-05 DIAGNOSIS — I10 Essential (primary) hypertension: Secondary | ICD-10-CM | POA: Diagnosis not present

## 2021-11-05 DIAGNOSIS — N319 Neuromuscular dysfunction of bladder, unspecified: Secondary | ICD-10-CM

## 2021-11-05 DIAGNOSIS — R42 Dizziness and giddiness: Secondary | ICD-10-CM

## 2021-11-05 DIAGNOSIS — N3941 Urge incontinence: Secondary | ICD-10-CM | POA: Diagnosis not present

## 2021-11-05 DIAGNOSIS — F0280 Dementia in other diseases classified elsewhere without behavioral disturbance: Secondary | ICD-10-CM

## 2021-11-05 DIAGNOSIS — G301 Alzheimer's disease with late onset: Secondary | ICD-10-CM

## 2021-11-05 NOTE — Progress Notes (Signed)
Location:   [facility] Room Number: 140 Place of Service:  SNF [phone]) Provider:  Einar Crow, MD  Mahlon Gammon, MD  Patient Care Team: Mahlon Gammon, MD as PCP - General (Internal Medicine) Jamison Neighbor, MD (Urology)  Extended Emergency Contact Information Primary Emergency Contact: Claudio,[name] Address: [address]          [place], [place] [address] D[place] of [place] Home Phone: [phone] Work Phone: [phone] Relation: Son Secondary Emergency Contact: Stark Bray of [place] Home Phone: [phone] Work Phone: [phone] Mobile Phone: [phone] Relation: Son  Code Status:  DNR Goals of care: Advanced Directive information Advanced Directives 11/05/2021  Does Patient Have a Medical Advance Directive? Yes  Type of Advance Directive Living will  Does patient want to make changes to medical advance directive? No - Patient declined  Copy of Healthcare Power of Attorney in Chart? -  Pre-existing out of facility DNR order (yellow form or pink MOST form) -     Chief Complaint  Patient presents with   Medical Management of Chronic Issues    Routine follow up visit    HPI:  Pt is a 87 y.o. female seen today for medical management of chronic diseases.  And Fluctuating BP    Patient has a history of Alzheimer's dementia with Aphasia, hypertension, orthostatic hypotension She also has history of recurrent UTIs and urinary incontinence.  Her Main issue is fluctuating BP  They had to call oncall Provider as her SBP went upto 200. Her Cozaar was increased to 25 mg and now her BP is low again She also gets Dizzy very easily. Has aphasia so hard to understand  She is stable.  No Behavior issues Her weight is stable Walks with her walker No Recent Falls Wt Readings from Last 3 Encounters:  11/05/21 149 lb 14.4 oz (68 kg)  10/19/21 149 lb 14.4 oz (68 kg)  09/27/21 149 lb 14.4 oz (68 kg)    Past Medical History:  Diagnosis Date   Alzheimer disease (HCC)    Balance problem 07/19/2020   Constipation 07/19/2020   Dementia without behavioral disturbance (HCC) 07/19/2020   MMSE 21/30 07/21/20   Fall    Osteoarthritis    Pyelonephritis    Spinal stenosis 07/19/2020   Weakness of left lower extremity 07/19/2020   History reviewed. No pertinent surgical history.  Allergies  Allergen Reactions   Sulfa Antibiotics Rash    Rash to trunk, legs, neck, scalp, and arms    Allergies as of 11/05/2021       Reactions   Sulfa Antibiotics Rash   Rash to trunk, legs, neck, scalp, and arms        Medication List        Accurate as of November 05, 2021  4:19 PM. If you have any questions, ask your nurse or doctor.          acetaminophen 500 MG tablet Commonly known as: TYLENOL Take 1,000 mg by mouth 3 (three) times daily as needed for moderate pain.   D-Mannose Powd Take 4 capsules by mouth daily. 99%   docusate sodium 100 MG capsule Commonly known as: COLACE Take 100 mg by mouth 2 (two) times daily as needed for mild constipation.   estradiol 2 MG vaginal ring Commonly known as: ESTRING Place 2 mg vaginally every 3 (three) months. follow package directions   ibuprofen 200 MG tablet Commonly known as: ADVIL Take 400  mg by mouth 3 (three) times daily as needed for mild pain.   losartan 25 MG tablet Commonly known as: COZAAR Take 25 mg by mouth daily. Hold is SBP < 100 and inform provider if B/P greater than 160/90   nitrofurantoin 50 MG capsule Commonly known as: MACRODANTIN Take 50 mg by mouth 2 (two) times daily.   senna 8.6 MG Tabs tablet Commonly known as: SENOKOT Take 1 tablet by mouth in the morning and at bedtime.   tamsulosin 0.4 MG Caps capsule Commonly known as: FLOMAX Take 0.4 mg by mouth at bedtime.   THERACRAN PO Take 250 mg by mouth in the morning and at bedtime.        Review of Systems  Unable to perform ROS: Dementia    Immunization History  Administered Date(s) Administered   Influenza, High Dose Seasonal PF 07/25/2021   Influenza-Unspecified 08/05/2018, 09/29/2018, 07/02/2019, 08/21/2019, 08/11/2020   Moderna Covid-19 Vaccine Bivalent Booster 5063yrs & up 08/01/2021   Moderna SARS-COV2 Booster Vaccination 08/31/2020, 06/12/2021   Moderna Sars-Covid-2 Vaccination 11/04/2019, 12/02/2019   Pneumococcal Conjugate-13 09/28/2018   Pneumococcal Polysaccharide-23 10/21/2017   Tdap 02/23/2021   Zoster Recombinat (Shingrix) 05/24/2021   Pertinent  Health Maintenance Due  Topic Date Due   INFLUENZA VACCINE  Completed   DEXA SCAN  Discontinued   Fall Risk 08/03/2021 08/04/2021 08/04/2021 08/05/2021 10/19/2021  Falls in the past year? - - - - 0  Was there an injury with Fall? - - - - 0  Fall Risk Category Calculator - - - - 0  Fall Risk Category - - - - Low  Patient Fall Risk Level High fall risk High fall risk High fall risk High fall risk Moderate fall risk  Patient at Risk for Falls Due to - - - - Impaired balance/gait  Fall risk Follow up - - - - Falls evaluation completed   Functional Status Survey:    Vitals:   11/05/21 1614  BP: (!) 100/51  Pulse: 76  Resp: 17  Temp: (!) 97.5 F (36.4 C)  SpO2: 97%  Weight: 149 lb 14.4 oz (68 kg)  Height: 5\' 2"  (1.575 m)   Body mass index is 27.42 kg/m. Physical Exam Vitals reviewed.  Constitutional:      Appearance: Normal appearance.  HENT:     Head: Normocephalic.     Nose: Nose normal.     Mouth/Throat:     Mouth: Mucous membranes are moist.     Pharynx: Oropharynx is clear.  Eyes:     Pupils: Pupils are equal, round, and reactive to light.  Cardiovascular:     Rate and Rhythm: Normal rate and regular rhythm.     Pulses: Normal pulses.     Heart sounds: Normal heart sounds. No murmur heard. Pulmonary:     Effort: Pulmonary effort is normal.     Breath sounds: Normal breath sounds.  Abdominal:     General: Abdomen is flat. Bowel sounds  are normal.     Palpations: Abdomen is soft.  Musculoskeletal:        General: No swelling.     Cervical back: Neck supple.  Skin:    General: Skin is warm.  Neurological:     General: No focal deficit present.     Mental Status: She is alert.     Comments: Has aphasia. Was able to talk more today with me but then gets confused about her words and cant come up with right one. Very aware of her  deficits  Psychiatric:        Mood and Affect: Mood normal.        Thought Content: Thought content normal.    Labs reviewed: Recent Labs    08/02/21 1926 08/03/21 0511 08/14/21 0000  NA 135 135 140  K 3.8 3.6 4.2  CL 102 101 106  CO2 24 25 25*  GLUCOSE 107* 91  --   BUN 8 8 11   CREATININE 0.77 0.73 0.6  CALCIUM 9.2 8.3* 9.1   Recent Labs    06/28/21 0000 08/02/21 1926  AST 17 26  ALT 12 18  ALKPHOS 98 80  BILITOT  --  0.9  PROT  --  7.0  ALBUMIN 3.9 3.5   Recent Labs    05/17/21 0000 08/02/21 1926 08/03/21 0511  WBC 7.1 5.4 4.8  NEUTROABS  --  4.0  --   HGB 14.2 14.3 12.5  HCT 43 45.7 38.4  MCV  --  90.0 88.5  PLT 186 169 151   Lab Results  Component Value Date   TSH 1.13 07/20/2020   No results found for: HGBA1C Lab Results  Component Value Date   CHOL 235 (A) 07/20/2020   HDL 100 (A) 07/20/2020   LDLCALC 134 07/20/2020   TRIG 102 07/20/2020    Significant Diagnostic Results in last 30 days:  No results found.  Assessment/Plan  1. Essential hypertension Continues to have issues with Fluctuating SBP Ranging from 200 to 100 Will Split Cozaar to 12.5 mg BID to see if it can avoid it Also Wrote for Hydralazine Prn  2. Orthostatic dizziness Trying to avoid Too control of her BP  3. Urge incontinence On Flomax  4. Recurrent urinary tract infection Continue Macrodantin  5. Late onset Alzheimer's dementia without behavioral disturbance (HCC) MRI showed 08/29/21 revealed no evidence of acute intracranial abnormality, Moderate chronic small vessel  ischemic changes within the cerebral white matter. Continue Supportive care Has not tolerated any meds before MMSE 11/30 but her main issue is Aphasia  6. Neurogenic bladder On Flomax   Family/ staff Communication:   Labs/tests ordered:

## 2021-12-04 ENCOUNTER — Non-Acute Institutional Stay (SKILLED_NURSING_FACILITY): Payer: Medicare Other | Admitting: Orthopedic Surgery

## 2021-12-04 ENCOUNTER — Encounter: Payer: Self-pay | Admitting: Orthopedic Surgery

## 2021-12-04 DIAGNOSIS — R42 Dizziness and giddiness: Secondary | ICD-10-CM | POA: Diagnosis not present

## 2021-12-04 DIAGNOSIS — G301 Alzheimer's disease with late onset: Secondary | ICD-10-CM

## 2021-12-04 DIAGNOSIS — I10 Essential (primary) hypertension: Secondary | ICD-10-CM | POA: Diagnosis not present

## 2021-12-04 DIAGNOSIS — N319 Neuromuscular dysfunction of bladder, unspecified: Secondary | ICD-10-CM

## 2021-12-04 DIAGNOSIS — F0280 Dementia in other diseases classified elsewhere without behavioral disturbance: Secondary | ICD-10-CM

## 2021-12-04 DIAGNOSIS — L853 Xerosis cutis: Secondary | ICD-10-CM

## 2021-12-04 DIAGNOSIS — N3941 Urge incontinence: Secondary | ICD-10-CM | POA: Diagnosis not present

## 2021-12-04 DIAGNOSIS — K5901 Slow transit constipation: Secondary | ICD-10-CM

## 2021-12-04 DIAGNOSIS — N390 Urinary tract infection, site not specified: Secondary | ICD-10-CM

## 2021-12-04 NOTE — Progress Notes (Signed)
Location:  [facility] Room Number: 140 Place of Service:  SNF [phone]) Provider:  Ladarrius Bogdanski- AGNP-C  Mahlon Gammon, MD  Patient Care Team: Mahlon Gammon, MD as PCP - General (Internal Medicine) Jamison Neighbor, MD (Urology)  Extended Emergency Contact Information Primary Emergency Contact: Marcell,[name] Address: [address]          [place], [place] [address] Darden Amber of [place] Home Phone: [phone] Work Phone: [phone] Relation: Son Secondary Emergency Contact: Stark Bray of [place] Home Phone: [phone] Work Phone: [phone] Mobile Phone: [phone] Relation: Son  Code Status:  DNR Goals of care: Advanced Directive information Advanced Directives 11/05/2021  Does Patient Have a Medical Advance Directive? Yes  Type of Advance Directive Living will  Does patient want to make changes to medical advance directive? No - Patient declined  Copy of Healthcare Power of Attorney in Chart? -  Pre-existing out of facility DNR order (yellow form or pink MOST form) -     Chief Complaint  Patient presents with   Medical Management of Chronic Issues    HPI:  Pt is a 87 y.o. female seen today for medical management of chronic diseases.    She currently resides on the skilled nursing unit at [facility] due to Alzheimer's dementia. Past medical history includes: hypertension, recurrent UTI, constipation, weakness and spinal stenosis.  Alzheimer's- MMSE 11/30, MRI brain noted moderate chronic ischemic changes in cerebral white matter, no recent behavioral outbursts, aphasia, ambulates well with walker, will eat in dinning room and attend bingo at times HTN- BUN/creat 11/0.6 08/14/2021, losartan divided into two doses last month, SBP< 150, see below Dizziness- becomes dizzy easily, no recent episodes per nursing, no recent falls Urge incontinence- remains on Flomax Neurogenic bladder- remains on  Flomax Recurrent UTI- remains on nitrofurantoin Dry skin- noted to back and lower legs during exam, she denies itching Constipation- LBM 02/13, remains on senna  No recent falls or injuries.   Recent blood pressures:  02/12- 157/73  02/08- 105/51  02/07- 105/24  02/05- 148/86  Recent weights:  12/02- 152.3 lbs  12/01- 149.9 lbs  11/01- 149.6 lbs    Past Medical History:  Diagnosis Date   Alzheimer disease (HCC)    Balance problem 07/19/2020   Constipation 07/19/2020   Dementia without behavioral disturbance (HCC) 07/19/2020   MMSE 21/30 07/21/20   Fall    Osteoarthritis    Pyelonephritis    Spinal stenosis 07/19/2020   Weakness of left lower extremity 07/19/2020   No past surgical history on file.  Allergies  Allergen Reactions   Sulfa Antibiotics Rash    Rash to trunk, legs, neck, scalp, and arms    Outpatient Encounter Medications as of 12/04/2021  Medication Sig   acetaminophen (TYLENOL) 500 MG tablet Take 1,000 mg by mouth 3 (three) times daily as needed for moderate pain.   Cranberry (THERACRAN PO) Take 250 mg by mouth in the morning and at bedtime.   D-Mannose POWD Take 4 capsules by mouth daily. 99%   docusate sodium (COLACE) 100 MG capsule Take 100 mg by mouth 2 (two) times daily as needed for mild constipation.   estradiol (ESTRING) 2 MG vaginal ring Place 2 mg vaginally every 3 (three) months. follow package directions   hydrALAZINE (APRESOLINE) 10 MG tablet Take 10 mg by mouth 3 (three) times daily as needed.   ibuprofen (ADVIL) 200 MG tablet Take 400 mg by mouth 3 (three) times  daily as needed for mild pain.   losartan (COZAAR) 25 MG tablet Take 12.5 mg by mouth 2 (two) times daily. Hold is SBP < 100 and inform provider if B/P greater than 160/90   nitrofurantoin (MACRODANTIN) 50 MG capsule Take 50 mg by mouth 2 (two) times daily.   senna (SENOKOT) 8.6 MG TABS tablet Take 1 tablet by mouth in the morning and at bedtime.   tamsulosin (FLOMAX) 0.4 MG CAPS  capsule Take 0.4 mg by mouth at bedtime.   No facility-administered encounter medications on file as of 12/04/2021.    Review of Systems  Unable to perform ROS: Dementia   Immunization History  Administered Date(s) Administered   Influenza, High Dose Seasonal PF 07/25/2021   Influenza-Unspecified 08/05/2018, 09/29/2018, 07/02/2019, 08/21/2019, 08/11/2020   Moderna Covid-19 Vaccine Bivalent Booster 6yrs & up 08/01/2021   Moderna SARS-COV2 Booster Vaccination 08/31/2020, 06/12/2021   Moderna Sars-Covid-2 Vaccination 11/04/2019, 12/02/2019   Pneumococcal Conjugate-13 09/28/2018   Pneumococcal Polysaccharide-23 10/21/2017   Tdap 02/23/2021   Zoster Recombinat (Shingrix) 05/24/2021   Pertinent  Health Maintenance Due  Topic Date Due   INFLUENZA VACCINE  Completed   DEXA SCAN  Discontinued   Fall Risk 08/03/2021 08/04/2021 08/04/2021 08/05/2021 10/19/2021  Falls in the past year? - - - - 0  Was there an injury with Fall? - - - - 0  Fall Risk Category Calculator - - - - 0  Fall Risk Category - - - - Low  Patient Fall Risk Level High fall risk High fall risk High fall risk High fall risk Moderate fall risk  Patient at Risk for Falls Due to - - - - Impaired balance/gait  Fall risk Follow up - - - - Falls evaluation completed   Functional Status Survey:    Vitals:   12/04/21 1146  BP: (!) 157/73  Pulse: 94  Resp: 18  Temp: 97.6 F (36.4 C)  SpO2: 94%  Weight: 152 lb 4.8 oz (69.1 kg)   Body mass index is 27.86 kg/m. Physical Exam Vitals reviewed.  Constitutional:      General: She is not in acute distress. HENT:     Head: Normocephalic.     Right Ear: There is no impacted cerumen.     Left Ear: There is no impacted cerumen.     Nose: Nose normal.     Mouth/Throat:     Mouth: Mucous membranes are moist.  Eyes:     General:        Right eye: No discharge.        Left eye: No discharge.  Neck:     Vascular: No carotid bruit.  Cardiovascular:     Rate and Rhythm:  Normal rate and regular rhythm.     Pulses: Normal pulses.     Heart sounds: Normal heart sounds. No murmur heard. Pulmonary:     Effort: Pulmonary effort is normal. No respiratory distress.     Breath sounds: Normal breath sounds. No wheezing.  Abdominal:     General: Bowel sounds are normal. There is no distension.     Palpations: Abdomen is soft.     Tenderness: There is no abdominal tenderness.  Musculoskeletal:     Cervical back: Neck supple.     Right lower leg: No edema.     Left lower leg: No edema.  Lymphadenopathy:     Cervical: No cervical adenopathy.  Skin:    General: Skin is warm and dry.     Comments: Flaking  skin to back and lower extremities, no skin breakdown  Neurological:     General: No focal deficit present.     Mental Status: She is alert. Mental status is at baseline.     Motor: Weakness present.     Gait: Gait abnormal.     Comments: walker  Psychiatric:        Mood and Affect: Mood normal.        Behavior: Behavior normal.        Cognition and Memory: Memory is impaired.     Comments: Very pleasant, follows commands, alert to self and familiar faces    Labs reviewed: Recent Labs    08/02/21 1926 08/03/21 0511 08/14/21 0000  NA 135 135 140  K 3.8 3.6 4.2  CL 102 101 106  CO2 24 25 25*  GLUCOSE 107* 91  --   BUN 8 8 11   CREATININE 0.77 0.73 0.6  CALCIUM 9.2 8.3* 9.1   Recent Labs    06/28/21 0000 08/02/21 1926  AST 17 26  ALT 12 18  ALKPHOS 98 80  BILITOT  --  0.9  PROT  --  7.0  ALBUMIN 3.9 3.5   Recent Labs    05/17/21 0000 08/02/21 1926 08/03/21 0511  WBC 7.1 5.4 4.8  NEUTROABS  --  4.0  --   HGB 14.2 14.3 12.5  HCT 43 45.7 38.4  MCV  --  90.0 88.5  PLT 186 169 151   Lab Results  Component Value Date   TSH 1.13 07/20/2020   No results found for: HGBA1C Lab Results  Component Value Date   CHOL 235 (A) 07/20/2020   HDL 100 (A) 07/20/2020   LDLCALC 134 07/20/2020   TRIG 102 07/20/2020    Significant Diagnostic  Results in last 30 days:  No results found.  Assessment/Plan 1. Late onset Alzheimer's dementia without behavioral disturbance (HCC) - no behavioral outbursts - ambulating with walker - aphasia - cont skilled nursing care  2. Essential hypertension - controlled - losartan dose split into two doses last month - recommend blood pressures bid x 5 days- give report to provider - cont losartan  3. Orthostatic dizziness - cont falls safety measures  4. Urge incontinence - cont Flomax  5. Neurogenic bladder - cont Flomax  6. Recurrent urinary tract infection - cont nitrofurantoin  7. Dry skin - flaking skin to back and extremities - start Cerave- apply to back and extremities daily  8. Slow transit constipation - LBM 02/13, abdomen soft - cont senna    Family/ staff Communication: plan discussed with patient and nurse  Labs/tests ordered:  none

## 2021-12-04 NOTE — Progress Notes (Signed)
°  Subjective:     Patient ID: Cheryl Monroe, female   DOB: 1935-03-27, 87 y.o.   MRN: [id number]  HPI  Pt seen today for routine medical visit. Awake and lying in bed. Very pleasant and cooperative. Has trouble completing sentences due to aphasia, but able to communicate well. Denies any pain or acute concerns.  Able to Identify members of her family from picture in her room, but unable to recall current year or location. She states that the days just run together, so she doesn't keep up anymore.Follows commands. Ambulates with walker.   Endorses having a good appetite, and weight has remained stable.   Review of Systems  Constitutional:  Negative for appetite change, fatigue and fever.  HENT:  Positive for congestion. Negative for trouble swallowing.   Eyes:  Negative for discharge and visual disturbance.  Respiratory:  Positive for cough. Negative for shortness of breath and wheezing.        Chronic cough per pt.   Cardiovascular:  Negative for chest pain and palpitations.  Gastrointestinal:  Negative for constipation, diarrhea and nausea.  Genitourinary:  Positive for urgency.  Musculoskeletal:  Negative for arthralgias.  Neurological:  Positive for dizziness.  Psychiatric/Behavioral:  Positive for confusion. Negative for agitation, behavioral problems and dysphoric mood.       Objective:   Physical Exam Constitutional:      Appearance: Normal appearance.  HENT:     Mouth/Throat:     Mouth: Mucous membranes are moist.  Eyes:     Conjunctiva/sclera: Conjunctivae normal.  Cardiovascular:     Rate and Rhythm: Normal rate and regular rhythm.     Pulses: Normal pulses.     Heart sounds: Normal heart sounds.  Pulmonary:     Effort: Pulmonary effort is normal.     Breath sounds: Normal breath sounds.  Abdominal:     General: There is no distension.     Palpations: Abdomen is soft.     Tenderness: There is no abdominal tenderness.  Musculoskeletal:     Right lower leg: Edema  present.     Left lower leg: Edema present.     Comments: Trace, non-pitting  Neurological:     Mental Status: She is alert. Mental status is at baseline.     Gait: Gait abnormal.     Comments: Ambulates with walker Expressive aphasia  Psychiatric:        Mood and Affect: Mood normal.        Behavior: Behavior normal.       Assessment:      1. Late onset Alzheimer's dementia without behavioral disturbance ([place]) - MMSE 11/30 (08/11/21) - Aphasia r/t dementia - Ambulates with walker - Continue skilled nursing unit  2. Neurogenic bladder - Continue Flomax  3. Recurrent urinary tract infection - Followed by urology - Continue Macrodantin  4. Essential hypertension - Continue Losartan  5. Orthostatic dizziness - Has had fluctuating blood pressures - No recent falls - Continue falls precautions

## 2021-12-25 ENCOUNTER — Non-Acute Institutional Stay (SKILLED_NURSING_FACILITY): Payer: Medicare Other | Admitting: Orthopedic Surgery

## 2021-12-25 ENCOUNTER — Encounter: Payer: Self-pay | Admitting: Orthopedic Surgery

## 2021-12-25 DIAGNOSIS — R42 Dizziness and giddiness: Secondary | ICD-10-CM

## 2021-12-25 DIAGNOSIS — I10 Essential (primary) hypertension: Secondary | ICD-10-CM

## 2021-12-25 DIAGNOSIS — M25562 Pain in left knee: Secondary | ICD-10-CM

## 2021-12-25 DIAGNOSIS — F0280 Dementia in other diseases classified elsewhere without behavioral disturbance: Secondary | ICD-10-CM

## 2021-12-25 DIAGNOSIS — N3941 Urge incontinence: Secondary | ICD-10-CM

## 2021-12-25 DIAGNOSIS — L853 Xerosis cutis: Secondary | ICD-10-CM

## 2021-12-25 DIAGNOSIS — M25561 Pain in right knee: Secondary | ICD-10-CM

## 2021-12-25 DIAGNOSIS — G8929 Other chronic pain: Secondary | ICD-10-CM

## 2021-12-25 DIAGNOSIS — G301 Alzheimer's disease with late onset: Secondary | ICD-10-CM

## 2021-12-25 DIAGNOSIS — K120 Recurrent oral aphthae: Secondary | ICD-10-CM | POA: Diagnosis not present

## 2021-12-25 DIAGNOSIS — K5901 Slow transit constipation: Secondary | ICD-10-CM

## 2021-12-25 DIAGNOSIS — N319 Neuromuscular dysfunction of bladder, unspecified: Secondary | ICD-10-CM

## 2021-12-25 DIAGNOSIS — N390 Urinary tract infection, site not specified: Secondary | ICD-10-CM

## 2021-12-25 MED ORDER — ACETAMINOPHEN 500 MG PO TABS: 1000.0000 mg | ORAL_TABLET | Freq: Every morning | ORAL | 0 refills | Status: DC

## 2021-12-25 NOTE — Progress Notes (Signed)
?  Subjective:  ?  ? Patient ID: Cheryl Monroe, female   DOB: 01-Mar-1935, 87 y.o.   MRN: [id number] ? ?HPI ?Pt seen today for medical management of chronic issues. Resides on SNF unit at [facility] and was seen lying in bed watching TV.  ? ?Pt bit her lip while eating a few days ago and has a white patchy area with surrounding erythema, complains of pain to that area. Pt also complains of bilateral knee pain that is only present when moving.  ? ?Patient is very pleasant and cooperative. Has word finding difficulty but able to communicate effectively. Oriented to person, and familiar faces. Answers questions appropriately and was able to tell me when and how she bit her lip.  Ambulates with rolling walker.  ? ?Review of Systems  ?Constitutional:  Negative for appetite change and fatigue.  ?HENT:  Positive for mouth sores. Negative for trouble swallowing.   ?Respiratory:  Negative for cough and shortness of breath.   ?Cardiovascular:  Negative for chest pain and leg swelling.  ?Gastrointestinal:  Negative for constipation, diarrhea and nausea.  ?Musculoskeletal:  Positive for arthralgias.  ?Skin:  Negative for wound.  ?Psychiatric/Behavioral:  Positive for confusion. Negative for agitation and behavioral problems.   ? ?   ?Objective:  ? Physical Exam ?Constitutional:   ?   General: She is not in acute distress. ?   Appearance: Normal appearance. She is not toxic-appearing.  ?HENT:  ?   Mouth/Throat:  ?   Mouth: Oral lesions present.  ?   Comments: Bottom lip with white patchy area and surrounding erythema ?Eyes:  ?   Conjunctiva/sclera: Conjunctivae normal.  ?Cardiovascular:  ?   Pulses: Normal pulses.  ?   Heart sounds: Normal heart sounds. No murmur heard. ?Pulmonary:  ?   Effort: Pulmonary effort is normal.  ?   Breath sounds: Normal breath sounds.  ?Abdominal:  ?   General: Bowel sounds are normal. There is no distension.  ?   Palpations: Abdomen is soft.  ?   Tenderness: There is no abdominal tenderness.   ?Neurological:  ?   Mental Status: She is alert. Mental status is at baseline.  ?   Gait: Gait abnormal.  ? ? ?   ?Assessment/Plan:  ?   ?1. Dementia without behavioral disturbance (HCC) ?- Aphasia  ?- No behavioral outbursts ?- MMSE 11/30 ?-Continue SNF setting ? ?2. Essential hypertension ?- Controlled ?- Continue losartan and PRN hydralazine ?- BUN 11/ Cr 0.6 08/14/21 ? ?3. Neurogenic bladder ?- Continue flomax ? ?4. Slow transit constipation ?- Continue senokot and PRN colace ? ?5. Canker sore ?- R/t injury ?- Start oragel TID x 1 week, then TID PRN x 1 month ? ?6. Pain in both knees, unspecified chronicity ?- Suspect arthritic changes ?- Change tylenol from 500 mg TID PRN to 1000 mg every morning ?- Encourage ambulation  ? ?7. Recurrent UTI ?- Denies dysuria ?- Continue nitrofurantoin ?   ?  ?   ?

## 2021-12-25 NOTE — Progress Notes (Signed)
Location:  [facility] Room Number: 140 Place of Service:  SNF [phone]) Provider:  Hazle Nordmann, AGNP-C  Mahlon Gammon, MD  Patient Care Team: Mahlon Gammon, MD as PCP - General (Internal Medicine) Jamison Neighbor, MD (Urology)  Extended Emergency Contact Information Primary Emergency Contact: Popwell,[name] Address: [address]          [place], [place] [address] Darden Amber of [place] Home Phone: [phone] Work Phone: [phone] Relation: Son Secondary Emergency Contact: Stark Bray of [place] Home Phone: [phone] Work Phone: [phone] Mobile Phone: [phone] Relation: Son  Code Status:  DNR Goals of care: Advanced Directive information Advanced Directives 11/05/2021  Does Patient Have a Medical Advance Directive? Yes  Type of Advance Directive Living will  Does patient want to make changes to medical advance directive? No - Patient declined  Copy of Healthcare Power of Attorney in Chart? -  Pre-existing out of facility DNR order (yellow form or pink MOST form) -     Chief Complaint  Patient presents with   Medical Management of Chronic Issues    HPI:  Pt is a 87 y.o. female seen today for medical management of chronic diseases.    She currently resides on the skilled nursing unit at [facility] due to Alzheimer's dementia. Past medical history includes: hypertension, recurrent UTI, constipation, weakness and spinal stenosis.   Lower lip injury- reports biting her lip while with family a few days ago, pain with eating and talking Alzheimer's- MMSE 11/30, MRI brain noted moderate chronic ischemic changes in cerebral white matter, no recent behavioral outbursts, aphasia, ambulates well with walker, very pleasant, able to express needs HTN- BUN/creat 11/0.6 08/14/2021, losartan divided into two doses now, see bp trends below, remains on hydralazine prn for SBP> 180 Orthostatic dizziness- no  recent episodes per nursing, no recent falls Urge incontinence- remains on Flomax Neurogenic bladder- remains on Flomax Recurrent UTI- remains on nitrofurantoin Dry skin- improved since starting Cerave last month Constipation- LBM 03/07, remains on senna Bilateral knee pain- reports increased knee pain with movement, remains on tylenol prn   No recent falls or injuries.   Recent blood pressures:  03/07- 93/58  03/06- 137/74  03/02- 156/79  Recent weights:  03/01- 148.6 lbs  02/02- 152.3 lbs  12/01- 149.9 lbs   Past Medical History:  Diagnosis Date   Alzheimer disease (HCC)    Balance problem 07/19/2020   Constipation 07/19/2020   Dementia without behavioral disturbance (HCC) 07/19/2020   MMSE 21/30 07/21/20   Fall    Osteoarthritis    Pyelonephritis    Spinal stenosis 07/19/2020   Weakness of left lower extremity 07/19/2020   No past surgical history on file.  Allergies  Allergen Reactions   Sulfa Antibiotics Rash    Rash to trunk, legs, neck, scalp, and arms    Outpatient Encounter Medications as of 12/25/2021  Medication Sig   acetaminophen (TYLENOL) 500 MG tablet Take 1,000 mg by mouth 3 (three) times daily as needed for moderate pain.   Cranberry (THERACRAN PO) Take 250 mg by mouth in the morning and at bedtime.   D-Mannose POWD Take 4 capsules by mouth daily. 99%   docusate sodium (COLACE) 100 MG capsule Take 100 mg by mouth 2 (two) times daily as needed for mild constipation.   estradiol (ESTRING) 2 MG vaginal ring Place 2 mg vaginally every 3 (three) months. follow package directions   hydrALAZINE (APRESOLINE) 10 MG tablet Take  10 mg by mouth 3 (three) times daily as needed.   ibuprofen (ADVIL) 200 MG tablet Take 400 mg by mouth 3 (three) times daily as needed for mild pain.   losartan (COZAAR) 25 MG tablet Take 12.5 mg by mouth 2 (two) times daily. Hold is SBP < 100 and inform provider if B/P greater than 160/90   nitrofurantoin (MACRODANTIN) 50 MG capsule Take 50  mg by mouth 2 (two) times daily.   senna (SENOKOT) 8.6 MG TABS tablet Take 1 tablet by mouth in the morning and at bedtime.   tamsulosin (FLOMAX) 0.4 MG CAPS capsule Take 0.4 mg by mouth at bedtime.   No facility-administered encounter medications on file as of 12/25/2021.    Review of Systems  Unable to perform ROS: Dementia   Immunization History  Administered Date(s) Administered   Influenza, High Dose Seasonal PF 07/25/2021   Influenza-Unspecified 08/05/2018, 09/29/2018, 07/02/2019, 08/21/2019, 08/11/2020   Moderna Covid-19 Vaccine Bivalent Booster 14yrs & up 08/01/2021   Moderna SARS-COV2 Booster Vaccination 08/31/2020, 06/12/2021   Moderna Sars-Covid-2 Vaccination 11/04/2019, 12/02/2019   Pneumococcal Conjugate-13 09/28/2018   Pneumococcal Polysaccharide-23 10/21/2017   Tdap 02/23/2021   Zoster Recombinat (Shingrix) 05/24/2021   Pertinent  Health Maintenance Due  Topic Date Due   INFLUENZA VACCINE  Completed   DEXA SCAN  Discontinued   Fall Risk 08/03/2021 08/04/2021 08/04/2021 08/05/2021 10/19/2021  Falls in the past year? - - - - 0  Was there an injury with Fall? - - - - 0  Fall Risk Category Calculator - - - - 0  Fall Risk Category - - - - Low  Patient Fall Risk Level High fall risk High fall risk High fall risk High fall risk Moderate fall risk  Patient at Risk for Falls Due to - - - - Impaired balance/gait  Fall risk Follow up - - - - Falls evaluation completed   Functional Status Survey:    Vitals:   12/25/21 1205  BP: (!) 93/58  Pulse: 64  Resp: 18  Temp: (!) 97.2 F (36.2 C)  SpO2: 97%  Weight: 148 lb 9.6 oz (67.4 kg)   Body mass index is 27.18 kg/m. Physical Exam Vitals reviewed.  Constitutional:      General: She is not in acute distress. HENT:     Head: Normocephalic.     Right Ear: There is no impacted cerumen.     Left Ear: There is no impacted cerumen.     Nose: Nose normal.     Mouth/Throat:     Mouth: Mucous membranes are moist.      Comments: Small white patch to inner lower lip Eyes:     General:        Right eye: No discharge.        Left eye: No discharge.  Neck:     Vascular: No carotid bruit.  Cardiovascular:     Rate and Rhythm: Normal rate and regular rhythm.     Pulses: Normal pulses.     Heart sounds: Normal heart sounds.  Pulmonary:     Effort: Pulmonary effort is normal.     Breath sounds: Normal breath sounds.  Abdominal:     General: Bowel sounds are normal. There is no distension.     Palpations: Abdomen is soft.     Tenderness: There is no abdominal tenderness.  Musculoskeletal:     Cervical back: Neck supple.     Right lower leg: No edema.     Left lower  leg: No edema.  Lymphadenopathy:     Cervical: No cervical adenopathy.  Skin:    General: Skin is warm and dry.  Neurological:     General: No focal deficit present.     Mental Status: She is alert. Mental status is at baseline.     Motor: Weakness present.     Gait: Gait abnormal.     Comments: walker  Psychiatric:        Mood and Affect: Mood normal.        Behavior: Behavior normal.        Cognition and Memory: Memory is impaired.     Comments: Very pleasant, follows commands, alert to self and familiar faces    Labs reviewed: Recent Labs    08/02/21 1926 08/03/21 0511 08/14/21 0000  NA 135 135 140  K 3.8 3.6 4.2  CL 102 101 106  CO2 24 25 25*  GLUCOSE 107* 91  --   BUN 8 8 11   CREATININE 0.77 0.73 0.6  CALCIUM 9.2 8.3* 9.1   Recent Labs    06/28/21 0000 08/02/21 1926  AST 17 26  ALT 12 18  ALKPHOS 98 80  BILITOT  --  0.9  PROT  --  7.0  ALBUMIN 3.9 3.5   Recent Labs    05/17/21 0000 08/02/21 1926 08/03/21 0511  WBC 7.1 5.4 4.8  NEUTROABS  --  4.0  --   HGB 14.2 14.3 12.5  HCT 43 45.7 38.4  MCV  --  90.0 88.5  PLT 186 169 151   Lab Results  Component Value Date   TSH 1.13 07/20/2020   No results found for: HGBA1C Lab Results  Component Value Date   CHOL 235 (A) 07/20/2020   HDL 100 (A)  07/20/2020   LDLCALC 134 07/20/2020   TRIG 102 07/20/2020    Significant Diagnostic Results in last 30 days:  No results found.  Assessment/Plan 1. Canker sores oral - noted to lower lip - will start Orajel- apply to lower lip TID x 7 days, then TID prn x 1 month - advised to avoid spicy foods  2. Late onset Alzheimer's dementia without behavioral disturbance (HCC) - no behavioral outbursts - aphasia - ambulates with rolator - cont skilled nursing care  3. Essential hypertension - controlled - cont losartan  4. Orthostatic dizziness - no recent episodes  5. Urge incontinence - cont flomax  6. Neurogenic bladder - cont flomax  7. Recurrent urinary tract infection - cont nitrofurantoin - cont cranberry supplement for prevention  8. Dry skin - improved with Cerave lotion daily  9. Slow transit constipation - LBM 03/07, abdomen soft - cont senna  10. Bilateral knee pain - reports increased pain with movement - suspect due to prolonged sitting/arthritis - start tylenol 1000 mg po qam - continue tylenol 1000 mg bid prn for additional pain    Family/ staff Communication: plan discussed with patient and nurse  Labs/tests ordered:  none

## 2021-12-27 ENCOUNTER — Non-Acute Institutional Stay (SKILLED_NURSING_FACILITY): Payer: Medicare Other | Admitting: Adult Health

## 2021-12-27 ENCOUNTER — Encounter: Payer: Self-pay | Admitting: Adult Health

## 2021-12-27 DIAGNOSIS — K5901 Slow transit constipation: Secondary | ICD-10-CM | POA: Diagnosis not present

## 2021-12-27 DIAGNOSIS — K644 Residual hemorrhoidal skin tags: Secondary | ICD-10-CM

## 2021-12-27 NOTE — Progress Notes (Signed)
[facility]   Place of Service:  SNF (31) Provider:   Peggye Leyhristy Rodgers Likes, ANP [facility] [phone]   Mahlon GammonGupta, Anjali L, MD  Patient Care Team: Mahlon GammonGupta, Anjali L, MD as PCP - General (Internal Medicine) Jamison NeighborEvans, Robert J, MD (Urology)  Extended Emergency Contact Information Primary Emergency Contact: Berrong,[name] Address: [address]          [place], [place] [address] [place] Home Phone: [phone] Work Phone: [phone] Relation: Son Secondary Emergency Contact: Stark BrayShah, Sanjiv  [place] Home Phone: [phone] Work Phone: [phone] Mobile Phone: [phone] Relation: Son  Code Status:  DNR Goals of care: Advanced Directive information Advanced Directives 11/05/2021  Does Patient Have a Medical Advance Directive? Yes  Type of Advance Directive Living will  Does patient want to make changes to medical advance directive? No - Patient declined  Copy of Healthcare Power of Attorney in Chart? -  Pre-existing out of facility DNR order (yellow form or pink MOST form) -     Chief Complaint  Patient presents with   Acute Visit    Rectal bleeding    HPI:  Pt is a 87 y.o. female seen today for an acute visit for rectal bleeding. The nurse requested she be seen for rectal bleeding noted on 3/7.  The CNA reported to the nurse. IT was a one time event. No further bleeding was noted. Due to the patient's dementia she can not add to the history. She does deny any abd pain at this time. She ate breakfast and is standing in her room brushing her teeth for the visit. Bowel movements are recorded most day so the week.  Wt Readings from Last 3 Encounters:  12/25/21 148 lb 9.6 oz (67.4 kg)  12/04/21 152 lb 4.8 oz (69.1 kg)  11/05/21 149 lb 14.4 oz (68 kg)      Past Medical History:  Diagnosis Date   Alzheimer disease (HCC)    Balance problem 07/19/2020   Constipation 07/19/2020   Dementia  without behavioral disturbance (HCC) 07/19/2020   MMSE 21/30 07/21/20   Fall    Osteoarthritis    Pyelonephritis    Spinal stenosis 07/19/2020   Weakness of left lower extremity 07/19/2020   No past surgical history on file.  Allergies  Allergen Reactions   Sulfa Antibiotics Rash    Rash to trunk, legs, neck, scalp, and arms    Outpatient Encounter Medications as of 12/27/2021  Medication Sig   benzocaine (ORAJEL) 10 % mucosal gel Use as directed 1 application. in the mouth or throat in the morning, at noon, and at bedtime.   hydrocortisone cream 0.5 % Apply 1 application. topically at bedtime.   acetaminophen (TYLENOL) 500 MG tablet Take 1,000 mg by mouth 2 (two) times daily as needed for moderate pain.   acetaminophen (TYLENOL) 500 MG tablet Take 2 tablets (1,000 mg total) by mouth in the morning.   Cranberry (THERACRAN PO) Take 250 mg by mouth in the morning and at bedtime.   D-Mannose POWD Take 4 capsules by mouth daily. 99%   docusate sodium (COLACE) 100 MG capsule Take 100 mg by mouth 2 (two) times daily as needed for mild constipation.   estradiol (ESTRING) 2 MG vaginal ring Place 2 mg vaginally every 3 (three) months. follow package directions   hydrALAZINE (APRESOLINE) 10 MG tablet Take 10 mg by mouth 3 (three) times daily as needed.   ibuprofen (ADVIL) 200 MG  tablet Take 400 mg by mouth 3 (three) times daily as needed for mild pain.   losartan (COZAAR) 25 MG tablet Take 12.5 mg by mouth daily. Hold is SBP < 100 and inform provider if B/P greater than 160/90   nitrofurantoin (MACRODANTIN) 50 MG capsule Take 50 mg by mouth 2 (two) times daily.   senna (SENOKOT) 8.6 MG TABS tablet Take 1 tablet by mouth in the morning and at bedtime.   tamsulosin (FLOMAX) 0.4 MG CAPS capsule Take 0.4 mg by mouth at bedtime.   No facility-administered encounter medications on file as of 12/27/2021.    Review of Systems  Constitutional:  Negative for activity change, appetite change, chills,  diaphoresis, fatigue, fever and unexpected weight change.  HENT:  Negative for congestion.   Respiratory:  Negative for cough, shortness of breath and wheezing.   Cardiovascular:  Negative for chest pain, palpitations and leg swelling.  Gastrointestinal:  Positive for anal bleeding. Negative for abdominal distention, abdominal pain, blood in stool, constipation, diarrhea, nausea and rectal pain.  Genitourinary:  Negative for difficulty urinating and dysuria.  Musculoskeletal:  Positive for gait problem. Negative for arthralgias, back pain, joint swelling and myalgias.  Neurological:  Negative for dizziness, tremors, seizures, syncope, facial asymmetry, speech difficulty, weakness, light-headedness, numbness and headaches.  Psychiatric/Behavioral:  Positive for confusion. Negative for agitation and behavioral problems.    Immunization History  Administered Date(s) Administered   Influenza, High Dose Seasonal PF 07/25/2021   Influenza-Unspecified 08/05/2018, 09/29/2018, 07/02/2019, 08/21/2019, 08/11/2020   Moderna Covid-19 Vaccine Bivalent Booster 67yrs & up 08/01/2021   Moderna SARS-COV2 Booster Vaccination 08/31/2020, 06/12/2021   Moderna Sars-Covid-2 Vaccination 11/04/2019, 12/02/2019   Pneumococcal Conjugate-13 09/28/2018   Pneumococcal Polysaccharide-23 10/21/2017   Tdap 02/23/2021   Zoster Recombinat (Shingrix) 05/24/2021   Pertinent  Health Maintenance Due  Topic Date Due   INFLUENZA VACCINE  Completed   DEXA SCAN  Discontinued   Fall Risk 08/03/2021 08/04/2021 08/04/2021 08/05/2021 10/19/2021  Falls in the past year? - - - - 0  Was there an injury with Fall? - - - - 0  Fall Risk Category Calculator - - - - 0  Fall Risk Category - - - - Low  Patient Fall Risk Level High fall risk High fall risk High fall risk High fall risk Moderate fall risk  Patient at Risk for Falls Due to - - - - Impaired balance/gait  Fall risk Follow up - - - - Falls evaluation completed   Functional  Status Survey:    Vitals:   12/27/21 1502  BP: 129/75  Pulse: 74  Resp: (!) 21  Temp: (!) 97.3 F (36.3 C)  SpO2: 94%   There is no height or weight on file to calculate BMI. Physical Exam Vitals and nursing note reviewed.  Constitutional:      General: She is not in acute distress.    Appearance: She is not diaphoretic.  HENT:     Head: Normocephalic and atraumatic.  Neck:     Vascular: No JVD.  Cardiovascular:     Rate and Rhythm: Normal rate and regular rhythm.     Heart sounds: No murmur heard. Pulmonary:     Effort: Pulmonary effort is normal. No respiratory distress.     Breath sounds: Normal breath sounds. No wheezing.  Abdominal:     General: Bowel sounds are normal. There is no distension.     Palpations: Abdomen is soft.     Tenderness: There is no abdominal tenderness. There  is no right CVA tenderness or left CVA tenderness.  Genitourinary:    Rectum: External hemorrhoid present. No mass, tenderness, anal fissure or internal hemorrhoid. Normal anal tone.  Skin:    General: Skin is warm and dry.  Neurological:     Mental Status: She is alert and oriented to person, place, and time.  Psychiatric:        Mood and Affect: Mood normal.    Labs reviewed: Recent Labs    08/02/21 1926 08/03/21 0511 08/14/21 0000  NA 135 135 140  K 3.8 3.6 4.2  CL 102 101 106  CO2 24 25 25*  GLUCOSE 107* 91  --   BUN 8 8 11   CREATININE 0.77 0.73 0.6  CALCIUM 9.2 8.3* 9.1   Recent Labs    06/28/21 0000 08/02/21 1926  AST 17 26  ALT 12 18  ALKPHOS 98 80  BILITOT  --  0.9  PROT  --  7.0  ALBUMIN 3.9 3.5   Recent Labs    05/17/21 0000 08/02/21 1926 08/03/21 0511  WBC 7.1 5.4 4.8  NEUTROABS  --  4.0  --   HGB 14.2 14.3 12.5  HCT 43 45.7 38.4  MCV  --  90.0 88.5  PLT 186 169 151   Lab Results  Component Value Date   TSH 1.13 07/20/2020   No results found for: HGBA1C Lab Results  Component Value Date   CHOL 235 (A) 07/20/2020   HDL 100 (A) 07/20/2020    LDLCALC 134 07/20/2020   TRIG 102 07/20/2020    Significant Diagnostic Results in last 30 days:  No results found.  Assessment/Plan 1. External hemorrhoid Begin hemorrhoid hydrocortisone cream qhs internal and external x 5 days  2. Slow transit constipation Bowels are moving regularly Continue senokot bid and colace prn    Family/ staff Communication: nurse  Labs/tests ordered:  NA

## 2022-02-05 ENCOUNTER — Non-Acute Institutional Stay (SKILLED_NURSING_FACILITY): Payer: Medicare Other | Admitting: Orthopedic Surgery

## 2022-02-05 ENCOUNTER — Encounter: Payer: Self-pay | Admitting: Orthopedic Surgery

## 2022-02-05 DIAGNOSIS — R4781 Slurred speech: Secondary | ICD-10-CM

## 2022-02-05 DIAGNOSIS — I10 Essential (primary) hypertension: Secondary | ICD-10-CM | POA: Diagnosis not present

## 2022-02-05 DIAGNOSIS — R400 Somnolence: Secondary | ICD-10-CM | POA: Diagnosis not present

## 2022-02-05 DIAGNOSIS — R220 Localized swelling, mass and lump, head: Secondary | ICD-10-CM | POA: Diagnosis not present

## 2022-02-05 LAB — CBC AND DIFFERENTIAL
HCT: 40 (ref 36–46)
Hemoglobin: 13.2 (ref 12.0–16.0)
Neutrophils Absolute: 3.5
Platelets: 197 10*3/uL (ref 150–400)
WBC: 6.5

## 2022-02-05 LAB — BASIC METABOLIC PANEL
BUN: 13 (ref 4–21)
CO2: 28 — AB (ref 13–22)
Chloride: 106 (ref 99–108)
Creatinine: 0.7 (ref 0.5–1.1)
Glucose: 91
Potassium: 4 mEq/L (ref 3.5–5.1)
Sodium: 140 (ref 137–147)

## 2022-02-05 LAB — COMPREHENSIVE METABOLIC PANEL
Albumin: 3.7 (ref 3.5–5.0)
Calcium: 9.2 (ref 8.7–10.7)
eGFR: 84

## 2022-02-05 LAB — CBC: RBC: 4.64 (ref 3.87–5.11)

## 2022-02-05 LAB — HEPATIC FUNCTION PANEL
ALT: 13 U/L (ref 7–35)
AST: 15 (ref 13–35)
Alkaline Phosphatase: 80 (ref 25–125)
Bilirubin, Total: 0.7

## 2022-02-05 NOTE — Progress Notes (Signed)
?Location:  [facility] ?Nursing Home Room Number: 140/A ?Place of Service:  SNF (31) ?Provider: Octavia Heir, NP ? ?Patient Care Team: ?Cheryl Gammon, MD as PCP - General (Internal Medicine) ?Jamison Neighbor, MD (Urology) ? ?Extended Emergency Contact Information ?Primary Emergency Contact: CherylRajiv ?Address: [address] ?         [place], [place] [address] [place] ?Home Phone: [phone] ?Work Phone: [phone] ?Relation: Son ?Secondary Emergency Contact: Alvino Blood ? [place] ?Home Phone: [phone] ?Work Phone: [phone] ?Mobile Phone: [phone] ?Relation: Son ? ?Code Status:  DNR ?Goals of care: Advanced Directive information ? ?  02/05/2022  ?  2:39 PM  ?Advanced Directives  ?Does Patient Have a Medical Advance Directive? Yes  ?Type of Estate agent of [place];Living will;Out of facility DNR (pink MOST or yellow form)  ?Does patient want to make changes to medical advance directive? No - Patient declined  ?Copy of Healthcare Power of Attorney in Chart? Yes - validated most recent copy scanned in chart (See row information)  ? ? ? ?Chief Complaint  ?Patient presents with  ? Acute Visit  ?  Slurred speech  ? ? ?HPI:  ?Pt is a 87 y.o. female seen today for an acute visit for slurred speech.  ? ?She currently resides on the skilled nursing unit at [facility] due to Alzheimer's dementia. Past medical history includes: hypertension, recurrent UTI, constipation, weakness and spinal stenosis. ?  ?Nursing reports slurred speech and increased somnolence. BP also noted to be 190/104. She was given hydralazine 10 mg po once. She was given losartan during morning med pass. During our encounter she was able to answer appropriately and follow commands. She kept her eyes closed most of time. She was able to drink a few sips of water without difficulty. Blood pressure rechecked and decreased to 136/94.  ? ?She was  hospitalized 10/13- 10/16 due to change in mental status, blood pressure was also noted to be elevated. CT head negative for acute changes, noted hypodensities throughout the periventricular and subcortical ?white matter, most consistent with chronic small vessel ischemic ?change and chronic left cerebellar infarct.  ? ?Past Medical History:  ?Diagnosis Date  ? Alzheimer disease (HCC)   ? Balance problem 07/19/2020  ? Constipation 07/19/2020  ? Dementia without behavioral disturbance (HCC) 07/19/2020  ? MMSE 21/30 07/21/20  ? Fall   ? Osteoarthritis   ? Pyelonephritis   ? Spinal stenosis 07/19/2020  ? Weakness of left lower extremity 07/19/2020  ? ?History reviewed. No pertinent surgical history. ? ?Allergies  ?Allergen Reactions  ? Sulfa Antibiotics Rash  ?  Rash to trunk, legs, neck, scalp, and arms  ? ? ?Outpatient Encounter Medications as of 02/05/2022  ?Medication Sig  ? acetaminophen (TYLENOL) 500 MG tablet Take 1,000 mg by mouth 3 (three) times daily.  ? acetaminophen (TYLENOL) 500 MG tablet Take 2 tablets (1,000 mg total) by mouth in the morning.  ? Cranberry (THERACRAN PO) Take 250 mg by mouth in the morning and at bedtime.  ? D-Mannose POWD Take 4 capsules by mouth daily. 99%  ? docusate sodium (COLACE) 100 MG capsule Take 100 mg by mouth 2 (two) times daily as needed for mild constipation.  ? Emollient (CERAVE) LOTN Apply topically daily. Apply Cerave lotion to back and extremities  ? estradiol (ESTRING) 2 MG vaginal ring Place 2 mg vaginally every 3 (three) months. follow package directions  ? hydrALAZINE (APRESOLINE) 10 MG  tablet Take 10 mg by mouth every 8 (eight) hours as needed (Pain).  ? losartan (COZAAR) 25 MG tablet Take 12.5 mg by mouth daily. Hold is SBP < 100 and inform provider if B/P greater than 160/90  ? nitrofurantoin (MACRODANTIN) 50 MG capsule Take 50 mg by mouth 2 (two) times daily.  ? senna (SENOKOT) 8.6 MG TABS tablet Take 1 tablet by mouth in the morning and at bedtime.  ? tamsulosin  (FLOMAX) 0.4 MG CAPS capsule Take 0.4 mg by mouth at bedtime.  ? ibuprofen (ADVIL) 200 MG tablet Take 400 mg by mouth 3 (three) times daily as needed for mild pain.  ? ?No facility-administered encounter medications on file as of 02/05/2022.  ? ? ?Review of Systems  ?Unable to perform ROS: Dementia  ? ?Immunization History  ?Administered Date(s) Administered  ? Influenza, High Dose Seasonal PF 07/25/2021  ? Influenza-Unspecified 08/05/2018, 09/29/2018, 07/02/2019, 08/21/2019, 08/11/2020  ? Moderna Covid-19 Vaccine Bivalent Booster 8110yrs & up 08/01/2021  ? Moderna SARS-COV2 Booster Vaccination 08/31/2020, 06/12/2021  ? Moderna Sars-Covid-2 Vaccination 11/04/2019, 12/02/2019  ? Pneumococcal Conjugate-13 09/28/2018  ? Pneumococcal Polysaccharide-23 10/21/2017  ? Tdap 02/23/2021  ? Zoster Recombinat (Shingrix) 05/24/2021  ? ?Pertinent  Health Maintenance Due  ?Topic Date Due  ? INFLUENZA VACCINE  05/21/2022  ? DEXA SCAN  Discontinued  ? ? ?  08/03/2021  ?  6:00 PM 08/04/2021  ? 12:00 AM 08/04/2021  ? 10:39 AM 08/05/2021  ?  1:00 AM 10/19/2021  ?  1:02 PM  ?Fall Risk  ?Falls in the past year?     0  ?Was there an injury with Fall?     0  ?Fall Risk Category Calculator     0  ?Fall Risk Category     Low  ?Patient Fall Risk Level High fall risk High fall risk High fall risk High fall risk Moderate fall risk  ?Patient at Risk for Falls Due to     Impaired balance/gait  ?Fall risk Follow up     Falls evaluation completed  ? ?Functional Status Survey: ?  ? ?Vitals:  ? 02/05/22 1433  ?BP: 126/84  ?Pulse: 70  ?Resp: 18  ?Temp: (!) 97.3 ?F (36.3 ?C)  ?SpO2: 97%  ?Weight: 148 lb 9.6 oz (67.4 kg)  ?Height: 5\' 2"  (1.575 m)  ? ?Body mass index is 27.18 kg/m?Marland Kitchen. ?Physical Exam ?Vitals reviewed. Nursing note reviewed: eyes closed most of encounter, opens on command, speech clear. ?Constitutional:   ?   Appearance: Normal appearance.  ?HENT:  ?   Head:  ?   Comments: Slight swelling to left side of face, non tender, cool to touch, no skin  breakdown ?   Mouth/Throat:  ?   Comments: Smile symmetrical, no facial droop ?Eyes:  ?   General:     ?   Right eye: No discharge.     ?   Left eye: No discharge.  ?   Extraocular Movements: Extraocular movements intact.  ?   Pupils: Pupils are equal, round, and reactive to light.  ?Cardiovascular:  ?   Rate and Rhythm: Normal rate and regular rhythm.  ?   Pulses: Normal pulses.  ?   Heart sounds: Normal heart sounds.  ?Pulmonary:  ?   Effort: Pulmonary effort is normal. No respiratory distress.  ?   Breath sounds: Normal breath sounds. No wheezing.  ?Abdominal:  ?   General: Bowel sounds are normal. There is no distension.  ?   Palpations: Abdomen is  soft.  ?   Tenderness: There is no abdominal tenderness.  ?Musculoskeletal:  ?   Cervical back: Neck supple.  ?   Right lower leg: No edema.  ?   Left lower leg: No edema.  ?Skin: ?   General: Skin is warm and dry.  ?   Capillary Refill: Capillary refill takes less than 2 seconds.  ?Neurological:  ?   General: No focal deficit present.  ?   Mental Status: She is easily aroused. Mental status is at baseline.  ?   Motor: Weakness present.  ?   Gait: Gait abnormal.  ?   Comments: Walker, hand grips equal 5/5  ?Psychiatric:     ?   Cognition and Memory: Memory is impaired.  ?   Comments: Follows commands, able to express needs  ? ? ?Labs reviewed: ?Recent Labs  ?  08/02/21 ?1926 08/03/21 ?3086 08/14/21 ?0000  ?NA 135 135 140  ?K 3.8 3.6 4.2  ?CL 102 101 106  ?CO2 24 25 25*  ?GLUCOSE 107* 91  --   ?BUN 8 8 11   ?CREATININE 0.77 0.73 0.6  ?CALCIUM 9.2 8.3* 9.1  ? ?Recent Labs  ?  06/28/21 ?0000 08/02/21 ?1926  ?AST 17 26  ?ALT 12 18  ?ALKPHOS 98 80  ?BILITOT  --  0.9  ?PROT  --  7.0  ?ALBUMIN 3.9 3.5  ? ?Recent Labs  ?  05/17/21 ?0000 08/02/21 ?1926 08/03/21 ?08/05/21  ?WBC 7.1 5.4 4.8  ?NEUTROABS  --  4.0  --   ?HGB 14.2 14.3 12.5  ?HCT 43 45.7 38.4  ?MCV  --  90.0 88.5  ?PLT 186 169 151  ? ?Lab Results  ?Component Value Date  ? TSH 1.13 07/20/2020  ? ?No results found for:  HGBA1C ?Lab Results  ?Component Value Date  ? CHOL 235 (A) 07/20/2020  ? HDL 100 (A) 07/20/2020  ? LDLCALC 134 07/20/2020  ? TRIG 102 07/20/2020  ? ? ?Significant Diagnostic Results in last 30 days:  ?No results fou

## 2022-02-11 ENCOUNTER — Encounter: Payer: Self-pay | Admitting: Internal Medicine

## 2022-02-11 ENCOUNTER — Non-Acute Institutional Stay (SKILLED_NURSING_FACILITY): Payer: Medicare Other | Admitting: Internal Medicine

## 2022-02-11 DIAGNOSIS — F0280 Dementia in other diseases classified elsewhere without behavioral disturbance: Secondary | ICD-10-CM

## 2022-02-11 DIAGNOSIS — G301 Alzheimer's disease with late onset: Secondary | ICD-10-CM | POA: Diagnosis not present

## 2022-02-11 DIAGNOSIS — R42 Dizziness and giddiness: Secondary | ICD-10-CM | POA: Diagnosis not present

## 2022-02-11 DIAGNOSIS — I10 Essential (primary) hypertension: Secondary | ICD-10-CM

## 2022-02-11 DIAGNOSIS — N319 Neuromuscular dysfunction of bladder, unspecified: Secondary | ICD-10-CM

## 2022-02-11 DIAGNOSIS — N390 Urinary tract infection, site not specified: Secondary | ICD-10-CM | POA: Diagnosis not present

## 2022-02-11 NOTE — Progress Notes (Signed)
?Location:   [facility] ?Nursing Home Room Number: 140 ?Place of Service:  SNF (31) ?Provider:  Einar Crow MD ? ?Mahlon Gammon, MD ? ?Patient Care Team: ?Mahlon Gammon, MD as PCP - General (Internal Medicine) ?Jamison Neighbor, MD (Urology) ? ?Extended Emergency Contact Information ?Primary Emergency Contact: Gowans,[name] ?Address: [address] ?         [place], [place] [address] [place] ?Home Phone: [phone] ?Work Phone: [phone] ?Relation: Son ?Secondary Emergency Contact: Alvino Blood ? [place] ?Home Phone: [phone] ?Work Phone: [phone] ?Mobile Phone: [phone] ?Relation: Son ? ?Code Status:  DNR ?Goals of care: Advanced Directive information ? ?  02/11/2022  ? 11:04 AM  ?Advanced Directives  ?Does Patient Have a Medical Advance Directive? Yes  ?Type of Estate agent of [place];Living will;Out of facility DNR (pink MOST or yellow form)  ?Does patient want to make changes to medical advance directive? No - Patient declined  ?Copy of Healthcare Power of Attorney in Chart? Yes - validated most recent copy scanned in chart (See row information)  ? ? ? ?Chief Complaint  ?Patient presents with  ? Medical Management of Chronic Issues  ? Quality Metric Gaps  ?  Verified [facility] and [facility] patient is due for shingrix.  ? ? ?HPI:  ?Pt is a 87 y.o. female seen today for medical management of chronic diseases.   ? ?Patient has a history of Alzheimer's dementia with Aphasia, hypertension, orthostatic hypotension ?She also has history of recurrent UTIs and urinary incontinence ? ?Patient continues to have issues with Dizziness and Fluctuating BP ?Her BP can vary from 200 systolic to 90 systolic with Dizziness ? ?Past few days nursing have been giving her PRN hydralazine ion regular basis ?Also got one dose of Clonidine ? ?Patient seen in her room with the Nurse ?C/o Feeling weak and Tired ?Also Feeling Dizzy BP  checked twice and SBP was 195 ?Given 10 mg of Hydralazine ?No Chest pain Fever or Dysuria ?She also Did not eat when she was feeling Dizzy ?No Falls ?Walk with her walker ?Wt Readings from Last 3 Encounters:  ?02/11/22 148 lb 9.6 oz (67.4 kg)  ?02/05/22 148 lb 9.6 oz (67.4 kg)  ?12/25/21 148 lb 9.6 oz (67.4 kg)  ?  ?Past Medical History:  ?Diagnosis Date  ? Alzheimer disease (HCC)   ? Balance problem 07/19/2020  ? Constipation 07/19/2020  ? Dementia without behavioral disturbance (HCC) 07/19/2020  ? MMSE 21/30 07/21/20  ? Fall   ? Osteoarthritis   ? Pyelonephritis   ? Spinal stenosis 07/19/2020  ? Weakness of left lower extremity 07/19/2020  ? ?History reviewed. No pertinent surgical history. ? ?Allergies  ?Allergen Reactions  ? Sulfa Antibiotics Rash  ?  Rash to trunk, legs, neck, scalp, and arms  ? ? ?Allergies as of 02/11/2022   ? ?   Reactions  ? Sulfa Antibiotics Rash  ? Rash to trunk, legs, neck, scalp, and arms  ? ?  ? ?  ?Medication List  ?  ? ?  ? Accurate as of February 11, 2022 11:04 AM. If you have any questions, ask your nurse or doctor.  ?  ?  ? ?  ? ?acetaminophen 500 MG tablet ?Commonly known as: TYLENOL ?Take 1,000 mg by mouth 3 (three) times daily. ?  ?acetaminophen 500 MG tablet ?Commonly known as: TYLENOL ?Take 2 tablets (1,000 mg total) by mouth in the morning. ?  ?CeraVe  Lotn ?Apply topically daily. Apply Cerave lotion to back and extremities ?  ?D-Mannose Powd ?Take 4 capsules by mouth daily. 99% ?  ?docusate sodium 100 MG capsule ?Commonly known as: COLACE ?Take 100 mg by mouth 2 (two) times daily as needed for mild constipation. ?  ?estradiol 2 MG vaginal ring ?Commonly known as: ESTRING ?Place 2 mg vaginally every 3 (three) months. follow package directions ?  ?hydrALAZINE 10 MG tablet ?Commonly known as: APRESOLINE ?Take 10 mg by mouth every 8 (eight) hours as needed (Pain). ?  ?ibuprofen 200 MG tablet ?Commonly known as: ADVIL ?Take 400 mg by mouth 3 (three) times daily as needed for mild pain. ?   ?losartan 25 MG tablet ?Commonly known as: COZAAR ?Take 12.5 mg by mouth daily. Hold is SBP < 100 and inform provider if B/P greater than 160/90 ?  ?nitrofurantoin 50 MG capsule ?Commonly known as: MACRODANTIN ?Take 50 mg by mouth 2 (two) times daily. ?  ?senna 8.6 MG Tabs tablet ?Commonly known as: SENOKOT ?Take 1 tablet by mouth in the morning and at bedtime. ?  ?tamsulosin 0.4 MG Caps capsule ?Commonly known as: FLOMAX ?Take 0.4 mg by mouth at bedtime. ?  ?THERACRAN PO ?Take 250 mg by mouth in the morning and at bedtime. ?  ? ?  ? ? ?Review of Systems  ?Constitutional:  Positive for activity change and appetite change.  ?HENT: Negative.    ?Respiratory:  Negative for cough and shortness of breath.   ?Cardiovascular:  Negative for leg swelling.  ?Gastrointestinal:  Negative for constipation.  ?Genitourinary: Negative.   ?Musculoskeletal:  Positive for gait problem. Negative for arthralgias and myalgias.  ?Skin: Negative.   ?Neurological:  Positive for dizziness, weakness and light-headedness.  ?Psychiatric/Behavioral:  Negative for confusion, dysphoric mood and sleep disturbance.   ? ?Immunization History  ?Administered Date(s) Administered  ? Influenza, High Dose Seasonal PF 07/25/2021  ? Influenza-Unspecified 08/05/2018, 09/29/2018, 07/02/2019, 08/21/2019, 08/11/2020  ? Moderna Covid-19 Vaccine Bivalent Booster 24yrs & up 08/01/2021  ? Moderna SARS-COV2 Booster Vaccination 08/31/2020, 06/12/2021  ? Moderna Sars-Covid-2 Vaccination 11/04/2019, 12/02/2019  ? Pneumococcal Conjugate-13 09/28/2018  ? Pneumococcal Polysaccharide-23 10/21/2017  ? Tdap 02/23/2021  ? Zoster Recombinat (Shingrix) 05/24/2021  ? ?Pertinent  Health Maintenance Due  ?Topic Date Due  ? INFLUENZA VACCINE  05/21/2022  ? DEXA SCAN  Discontinued  ? ? ?  08/03/2021  ?  6:00 PM 08/04/2021  ? 12:00 AM 08/04/2021  ? 10:39 AM 08/05/2021  ?  1:00 AM 10/19/2021  ?  1:02 PM  ?Fall Risk  ?Falls in the past year?     0  ?Was there an injury with Fall?      0  ?Fall Risk Category Calculator     0  ?Fall Risk Category     Low  ?Patient Fall Risk Level High fall risk High fall risk High fall risk High fall risk Moderate fall risk  ?Patient at Risk for Falls Due to     Impaired balance/gait  ?Fall risk Follow up     Falls evaluation completed  ? ?Functional Status Survey: ?  ? ?Vitals:  ? 02/11/22 1056  ?BP: (!) 150/80  ?Pulse: 66  ?Resp: 18  ?Temp: 97.6 ?F (36.4 ?C)  ?SpO2: 95%  ?Weight: 148 lb 9.6 oz (67.4 kg)  ?Height: 5\' 2"  (1.575 m)  ? ?Body mass index is 27.18 kg/m? ?Physical Exam ?Vitals reviewed.  ?Constitutional:   ?   Appearance: Normal appearance.  ?HENT:  ?  Head: Normocephalic.  ?   Nose: Nose normal.  ?   Mouth/Throat:  ?   Mouth: Mucous membranes are moist.  ?   Pharynx: Oropharynx is clear.  ?Eyes:  ?   Pupils: Pupils are equal, round, and reactive to light.  ?Cardiovascular:  ?   Rate and Rhythm: Normal rate and regular rhythm.  ?   Pulses: Normal pulses.  ?   Heart sounds: Normal heart sounds. No murmur heard. ?Pulmonary:  ?   Effort: Pulmonary effort is normal.  ?   Breath sounds: Normal breath sounds.  ?Abdominal:  ?   General: Abdomen is flat. Bowel sounds are normal.  ?   Palpations: Abdomen is soft.  ?Musculoskeletal:     ?   General: No swelling.  ?   Cervical back: Neck supple.  ?Skin: ?   General: Skin is warm.  ?Neurological:  ?   General: No focal deficit present.  ?   Mental Status: She is alert.  ?   Comments: Has APhasia  ?Psychiatric:     ?   Mood and Affect: Mood normal.     ?   Thought Content: Thought content normal.  ? ? ?Labs reviewed: ?Recent Labs  ?  08/02/21 ?1926 08/03/21 ?[id number] 08/14/21 ?0000  ?NA 135 135 140  ?K 3.8 3.6 4.2  ?CL 102 101 106  ?CO2 24 25 25*  ?GLUCOSE 107* 91  --   ?BUN 8 8 11   ?CREATININE 0.77 0.73 0.6  ?CALCIUM 9.2 8.3* 9.1  ? ?Recent Labs  ?  06/28/21 ?0000 08/02/21 ?1926  ?AST 17 26  ?ALT 12 18  ?ALKPHOS 98 80  ?BILITOT  --  0.9  ?PROT  --  7.0  ?ALBUMIN 3.9 3.5  ? ?Recent Labs  ?  05/17/21 ?0000  08/02/21 ?1926 08/03/21 ?[id number]  ?WBC 7.1 5.4 4.8  ?NEUTROABS  --  4.0  --   ?HGB 14.2 14.3 12.5  ?HCT 43 45.7 38.4  ?MCV  --  90.0 88.5  ?PLT 186 169 151  ? ?Lab Results  ?Component Value Date  ? TSH 1.13 07/20/2020  ?

## 2022-03-05 ENCOUNTER — Encounter: Payer: Self-pay | Admitting: Orthopedic Surgery

## 2022-03-05 ENCOUNTER — Non-Acute Institutional Stay (SKILLED_NURSING_FACILITY): Payer: Medicare Other | Admitting: Orthopedic Surgery

## 2022-03-05 DIAGNOSIS — G301 Alzheimer's disease with late onset: Secondary | ICD-10-CM

## 2022-03-05 DIAGNOSIS — F0280 Dementia in other diseases classified elsewhere without behavioral disturbance: Secondary | ICD-10-CM

## 2022-03-05 DIAGNOSIS — K5901 Slow transit constipation: Secondary | ICD-10-CM

## 2022-03-05 DIAGNOSIS — N3941 Urge incontinence: Secondary | ICD-10-CM

## 2022-03-05 DIAGNOSIS — R42 Dizziness and giddiness: Secondary | ICD-10-CM

## 2022-03-05 DIAGNOSIS — I10 Essential (primary) hypertension: Secondary | ICD-10-CM | POA: Diagnosis not present

## 2022-03-05 DIAGNOSIS — N319 Neuromuscular dysfunction of bladder, unspecified: Secondary | ICD-10-CM

## 2022-03-05 DIAGNOSIS — N390 Urinary tract infection, site not specified: Secondary | ICD-10-CM

## 2022-03-05 NOTE — Progress Notes (Signed)
?Location:  [facility] ?Nursing Home Room Number: 140/A ?Place of Service:  SNF (31) ?Provider:  Octavia HeirAmy E Ziyon Soltau, NP ? ? ?Patient Care Team: ?Mahlon GammonGupta, Anjali L, MD as PCP - General (Internal Medicine) ?Jamison NeighborEvans, Robert J, MD (Urology) ? ?Extended Emergency Contact Information ?Primary Emergency Contact: Rozycki,[name] ?Address: [address] ?         [place], [place] [address] [place] ?Home Phone: [phone] ?Work Phone: [phone] ?Relation: Son ?Secondary Emergency Contact: Alvino BloodShah, Sanjiv ? [place] ?Home Phone: [phone] ?Work Phone: [phone] ?Mobile Phone: [phone] ?Relation: Son ? ?Code Status:  DNR ?Goals of care: Advanced Directive information ? ?  03/05/2022  ? 11:05 AM  ?Advanced Directives  ?Does Patient Have a Medical Advance Directive? Yes  ?Type of Estate agentAdvance Directive Healthcare Power of [place];Living will;Out of facility DNR (pink MOST or yellow form)  ?Does patient want to make changes to medical advance directive? No - Patient declined  ?Copy of Healthcare Power of Attorney in Chart? Yes - validated most recent copy scanned in chart (See row information)  ? ? ? ?Chief Complaint  ?Patient presents with  ? Medical Management of Chronic Issues  ?  Routine visit.   ? Quality Metric Gaps  ?  Discuss the need for Shingrix vaccine, or post pone if patient refuses.  ? ? ?HPI:  ?Pt is a 87 y.o. female seen today for medical management of chronic diseases.  ? ?[facility] due to Alzheimer's dementia. Past medical history includes: hypertension, recurrent UTI, constipation, weakness and spinal stenosis. ? ?HTN- BUN/creat 13/0.7 02/05/2022, see trends below, started on hydralazine 10 mg qhs last month due to elevated pressures, remains on losartan qAM and hydralazine prn for SBP> 180 ?Alzheimer's- MMSE 11/30, MRI brain noted moderate chronic ischemic changes in cerebral white matter, no recent  behavioral outbursts, aphasia, ambulates with walker- shuffling gait, continues to eat in dinning room and attend bingo at times ?Dizziness- hgb 13.2 02/05/2022, becomes dizzy easily, no recent episodes per nursing ?Urge incontinence- remains on Flomax ?Neurogenic bladder- remains on Flomax ?Recurrent UTI- remains on nitrofurantoin ?Constipation- LBM 05/16, remains on senna and colace ? ?No recent falls or injuries.  ? ?Recent blood pressures: ? 05/15- 162/86 ? 05/14- 114/66 ? 05/10- 155/81 ? 05/09- 177/82 ? ?Recent weights: ? 05/01- 148.4 lbs ? 04/01- 152 lbs ? 03/01- 148.6 lbs ? ?  ? ? ?Past Medical History:  ?Diagnosis Date  ? Alzheimer disease (HCC)   ? Balance problem 07/19/2020  ? Constipation 07/19/2020  ? Dementia without behavioral disturbance (HCC) 07/19/2020  ? MMSE 21/30 07/21/20  ? Fall   ? Osteoarthritis   ? Pyelonephritis   ? Spinal stenosis 07/19/2020  ? Weakness of left lower extremity 07/19/2020  ? ?History reviewed. No pertinent surgical history. ? ?Allergies  ?Allergen Reactions  ? Sulfa Antibiotics Rash  ?  Rash to trunk, legs, neck, scalp, and arms  ? ? ?Outpatient Encounter Medications as of 03/05/2022  ?Medication Sig  ? acetaminophen (TYLENOL) 500 MG tablet Take 1,000 mg by mouth 3 (three) times daily.  ? acetaminophen (TYLENOL) 500 MG tablet Take 2 tablets (1,000 mg total) by mouth in the morning.  ? Cranberry (THERACRAN PO) Take 250 mg by mouth in the morning and at bedtime.  ? D-Mannose POWD Take 4 capsules by mouth daily. 99%  ? docusate sodium (COLACE) 100 MG capsule Take 100 mg by mouth 2 (two) times  daily as needed for mild constipation.  ? Emollient (CERAVE) LOTN Apply topically daily. Apply Cerave lotion to back and extremities  ? estradiol (ESTRING) 2 MG vaginal ring Place 2 mg vaginally every 3 (three) months. follow package directions  ? hydrALAZINE (APRESOLINE) 10 MG tablet Take 10 mg by mouth every 8 (eight) hours as needed (Pain).  ? hydrALAZINE (APRESOLINE) 10 MG tablet Take 10 mg  by mouth at bedtime.  ? ibuprofen (ADVIL) 200 MG tablet Take 400 mg by mouth 3 (three) times daily as needed for mild pain.  ? losartan (COZAAR) 25 MG tablet Take 12.5 mg by mouth daily. Hold is SBP < 100 and inform provider if B/P greater than 160/90  ? nitrofurantoin (MACRODANTIN) 50 MG capsule Take 50 mg by mouth 2 (two) times daily.  ? senna (SENOKOT) 8.6 MG TABS tablet Take 1 tablet by mouth in the morning and at bedtime.  ? tamsulosin (FLOMAX) 0.4 MG CAPS capsule Take 0.4 mg by mouth at bedtime.  ? ?No facility-administered encounter medications on file as of 03/05/2022.  ? ? ?Review of Systems  ?Unable to perform ROS: Dementia  ? ?Immunization History  ?Administered Date(s) Administered  ? Influenza, High Dose Seasonal PF 07/25/2021  ? Influenza-Unspecified 08/05/2018, 09/29/2018, 07/02/2019, 08/21/2019, 08/11/2020  ? Moderna Covid-19 Vaccine Bivalent Booster 43yrs & up 08/01/2021  ? Moderna SARS-COV2 Booster Vaccination 08/31/2020, 06/12/2021  ? Moderna Sars-Covid-2 Vaccination 11/04/2019, 12/02/2019  ? Pneumococcal Conjugate-13 09/28/2018  ? Pneumococcal Polysaccharide-23 10/21/2017  ? Tdap 02/23/2021  ? Zoster Recombinat (Shingrix) 05/24/2021  ? ?Pertinent  Health Maintenance Due  ?Topic Date Due  ? INFLUENZA VACCINE  05/21/2022  ? DEXA SCAN  Discontinued  ? ? ?  08/03/2021  ?  6:00 PM 08/04/2021  ? 12:00 AM 08/04/2021  ? 10:39 AM 08/05/2021  ?  1:00 AM 10/19/2021  ?  1:02 PM  ?Fall Risk  ?Falls in the past year?     0  ?Was there an injury with Fall?     0  ?Fall Risk Category Calculator     0  ?Fall Risk Category     Low  ?Patient Fall Risk Level High fall risk High fall risk High fall risk High fall risk Moderate fall risk  ?Patient at Risk for Falls Due to     Impaired balance/gait  ?Fall risk Follow up     Falls evaluation completed  ? ?Functional Status Survey: ?  ? ?Vitals:  ? 03/05/22 1058  ?BP: (!) 162/86  ?Pulse: 60  ?Resp: 18  ?Temp: (!) 97.2 ?F (36.2 ?C)  ?SpO2: 96%  ?Weight: 148 lb 6.4 oz (67.3  kg)  ?Height: 5\' 2"  (1.575 m)  ? ?Body mass index is 27.14 kg/m? ?Physical Exam ?Vitals reviewed.  ?Constitutional:   ?   General: She is not in acute distress. ?HENT:  ?   Head: Normocephalic.  ?Eyes:  ?   General:     ?   Right eye: No discharge.     ?   Left eye: No discharge.  ?Cardiovascular:  ?   Rate and Rhythm: Normal rate and regular rhythm.  ?   Pulses: Normal pulses.  ?   Heart sounds: Normal heart sounds.  ?Pulmonary:  ?   Effort: Pulmonary effort is normal. No respiratory distress.  ?   Breath sounds: Normal breath sounds. No wheezing.  ?Abdominal:  ?   General: Bowel sounds are normal. There is no distension.  ?   Palpations: Abdomen is soft.  ?  Tenderness: There is no abdominal tenderness.  ?Musculoskeletal:  ?   Cervical back: Neck supple.  ?   Right lower leg: No edema.  ?   Left lower leg: No edema.  ?Skin: ?   General: Skin is warm and dry.  ?   Capillary Refill: Capillary refill takes less than 2 seconds.  ?Neurological:  ?   General: No focal deficit present.  ?   Mental Status: She is alert. Mental status is at baseline.  ?   Motor: Weakness present.  ?   Gait: Gait abnormal.  ?   Comments: Shuffling gait, walker  ?Psychiatric:     ?   Mood and Affect: Mood normal.     ?   Behavior: Behavior normal.  ?   Comments: Aphasia, follows some commands, alert to self and familiar face  ? ? ?Labs reviewed: ?Recent Labs  ?  08/02/21 ?1926 08/03/21 ?5701 08/14/21 ?0000 02/05/22 ?0000  ?NA 135 135 140 140  ?K 3.8 3.6 4.2 4.0  ?CL 102 101 106 106  ?CO2 24 25 25* 28*  ?GLUCOSE 107* 91  --   --   ?BUN 8 8 11 13   ?CREATININE 0.77 0.73 0.6 0.7  ?CALCIUM 9.2 8.3* 9.1 9.2  ? ?Recent Labs  ?  06/28/21 ?0000 08/02/21 ?1926 02/05/22 ?0000  ?AST 17 26 15   ?ALT 12 18 13   ?ALKPHOS 98 80 80  ?BILITOT  --  0.9  --   ?PROT  --  7.0  --   ?ALBUMIN 3.9 3.5 3.7  ? ?Recent Labs  ?  08/02/21 ?1926 08/03/21 ? 02/05/22 ?0000  ?WBC 5.4 4.8 6.5  ?NEUTROABS 4.0  --  3.50  ?HGB 14.3 12.5 13.2  ?HCT 45.7 38.4 40  ?MCV 90.0  88.5  --   ?PLT 169 151 197  ? ?Lab Results  ?Component Value Date  ? TSH 1.13 07/20/2020  ? ?No results found for: HGBA1C ?Lab Results  ?Component Value Date  ? CHOL 235 (A) 07/20/2020  ? HDL 100 (A) 09

## 2022-04-02 ENCOUNTER — Non-Acute Institutional Stay (SKILLED_NURSING_FACILITY): Payer: Medicare Other | Admitting: Orthopedic Surgery

## 2022-04-02 ENCOUNTER — Encounter: Payer: Self-pay | Admitting: Orthopedic Surgery

## 2022-04-02 DIAGNOSIS — N390 Urinary tract infection, site not specified: Secondary | ICD-10-CM

## 2022-04-02 DIAGNOSIS — G301 Alzheimer's disease with late onset: Secondary | ICD-10-CM

## 2022-04-02 DIAGNOSIS — W19XXXA Unspecified fall, initial encounter: Secondary | ICD-10-CM

## 2022-04-02 DIAGNOSIS — N3941 Urge incontinence: Secondary | ICD-10-CM

## 2022-04-02 DIAGNOSIS — N319 Neuromuscular dysfunction of bladder, unspecified: Secondary | ICD-10-CM

## 2022-04-02 DIAGNOSIS — F0280 Dementia in other diseases classified elsewhere without behavioral disturbance: Secondary | ICD-10-CM

## 2022-04-02 DIAGNOSIS — K5901 Slow transit constipation: Secondary | ICD-10-CM

## 2022-04-02 DIAGNOSIS — I10 Essential (primary) hypertension: Secondary | ICD-10-CM | POA: Diagnosis not present

## 2022-04-02 NOTE — Progress Notes (Signed)
Location:  [facility] Room Number: 140/A Place of Service:  SNF (31) Provider: Octavia Heir, NP   Patient Care Team: Mahlon Gammon, MD as PCP - General (Internal Medicine) Jamison Neighbor, MD (Urology)  Extended Emergency Contact Information Primary Emergency Contact: Gulick,[name] Address: [address]          [place], [place] [address] Darden Amber of [place] Home Phone: [phone] Work Phone: [phone] Relation: Son Secondary Emergency Contact: Stark Bray of [place] Home Phone: [phone] Work Phone: [phone] Mobile Phone: [phone] Relation: Son  Code Status:  DNR Goals of care: Advanced Directive information    04/02/2022   12:03 PM  Advanced Directives  Does Patient Have a Medical Advance Directive? Yes  Type of Estate agent of [place];Living will;Out of facility DNR (pink MOST or yellow form)  Does patient want to make changes to medical advance directive? No - Patient declined  Copy of Healthcare Power of Attorney in Chart? Yes - validated most recent copy scanned in chart (See row information)     Chief Complaint  Patient presents with   Medical Management of Chronic Issues    Routine visit.   Quality Metric Gaps    Discuss the need for Shingrix vaccine, or post pone if patient refuses.    HPI:  Pt is a 87 y.o. female seen today for medical management of chronic diseases.    She currently resides on the skilled nursing unit at [facility] due to Alzheimer's dementia. Past medical history includes: hypertension, recurrent UTI, constipation, weakness and spinal stenosis.  Fall- found on the floor in her room, no apparent injury, states " her knees gave out," ambulates with walker- shuffling gait Alzheimer's- MMSE 11/30, MRI brain noted moderate chronic ischemic changes in cerebral white matter, no recent behavioral outbursts, aphasia, not on medication  HTN-  BUN/creat 13/0.7 02/05/2022, see trends below, remains on losartan qAM and hydralazine qhs and prn for SBP> 180 Urge incontinence- remains on Flomax Neurogenic bladder- remains on Flomax Recurrent UTI- remains on nitrofurantoin and cranberry supplement Constipation- LBM 06/12, remains on senna and colace  Recent blood pressures:  06/12- 154/82  06/08- 150/72  06/02- 100/64  Recent weights:   06/01- 148 lbs  05/01- 148.4 lbs  04/01- 152 lbs     Past Medical History:  Diagnosis Date   Alzheimer disease (HCC)    Balance problem 07/19/2020   Constipation 07/19/2020   Dementia without behavioral disturbance (HCC) 07/19/2020   MMSE 21/30 07/21/20   Fall    Osteoarthritis    Pyelonephritis    Spinal stenosis 07/19/2020   Weakness of left lower extremity 07/19/2020   History reviewed. No pertinent surgical history.  Allergies  Allergen Reactions   Sulfa Antibiotics Rash    Rash to trunk, legs, neck, scalp, and arms    Outpatient Encounter Medications as of 04/02/2022  Medication Sig   acetaminophen (TYLENOL) 500 MG tablet Take 1,000 mg by mouth 3 (three) times daily.   acetaminophen (TYLENOL) 500 MG tablet Take 2 tablets (1,000 mg total) by mouth in the morning.   Cranberry (THERACRAN PO) Take 250 mg by mouth in the morning and at bedtime.   D-Mannose POWD Take 4 capsules by mouth daily. 99%   docusate sodium (COLACE) 100 MG capsule Take 100 mg by mouth 2 (two) times daily as needed for mild constipation.   Emollient (CERAVE) LOTN Apply topically daily. Apply Cerave lotion to back and  extremities   estradiol (ESTRING) 2 MG vaginal ring Place 2 mg vaginally every 3 (three) months. follow package directions   hydrALAZINE (APRESOLINE) 10 MG tablet Take 10 mg by mouth every 8 (eight) hours as needed (Pain).   hydrALAZINE (APRESOLINE) 10 MG tablet Take 10 mg by mouth at bedtime.   ibuprofen (ADVIL) 200 MG tablet Take 400 mg by mouth 3 (three) times daily as needed for mild pain.    losartan (COZAAR) 25 MG tablet Take 12.5 mg by mouth daily. Hold is SBP < 100 and inform provider if B/P greater than 160/90   nitrofurantoin (MACRODANTIN) 50 MG capsule Take 50 mg by mouth 2 (two) times daily.   senna (SENOKOT) 8.6 MG TABS tablet Take 1 tablet by mouth in the morning and at bedtime.   tamsulosin (FLOMAX) 0.4 MG CAPS capsule Take 0.4 mg by mouth at bedtime.   No facility-administered encounter medications on file as of 04/02/2022.    Review of Systems  Immunization History  Administered Date(s) Administered   Influenza, High Dose Seasonal PF 07/25/2021   Influenza-Unspecified 08/05/2018, 09/29/2018, 07/02/2019, 08/21/2019, 08/11/2020   Moderna Covid-19 Vaccine Bivalent Booster 5152yrs & up 08/01/2021   Moderna SARS-COV2 Booster Vaccination 08/31/2020, 06/12/2021   Moderna Sars-Covid-2 Vaccination 11/04/2019, 12/02/2019   Pneumococcal Conjugate-13 09/28/2018   Pneumococcal Polysaccharide-23 10/21/2017   Tdap 02/23/2021   Zoster Recombinat (Shingrix) 05/24/2021   Pertinent  Health Maintenance Due  Topic Date Due   INFLUENZA VACCINE  05/21/2022   DEXA SCAN  Discontinued      08/03/2021    6:00 PM 08/04/2021   12:00 AM 08/04/2021   10:39 AM 08/05/2021    1:00 AM 10/19/2021    1:02 PM  Fall Risk  Falls in the past year?     0  Was there an injury with Fall?     0  Fall Risk Category Calculator     0  Fall Risk Category     Low  Patient Fall Risk Level High fall risk High fall risk High fall risk High fall risk Moderate fall risk  Patient at Risk for Falls Due to     Impaired balance/gait  Fall risk Follow up     Falls evaluation completed   Functional Status Survey:    Vitals:   04/02/22 1159  BP: (!) 154/82  Pulse: 68  Resp: (!) 22  Temp: (!) 97.3 F (36.3 C)  SpO2: 95%  Weight: 148 lb (67.1 kg)  Height: 5\' 2"  (1.575 m)   Body mass index is 27.07 kg/m. Physical Exam Vitals reviewed.  Constitutional:      General: She is not in acute  distress. HENT:     Head: Normocephalic.     Right Ear: There is no impacted cerumen.     Left Ear: There is no impacted cerumen.     Nose: Nose normal.     Mouth/Throat:     Mouth: Mucous membranes are moist.  Eyes:     General:        Right eye: No discharge.        Left eye: No discharge.  Neck:     Vascular: No carotid bruit.  Cardiovascular:     Rate and Rhythm: Normal rate and regular rhythm.     Pulses: Normal pulses.     Heart sounds: Normal heart sounds.  Pulmonary:     Effort: Pulmonary effort is normal. No respiratory distress.     Breath sounds: Normal breath sounds. No  wheezing.  Abdominal:     General: Bowel sounds are normal. There is no distension.     Palpations: Abdomen is soft.     Tenderness: There is no abdominal tenderness.  Musculoskeletal:     Cervical back: Neck supple.     Right lower leg: No edema.     Left lower leg: No edema.     Comments: Able to move extremities without difficulty  Lymphadenopathy:     Cervical: No cervical adenopathy.  Skin:    General: Skin is warm and dry.     Capillary Refill: Capillary refill takes less than 2 seconds.     Comments: No bruising or abrasions on extremities  Neurological:     General: No focal deficit present.     Mental Status: She is alert. Mental status is at baseline.     Motor: Weakness present.     Gait: Gait abnormal.  Psychiatric:        Mood and Affect: Mood normal.        Behavior: Behavior normal.        Cognition and Memory: Cognition is impaired. Memory is impaired.     Comments: Follows commands, alert to self and familiar faces     Labs reviewed: Recent Labs    08/02/21 1926 08/03/21 0511 08/14/21 0000 02/05/22 0000  NA 135 135 140 140  K 3.8 3.6 4.2 4.0  CL 102 101 106 106  CO2 24 25 25* 28*  GLUCOSE 107* 91  --   --   BUN 8 8 11 13   CREATININE 0.77 0.73 0.6 0.7  CALCIUM 9.2 8.3* 9.1 9.2   Recent Labs    06/28/21 0000 08/02/21 1926 02/05/22 0000  AST 17 26 15   ALT  12 18 13   ALKPHOS 98 80 80  BILITOT  --  0.9  --   PROT  --  7.0  --   ALBUMIN 3.9 3.5 3.7   Recent Labs    08/02/21 1926 08/03/21 0511 02/05/22 0000  WBC 5.4 4.8 6.5  NEUTROABS 4.0  --  3.50  HGB 14.3 12.5 13.2  HCT 45.7 38.4 40  MCV 90.0 88.5  --   PLT 169 151 197   Lab Results  Component Value Date   TSH 1.13 07/20/2020   No results found for: "HGBA1C" Lab Results  Component Value Date   CHOL 235 (A) 07/20/2020   HDL 100 (A) 07/20/2020   LDLCALC 134 07/20/2020   TRIG 102 07/20/2020    Significant Diagnostic Results in last 30 days:  No results found.  Assessment/Plan 1. Fall, initial encounter -06/12 found on floor in room - no apparent injury - able to move extremities without pain  2. Late onset Alzheimer's dementia without behavioral disturbance (HCC) - no behavioral outbursts - cont skilled nursing care  3. Essential hypertension - controlled - cont hydralazine qhs and prn  4. Urge incontinence - cont Flomax  5. Neurogenic bladder - cont Flomax  6. Recurrent urinary tract infection - cont nitrofurantoin and cranberry   7. Slow transit constipation - abdomen sift - cont colace and senna    Family/ staff Communication: plan discussed with patient and nurse  Labs/tests ordered:  none

## 2022-04-11 ENCOUNTER — Non-Acute Institutional Stay (SKILLED_NURSING_FACILITY): Payer: Medicare Other | Admitting: Adult Health

## 2022-04-11 ENCOUNTER — Encounter: Payer: Self-pay | Admitting: Adult Health

## 2022-04-11 DIAGNOSIS — B029 Zoster without complications: Secondary | ICD-10-CM

## 2022-04-11 NOTE — Progress Notes (Unsigned)
Location:  [facility] Room Number: 140/A Place of Service:  SNF [id number]) Provider: Fletcher Anon, NP  Patient Care Team: Mahlon Gammon, MD as PCP - General (Internal Medicine) Jamison Neighbor, MD (Urology)  Extended Emergency Contact Information Primary Emergency Contact: Miyoshi,[name] Address: [address]          [place], [place] [address] Darden Amber of [place] Home Phone: [phone] Work Phone: [phone] Relation: Son Secondary Emergency Contact: Stark Bray of [place] Home Phone: [phone] Work Phone: [phone] Mobile Phone: [phone] Relation: Son  Code Status:  DNR Goals of care: Advanced Directive information    04/11/2022   10:08 AM  Advanced Directives  Does Patient Have a Medical Advance Directive? Yes  Type of Estate agent of [place];Living will;Out of facility DNR (pink MOST or yellow form)  Does patient want to make changes to medical advance directive? No - Patient declined  Copy of Healthcare Power of Attorney in Chart? Yes - validated most recent copy scanned in chart (See row information)     Chief Complaint  Patient presents with   Acute Visit    Rash    HPI:  Pt is a 87 y.o. female seen today for an acute visit for    Past Medical History:  Diagnosis Date   Alzheimer disease (HCC)    Balance problem 07/19/2020   Constipation 07/19/2020   Dementia without behavioral disturbance (HCC) 07/19/2020   MMSE 21/30 07/21/20   Fall    Osteoarthritis    Pyelonephritis    Spinal stenosis 07/19/2020   Weakness of left lower extremity 07/19/2020   History reviewed. No pertinent surgical history.  Allergies  Allergen Reactions   Sulfa Antibiotics Rash    Rash to trunk, legs, neck, scalp, and arms    Outpatient Encounter Medications as of 04/11/2022  Medication Sig   acetaminophen (TYLENOL) 500 MG tablet Take 1,000 mg by mouth 3 (three) times daily.    acetaminophen (TYLENOL) 500 MG tablet Take 2 tablets (1,000 mg total) by mouth in the morning.   Cranberry (THERACRAN PO) Take 250 mg by mouth in the morning and at bedtime.   D-Mannose POWD Take 4 capsules by mouth daily. 99%   docusate sodium (COLACE) 100 MG capsule Take 100 mg by mouth 2 (two) times daily as needed for mild constipation.   Emollient (CERAVE) LOTN Apply topically daily. Apply Cerave lotion to back and extremities   estradiol (ESTRING) 2 MG vaginal ring Place 2 mg vaginally every 3 (three) months. follow package directions   hydrALAZINE (APRESOLINE) 10 MG tablet Take 10 mg by mouth every 8 (eight) hours as needed (Pain).   hydrALAZINE (APRESOLINE) 10 MG tablet Take 10 mg by mouth at bedtime.   ibuprofen (ADVIL) 200 MG tablet Take 400 mg by mouth 3 (three) times daily as needed for mild pain.   losartan (COZAAR) 25 MG tablet Take 12.5 mg by mouth daily. Hold is SBP < 100 and inform provider if B/P greater than 160/90   nitrofurantoin (MACRODANTIN) 50 MG capsule Take 50 mg by mouth 2 (two) times daily.   senna (SENOKOT) 8.6 MG TABS tablet Take 1 tablet by mouth in the morning and at bedtime.   tamsulosin (FLOMAX) 0.4 MG CAPS capsule Take 0.4 mg by mouth at bedtime.   No facility-administered encounter medications on file as of 04/11/2022.    Review of Systems  Immunization History  Administered Date(s) Administered   Influenza,  High Dose Seasonal PF 07/25/2021   Influenza-Unspecified 08/05/2018, 09/29/2018, 07/02/2019, 08/21/2019, 08/11/2020   Moderna Covid-19 Vaccine Bivalent Booster 59yrs & up 08/01/2021   Moderna SARS-COV2 Booster Vaccination 08/31/2020, 06/12/2021   Moderna Sars-Covid-2 Vaccination 11/04/2019, 12/02/2019   Pneumococcal Conjugate-13 09/28/2018   Pneumococcal Polysaccharide-23 10/21/2017   Tdap 02/23/2021   Zoster Recombinat (Shingrix) 05/24/2021   Pertinent  Health Maintenance Due  Topic Date Due   INFLUENZA VACCINE  05/21/2022   DEXA SCAN   Discontinued      08/03/2021    6:00 PM 08/04/2021   12:00 AM 08/04/2021   10:39 AM 08/05/2021    1:00 AM 10/19/2021    1:02 PM  Fall Risk  Falls in the past year?     0  Was there an injury with Fall?     0  Fall Risk Category Calculator     0  Fall Risk Category     Low  Patient Fall Risk Level High fall risk High fall risk High fall risk High fall risk Moderate fall risk  Patient at Risk for Falls Due to     Impaired balance/gait  Fall risk Follow up     Falls evaluation completed   Functional Status Survey:    Vitals:   04/11/22 1002  BP: 138/80  Pulse: 82  Resp: 18  Temp: 97.6 F (36.4 C)  SpO2: 98%  Weight: 148 lb (67.1 kg)  Height: 5\' 2"  (1.575 m)   Body mass index is 27.07 kg/m. Physical Exam  Labs reviewed: Recent Labs    08/02/21 1926 08/03/21 0511 08/14/21 0000 02/05/22 0000  NA 135 135 140 140  K 3.8 3.6 4.2 4.0  CL 102 101 106 106  CO2 24 25 25* 28*  GLUCOSE 107* 91  --   --   BUN 8 8 11 13   CREATININE 0.77 0.73 0.6 0.7  CALCIUM 9.2 8.3* 9.1 9.2   Recent Labs    06/28/21 0000 08/02/21 1926 02/05/22 0000  AST 17 26 15   ALT 12 18 13   ALKPHOS 98 80 80  BILITOT  --  0.9  --   PROT  --  7.0  --   ALBUMIN 3.9 3.5 3.7   Recent Labs    08/02/21 1926 08/03/21 0511 02/05/22 0000  WBC 5.4 4.8 6.5  NEUTROABS 4.0  --  3.50  HGB 14.3 12.5 13.2  HCT 45.7 38.4 40  MCV 90.0 88.5  --   PLT 169 151 197   Lab Results  Component Value Date   TSH 1.13 07/20/2020   No results found for: "HGBA1C" Lab Results  Component Value Date   CHOL 235 (A) 07/20/2020   HDL 100 (A) 07/20/2020   LDLCALC 134 07/20/2020   TRIG 102 07/20/2020    Significant Diagnostic Results in last 30 days:  No results found.  Assessment/Plan There are no diagnoses linked to this encounter.   Family/ staff Communication: ***  Labs/tests ordered:  ***

## 2022-04-12 ENCOUNTER — Encounter: Payer: Self-pay | Admitting: Adult Health

## 2022-04-12 MED ORDER — VALACYCLOVIR HCL 1 G PO TABS: 1000.0000 mg | ORAL_TABLET | Freq: Two times a day (BID) | ORAL | 0 refills | Status: DC

## 2022-04-15 ENCOUNTER — Encounter: Payer: Self-pay | Admitting: Internal Medicine

## 2022-04-15 ENCOUNTER — Non-Acute Institutional Stay (SKILLED_NURSING_FACILITY): Payer: Medicare Other | Admitting: Internal Medicine

## 2022-04-15 DIAGNOSIS — L739 Follicular disorder, unspecified: Secondary | ICD-10-CM

## 2022-04-15 DIAGNOSIS — F0280 Dementia in other diseases classified elsewhere without behavioral disturbance: Secondary | ICD-10-CM | POA: Diagnosis not present

## 2022-04-15 DIAGNOSIS — I10 Essential (primary) hypertension: Secondary | ICD-10-CM

## 2022-04-15 DIAGNOSIS — G301 Alzheimer's disease with late onset: Secondary | ICD-10-CM

## 2022-04-18 ENCOUNTER — Non-Acute Institutional Stay (SKILLED_NURSING_FACILITY): Payer: Medicare Other | Admitting: Adult Health

## 2022-04-18 ENCOUNTER — Encounter: Payer: Self-pay | Admitting: Adult Health

## 2022-04-18 DIAGNOSIS — R55 Syncope and collapse: Secondary | ICD-10-CM | POA: Diagnosis not present

## 2022-04-18 DIAGNOSIS — I10 Essential (primary) hypertension: Secondary | ICD-10-CM | POA: Diagnosis not present

## 2022-04-18 DIAGNOSIS — I951 Orthostatic hypotension: Secondary | ICD-10-CM | POA: Diagnosis not present

## 2022-04-18 DIAGNOSIS — N319 Neuromuscular dysfunction of bladder, unspecified: Secondary | ICD-10-CM | POA: Diagnosis not present

## 2022-04-18 NOTE — Progress Notes (Addendum)
Location:  [facility] Room Number: 140 A Place of Service:  SNF [phone]) Provider:  Royal Hawthorn, NP   Patient Care Team: Virgie Dad, MD as PCP - General (Internal Medicine) Domingo Pulse, MD (Urology)  Extended Emergency Contact Information Primary Emergency Contact: Buth,[name] Address: [address] [place]          [place], [place] [address] Johnnette Litter of [place] Phone: [phone] Work Phone: [phone] Relation: Son Secondary Emergency Contact: Paul Dykes of [place] Phone: [phone] Work Phone: [phone] Mobile Phone: [phone] Relation: Son  Code Status:  DNR Goals of care: Advanced Directive information    04/18/2022    2:50 PM  Advanced Directives  Does Patient Have a Medical Advance Directive? Yes  Type of Advance Directive Living will;Out of facility DNR (pink MOST or yellow form)  Does patient want to make changes to medical advance directive? No - Patient declined     Chief Complaint  Patient presents with   Acute Visit    Low B/P     HPI:  Pt is a 87 y.o. female seen today for an acute visit for low BP. Nurse reports that Ms. Hagberg's bp was 186/91 after walking to the dining room and sitting. She was given losartan 12.5 and then later she walked to the BR and passed out. They do not think she hit her head. BP was 80/58.  Recheck now 132/60  Bps reviewed in matrix mostly 120-170's. Occasional outlier in the 80s HR 60-80s She has a hx of this with recurrent orthostatic hypotension but also has supine htn. She wears compression. No reports of low intake or vomiting or diarrhea. At times when her bp is low she will have nausea.  Also is on flomax for urinary retention. No current urinary symptoms Can't isolate the bp issues with to postprandial. Hydralazine has been held for several nights No sob or chest pain.Marland Kitchen Has had long term issues with dizziness.Difficult to obtain a  hx due to dementia.  Wt Readings from Last 3 Encounters:  04/18/22 148 lb (67.1 kg)  04/15/22 148 lb (67.1 kg)  04/11/22 148 lb (67.1 kg)  MRI done for dizziness 08/29/21 which showed no acute abnormality with a few scatter chronic micro hemorrhages possibly due to hypertensive angiopathy. ALso showed moderate chronic vessel disease and cerebellar atrophy     Past Medical History:  Diagnosis Date   Alzheimer disease ([place])    Balance problem 07/19/2020   Constipation 07/19/2020   Dementia without behavioral disturbance ([place]) 07/19/2020   MMSE 21/30 07/21/20   Fall    Osteoarthritis    Pyelonephritis    Spinal stenosis 07/19/2020   Weakness of left lower extremity 07/19/2020   History reviewed. No pertinent surgical history.  Allergies  Allergen Reactions   Sulfa Antibiotics Rash    Rash to trunk, legs, neck, scalp, and arms    Outpatient Encounter Medications as of 04/18/2022  Medication Sig   acetaminophen (TYLENOL) 500 MG tablet Take 1,000 mg by mouth 3 (three) times daily as needed.   acetaminophen (TYLENOL) 500 MG tablet Take 2 tablets (1,000 mg total) by mouth in the morning.   Cranberry (THERACRAN PO) Take 250 mg by mouth in the morning and at bedtime.   D-Mannose POWD Take 4 capsules by mouth daily. 99%   docusate sodium (COLACE) 100 MG capsule Take 100 mg by mouth 2 (two) times daily as needed for mild constipation.  Emollient (CERAVE) LOTN Apply topically daily. Apply Cerave lotion to back and extremities   estradiol (ESTRING) 2 MG vaginal ring Place 2 mg vaginally every 3 (three) months. follow package directions   hydrALAZINE (APRESOLINE) 10 MG tablet Take 10 mg by mouth every 8 (eight) hours as needed.   hydrALAZINE (APRESOLINE) 10 MG tablet Take 10 mg by mouth at bedtime.   ibuprofen (ADVIL) 200 MG tablet Take 400 mg by mouth 3 (three) times daily as needed for mild pain.   losartan (COZAAR) 25 MG tablet Take 12.5 mg by mouth daily. Hold is SBP < 100 and inform  provider if B/P greater than 160/90   nitrofurantoin (MACRODANTIN) 50 MG capsule Take 50 mg by mouth 2 (two) times daily.   senna (SENOKOT) 8.6 MG TABS tablet Take 1 tablet by mouth in the morning and at bedtime.   tamsulosin (FLOMAX) 0.4 MG CAPS capsule Take 0.4 mg by mouth at bedtime.   [DISCONTINUED] valACYclovir (VALTREX) 1000 MG tablet Take 1 tablet (1,000 mg total) by mouth 2 (two) times daily for 7 days.   No facility-administered encounter medications on file as of 04/18/2022.    Review of Systems  Constitutional:  Negative for activity change, appetite change, chills, diaphoresis, fatigue, fever and unexpected weight change.  HENT:  Negative for congestion.   Respiratory:  Negative for cough, shortness of breath and wheezing.   Cardiovascular:  Negative for chest pain, palpitations and leg swelling.  Gastrointestinal:  Negative for abdominal distention, abdominal pain, constipation and diarrhea.  Genitourinary:  Negative for difficulty urinating and dysuria.  Musculoskeletal:  Positive for gait problem. Negative for arthralgias, back pain, joint swelling and myalgias.  Neurological:  Positive for dizziness and syncope. Negative for tremors, seizures, facial asymmetry, speech difficulty, weakness, light-headedness, numbness and headaches.  Psychiatric/Behavioral:  Positive for confusion (dementia). Negative for agitation and behavioral problems.     Immunization History  Administered Date(s) Administered   Influenza, High Dose Seasonal PF 07/25/2021   Influenza-Unspecified 08/05/2018, 09/29/2018, 07/02/2019, 08/21/2019, 08/11/2020   Moderna Covid-19 Vaccine Bivalent Booster 5yrs & up 08/01/2021   Moderna SARS-COV2 Booster Vaccination 08/31/2020, 06/12/2021   Moderna Sars-Covid-2 Vaccination 11/04/2019, 12/02/2019   Pneumococcal Conjugate-13 09/28/2018   Pneumococcal Polysaccharide-23 10/21/2017   Tdap 02/23/2021   Zoster Recombinat (Shingrix) 05/24/2021   Pertinent  Health  Maintenance Due  Topic Date Due   INFLUENZA VACCINE  05/21/2022   DEXA SCAN  Discontinued      08/03/2021    6:00 PM 08/04/2021   12:00 AM 08/04/2021   10:39 AM 08/05/2021    1:00 AM 10/19/2021    1:02 PM  Fall Risk  Falls in the past year?     0  Was there an injury with Fall?     0  Fall Risk Category Calculator     0  Fall Risk Category     Low  Patient Fall Risk Level High fall risk High fall risk High fall risk High fall risk Moderate fall risk  Patient at Risk for Falls Due to     Impaired balance/gait  Fall risk Follow up     Falls evaluation completed   Functional Status Survey:    Vitals:   04/18/22 1447  Pulse: 68  Resp: 20  Temp: (!) 97.2 F (36.2 C)  SpO2: 97%  Weight: 148 lb (67.1 kg)  Height: 5\' 2"  (1.575 m)   Body mass index is 27.07 kg/m. Physical Exam Vitals and nursing note reviewed.  Constitutional:  General: She is not in acute distress.    Appearance: She is not diaphoretic.  HENT:     Head: Normocephalic and atraumatic.     Nose: Nose normal.     Mouth/Throat:     Mouth: Mucous membranes are moist.     Pharynx: Oropharynx is clear.  Eyes:     Extraocular Movements: Extraocular movements intact.     Conjunctiva/sclera: Conjunctivae normal.     Pupils: Pupils are equal, round, and reactive to light.  Neck:     Vascular: No JVD.  Cardiovascular:     Rate and Rhythm: Normal rate and regular rhythm.     Heart sounds: No murmur heard. Pulmonary:     Effort: Pulmonary effort is normal. No respiratory distress.     Breath sounds: Normal breath sounds. No wheezing.  Abdominal:     General: Abdomen is flat. Bowel sounds are normal. There is no distension.     Palpations: Abdomen is soft.     Tenderness: There is no abdominal tenderness. There is no right CVA tenderness or left CVA tenderness.  Musculoskeletal:     Right lower leg: No edema.     Left lower leg: Edema present.  Skin:    General: Skin is warm and dry.  Neurological:      General: No focal deficit present.     Mental Status: She is alert. Mental status is at baseline.     Cranial Nerves: No cranial nerve deficit.  Psychiatric:        Mood and Affect: Mood normal.     Labs reviewed: Recent Labs    08/02/21 1926 08/03/21 0511 08/14/21 0000 02/05/22 0000  NA 135 135 140 140  K 3.8 3.6 4.2 4.0  CL 102 101 106 106  CO2 24 25 25* 28*  GLUCOSE 107* 91  --   --   BUN 8 8 11 13   CREATININE 0.77 0.73 0.6 0.7  CALCIUM 9.2 8.3* 9.1 9.2   Recent Labs    06/28/21 0000 08/02/21 1926 02/05/22 0000  AST 17 26 15   ALT 12 18 13   ALKPHOS 98 80 80  BILITOT  --  0.9  --   PROT  --  7.0  --   ALBUMIN 3.9 3.5 3.7   Recent Labs    08/02/21 1926 08/03/21 0511 02/05/22 0000  WBC 5.4 4.8 6.5  NEUTROABS 4.0  --  3.50  HGB 14.3 12.5 13.2  HCT 45.7 38.4 40  MCV 90.0 88.5  --   PLT 169 151 197   Lab Results  Component Value Date   TSH 1.13 07/20/2020   No results found for: "HGBA1C" Lab Results  Component Value Date   CHOL 235 (A) 07/20/2020   HDL 100 (A) 07/20/2020   LDLCALC 134 07/20/2020   TRIG 102 07/20/2020    Significant Diagnostic Results in last 30 days:  No results found.  Assessment/Plan  1. Orthostatic hypotension with sycope Long term issues with syncope now will place referral to cardiology  Check labs including cortisol due to low bp and nausea.  Encourage hydration Try firmer compression hose 20-30 mmHg Try abd binder Rise slowly  2. Essential hypertension D/c scheduled hydralazine. Keep prn Only given losartan at lunch time after sitting up if SBP>195 Loose control due to fall risk   3. Neurogenic bladder Continues on flomax. May need to f/u with urology and see if she can come off of it (in the past she has required I and  O Catheters with recurrent UTI)   Labs/tests ordered:  CBC BMP TSH Cortisol in am

## 2022-04-19 LAB — COMPREHENSIVE METABOLIC PANEL: Calcium: 9.4 (ref 8.7–10.7)

## 2022-04-19 LAB — CBC AND DIFFERENTIAL
HCT: 40 (ref 36–46)
Hemoglobin: 13.6 (ref 12.0–16.0)
Platelets: 198 10*3/uL (ref 150–400)
WBC: 5.9

## 2022-04-19 LAB — BASIC METABOLIC PANEL
BUN: 14 (ref 4–21)
CO2: 26 — AB (ref 13–22)
Chloride: 103 (ref 99–108)
Creatinine: 0.8 (ref 0.5–1.1)
Glucose: 94
Potassium: 4.3 mEq/L (ref 3.5–5.1)
Sodium: 139 (ref 137–147)

## 2022-04-19 LAB — CBC: RBC: 4.58 (ref 3.87–5.11)

## 2022-04-19 LAB — TSH: TSH: 1.67 (ref 0.41–5.90)

## 2022-04-24 ENCOUNTER — Telehealth: Payer: Self-pay

## 2022-04-24 NOTE — Telephone Encounter (Signed)
NOTES SCANNED TO REFERRAL 

## 2022-04-25 ENCOUNTER — Emergency Department (HOSPITAL_COMMUNITY)
Admission: EM | Admit: 2022-04-25 | Discharge: 2022-04-25 | Disposition: A | Discharge status: Home / Self Care | Payer: Medicare Other | Attending: Emergency Medicine | Admitting: Emergency Medicine

## 2022-04-25 ENCOUNTER — Emergency Department (HOSPITAL_COMMUNITY): Payer: Medicare Other

## 2022-04-25 ENCOUNTER — Other Ambulatory Visit: Payer: Self-pay

## 2022-04-25 ENCOUNTER — Encounter (HOSPITAL_COMMUNITY): Payer: Self-pay | Admitting: *Deleted

## 2022-04-25 DIAGNOSIS — N390 Urinary tract infection, site not specified: Secondary | ICD-10-CM | POA: Diagnosis not present

## 2022-04-25 DIAGNOSIS — I951 Orthostatic hypotension: Secondary | ICD-10-CM | POA: Insufficient documentation

## 2022-04-25 DIAGNOSIS — F0390 Unspecified dementia without behavioral disturbance: Secondary | ICD-10-CM | POA: Insufficient documentation

## 2022-04-25 DIAGNOSIS — Z792 Long term (current) use of antibiotics: Secondary | ICD-10-CM | POA: Diagnosis not present

## 2022-04-25 DIAGNOSIS — Z66 Do not resuscitate: Secondary | ICD-10-CM | POA: Diagnosis not present

## 2022-04-25 DIAGNOSIS — R55 Syncope and collapse: Secondary | ICD-10-CM

## 2022-04-25 LAB — URINALYSIS, ROUTINE W REFLEX MICROSCOPIC
Bilirubin Urine: NEGATIVE
Glucose, UA: NEGATIVE mg/dL
Hgb urine dipstick: NEGATIVE
Ketones, ur: NEGATIVE mg/dL
Leukocytes,Ua: NEGATIVE
Nitrite: NEGATIVE
Protein, ur: NEGATIVE mg/dL
Specific Gravity, Urine: 1.01 (ref 1.005–1.030)
pH: 6 (ref 5.0–8.0)

## 2022-04-25 LAB — CBC WITH DIFFERENTIAL/PLATELET
Abs Immature Granulocytes: 0.03 10*3/uL (ref 0.00–0.07)
Basophils Absolute: 0.1 10*3/uL (ref 0.0–0.1)
Basophils Relative: 1 %
Eosinophils Absolute: 0.2 10*3/uL (ref 0.0–0.5)
Eosinophils Relative: 3 %
HCT: 42.1 % (ref 36.0–46.0)
Hemoglobin: 13.7 g/dL (ref 12.0–15.0)
Immature Granulocytes: 0 %
Lymphocytes Relative: 23 %
Lymphs Abs: 1.6 10*3/uL (ref 0.7–4.0)
MCH: 29.1 pg (ref 26.0–34.0)
MCHC: 32.5 g/dL (ref 30.0–36.0)
MCV: 89.6 fL (ref 80.0–100.0)
Monocytes Absolute: 0.6 10*3/uL (ref 0.1–1.0)
Monocytes Relative: 9 %
Neutro Abs: 4.4 10*3/uL (ref 1.7–7.7)
Neutrophils Relative %: 64 %
Platelets: 178 10*3/uL (ref 150–400)
RBC: 4.7 MIL/uL (ref 3.87–5.11)
RDW: 13.2 % (ref 11.5–15.5)
WBC: 6.9 10*3/uL (ref 4.0–10.5)
nRBC: 0 % (ref 0.0–0.2)

## 2022-04-25 LAB — T4, FREE: Free T4: 1.14 ng/dL — ABNORMAL HIGH (ref 0.61–1.12)

## 2022-04-25 LAB — BASIC METABOLIC PANEL
Anion gap: 9 (ref 5–15)
BUN: 13 mg/dL (ref 8–23)
CO2: 26 mmol/L (ref 22–32)
Calcium: 9.2 mg/dL (ref 8.9–10.3)
Chloride: 104 mmol/L (ref 98–111)
Creatinine, Ser: 0.89 mg/dL (ref 0.44–1.00)
GFR, Estimated: >60 mL/min (ref >60)
Glucose, Bld: 113 mg/dL — ABNORMAL HIGH (ref 70–99)
Potassium: 4.4 mmol/L (ref 3.5–5.1)
Sodium: 139 mmol/L (ref 135–145)

## 2022-04-25 LAB — TSH: TSH: 0.942 u[IU]/mL (ref 0.350–4.500)

## 2022-04-25 MED ORDER — SODIUM CHLORIDE 0.9 % IV BOLUS
1000.0000 mL | Freq: Once | INTRAVENOUS | Status: AC
  Administered 2022-04-25: 1000 mL via INTRAVENOUS

## 2022-04-25 NOTE — ED Notes (Signed)
Report given to nurse Patsy Lager at [facility], [place] to transport pt back to facility.

## 2022-04-25 NOTE — ED Provider Notes (Signed)
[facility] EMERGENCY DEPARTMENT Provider Note   CSN: [id number] Arrival date & time:        History  Chief Complaint  Patient presents with   Hypotension   Dizziness    Cheryl Monroe is a 87 y.o. female.  Level 5 caveat due to dementia.  Talked with the son on the phone who states that patient has history of hypotension from orthostatic as well as hypertension and overall has labile blood pressure.  She lives at a nursing home.  She has had some issues with urinary tract infections and urinary retention in the past.  She is on chronic Macrobid.  She has ambulation issues at baseline.  She is DNR.  Supposedly at nursing home today she was complaining of feeling little bit weak and they checked her blood pressure and it was low but with EMS has been normal.  No fevers.  She has no complaints.  She can tell me her name.  The history is provided by the patient and a caregiver.       Home Medications Prior to Admission medications   Medication Sig Start Date End Date Taking? Authorizing Provider  acetaminophen (TYLENOL) 500 MG tablet Take 1,000 mg by mouth 3 (three) times daily as needed.    [provider]  acetaminophen (TYLENOL) 500 MG tablet Take 2 tablets (1,000 mg total) by mouth in the morning. 12/25/21   Fargo, Amy E, NP  Cranberry (THERACRAN PO) Take 250 mg by mouth in the morning and at bedtime.    [provider]  D-Mannose POWD Take 4 capsules by mouth daily. 99%    [provider]  docusate sodium (COLACE) 100 MG capsule Take 100 mg by mouth 2 (two) times daily as needed for mild constipation.    [provider]  Emollient (CERAVE) LOTN Apply topically daily. Apply Cerave lotion to back and extremities    [provider]  estradiol (ESTRING) 2 MG vaginal ring Place 2 mg vaginally every 3 (three) months. follow package directions 07/19/20   Renato Gails, Tiffany L, DO  hydrALAZINE (APRESOLINE) 10 MG tablet Take 10 mg by  mouth every 8 (eight) hours as needed.    [provider]  ibuprofen (ADVIL) 200 MG tablet Take 400 mg by mouth 3 (three) times daily as needed for mild pain.    [provider]  losartan (COZAAR) 25 MG tablet Take 12.5 mg by mouth daily. Only given if SBP is 195 or greater.    [provider]  nitrofurantoin (MACRODANTIN) 50 MG capsule Take 50 mg by mouth 2 (two) times daily.    [provider]  senna (SENOKOT) 8.6 MG TABS tablet Take 1 tablet by mouth in the morning and at bedtime.    [provider]  tamsulosin (FLOMAX) 0.4 MG CAPS capsule Take 0.4 mg by mouth at bedtime.    [provider]      Allergies    Sulfa antibiotics    Review of Systems   Review of Systems  Physical Exam Updated Vital Signs BP (!) 215/84   Pulse 62   Temp (!) 97.5 F (36.4 C) (Oral)   Resp (!) 21   Ht 5\' 2"  (1.575 m)   Wt 67.1 kg   SpO2 98%   BMI 27.07 kg/m  Physical Exam Vitals and nursing note reviewed.  Constitutional:      General: She is not in acute distress.    Appearance: She is well-developed. She is not  ill-appearing.  HENT:     Head: Normocephalic and atraumatic.  Eyes:     Extraocular Movements: Extraocular movements intact.     Conjunctiva/sclera: Conjunctivae normal.     Pupils: Pupils are equal, round, and reactive to light.  Cardiovascular:     Rate and Rhythm: Normal rate and regular rhythm.     Pulses: Normal pulses.     Heart sounds: Normal heart sounds. No murmur heard. Pulmonary:     Effort: Pulmonary effort is normal. No respiratory distress.     Breath sounds: Normal breath sounds.  Abdominal:     Palpations: Abdomen is soft.     Tenderness: There is no abdominal tenderness.  Musculoskeletal:        General: No swelling.     Cervical back: Normal range of motion and neck supple.  Skin:    General: Skin is warm and dry.     Capillary Refill: Capillary refill takes less than 2 seconds.  Neurological:      General: No focal deficit present.     Mental Status: She is alert.  Psychiatric:        Mood and Affect: Mood normal.     ED Results / Procedures / Treatments   Labs (all labs ordered are listed, but only abnormal results are displayed) Labs Reviewed  BASIC METABOLIC PANEL - Abnormal; Notable for the following components:      Result Value   Glucose, Bld 113 (*)    All other components within normal limits  T4, FREE - Abnormal; Notable for the following components:   Free T4 1.14 (*)    All other components within normal limits  URINE CULTURE  CBC WITH DIFFERENTIAL/PLATELET  URINALYSIS, ROUTINE W REFLEX MICROSCOPIC  TSH    EKG EKG Interpretation  Date/Time:  Thursday April 25 2022 11:09:20 EDT Ventricular Rate:  56 PR Interval:  160 QRS Duration: 149 QT Interval:  453 QTC Calculation: 438 R Axis:   32 Text Interpretation: Sinus rhythm Right bundle branch block Confirmed by Virgina Norfolk ([id number]) on 04/25/2022 11:25:26 AM  Radiology DG Chest Portable 1 View  Result Date: 04/25/2022 CLINICAL DATA:  weakness EXAM: PORTABLE CHEST 1 VIEW COMPARISON:  None Available. FINDINGS: Heart is borderline. Thoracic aorta is ectatic. There is rightward deviation of the trachea seen with an apparent soft tissue attenuation at the thoracic inlet. Both lungs are clear. The visualized skeletal structures are unremarkable. IMPRESSION: Heart is border line. Lungs are clear. Rightward deviation of the trachea with an apparent soft tissue attenuation of likely prominent innominate artery or thyroid enlargement. Clinical and ultrasonographic correlation is suggested. Electronically Signed   By: Marjo Bicker M.D.   On: 04/25/2022 11:36    Procedures Procedures    Medications Ordered in ED Medications  sodium chloride 0.9 % bolus 1,000 mL (1,000 mLs Intravenous New Bag/Given 04/25/22 1156)    ED Course/ Medical Decision Making/ A&P                           Medical Decision Making Amount and/or  Complexity of Data Reviewed Labs: ordered. Radiology: ordered.   Demyah Smyre is here with generalized weakness.  History of orthostatic hypotension, dementia, chronic UTI.  Normal vitals.  No fever.  Seems to be at her neurologic baseline.  Talked on the phone with the son who states that facility checked her blood pressure and she was in the 60s systolic.  Has a history of the same.  She is prone to urine infections and is on chronic Macrobid.  She could be dehydrated he thinks.  Patient did not have a fall or syncopal event today.  Overall she appears well.  We will do broad work-up to evaluate.  Differential diagnosis is possibly dehydration versus UTI versus brief orthostatic hypotension.  Will give IV fluids, check CBC, BMP, urinalysis.  EKG per my review and interpretation shows sinus rhythm.  No ischemic changes.  Per my review and interpretation of labs, there is no urine infection.  No significant anemia, electrolyte abnormality, leukocytosis.  Chest x-ray with no pneumonia.  Suspect may be some thyromegaly.  Recommend the family to pursue outpatient ultrasound.  Thyroid studies are normal.  Overall patient's had some labile blood pressure here but this seems to be her baseline.  She seems to be at her mental baseline as well.  Discharged in good condition.  No evidence of urinary retention.  This chart was dictated using voice recognition software.  Despite best efforts to proofread,  errors can occur which can change the documentation meaning.         Final Clinical Impression(s) / ED Diagnoses Final diagnoses:  Near syncope    Rx / DC Orders ED Discharge Orders     None         Virgina Norfolk, DO 04/25/22 1405

## 2022-04-25 NOTE — ED Notes (Signed)
Pt found undressed with monitoring equipment in floor. Pt placed back in gown and back on BP cuff and pulse ox.

## 2022-04-25 NOTE — ED Notes (Signed)
Purewick in place per MD Cur[place].

## 2022-04-25 NOTE — ED Triage Notes (Signed)
PT arrived via EMS from [facility] for hypotension with standing. SNF stated bp dropped to 60/40 with standing.  PT has history of dementia and uti, orthostatic hypotension.  Pt is pleasantly confused

## 2022-04-26 LAB — URINE CULTURE

## 2022-04-27 ENCOUNTER — Other Ambulatory Visit: Payer: Self-pay

## 2022-04-29 LAB — CORTISOL: Cortisol: 18.4

## 2022-05-14 ENCOUNTER — Ambulatory Visit (INDEPENDENT_AMBULATORY_CARE_PROVIDER_SITE_OTHER): Payer: Medicare Other | Admitting: Cardiovascular Disease

## 2022-05-14 ENCOUNTER — Encounter: Payer: Self-pay | Admitting: Cardiovascular Disease

## 2022-05-14 VITALS — BP 138/80 | HR 54 | Ht 62.0 in | Wt 145.0 lb

## 2022-05-14 DIAGNOSIS — I10 Essential (primary) hypertension: Secondary | ICD-10-CM

## 2022-05-14 MED ORDER — LOSARTAN POTASSIUM 25 MG PO TABS: 25.0000 mg | ORAL_TABLET | Freq: Every day | ORAL | 3 refills | Status: DC

## 2022-05-14 NOTE — Patient Instructions (Signed)
Medication Instructions:  Your physician has recommended you make the following change in your medication:  Increased Losartan to 25 mg daily  *If you need a refill on your cardiac medications before your next appointment, please call your pharmacy*   Follow-Up: At [facility], you and your health needs are our priority.  As part of our continuing mission to provide you with exceptional heart care, we have created designated Provider Care Teams.  These Care Teams include your primary Cardiologist (physician) and Advanced Practice Providers (APPs -  Physician Assistants and Nurse Practitioners) who all work together to provide you with the care you need, when you need it.  We recommend signing up for the patient portal called "MyChart".  Sign up information is provided on this After Visit Summary.  MyChart is used to connect with patients for Virtual Visits (Telemedicine).  Patients are able to view lab/test results, encounter notes, upcoming appointments, etc.  Non-urgent messages can be sent to your provider as well.   To learn more about what you can do with MyChart, go to [email or web address].    Your next appointment:   As needed  Provider:   Dr. Melburn Popper

## 2022-05-14 NOTE — Progress Notes (Signed)
Cardiology Office Note:    Date:  05/14/2022   ID:  Cheryl Monroe, DOB 1935/06/12, MRN [id number]  PCP:  Cheryl Gammon, MD   [facility] Providers Cardiologist:  Cheryl Monroe       Referring MD: Cheryl Gammon, MD   Chief Complaint  Patient presents with   Near Syncope   Dementia          History of Present Illness:    Cheryl Monroe is a 87 y.o. female with a hx of dementia,  HTN  The patient was seen in the emergency room on July 6 for an episode of presyncope.  She lives in a nursing home.  She has had some issues with urinary tract infection and urinary retention.  She is a DNR.  At the nursing home she was complaining of feeling weak.  They checked her blood pressure and found it to be low.  EMS later checked it and found normal.  She denies any fever There was a question of whether or not she was dehydrated.  Seen with her son. 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 Echo looked ok   Walks with assistance,  wheelchair most of the time  Walker at times , walks to the lunch room   Past Medical History:  Diagnosis Date   Alzheimer disease (HCC)    Balance problem 07/19/2020   Constipation 07/19/2020   Dementia without behavioral disturbance (HCC) 07/19/2020   MMSE 21/30 07/21/20   Fall    Osteoarthritis    Pyelonephritis    Spinal stenosis 07/19/2020   Weakness of left lower extremity 07/19/2020    History reviewed. No pertinent surgical history.  Current Medications: Current Meds  Medication Sig   acetaminophen (TYLENOL) 500 MG tablet Take 2 tablets (1,000 mg total) by mouth in the morning.   Cranberry (THERACRAN PO) Take 250 mg by mouth in the morning and at bedtime.   D-Mannose POWD Take 4 capsules by mouth daily. 99%   docusate sodium (COLACE) 100 MG capsule Take 100 mg by mouth 2 (two) times daily as needed for  mild constipation.   Emollient (CERAVE) LOTN Apply topically daily. Apply Cerave lotion to back and extremities   estradiol (ESTRING) 2 MG vaginal ring Place 2 mg vaginally every 3 (three) months. follow package directions   hydrALAZINE (APRESOLINE) 10 MG tablet Take 10 mg by mouth every 8 (eight) hours as needed.   ibuprofen (ADVIL) 200 MG tablet Take 400 mg by mouth 3 (three) times daily as needed for mild pain.   losartan (COZAAR) 25 MG tablet Take 1 tablet (25 mg total) by mouth daily.   nitrofurantoin (MACRODANTIN) 50 MG capsule Take 50 mg by mouth 2 (two) times daily.   senna (SENOKOT) 8.6 MG TABS tablet Take 1 tablet by mouth in the morning and at bedtime.   tamsulosin (FLOMAX) 0.4 MG CAPS capsule Take 0.4 mg by mouth at bedtime.   [DISCONTINUED] acetaminophen (TYLENOL) 500 MG tablet Take 1,000 mg by mouth 3 (three) times daily as needed.   [DISCONTINUED] losartan (COZAAR) 25 MG tablet Take 12.5 mg by mouth daily. Only given if SBP is 195 or greater.     Allergies:   Sulfa antibiotics   Social History   Socioeconomic History   Marital status: Legally Separated  Spouse name: Not on file   Number of children: 3   Years of education: Not on file   Highest education level: Some college, no degree  Occupational History   Not on file  Tobacco Use   Smoking status: Never   Smokeless tobacco: Never  Vaping Use   Vaping Use: Never used  Substance and Sexual Activity   Alcohol use: Not Currently   Drug use: Not Currently   Sexual activity: Not on file  Other Topics Concern   Not on file  Social History Narrative   Not on file   Social Determinants of Health   Financial Resource Strain: Not on file  Food Insecurity: Not on file  Transportation Needs: Not on file  Physical Activity: Not on file  Stress: Not on file  Social Connections: Not on file     Family History: The patient's family history includes ALS in her mother.  ROS:   Please see the history of present  illness.     All other systems reviewed and are negative.  EKGs/Labs/Other Studies Reviewed:    The following studies were reviewed today:   EKG:    Recent Labs: 02/05/2022: ALT 13 04/25/2022: BUN 13; Creatinine, Ser 0.89; Hemoglobin 13.7; Platelets 178; Potassium 4.4; Sodium 139; TSH 0.942  Recent Lipid Panel    Component Value Date/Time   CHOL 235 (A) 07/20/2020 0000   TRIG 102 07/20/2020 0000   HDL 100 (A) 07/20/2020 0000   LDLCALC 134 07/20/2020 0000     Risk Assessment/Calculations:           Physical Exam:    VS:  BP 138/80   Pulse (!) 54   Ht 5\' 2"  (1.575 m)   Wt 145 lb (65.8 kg)   SpO2 92%   BMI 26.52 kg/m     Wt Readings from Last 3 Encounters:  05/14/22 145 lb (65.8 kg)  04/25/22 148 lb (67.1 kg)  04/18/22 148 lb (67.1 kg)     GEN:  Well nourished, well developed in no acute distress,  pleasantly demented . HEENT: Normal NECK: No JVD; No carotid bruits LYMPHATICS: No lymphadenopathy CARDIAC: RRR, no murmurs, rubs, gallops RESPIRATORY:  Clear to auscultation without rales, wheezing or rhonchi  ABDOMEN: Soft, non-tender, non-distended MUSCULOSKELETAL:  No edema; No deformity  SKIN: Warm and dry NEUROLOGIC:  Alert and oriented x 3 PSYCHIATRIC:  Normal affect   ASSESSMENT:    1. Essential hypertension    PLAN:    In order of problems listed above:  Hypertension:    It has been very difficult to effectively manage patient's blood pressure.  She cannot tell the nursing staff when she is having any problems.  She does not drink much water on a regular basis.  Should she does not always eat her meals.  She has episodes when her blood pressure will be high and at other times her blood pressure is quite low.  We do not have any information regarding how accurate these recordings are.  I talked to her son Cheryl Monroe, and he fully understands that it will be impossible for [place] to maintain very tight control.  I think it would be reasonable to increase the losartan  to 25 mg a day.  Will concentrate on patient safety  We will continue to encourage her to drink water.  1 idea would be to give her flavored water or perhaps low carbohydrate Gatorade.  This might help her oral intake.  I do not think it would  be safe or wise to try to achieve tight blood pressure control as she has too many episodes of hypotension.  Great discussion  with her son, Teralyn Mullins ( retina Monroe at [facility]) He has a clear understanding of our goals  We will see her on on an as-needed basis.          Medication Adjustments/Labs and Tests Ordered: Current medicines are reviewed at length with the patient today.  Concerns regarding medicines are outlined above.  No orders of the defined types were placed in this encounter.  Meds ordered this encounter  Medications   losartan (COZAAR) 25 MG tablet    Sig: Take 1 tablet (25 mg total) by mouth daily.    Dispense:  90 tablet    Refill:  3    Patient Instructions  Medication Instructions:  Your physician has recommended you make the following change in your medication:  Increased Losartan to 25 mg daily  *If you need a refill on your cardiac medications before your next appointment, please call your pharmacy*   Follow-Up: At [facility], you and your health needs are our priority.  As part of our continuing mission to provide you with exceptional heart care, we have created designated Provider Care Teams.  These Care Teams include your primary Cardiologist (physician) and Advanced Practice Providers (APPs -  Physician Assistants and Nurse Practitioners) who all work together to provide you with the care you need, when you need it.  We recommend signing up for the patient portal called "MyChart".  Sign up information is provided on this After Visit Summary.  MyChart is used to connect with patients for Virtual Visits (Telemedicine).  Patients are able to view lab/test results, encounter notes, upcoming appointments, etc.   Non-urgent messages can be sent to your provider as well.   To learn more about what you can do with MyChart, go to [email or web address].    Your next appointment:   As needed  Provider:   Dr. Melburn Popper          Signed, Kristeen Miss, MD  05/14/2022 6:05 PM    [facility]

## 2022-05-20 ENCOUNTER — Non-Acute Institutional Stay (SKILLED_NURSING_FACILITY): Payer: Medicare Other | Admitting: Internal Medicine

## 2022-05-20 ENCOUNTER — Encounter: Payer: Self-pay | Admitting: Internal Medicine

## 2022-05-20 DIAGNOSIS — N319 Neuromuscular dysfunction of bladder, unspecified: Secondary | ICD-10-CM

## 2022-05-20 DIAGNOSIS — I10 Essential (primary) hypertension: Secondary | ICD-10-CM

## 2022-05-20 DIAGNOSIS — N390 Urinary tract infection, site not specified: Secondary | ICD-10-CM

## 2022-05-20 DIAGNOSIS — F0280 Dementia in other diseases classified elsewhere without behavioral disturbance: Secondary | ICD-10-CM

## 2022-05-20 DIAGNOSIS — G301 Alzheimer's disease with late onset: Secondary | ICD-10-CM

## 2022-05-20 DIAGNOSIS — I951 Orthostatic hypotension: Secondary | ICD-10-CM

## 2022-05-20 NOTE — Progress Notes (Signed)
Location:    [facility] Room Number: 140 Place of Service:  SNF [phone]) Provider:  Einar Crow MD  Mahlon Gammon, MD  Patient Care Team: Mahlon Gammon, MD as PCP - General (Internal Medicine) Jamison Neighbor, MD (Urology)  Extended Emergency Contact Information Primary Emergency Contact: Dubree,[name] Address: [address]          [place], [place] [address] Darden Amber of [place] Home Phone: [phone] Work Phone: [phone] Relation: Son Secondary Emergency Contact: Stark Bray of [place] Home Phone: [phone] Work Phone: [phone] Mobile Phone: [phone] Relation: Son  Code Status:  DNR Goals of care: Advanced Directive information    05/20/2022   11:22 AM  Advanced Directives  Does Patient Have a Medical Advance Directive? Yes  Type of Advance Directive Living will;Out of facility DNR (pink MOST or yellow form)  Does patient want to make changes to medical advance directive? No - Patient declined     Chief Complaint  Patient presents with   Medical Management of Chronic Issues   Quality Metric Gaps    Verified matrix and [facility] patient is due for Shingrix    HPI:  Pt is a 87 y.o. female seen today for medical management of chronic diseases.    Lives in SNF  Patient has a history of Alzheimer's dementia with Aphasia, hypertension, orthostatic hypotension She also has history of recurrent UTIs and urinary incontinence  Also h/o Orthostatic BP with Wide variation SBP Can vary from 170-90  Had Syncope Few weeks ago Seen in ED thought to be due to orthostatic Hypotension Was seen by Cardiology  No New orders  Patient is now mostly Wheelchair Bound and not walking as her BP drops when she stands up. And she falls  Otherwise she is doing well  She is stable. No new Nursing issues. No Behavior issues Her weight is stable  Wt Readings from Last 3 Encounters:  05/20/22 145 lb 9.6  oz (66 kg)  05/14/22 145 lb (65.8 kg)  04/25/22 148 lb (67.1 kg)  Very pleasant Has aphasia    Past Medical History:  Diagnosis Date   Alzheimer disease (HCC)    Balance problem 07/19/2020   Constipation 07/19/2020   Dementia without behavioral disturbance (HCC) 07/19/2020   MMSE 21/30 07/21/20   Fall    Osteoarthritis    Pyelonephritis    Spinal stenosis 07/19/2020   Weakness of left lower extremity 07/19/2020   History reviewed. No pertinent surgical history.  Allergies  Allergen Reactions   Sulfa Antibiotics Rash    Rash to trunk, legs, neck, scalp, and arms    Allergies as of 05/20/2022       Reactions   Sulfa Antibiotics Rash   Rash to trunk, legs, neck, scalp, and arms        Medication List        Accurate as of May 20, 2022 11:22 AM. If you have any questions, ask your nurse or doctor.          acetaminophen 500 MG tablet Commonly known as: TYLENOL Take 500 mg by mouth 3 (three) times daily as needed.   acetaminophen 500 MG tablet Commonly known as: TYLENOL Take 2 tablets (1,000 mg total) by mouth in the morning.   CeraVe Lotn Apply topically daily. Apply Cerave lotion to back and extremities   D-Mannose Powd Take 4 capsules by mouth daily. 99%   docusate sodium 100 MG capsule Commonly  known as: COLACE Take 100 mg by mouth 2 (two) times daily as needed for mild constipation.   estradiol 2 MG vaginal ring Commonly known as: ESTRING Place 2 mg vaginally every 3 (three) months. follow package directions   hydrALAZINE 10 MG tablet Commonly known as: APRESOLINE Take 10 mg by mouth every 8 (eight) hours as needed.   ibuprofen 200 MG tablet Commonly known as: ADVIL Take 400 mg by mouth 3 (three) times daily as needed for mild pain.   losartan 25 MG tablet Commonly known as: COZAAR Take 1 tablet (25 mg total) by mouth daily.   mupirocin ointment 2 % Commonly known as: BACTROBAN Apply 1 Application topically 2 (two) times daily.    nitrofurantoin 50 MG capsule Commonly known as: MACRODANTIN Take 50 mg by mouth 2 (two) times daily.   senna 8.6 MG Tabs tablet Commonly known as: SENOKOT Take 1 tablet by mouth in the morning and at bedtime.   tamsulosin 0.4 MG Caps capsule Commonly known as: FLOMAX Take 0.4 mg by mouth at bedtime.   THERACRAN PO Take 250 mg by mouth in the morning and at bedtime.        Review of Systems  Unable to perform ROS: Dementia    Immunization History  Administered Date(s) Administered   Influenza, High Dose Seasonal PF 07/25/2021   Influenza-Unspecified 08/05/2018, 09/29/2018, 07/02/2019, 08/21/2019, 08/11/2020   Moderna Covid-19 Vaccine Bivalent Booster 83yrs & up 08/01/2021   Moderna SARS-COV2 Booster Vaccination 08/31/2020, 06/12/2021   Moderna Sars-Covid-2 Vaccination 11/04/2019, 12/02/2019   Pneumococcal Conjugate-13 09/28/2018   Pneumococcal Polysaccharide-23 10/21/2017   Tdap 02/23/2021   Zoster Recombinat (Shingrix) 05/24/2021   Pertinent  Health Maintenance Due  Topic Date Due   INFLUENZA VACCINE  05/21/2022   DEXA SCAN  Discontinued      08/04/2021   12:00 AM 08/04/2021   10:39 AM 08/05/2021    1:00 AM 10/19/2021    1:02 PM 04/25/2022   11:04 AM  Fall Risk  Falls in the past year?    0   Was there an injury with Fall?    0   Fall Risk Category Calculator    0   Fall Risk Category    Low   Patient Fall Risk Level High fall risk High fall risk High fall risk Moderate fall risk High fall risk  Patient at Risk for Falls Due to    Impaired balance/gait   Fall risk Follow up    Falls evaluation completed    Functional Status Survey:    Vitals:   05/20/22 1100  BP: 136/82  Pulse: 65  Resp: 18  Temp: (!) 97.2 F (36.2 C)  SpO2: 99%  Weight: 145 lb 9.6 oz (66 kg)  Height: 5\' 2"  (1.575 m)   Body mass index is 26.63 kg/m. Physical Exam Vitals reviewed.  Constitutional:      Appearance: Normal appearance.  HENT:     Head: Normocephalic.     Nose:  Nose normal.     Mouth/Throat:     Mouth: Mucous membranes are moist.     Pharynx: Oropharynx is clear.  Eyes:     Pupils: Pupils are equal, round, and reactive to light.  Cardiovascular:     Rate and Rhythm: Normal rate and regular rhythm.     Pulses: Normal pulses.     Heart sounds: Normal heart sounds. No murmur heard. Pulmonary:     Effort: Pulmonary effort is normal.     Breath sounds: Normal  breath sounds.  Abdominal:     General: Abdomen is flat. Bowel sounds are normal.     Palpations: Abdomen is soft.  Musculoskeletal:        General: No swelling.     Cervical back: Neck supple.  Skin:    General: Skin is warm.  Neurological:     General: No focal deficit present.     Mental Status: She is alert.  Psychiatric:        Mood and Affect: Mood normal.        Thought Content: Thought content normal.     Labs reviewed: Recent Labs    08/02/21 1926 08/03/21 0511 08/14/21 0000 02/05/22 0000 04/19/22 0000 04/25/22 1154  NA 135 135   < > 140 139 139  K 3.8 3.6   < > 4.0 4.3 4.4  CL 102 101   < > 106 103 104  CO2 24 25   < > 28* 26* 26  GLUCOSE 107* 91  --   --   --  113*  BUN 8 8   < > 13 14 13   CREATININE 0.77 0.73   < > 0.7 0.8 0.89  CALCIUM 9.2 8.3*   < > 9.2 9.4 9.2   < > = values in this interval not displayed.   Recent Labs    06/28/21 0000 08/02/21 1926 02/05/22 0000  AST 17 26 15   ALT 12 18 13   ALKPHOS 98 80 80  BILITOT  --  0.9  --   PROT  --  7.0  --   ALBUMIN 3.9 3.5 3.7   Recent Labs    08/02/21 1926 08/03/21 0511 02/05/22 0000 04/19/22 0000 04/25/22 1154  WBC 5.4 4.8 6.5 5.9 6.9  NEUTROABS 4.0  --  3.50  --  4.4  HGB 14.3 12.5 13.2 13.6 13.7  HCT 45.7 38.4 40 40 42.1  MCV 90.0 88.5  --   --  89.6  PLT 169 151 197 198 178   Lab Results  Component Value Date   TSH 0.942 04/25/2022   No results found for: "HGBA1C" Lab Results  Component Value Date   CHOL 235 (A) 07/20/2020   HDL 100 (A) 07/20/2020   LDLCALC 134 07/20/2020    TRIG 102 07/20/2020    Significant Diagnostic Results in last 30 days:  DG Chest Portable 1 View  Result Date: 04/25/2022 CLINICAL DATA:  weakness EXAM: PORTABLE CHEST 1 VIEW COMPARISON:  None Available. FINDINGS: Heart is borderline. Thoracic aorta is ectatic. There is rightward deviation of the trachea seen with an apparent soft tissue attenuation at the thoracic inlet. Both lungs are clear. The visualized skeletal structures are unremarkable. IMPRESSION: Heart is border line. Lungs are clear. Rightward deviation of the trachea with an apparent soft tissue attenuation of likely prominent innominate artery or thyroid enlargement. Clinical and ultrasonographic correlation is suggested. Electronically Signed   By: 07/22/2020 M.D.   On: 04/25/2022 11:36    Assessment/Plan 1. Essential hypertension Loose Control due to Orthostatic Hypotension and Syncope  2. Orthostatic hypotension Now stays in Wheelchair mostly  3. Neurogenic bladder On Flomax  4. Late onset Alzheimer's dementia without behavioral disturbance (HCC) MRI showed 08/29/21 revealed no evidence of acute intracranial abnormality, Moderate chronic small vessel ischemic changes within the cerebral white matter. Continue Supportive care Has not tolerated any meds before MMSE 11/30 but her main issue is Aphasia Supportive care  5. Recurrent urinary tract infection Continue Macrodantin    Family/ staff  Communication:   Labs/tests ordered:

## 2022-06-03 ENCOUNTER — Emergency Department (HOSPITAL_BASED_OUTPATIENT_CLINIC_OR_DEPARTMENT_OTHER): Payer: Medicare Other

## 2022-06-03 ENCOUNTER — Emergency Department (HOSPITAL_BASED_OUTPATIENT_CLINIC_OR_DEPARTMENT_OTHER)
Admission: EM | Admit: 2022-06-03 | Discharge: 2022-06-03 | Disposition: A | Discharge status: Home / Self Care | Payer: Medicare Other | Attending: Emergency Medicine | Admitting: Emergency Medicine

## 2022-06-03 ENCOUNTER — Encounter (HOSPITAL_BASED_OUTPATIENT_CLINIC_OR_DEPARTMENT_OTHER): Payer: Self-pay | Admitting: Emergency Medicine

## 2022-06-03 DIAGNOSIS — S0083XA Contusion of other part of head, initial encounter: Secondary | ICD-10-CM | POA: Insufficient documentation

## 2022-06-03 DIAGNOSIS — W050XXA Fall from non-moving wheelchair, initial encounter: Secondary | ICD-10-CM | POA: Diagnosis not present

## 2022-06-03 DIAGNOSIS — Y92480 Sidewalk as the place of occurrence of the external cause: Secondary | ICD-10-CM | POA: Diagnosis not present

## 2022-06-03 DIAGNOSIS — S0990XA Unspecified injury of head, initial encounter: Secondary | ICD-10-CM | POA: Diagnosis present

## 2022-06-03 DIAGNOSIS — G20 Parkinson's disease: Secondary | ICD-10-CM | POA: Diagnosis not present
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
## 2022-06-03 NOTE — Discharge Instructions (Signed)
Return for confusion, vomiting, worsening headache.   Happy birthday!

## 2022-06-17 ENCOUNTER — Non-Acute Institutional Stay (SKILLED_NURSING_FACILITY): Payer: Medicare Other | Admitting: Adult Health

## 2022-06-17 DIAGNOSIS — N319 Neuromuscular dysfunction of bladder, unspecified: Secondary | ICD-10-CM

## 2022-06-17 DIAGNOSIS — I951 Orthostatic hypotension: Secondary | ICD-10-CM

## 2022-06-17 DIAGNOSIS — F0390 Unspecified dementia without behavioral disturbance: Secondary | ICD-10-CM

## 2022-06-17 DIAGNOSIS — I10 Essential (primary) hypertension: Secondary | ICD-10-CM

## 2022-06-17 DIAGNOSIS — K5901 Slow transit constipation: Secondary | ICD-10-CM | POA: Diagnosis not present

## 2022-06-17 DIAGNOSIS — N390 Urinary tract infection, site not specified: Secondary | ICD-10-CM

## 2022-06-17 DIAGNOSIS — K644 Residual hemorrhoidal skin tags: Secondary | ICD-10-CM

## 2022-06-17 DIAGNOSIS — E010 Iodine-deficiency related diffuse (endemic) goiter: Secondary | ICD-10-CM

## 2022-06-18 ENCOUNTER — Telehealth: Payer: Self-pay | Admitting: Cardiovascular Disease

## 2022-06-18 ENCOUNTER — Encounter: Payer: Self-pay | Admitting: Adult Health

## 2022-06-18 MED ORDER — HYDROCORTISONE (PERIANAL) 2.5 % EX CREA: 1.0000 | TOPICAL_CREAM | Freq: Two times a day (BID) | CUTANEOUS | 0 refills | Status: AC

## 2022-06-18 MED ORDER — POLYETHYLENE GLYCOL 3350 17 GM/SCOOP PO POWD: 17.0000 g | ORAL | 0 refills | Status: AC

## 2022-06-18 NOTE — Telephone Encounter (Signed)
[facility] called to verify instructions for medication

## 2022-06-18 NOTE — Progress Notes (Unsigned)
Location:  Medical illustrator of Service:  SNF (31) Provider:   Peggye Ley, ANP [facility] [phone]   Mahlon Gammon, MD  Patient Care Team: Mahlon Gammon, MD as PCP - General (Internal Medicine) Jamison Neighbor, MD (Urology)  Extended Emergency Contact Information Primary Emergency Contact: Kostka,[name] Address: [address]          [place], [place] [address] [place] of [place] Home Phone: [phone] Work Phone: [phone] Relation: Son Secondary Emergency Contact: Mary Sella [place] of [place] Home Phone: [phone] Work Phone: [phone] Mobile Phone: [phone] Relation: Son  Code Status:   Goals of care: Advanced Directive information    05/20/2022   11:22 AM  Advanced Directives  Does Patient Have a Medical Advance Directive? Yes  Type of Advance Directive Living will;Out of facility DNR (pink MOST or yellow form)  Does patient want to make changes to medical advance directive? No - Patient declined     Chief Complaint  Patient presents with   Acute Visit    Mucus and blood from rectum    HPI:  Pt is a 87 y.o. female seen today for acute concern of blood and mucus in rectum and medical management of chronic diseases.    Her son called the nurse ans stated they saw mucus and blood tinge in the toilet last evening. No stool present at the time. She is not having any abd pain, nausea, fever. LBM 8/27. BMs are recorded as "small" frequently in the records. She is taking senokot for constipation.  Remains on macrodantin for UTI prevention due to hx of recurrent UTI and has neurogenic bladder with retention, on flomax.  Hx of orthostatic hypotension and also hypertension.   BPs are quite variable, see below.   Blood Pressure: 103 / 63 mmHg   Blood Pressure: 120 / 59 mmHg   Blood Pressure: 173 / 83 mmHg  Blood Pressure: 183 / 91 mmHg  Blood Pressure: 198 / 89 mmHg  Blood  Pressure: 158 / 85 mmHg   Blood Pressure: 168 / 94 mmHg  Blood Pressure: 75 / 60 mmHg  No reports of passing out. Did have a mechanical fall and went to the ED 06/03/22 and hit her head. CT was done which showed no acute bleed, atrophy with small vessel chronic ischemic changes were noted. NO cervical fracture noted.  She saw cardiology on 7/25 and they recommend loose bp control due to labile bp. She was started on losartan 25.   She is current receiving fluicinonide for a rash to her left cheek from dermatology.   She also went to the ER 04/25/22 due to near syncope and low bp. She was given IVF. No sign of UTI and had normal EKG. Was found to have evidence of thyromegaly as the trachea was pushed over slightly on CXR. TSH 0.943 Free T4 1.14 Past Medical History:  Diagnosis Date   Alzheimer disease (HCC)    Balance problem 07/19/2020   Constipation 07/19/2020   Dementia without behavioral disturbance (HCC) 07/19/2020   MMSE 21/30 07/21/20   Fall    Osteoarthritis    Pyelonephritis    Spinal stenosis 07/19/2020   Weakness of left lower extremity 07/19/2020   History reviewed. No pertinent surgical history.  Allergies  Allergen Reactions   Sulfa Antibiotics Rash    Rash to trunk, legs, neck, scalp, and arms    Outpatient Encounter Medications as of 06/17/2022  Medication  Sig   hydrocortisone (ANUSOL-HC) 2.5 % rectal cream Place 1 Application rectally 2 (two) times daily for 5 days.   polyethylene glycol powder (GLYCOLAX/MIRALAX) 17 GM/SCOOP powder Take 17 g by mouth every other day. Hold for loose stools   acetaminophen (TYLENOL) 500 MG tablet Take 2 tablets (1,000 mg total) by mouth in the morning.   acetaminophen (TYLENOL) 500 MG tablet Take 500 mg by mouth 3 (three) times daily as needed.   Cranberry (THERACRAN PO) Take 250 mg by mouth in the morning and at bedtime.   D-Mannose POWD Take 4 capsules by mouth daily. 99%   docusate sodium (COLACE) 100 MG capsule Take 100 mg by mouth 2  (two) times daily as needed for mild constipation.   Emollient (CERAVE) LOTN Apply topically daily. Apply Cerave lotion to back and extremities   estradiol (ESTRING) 2 MG vaginal ring Place 2 mg vaginally every 3 (three) months. follow package directions   hydrALAZINE (APRESOLINE) 10 MG tablet Take 10 mg by mouth every 8 (eight) hours as needed.   ibuprofen (ADVIL) 200 MG tablet Take 400 mg by mouth 3 (three) times daily as needed for mild pain.   losartan (COZAAR) 25 MG tablet Take 1 tablet (25 mg total) by mouth daily.   mupirocin ointment (BACTROBAN) 2 % Apply 1 Application topically 2 (two) times daily.   nitrofurantoin (MACRODANTIN) 50 MG capsule Take 50 mg by mouth 2 (two) times daily.   senna (SENOKOT) 8.6 MG TABS tablet Take 1 tablet by mouth in the morning and at bedtime.   tamsulosin (FLOMAX) 0.4 MG CAPS capsule Take 0.4 mg by mouth at bedtime.   No facility-administered encounter medications on file as of 06/17/2022.    Review of Systems  Unable to perform ROS: Dementia    Immunization History  Administered Date(s) Administered   Influenza, High Dose Seasonal PF 07/25/2021   Influenza-Unspecified 08/05/2018, 09/29/2018, 07/02/2019, 08/21/2019, 08/11/2020   Moderna Covid-19 Vaccine Bivalent Booster 66yrs & up 08/01/2021   Moderna SARS-COV2 Booster Vaccination 08/31/2020, 06/12/2021   Moderna Sars-Covid-2 Vaccination 11/04/2019, 12/02/2019   Pneumococcal Conjugate-13 09/28/2018   Pneumococcal Polysaccharide-23 10/21/2017   Tdap 02/23/2021   Zoster Recombinat (Shingrix) 05/24/2021   Pertinent  Health Maintenance Due  Topic Date Due   INFLUENZA VACCINE  05/21/2022   DEXA SCAN  Discontinued      08/04/2021   10:39 AM 08/05/2021    1:00 AM 10/19/2021    1:02 PM 04/25/2022   11:04 AM 06/03/2022    3:33 PM  Fall Risk  Falls in the past year?   0    Was there an injury with Fall?   0    Fall Risk Category Calculator   0    Fall Risk Category   Low    Patient Fall Risk  Level High fall risk High fall risk Moderate fall risk High fall risk High fall risk  Patient at Risk for Falls Due to   Impaired balance/gait    Fall risk Follow up   Falls evaluation completed     Functional Status Survey:    Vitals:   06/18/22 0800  BP: 103/63  Pulse: 76  Resp: 18  Temp: (!) 97.2 F (36.2 C)  SpO2: 91%  Weight: 144 lb 12.8 oz (65.7 kg)   Body mass index is 26.48 kg/m. Physical Exam Vitals and nursing note reviewed.  Constitutional:      General: She is not in acute distress.    Appearance: She is not diaphoretic.  HENT:     Head: Normocephalic and atraumatic.     Mouth/Throat:     Mouth: Mucous membranes are moist.     Pharynx: Oropharynx is clear.  Neck:     Thyroid: Thyromegaly present. No thyroid mass or thyroid tenderness.     Vascular: No JVD.  Cardiovascular:     Rate and Rhythm: Normal rate and regular rhythm.     Heart sounds: No murmur heard. Pulmonary:     Effort: Pulmonary effort is normal. No respiratory distress.     Breath sounds: Normal breath sounds. No wheezing.  Abdominal:     General: Bowel sounds are normal. There is no distension.     Palpations: Abdomen is soft.     Tenderness: There is no abdominal tenderness.  Genitourinary:    Comments: Rectum with small hemorrhoid. NO bleeding currently, no stool in rectal vault.  Musculoskeletal:     Cervical back: No rigidity or tenderness.     Right lower leg: No edema.     Left lower leg: No edema.  Lymphadenopathy:     Cervical: No cervical adenopathy.  Skin:    General: Skin is warm and dry.  Neurological:     General: No focal deficit present.     Mental Status: She is alert. Mental status is at baseline.  Psychiatric:        Mood and Affect: Mood normal.    Labs reviewed: Recent Labs    08/02/21 1926 08/03/21 0511 08/14/21 0000 02/05/22 0000 04/19/22 0000 04/25/22 1154  NA 135 135   < > 140 139 139  K 3.8 3.6   < > 4.0 4.3 4.4  CL 102 101   < > 106 103 104   CO2 24 25   < > 28* 26* 26  GLUCOSE 107* 91  --   --   --  113*  BUN 8 8   < > 13 14 13   CREATININE 0.77 0.73   < > 0.7 0.8 0.89  CALCIUM 9.2 8.3*   < > 9.2 9.4 9.2   < > = values in this interval not displayed.   Recent Labs    06/28/21 0000 08/02/21 1926 02/05/22 0000  AST 17 26 15   ALT 12 18 13   ALKPHOS 98 80 80  BILITOT  --  0.9  --   PROT  --  7.0  --   ALBUMIN 3.9 3.5 3.7   Recent Labs    08/02/21 1926 08/03/21 0511 02/05/22 0000 04/19/22 0000 04/25/22 1154  WBC 5.4 4.8 6.5 5.9 6.9  NEUTROABS 4.0  --  3.50  --  4.4  HGB 14.3 12.5 13.2 13.6 13.7  HCT 45.7 38.4 40 40 42.1  MCV 90.0 88.5  --   --  89.6  PLT 169 151 197 198 178   Lab Results  Component Value Date   TSH 0.942 04/25/2022   No results found for: "HGBA1C" Lab Results  Component Value Date   CHOL 235 (A) 07/20/2020   HDL 100 (A) 07/20/2020   LDLCALC 134 07/20/2020   TRIG 102 07/20/2020    Significant Diagnostic Results in last 30 days:  CT Head Wo Contrast  Result Date: 06/03/2022 CLINICAL DATA:  Fall striking head, denies loss of consciousness, history of Alzheimer's EXAM: CT HEAD WITHOUT CONTRAST CT CERVICAL SPINE WITHOUT CONTRAST TECHNIQUE: Multidetector CT imaging of the head and cervical spine was performed following the standard protocol without intravenous contrast. Multiplanar CT image reconstructions of the cervical  spine were also generated. RADIATION DOSE REDUCTION: This exam was performed according to the departmental dose-optimization program which includes automated exposure control, adjustment of the mA and/or kV according to patient size and/or use of iterative reconstruction technique. COMPARISON:  CT head 08/02/2021 FINDINGS: CT HEAD FINDINGS Brain: Generalized atrophy. Normal ventricular morphology. No midline shift or mass effect. Small vessel chronic ischemic changes of deep cerebral white matter. No intracranial hemorrhage, mass lesion, evidence of acute infarction, or extra-axial  fluid collection. Vascular: Extensive atherosclerotic calcifications of internal carotid arteries and basilar artery with basilar dolichoectasia. Skull: Intact.  Small posterior scalp hematoma. Sinuses/Orbits: Clear Other: N/A CT CERVICAL SPINE FINDINGS Alignment: Normal Skull base and vertebrae: Osseous demineralization. Visualized skull base intact. Vertebral body heights maintained. Multilevel facet degenerative changes. Disc space narrowing with endplate spur formation at C5-C6. Additional endplate spur formation C4-C5 and C6-C7. No acute fracture, subluxation, or bone destruction. Soft tissues and spinal canal: Prevertebral soft tissues normal thickness. Extensive basilar artery can calcification. Fluid and air within distended upper thoracic esophagus. Asymmetric enlargement of LEFT thyroid lobe versus RIGHT without discrete mass. Disc levels:  No specific abnormality Upper chest: Lung apices clear Other: N/A IMPRESSION: Atrophy with small vessel chronic ischemic changes of deep cerebral white matter. No acute intracranial abnormalities. Degenerative disc and facet disease changes of the cervical spine. No acute cervical spine abnormalities. Extensive atherosclerotic calcifications of internal carotid arteries and basilar artery at skull base with basilar dolichoectasia. Electronically Signed   By: Ulyses Southward M.D.   On: 06/03/2022 16:48   CT Cervical Spine Wo Contrast  Result Date: 06/03/2022 CLINICAL DATA:  Fall striking head, denies loss of consciousness, history of Alzheimer's EXAM: CT HEAD WITHOUT CONTRAST CT CERVICAL SPINE WITHOUT CONTRAST TECHNIQUE: Multidetector CT imaging of the head and cervical spine was performed following the standard protocol without intravenous contrast. Multiplanar CT image reconstructions of the cervical spine were also generated. RADIATION DOSE REDUCTION: This exam was performed according to the departmental dose-optimization program which includes automated exposure  control, adjustment of the mA and/or kV according to patient size and/or use of iterative reconstruction technique. COMPARISON:  CT head 08/02/2021 FINDINGS: CT HEAD FINDINGS Brain: Generalized atrophy. Normal ventricular morphology. No midline shift or mass effect. Small vessel chronic ischemic changes of deep cerebral white matter. No intracranial hemorrhage, mass lesion, evidence of acute infarction, or extra-axial fluid collection. Vascular: Extensive atherosclerotic calcifications of internal carotid arteries and basilar artery with basilar dolichoectasia. Skull: Intact.  Small posterior scalp hematoma. Sinuses/Orbits: Clear Other: N/A CT CERVICAL SPINE FINDINGS Alignment: Normal Skull base and vertebrae: Osseous demineralization. Visualized skull base intact. Vertebral body heights maintained. Multilevel facet degenerative changes. Disc space narrowing with endplate spur formation at C5-C6. Additional endplate spur formation C4-C5 and C6-C7. No acute fracture, subluxation, or bone destruction. Soft tissues and spinal canal: Prevertebral soft tissues normal thickness. Extensive basilar artery can calcification. Fluid and air within distended upper thoracic esophagus. Asymmetric enlargement of LEFT thyroid lobe versus RIGHT without discrete mass. Disc levels:  No specific abnormality Upper chest: Lung apices clear Other: N/A IMPRESSION: Atrophy with small vessel chronic ischemic changes of deep cerebral white matter. No acute intracranial abnormalities. Degenerative disc and facet disease changes of the cervical spine. No acute cervical spine abnormalities. Extensive atherosclerotic calcifications of internal carotid arteries and basilar artery at skull base with basilar dolichoectasia. Electronically Signed   By: Ulyses Southward M.D.   On: 06/03/2022 16:48    Assessment/Plan 1. External hemorrhoid  - hydrocortisone (ANUSOL-HC) 2.5 %  rectal cream; Place 1 Application rectally 2 (two) times daily for 5 days.   Dispense: 30 g; Refill: 0 May substitute  2. Slow transit constipation  - polyethylene glycol powder (GLYCOLAX/MIRALAX) 17 GM/SCOOP powder; Take 17 g by mouth every other day. Hold for loose stools  Dispense: 255 g; Refill: 0  3. Dementia without behavioral disturbance (HCC) No issues with behaviors.  Has word finding difficulty Continues to benefit from skilled care Did not tolerate memory meds in the past.   4. Essential hypertension Doing fairly well with losartan considering past hx of labile bp  5. Recurrent UTI No current symptoms On macrodantin  6. Neurogenic bladder Continue flomax and macrodantin per urology   7. Orthostatic hypotension Encourage oral intake Avoid rising quickly, assist with transfers Loose bp control Compression hose.   8. Thyromegaly This was noted on prior CXR in ED. Recent CT of the head and neck does not show a nodule or other concern. Left lobe is enlarged. TSH studies are normal.  Family/ staff Communication: nurse  Labs/tests ordered:  NA

## 2022-06-18 NOTE — Telephone Encounter (Signed)
Spoke with [facility] to clarify Losartan 25 mg daily order.

## 2022-06-20 ENCOUNTER — Telehealth: Payer: Self-pay

## 2022-06-20 NOTE — Telephone Encounter (Signed)
Sarah with [facility] called and stated that they received a fax from [facility] regarding patient's BP and that blood pressure medication has not been administered since 05/14/22. Maralyn Sago stated that Dr. Melburn Popper feels that he should no longer be managing this for the patient. He feels that one of our providers should manage this.  Message routed to Dr. Einar Crow

## 2022-06-20 NOTE — Telephone Encounter (Signed)
Received fax stating pt had not been given her Losartan since 05/14/22 due to dose change and them needing a new order. The RX from this visit was sent to [facility] pharmacy services in [place]. OV note from 05/14/22 shows: Hypertension:    It has been very difficult to effectively manage patient's blood pressure.  She cannot tell the nursing staff when she is having any problems.  She does not drink much water on a regular basis.  Should she does not always eat her meals.   She has episodes when her blood pressure will be high and at other times her blood pressure is quite low.  We do not have any information regarding how accurate these recordings are.  I talked to her son Cheryl Monroe, and he fully understands that it will be impossible for [place] to maintain very tight control.  I think it would be reasonable to increase the losartan to 25 mg a day.   Will concentrate on patient safety   Spoke with Dr Elease Hashimoto about this. Previous rx had administration parameters stating to give only if SBP>195. He advised that at this time, we cannot effectively or safely manage her BP outpatient. Someone at the facility Fletcher Anon, NP or Dr Chales Abrahams needs to take over managing her pressure. The nurses there are checking and recording BP and they can best make clinical decision and medication adjustment as necessary. Called facility to relay information. Spoke with Evie who stated she could help me, took detailed message, and said they would address it there.

## 2022-06-26 NOTE — Telephone Encounter (Signed)
I am Taking care of this

## 2022-07-01 ENCOUNTER — Non-Acute Institutional Stay (SKILLED_NURSING_FACILITY): Payer: Medicare Other | Admitting: Internal Medicine

## 2022-07-01 ENCOUNTER — Encounter: Payer: Self-pay | Admitting: Internal Medicine

## 2022-07-01 DIAGNOSIS — N390 Urinary tract infection, site not specified: Secondary | ICD-10-CM | POA: Diagnosis not present

## 2022-07-01 DIAGNOSIS — R0989 Other specified symptoms and signs involving the circulatory and respiratory systems: Secondary | ICD-10-CM | POA: Diagnosis not present

## 2022-07-01 DIAGNOSIS — F0390 Unspecified dementia without behavioral disturbance: Secondary | ICD-10-CM | POA: Diagnosis not present

## 2022-07-01 DIAGNOSIS — N319 Neuromuscular dysfunction of bladder, unspecified: Secondary | ICD-10-CM

## 2022-07-01 NOTE — Progress Notes (Signed)
[facility] Room Number: 140-A Place of Service:  SNF [phone]) Provider:  Einar Crow, MD    Patient Care Team: Mahlon Gammon, MD as PCP - General (Internal Medicine) Jamison Neighbor, MD (Urology)  Extended Emergency Contact Information Primary Emergency Contact: Nissley,[name] Address: [address]          [place], [place] [address] D[place] of [place] Home Phone: [phone] Work Phone: [phone] Relation: Son Secondary Emergency Contact: Mary Sella [place] of [place] Home Phone: [phone] Work Phone: [phone] Mobile Phone: [phone] Relation: Son  Code Status:  DNR Goals of care: Advanced Directive information    07/01/2022   11:30 AM  Advanced Directives  Does Patient Have a Medical Advance Directive? Yes  Type of Estate agent of [place];Living will;Out of facility DNR (pink MOST or yellow form)  Does patient want to make changes to medical advance directive? No - Patient declined  Copy of Healthcare Power of Attorney in Chart? No - copy requested     Chief Complaint  Patient presents with   Acute Visit    Blood pressure control.    HPI:  Pt is a 87 y.o. female seen today for an acute visit for BP control  Lives in SNF   Patient has a history of Alzheimer's dementia with Aphasia, hypertension, orthostatic hypotension She also has history of recurrent UTIs and urinary incontinence  Also h/o Orthostatic BP with Wide variation SBP Can vary from 170-90    Seen today for Management of BP Continues to be issue She gets Dizzy and now is in wheelchair   Past Medical History:  Diagnosis Date   Alzheimer disease (HCC)    Balance problem 07/19/2020   Constipation 07/19/2020   Dementia without behavioral disturbance (HCC) 07/19/2020   MMSE 21/30 07/21/20   Fall    Osteoarthritis    Pyelonephritis    Spinal stenosis 07/19/2020   Weakness of left lower  extremity 07/19/2020   History reviewed. No pertinent surgical history.  Allergies  Allergen Reactions   Sulfa Antibiotics Rash    Rash to trunk, legs, neck, scalp, and arms    Allergies as of 07/01/2022       Reactions   Sulfa Antibiotics Rash   Rash to trunk, legs, neck, scalp, and arms        Medication List        Accurate as of July 01, 2022 11:30 AM. If you have any questions, ask your nurse or doctor.          STOP taking these medications    D-Mannose Powd Stopped by: Mahlon Gammon, MD   fluocinonide cream 0.05 % Commonly known as: LIDEX Stopped by: Mahlon Gammon, MD       TAKE these medications    acetaminophen 500 MG tablet Commonly known as: TYLENOL Take 500 mg by mouth 3 (three) times daily as needed.   acetaminophen 500 MG tablet Commonly known as: TYLENOL Take 2 tablets (1,000 mg total) by mouth in the morning.   CeraVe Lotn Apply topically daily. Apply Cerave lotion to back and extremities   docusate sodium 100 MG capsule Commonly known as: COLACE Take 100 mg by mouth 2 (two) times daily as needed for mild constipation.   estradiol 2 MG vaginal ring Commonly known as: ESTRING Place 2 mg vaginally every 3 (three) months. follow package directions   hydrALAZINE 10 MG tablet Commonly known as: APRESOLINE  Take 10 mg by mouth every 8 (eight) hours as needed.   ibuprofen 200 MG tablet Commonly known as: ADVIL Take 400 mg by mouth 3 (three) times daily as needed for mild pain.   losartan 25 MG tablet Commonly known as: COZAAR Take 1 tablet (25 mg total) by mouth daily.   mupirocin ointment 2 % Commonly known as: BACTROBAN Apply 1 Application topically 2 (two) times daily.   nitrofurantoin 50 MG capsule Commonly known as: MACRODANTIN Take 50 mg by mouth 2 (two) times daily.   polyethylene glycol powder 17 GM/SCOOP powder Commonly known as: GLYCOLAX/MIRALAX Take 17 g by mouth every other day. Hold for loose stools    senna 8.6 MG Tabs tablet Commonly known as: SENOKOT Take 1 tablet by mouth in the morning and at bedtime.   tamsulosin 0.4 MG Caps capsule Commonly known as: FLOMAX Take 0.4 mg by mouth at bedtime.   THERACRAN PO Take 250 mg by mouth in the morning and at bedtime.        Review of Systems  Unable to perform ROS: Dementia    Immunization History  Administered Date(s) Administered   Influenza, High Dose Seasonal PF 07/25/2021   Influenza-Unspecified 08/05/2018, 09/29/2018, 07/02/2019, 08/21/2019, 08/11/2020   Moderna Covid-19 Vaccine Bivalent Booster 58yrs & up 08/01/2021   Moderna SARS-COV2 Booster Vaccination 08/31/2020, 06/12/2021   Moderna Sars-Covid-2 Vaccination 11/04/2019, 12/02/2019   Pneumococcal Conjugate-13 09/28/2018   Pneumococcal Polysaccharide-23 10/21/2017   Tdap 02/23/2021   Zoster Recombinat (Shingrix) 05/24/2021   Pertinent  Health Maintenance Due  Topic Date Due   INFLUENZA VACCINE  05/21/2022   DEXA SCAN  Discontinued      08/04/2021   10:39 AM 08/05/2021    1:00 AM 10/19/2021    1:02 PM 04/25/2022   11:04 AM 06/03/2022    3:33 PM  Fall Risk  Falls in the past year?   0    Was there an injury with Fall?   0    Fall Risk Category Calculator   0    Fall Risk Category   Low    Patient Fall Risk Level High fall risk High fall risk Moderate fall risk High fall risk High fall risk  Patient at Risk for Falls Due to   Impaired balance/gait    Fall risk Follow up   Falls evaluation completed     Functional Status Survey:    Vitals:   07/01/22 1119  BP: (!) 96/58  Pulse: 70  Resp: 20  Temp: (!) 97.3 F (36.3 C)  SpO2: 96%  Weight: 145 lb (65.8 kg)  Height: 5\' 2"  (1.575 m)   Body mass index is 26.52 kg/m. Physical Exam Vitals reviewed.  Constitutional:      Appearance: Normal appearance.  HENT:     Head: Normocephalic.     Nose: Nose normal.     Mouth/Throat:     Mouth: Mucous membranes are moist.     Pharynx: Oropharynx is clear.   Eyes:     Pupils: Pupils are equal, round, and reactive to light.  Cardiovascular:     Rate and Rhythm: Normal rate and regular rhythm.     Pulses: Normal pulses.     Heart sounds: Normal heart sounds. No murmur heard. Pulmonary:     Effort: Pulmonary effort is normal.     Breath sounds: Normal breath sounds.  Abdominal:     General: Abdomen is flat. Bowel sounds are normal.     Palpations: Abdomen is soft.  Musculoskeletal:        General: No swelling.     Cervical back: Neck supple.  Skin:    General: Skin is warm.  Neurological:     General: No focal deficit present.     Mental Status: She is alert.  Psychiatric:        Mood and Affect: Mood normal.        Thought Content: Thought content normal.     Labs reviewed: Recent Labs    08/02/21 1926 08/03/21 0511 08/14/21 0000 02/05/22 0000 04/19/22 0000 04/25/22 1154  NA 135 135   < > 140 139 139  K 3.8 3.6   < > 4.0 4.3 4.4  CL 102 101   < > 106 103 104  CO2 24 25   < > 28* 26* 26  GLUCOSE 107* 91  --   --   --  113*  BUN 8 8   < > 13 14 13   CREATININE 0.77 0.73   < > 0.7 0.8 0.89  CALCIUM 9.2 8.3*   < > 9.2 9.4 9.2   < > = values in this interval not displayed.   Recent Labs    08/02/21 1926 02/05/22 0000  AST 26 15  ALT 18 13  ALKPHOS 80 80  BILITOT 0.9  --   PROT 7.0  --   ALBUMIN 3.5 3.7   Recent Labs    08/02/21 1926 08/03/21 0511 02/05/22 0000 04/19/22 0000 04/25/22 1154  WBC 5.4 4.8 6.5 5.9 6.9  NEUTROABS 4.0  --  3.50  --  4.4  HGB 14.3 12.5 13.2 13.6 13.7  HCT 45.7 38.4 40 40 42.1  MCV 90.0 88.5  --   --  89.6  PLT 169 151 197 198 178   Lab Results  Component Value Date   TSH 0.942 04/25/2022   No results found for: "HGBA1C" Lab Results  Component Value Date   CHOL 235 (A) 07/20/2020   HDL 100 (A) 07/20/2020   LDLCALC 134 07/20/2020   TRIG 102 07/20/2020    Significant Diagnostic Results in last 30 days:  CT Head Wo Contrast  Result Date: 06/03/2022 CLINICAL DATA:  Fall  striking head, denies loss of consciousness, history of Alzheimer's EXAM: CT HEAD WITHOUT CONTRAST CT CERVICAL SPINE WITHOUT CONTRAST TECHNIQUE: Multidetector CT imaging of the head and cervical spine was performed following the standard protocol without intravenous contrast. Multiplanar CT image reconstructions of the cervical spine were also generated. RADIATION DOSE REDUCTION: This exam was performed according to the departmental dose-optimization program which includes automated exposure control, adjustment of the mA and/or kV according to patient size and/or use of iterative reconstruction technique. COMPARISON:  CT head 08/02/2021 FINDINGS: CT HEAD FINDINGS Brain: Generalized atrophy. Normal ventricular morphology. No midline shift or mass effect. Small vessel chronic ischemic changes of deep cerebral white matter. No intracranial hemorrhage, mass lesion, evidence of acute infarction, or extra-axial fluid collection. Vascular: Extensive atherosclerotic calcifications of internal carotid arteries and basilar artery with basilar dolichoectasia. Skull: Intact.  Small posterior scalp hematoma. Sinuses/Orbits: Clear Other: N/A CT CERVICAL SPINE FINDINGS Alignment: Normal Skull base and vertebrae: Osseous demineralization. Visualized skull base intact. Vertebral body heights maintained. Multilevel facet degenerative changes. Disc space narrowing with endplate spur formation at C5-C6. Additional endplate spur formation C4-C5 and C6-C7. No acute fracture, subluxation, or bone destruction. Soft tissues and spinal canal: Prevertebral soft tissues normal thickness. Extensive basilar artery can calcification. Fluid and air within distended upper thoracic esophagus. Asymmetric  enlargement of LEFT thyroid lobe versus RIGHT without discrete mass. Disc levels:  No specific abnormality Upper chest: Lung apices clear Other: N/A IMPRESSION: Atrophy with small vessel chronic ischemic changes of deep cerebral white matter. No  acute intracranial abnormalities. Degenerative disc and facet disease changes of the cervical spine. No acute cervical spine abnormalities. Extensive atherosclerotic calcifications of internal carotid arteries and basilar artery at skull base with basilar dolichoectasia. Electronically Signed   By: Ulyses Southward M.D.   On: 06/03/2022 16:48   CT Cervical Spine Wo Contrast  Result Date: 06/03/2022 CLINICAL DATA:  Fall striking head, denies loss of consciousness, history of Alzheimer's EXAM: CT HEAD WITHOUT CONTRAST CT CERVICAL SPINE WITHOUT CONTRAST TECHNIQUE: Multidetector CT imaging of the head and cervical spine was performed following the standard protocol without intravenous contrast. Multiplanar CT image reconstructions of the cervical spine were also generated. RADIATION DOSE REDUCTION: This exam was performed according to the departmental dose-optimization program which includes automated exposure control, adjustment of the mA and/or kV according to patient size and/or use of iterative reconstruction technique. COMPARISON:  CT head 08/02/2021 FINDINGS: CT HEAD FINDINGS Brain: Generalized atrophy. Normal ventricular morphology. No midline shift or mass effect. Small vessel chronic ischemic changes of deep cerebral white matter. No intracranial hemorrhage, mass lesion, evidence of acute infarction, or extra-axial fluid collection. Vascular: Extensive atherosclerotic calcifications of internal carotid arteries and basilar artery with basilar dolichoectasia. Skull: Intact.  Small posterior scalp hematoma. Sinuses/Orbits: Clear Other: N/A CT CERVICAL SPINE FINDINGS Alignment: Normal Skull base and vertebrae: Osseous demineralization. Visualized skull base intact. Vertebral body heights maintained. Multilevel facet degenerative changes. Disc space narrowing with endplate spur formation at C5-C6. Additional endplate spur formation C4-C5 and C6-C7. No acute fracture, subluxation, or bone destruction. Soft tissues  and spinal canal: Prevertebral soft tissues normal thickness. Extensive basilar artery can calcification. Fluid and air within distended upper thoracic esophagus. Asymmetric enlargement of LEFT thyroid lobe versus RIGHT without discrete mass. Disc levels:  No specific abnormality Upper chest: Lung apices clear Other: N/A IMPRESSION: Atrophy with small vessel chronic ischemic changes of deep cerebral white matter. No acute intracranial abnormalities. Degenerative disc and facet disease changes of the cervical spine. No acute cervical spine abnormalities. Extensive atherosclerotic calcifications of internal carotid arteries and basilar artery at skull base with basilar dolichoectasia. Electronically Signed   By: Ulyses Southward M.D.   On: 06/03/2022 16:48    Assessment/Plan 1. Labile hypertension Discussed with Nurses about fluctuating BP Inform Our team instead of Cardiology NO Change Continue Cozaar Hold fo rLOw SBP 2. Dementia without behavioral disturbance (HCC) SNF appropriate MRI showed 08/29/21 revealed no evidence of acute intracranial abnormality, Moderate chronic small vessel ischemic changes within the cerebral white matter. Continue Supportive care Has not tolerated any meds before MMSE 11/30 but her main issue is Aphasia Supportive care 3. Recurrent UTI On Macrodantin  4. Neurogenic bladder On Flomax    Family/ staff Communication:   Labs/tests ordered:

## 2022-07-15 ENCOUNTER — Non-Acute Institutional Stay (SKILLED_NURSING_FACILITY): Payer: Medicare Other | Admitting: Adult Health

## 2022-07-15 ENCOUNTER — Encounter: Payer: Self-pay | Admitting: Adult Health

## 2022-07-15 DIAGNOSIS — I10 Essential (primary) hypertension: Secondary | ICD-10-CM

## 2022-07-15 DIAGNOSIS — N319 Neuromuscular dysfunction of bladder, unspecified: Secondary | ICD-10-CM

## 2022-07-15 DIAGNOSIS — F0390 Unspecified dementia without behavioral disturbance: Secondary | ICD-10-CM | POA: Diagnosis not present

## 2022-07-15 DIAGNOSIS — K5901 Slow transit constipation: Secondary | ICD-10-CM

## 2022-07-15 DIAGNOSIS — M4800 Spinal stenosis, site unspecified: Secondary | ICD-10-CM | POA: Diagnosis not present

## 2022-07-15 DIAGNOSIS — N390 Urinary tract infection, site not specified: Secondary | ICD-10-CM | POA: Diagnosis not present

## 2022-07-15 NOTE — Progress Notes (Signed)
Location:  [facility] Service:  SNF (31) Provider: Fletcher Anon, NP   Patient Care Team: Mahlon Gammon, MD as PCP - General (Internal Medicine) Jamison Neighbor, MD (Urology)  Extended Emergency Contact Information Primary Emergency Contact: Okuda,[name] Address: [address]          [place], [place] [address] [place] of [place] Home Phone: [phone] Work Phone: [phone] Relation: Son Secondary Emergency Contact: Mary Sella [place] of [place] Home Phone: [phone] Work Phone: [phone] Mobile Phone: [phone] Relation: Son  Code Status:  DNR Goals of care: Advanced Directive information    07/01/2022   11:30 AM  Advanced Directives  Does Patient Have a Medical Advance Directive? Yes  Type of Estate agent of [place];Living will;Out of facility DNR (pink MOST or yellow form)  Does patient want to make changes to medical advance directive? No - Patient declined  Copy of Healthcare Power of Attorney in Chart? No - copy requested     Chief Complaint  Patient presents with   Medical Management of Chronic Issues    HPI:  Pt is a 87 y.o. female seen today for medical management of chronic diseases.    PMH includes HTN, Dementia without behavioral disturbances, orthostatic dizziness, hx of neurogenic bladder with incomplete emptying, recurrent UTI, urosepsis, spinal stenosis, constipation, gait abnormality  AD: MMSE 11/30. Continues to remain pleasant and ambulatory.  MRI ordered and results 11/9 revealed no evidence of acute intracranial abnormality, Moderate chronic small vessel ischemic changes within the cerebral white matter. Neurogenic bladder and urinary incontinence: no urinary symptoms. No issues emptying reported. Continues on flomax. Has estring.  HTN: on losartan Has hx of orthostatic hypotension  She is now using a wheelchair most of the time.  Bps reviewed in  matrix.  Varying between 70s-190s, more often 90s-120s, no recent syncope.   Wt Readings from Last 3 Encounters:  07/15/22 145 lb (65.8 kg)  07/01/22 145 lb (65.8 kg)  06/18/22 144 lb 12.8 oz (65.7 kg)   Constipation:LBM 9/22  Past Medical History:  Diagnosis Date   Alzheimer disease (HCC)    Balance problem 07/19/2020   Constipation 07/19/2020   Dementia without behavioral disturbance (HCC) 07/19/2020   MMSE 21/30 07/21/20   Fall    Osteoarthritis    Pyelonephritis    Spinal stenosis 07/19/2020   Weakness of left lower extremity 07/19/2020   No past surgical history on file.  Allergies  Allergen Reactions   Sulfa Antibiotics Rash    Rash to trunk, legs, neck, scalp, and arms    Outpatient Encounter Medications as of 07/15/2022  Medication Sig   acetaminophen (TYLENOL) 500 MG tablet Take 2 tablets (1,000 mg total) by mouth in the morning.   acetaminophen (TYLENOL) 500 MG tablet Take 500 mg by mouth 3 (three) times daily as needed.   Cranberry (THERACRAN PO) Take 250 mg by mouth in the morning and at bedtime.   docusate sodium (COLACE) 100 MG capsule Take 100 mg by mouth 2 (two) times daily as needed for mild constipation.   Emollient (CERAVE) LOTN Apply topically daily. Apply Cerave lotion to back and extremities   estradiol (ESTRING) 2 MG vaginal ring Place 2 mg vaginally every 3 (three) months. follow package directions   hydrALAZINE (APRESOLINE) 10 MG tablet Take 10 mg by mouth every 8 (eight) hours as needed.   ibuprofen (ADVIL) 200 MG tablet Take 400 mg by mouth 3 (three) times daily  as needed for mild pain.   losartan (COZAAR) 25 MG tablet Take 1 tablet (25 mg total) by mouth daily.   mupirocin ointment (BACTROBAN) 2 % Apply 1 Application topically 2 (two) times daily.   nitrofurantoin (MACRODANTIN) 50 MG capsule Take 50 mg by mouth 2 (two) times daily.   polyethylene glycol powder (GLYCOLAX/MIRALAX) 17 GM/SCOOP powder Take 17 g by mouth every other day. Hold for loose  stools   senna (SENOKOT) 8.6 MG TABS tablet Take 1 tablet by mouth in the morning and at bedtime.   tamsulosin (FLOMAX) 0.4 MG CAPS capsule Take 0.4 mg by mouth at bedtime.   No facility-administered encounter medications on file as of 07/15/2022.    Review of Systems  Constitutional:  Negative for activity change, appetite change, chills, diaphoresis, fatigue, fever and unexpected weight change.  HENT:  Negative for congestion.   Respiratory:  Negative for cough, shortness of breath and wheezing.   Cardiovascular:  Positive for leg swelling. Negative for chest pain and palpitations.  Gastrointestinal:  Negative for abdominal distention, abdominal pain, constipation and diarrhea.  Genitourinary:  Negative for difficulty urinating and dysuria.  Musculoskeletal:  Positive for gait problem. Negative for arthralgias, back pain, joint swelling and myalgias.  Neurological:  Positive for dizziness. Negative for tremors, seizures, syncope, facial asymmetry, speech difficulty, weakness, light-headedness, numbness and headaches.  Psychiatric/Behavioral:  Positive for confusion. Negative for agitation and behavioral problems.     Immunization History  Administered Date(s) Administered   Influenza, High Dose Seasonal PF 07/25/2021   Influenza-Unspecified 08/05/2018, 09/29/2018, 07/02/2019, 08/21/2019, 08/11/2020   Moderna Covid-19 Vaccine Bivalent Booster 26yrs & up 08/01/2021   Moderna SARS-COV2 Booster Vaccination 08/31/2020, 06/12/2021   Moderna Sars-Covid-2 Vaccination 11/04/2019, 12/02/2019   Pneumococcal Conjugate-13 09/28/2018   Pneumococcal Polysaccharide-23 10/21/2017   Tdap 02/23/2021   Zoster Recombinat (Shingrix) 05/24/2021   Pertinent  Health Maintenance Due  Topic Date Due   INFLUENZA VACCINE  05/21/2022   DEXA SCAN  Discontinued      08/04/2021   10:39 AM 08/05/2021    1:00 AM 10/19/2021    1:02 PM 04/25/2022   11:04 AM 06/03/2022    3:33 PM  Fall Risk  Falls in the past  year?   0    Was there an injury with Fall?   0    Fall Risk Category Calculator   0    Fall Risk Category   Low    Patient Fall Risk Level High fall risk High fall risk Moderate fall risk High fall risk High fall risk  Patient at Risk for Falls Due to   Impaired balance/gait    Fall risk Follow up   Falls evaluation completed     Functional Status Survey:    Vitals:   07/15/22 1216  Weight: 145 lb (65.8 kg)   Body mass index is 26.52 kg/m. Physical Exam Vitals and nursing note reviewed.  Constitutional:      General: She is not in acute distress.    Appearance: She is not diaphoretic.  HENT:     Head: Normocephalic and atraumatic.     Mouth/Throat:     Mouth: Mucous membranes are moist.     Pharynx: Oropharynx is clear. No oropharyngeal exudate.  Eyes:     Conjunctiva/sclera: Conjunctivae normal.     Pupils: Pupils are equal, round, and reactive to light.  Neck:     Vascular: No JVD.  Cardiovascular:     Rate and Rhythm: Normal rate and regular rhythm.  Heart sounds: No murmur heard. Pulmonary:     Effort: Pulmonary effort is normal. No respiratory distress.     Breath sounds: Normal breath sounds. No wheezing.  Abdominal:     General: Abdomen is flat. Bowel sounds are normal. There is no distension.     Palpations: Abdomen is soft.     Tenderness: There is no abdominal tenderness.  Musculoskeletal:     Cervical back: No rigidity or tenderness.     Comments: Trace BLE edema   Lymphadenopathy:     Cervical: No cervical adenopathy.  Skin:    General: Skin is warm and dry.  Neurological:     General: No focal deficit present.     Mental Status: She is alert. Mental status is at baseline.  Psychiatric:        Mood and Affect: Mood normal.     Labs reviewed: Recent Labs    08/02/21 1926 08/03/21 0511 08/14/21 0000 02/05/22 0000 04/19/22 0000 04/25/22 1154  NA 135 135   < > 140 139 139  K 3.8 3.6   < > 4.0 4.3 4.4  CL 102 101   < > 106 103 104  CO2  24 25   < > 28* 26* 26  GLUCOSE 107* 91  --   --   --  113*  BUN 8 8   < > 13 14 13   CREATININE 0.77 0.73   < > 0.7 0.8 0.89  CALCIUM 9.2 8.3*   < > 9.2 9.4 9.2   < > = values in this interval not displayed.    Recent Labs    08/02/21 1926 02/05/22 0000  AST 26 15  ALT 18 13  ALKPHOS 80 80  BILITOT 0.9  --   PROT 7.0  --   ALBUMIN 3.5 3.7    Recent Labs    08/02/21 1926 08/03/21 0511 02/05/22 0000 04/19/22 0000 04/25/22 1154  WBC 5.4 4.8 6.5 5.9 6.9  NEUTROABS 4.0  --  3.50  --  4.4  HGB 14.3 12.5 13.2 13.6 13.7  HCT 45.7 38.4 40 40 42.1  MCV 90.0 88.5  --   --  89.6  PLT 169 151 197 198 178    Lab Results  Component Value Date   TSH 0.942 04/25/2022   No results found for: "HGBA1C" Lab Results  Component Value Date   CHOL 235 (A) 07/20/2020   HDL 100 (A) 07/20/2020   LDLCALC 134 07/20/2020   TRIG 102 07/20/2020    Significant Diagnostic Results in last 30 days:  No results found.  Assessment/Plan  1. Essential hypertension Controlled on losartan with periodic highs and lows Would continue current dose of losartan, Dr Lyndel Safe to assess next month.   2. Dementia without behavioral disturbance (HCC) Progressive decline in cognition and physical function c/w the disease. Continue supportive care in the skilled environment.  3. Recurrent UTI No current symptoms Continue macrodantin for prophylaxis.   4. Spinal stenosis, unspecified spinal region No current issues On tylenol  5. Neurogenic bladder No issues voiding Continue flomax.   6. Slow transit constipation Continue miralax qod and senokot s daily    Family/ staff Communication: nurse   Labs/tests ordered:  NA

## 2022-08-15 ENCOUNTER — Non-Acute Institutional Stay (SKILLED_NURSING_FACILITY): Payer: Medicare Other | Admitting: Adult Health

## 2022-08-15 DIAGNOSIS — R21 Rash and other nonspecific skin eruption: Secondary | ICD-10-CM

## 2022-08-15 DIAGNOSIS — I10 Essential (primary) hypertension: Secondary | ICD-10-CM

## 2022-08-15 DIAGNOSIS — N390 Urinary tract infection, site not specified: Secondary | ICD-10-CM

## 2022-08-15 DIAGNOSIS — K5901 Slow transit constipation: Secondary | ICD-10-CM

## 2022-08-15 DIAGNOSIS — M4800 Spinal stenosis, site unspecified: Secondary | ICD-10-CM

## 2022-08-15 DIAGNOSIS — N319 Neuromuscular dysfunction of bladder, unspecified: Secondary | ICD-10-CM

## 2022-08-15 DIAGNOSIS — F0390 Unspecified dementia without behavioral disturbance: Secondary | ICD-10-CM

## 2022-08-16 ENCOUNTER — Encounter: Payer: Self-pay | Admitting: Adult Health

## 2022-08-16 NOTE — Progress Notes (Signed)
Location:  Medical illustrator of Service:  SNF (31) Provider: Fletcher Anon, NP   Patient Care Team: Mahlon Gammon, MD as PCP - General (Internal Medicine) Jamison Neighbor, MD (Urology)  Extended Emergency Contact Information Primary Emergency Contact: Dumlao,[name] Address: [address]          [place], [place] [address] Darden [place] of [place] Home Phone: [phone] Work Phone: [phone] Relation: Son Secondary Emergency Contact: Mary Sella [place] of [place] Home Phone: [phone] Work Phone: [phone] Mobile Phone: [phone] Relation: Son  Code Status:  DNR Goals of care: Advanced Directive information    07/01/2022   11:30 AM  Advanced Directives  Does Patient Have a Medical Advance Directive? Yes  Type of Estate agent of [place];Living will;Out of facility DNR (pink MOST or yellow form)  Does patient want to make changes to medical advance directive? No - Patient declined  Copy of Healthcare Power of Attorney in Chart? No - copy requested     Chief Complaint  Patient presents with   Medical Management of Chronic Issues    HPI:  Pt is a 87 y.o. female seen today for medical management of chronic diseases.    PMH includes HTN, Dementia without behavioral disturbances, orthostatic dizziness, hx of neurogenic bladder with incomplete emptying, recurrent UTI, urosepsis, spinal stenosis, constipation, gait abnormality  AD: MMSE 11/30. Continues to remain pleasant.  Has periods of refusal of care per nursing notes. No aggression.  MRI ordered and results 11/9 revealed no evidence of acute intracranial abnormality, Moderate chronic small vessel ischemic changes within the cerebral white matter.  Neurogenic bladder and urinary incontinence: no urinary symptoms. No issues emptying reported. Continues on flomax. Has estring. UTI prevention with macrobid.  HTN: on losartan Has hx of  orthostatic hypotension  She is now using a wheelchair most of the time. Had therapy evaluation for sliding in chair.  Bps reviewed in matrix.  Varying between 90-170's. Doing fairly well, no syncope. Chronically reports dizziness, tired.   Wt Readings from Last 3 Encounters:  08/16/22 142 lb 3.2 oz (64.5 kg)  07/15/22 145 lb (65.8 kg)  07/01/22 145 lb (65.8 kg)   Constipation:LBM 10/26  Continues with a rash to the left temple. Difficult to resolve. Has tried steroid cream and bactroban. No on Cleocin. Followed by dermatology   Past Medical History:  Diagnosis Date   Alzheimer disease (HCC)    Balance problem 07/19/2020   Constipation 07/19/2020   Dementia without behavioral disturbance (HCC) 07/19/2020   MMSE 21/30 07/21/20   Fall    Osteoarthritis    Pyelonephritis    Spinal stenosis 07/19/2020   Weakness of left lower extremity 07/19/2020   History reviewed. No pertinent surgical history.  Allergies  Allergen Reactions   Sulfa Antibiotics Rash    Rash to trunk, legs, neck, scalp, and arms    Outpatient Encounter Medications as of 08/15/2022  Medication Sig   acetaminophen (TYLENOL) 500 MG tablet Take 2 tablets (1,000 mg total) by mouth in the morning.   acetaminophen (TYLENOL) 500 MG tablet Take 500 mg by mouth 3 (three) times daily as needed.   Cranberry (THERACRAN PO) Take 250 mg by mouth in the morning and at bedtime.   docusate sodium (COLACE) 100 MG capsule Take 100 mg by mouth 2 (two) times daily as needed for mild constipation.   Emollient (CERAVE) LOTN Apply topically daily. Apply Cerave lotion to back and extremities   estradiol (  ESTRING) 2 MG vaginal ring Place 2 mg vaginally every 3 (three) months. follow package directions   hydrALAZINE (APRESOLINE) 10 MG tablet Take 10 mg by mouth every 8 (eight) hours as needed.   ibuprofen (ADVIL) 200 MG tablet Take 400 mg by mouth 3 (three) times daily as needed for mild pain.   losartan (COZAAR) 25 MG tablet Take 1 tablet  (25 mg total) by mouth daily.   nitrofurantoin (MACRODANTIN) 50 MG capsule Take 50 mg by mouth 2 (two) times daily.   senna (SENOKOT) 8.6 MG TABS tablet Take 1 tablet by mouth in the morning and at bedtime.   tamsulosin (FLOMAX) 0.4 MG CAPS capsule Take 0.4 mg by mouth at bedtime.   triamcinolone cream (KENALOG) 0.1 % Apply 1 Application topically 2 (two) times daily. To temple area x 4 weeks   [DISCONTINUED] mupirocin ointment (BACTROBAN) 2 % Apply 1 Application topically 2 (two) times daily.   No facility-administered encounter medications on file as of 08/15/2022.    Review of Systems  Constitutional:  Negative for activity change, appetite change, chills, diaphoresis, fatigue, fever and unexpected weight change.  HENT:  Negative for congestion.   Respiratory:  Negative for cough, shortness of breath and wheezing.   Cardiovascular:  Positive for leg swelling. Negative for chest pain and palpitations.  Gastrointestinal:  Negative for abdominal distention, abdominal pain, constipation and diarrhea.  Genitourinary:  Negative for difficulty urinating and dysuria.  Musculoskeletal:  Positive for gait problem. Negative for arthralgias, back pain, joint swelling and myalgias.  Skin:  Positive for rash.  Neurological:  Positive for dizziness. Negative for tremors, seizures, syncope, facial asymmetry, speech difficulty, weakness, light-headedness, numbness and headaches.  Psychiatric/Behavioral:  Positive for confusion. Negative for agitation and behavioral problems.     Immunization History  Administered Date(s) Administered   Influenza, High Dose Seasonal PF 07/25/2021   Influenza-Unspecified 08/05/2018, 09/29/2018, 07/02/2019, 08/21/2019, 08/11/2020, 07/26/2022   Moderna Covid-19 Vaccine Bivalent Booster 59yrs & up 08/01/2021   Moderna SARS-COV2 Booster Vaccination 08/31/2020, 06/12/2021   Moderna Sars-Covid-2 Vaccination 11/04/2019, 12/02/2019   Pneumococcal Conjugate-13 09/28/2018    Pneumococcal Polysaccharide-23 10/21/2017   Tdap 02/23/2021   Zoster Recombinat (Shingrix) 05/24/2021   Pertinent  Health Maintenance Due  Topic Date Due   INFLUENZA VACCINE  Completed   DEXA SCAN  Discontinued      08/04/2021   10:39 AM 08/05/2021    1:00 AM 10/19/2021    1:02 PM 04/25/2022   11:04 AM 06/03/2022    3:33 PM  Fall Risk  Falls in the past year?   0    Was there an injury with Fall?   0    Fall Risk Category Calculator   0    Fall Risk Category   Low    Patient Fall Risk Level High fall risk High fall risk Moderate fall risk High fall risk High fall risk  Patient at Risk for Falls Due to   Impaired balance/gait    Fall risk Follow up   Falls evaluation completed     Functional Status Survey:    Vitals:   08/16/22 1137  Weight: 142 lb 3.2 oz (64.5 kg)   Body mass index is 26.01 kg/m. Wt Readings from Last 3 Encounters:  08/16/22 142 lb 3.2 oz (64.5 kg)  07/15/22 145 lb (65.8 kg)  07/01/22 145 lb (65.8 kg)    Physical Exam Vitals and nursing note reviewed.  Constitutional:      General: She is not in acute distress.  Appearance: She is not diaphoretic.  HENT:     Head: Normocephalic and atraumatic.     Mouth/Throat:     Mouth: Mucous membranes are moist.     Pharynx: Oropharynx is clear. No oropharyngeal exudate.  Eyes:     Conjunctiva/sclera: Conjunctivae normal.     Pupils: Pupils are equal, round, and reactive to light.  Neck:     Vascular: No JVD.  Cardiovascular:     Rate and Rhythm: Normal rate and regular rhythm.     Heart sounds: No murmur heard. Pulmonary:     Effort: Pulmonary effort is normal. No respiratory distress.     Breath sounds: Normal breath sounds. No wheezing.  Abdominal:     General: Abdomen is flat. Bowel sounds are normal. There is no distension.     Palpations: Abdomen is soft.     Tenderness: There is no abdominal tenderness.  Musculoskeletal:     Cervical back: No rigidity or tenderness.     Comments: Trace BLE  edema   Lymphadenopathy:     Cervical: No cervical adenopathy.  Skin:    General: Skin is warm and dry.     Comments: Maculopapular rash left temple.   Neurological:     General: No focal deficit present.     Mental Status: She is alert. Mental status is at baseline.  Psychiatric:        Mood and Affect: Mood normal.     Labs reviewed: Recent Labs    02/05/22 0000 04/19/22 0000 04/25/22 1154  NA 140 139 139  K 4.0 4.3 4.4  CL 106 103 104  CO2 28* 26* 26  GLUCOSE  --   --  113*  BUN 13 14 13   CREATININE 0.7 0.8 0.89  CALCIUM 9.2 9.4 9.2    Recent Labs    02/05/22 0000  AST 15  ALT 13  ALKPHOS 80  ALBUMIN 3.7    Recent Labs    02/05/22 0000 04/19/22 0000 04/25/22 1154  WBC 6.5 5.9 6.9  NEUTROABS 3.50  --  4.4  HGB 13.2 13.6 13.7  HCT 40 40 42.1  MCV  --   --  89.6  PLT 197 198 178    Lab Results  Component Value Date   TSH 0.942 04/25/2022   No results found for: "HGBA1C" Lab Results  Component Value Date   CHOL 235 (A) 07/20/2020   HDL 100 (A) 07/20/2020   LDLCALC 134 07/20/2020   TRIG 102 07/20/2020    Significant Diagnostic Results in last 30 days:  No results found.  Assessment/Plan  1. Essential hypertension Remains labile but more stable than before.  Will continue losartan, hold for low bp   2. Dementia without behavioral disturbance ([place]) Progressive decline in cognition and physical function c/w the disease. Continue supportive care in the skilled environment.  3. Recurrent UTI No current symptoms Continue macrodantin for prophylaxis.   4. Spinal stenosis, unspecified spinal region No current issues On tylenol  5. Neurogenic bladder No issues voiding Continue flomax.   6. Slow transit constipation Continue miralax qod and senokot s daily   7. Rash Unclear etiology Followed by dermatology on Cleocin. No change thus far.    Family/ staff Communication: nurse   Labs/tests ordered:  NA

## 2022-09-09 ENCOUNTER — Non-Acute Institutional Stay (SKILLED_NURSING_FACILITY): Payer: Medicare Other | Admitting: Internal Medicine

## 2022-09-09 ENCOUNTER — Encounter: Payer: Self-pay | Admitting: Internal Medicine

## 2022-09-09 DIAGNOSIS — K5901 Slow transit constipation: Secondary | ICD-10-CM

## 2022-09-09 DIAGNOSIS — F0390 Unspecified dementia without behavioral disturbance: Secondary | ICD-10-CM | POA: Diagnosis not present

## 2022-09-09 DIAGNOSIS — N319 Neuromuscular dysfunction of bladder, unspecified: Secondary | ICD-10-CM

## 2022-09-09 DIAGNOSIS — N390 Urinary tract infection, site not specified: Secondary | ICD-10-CM

## 2022-09-09 DIAGNOSIS — I10 Essential (primary) hypertension: Secondary | ICD-10-CM

## 2022-09-09 NOTE — Progress Notes (Signed)
Location:  Occupational psychologist of Service:  SNF (31)  Provider:   Code Status: DNR Goals of Care:     07/01/2022   11:30 AM  Advanced Directives  Does Patient Have a Medical Advance Directive? Yes  Type of Paramedic of [place];Living will;Out of facility DNR (pink MOST or yellow form)  Does patient want to make changes to medical advance directive? No - Patient declined  Copy of [place] in Chart? No - copy requested     Chief Complaint  Patient presents with   Chronic Care Management    HPI: Patient is a 87 y.o. female seen today for medical management of chronic diseases.    Lives in SNF   Patient has a history of Alzheimer's dementia with Aphasia, hypertension, orthostatic hypotension She also has history of recurrent UTIs and urinary incontinence  Also h/o Orthostatic BP with Wide variation SBP Can vary from 170-90     She is stable. No new Nursing issues. No Behavior issues Her weight is stable Now stays in Wheelchair  Has Chronic Dizziness and Labile BP  No Falls Wt Readings from Last 3 Encounters:  08/16/22 142 lb 3.2 oz (64.5 kg)  07/15/22 145 lb (65.8 kg)  07/01/22 145 lb (65.8 kg)    Past Medical History:  Diagnosis Date   Alzheimer disease ([place])    Balance problem 07/19/2020   Constipation 07/19/2020   Dementia without behavioral disturbance ([place]) 07/19/2020   MMSE 21/30 07/21/20   Fall    Osteoarthritis    Pyelonephritis    Spinal stenosis 07/19/2020   Weakness of left lower extremity 07/19/2020    No past surgical history on file.  Allergies  Allergen Reactions   Sulfa Antibiotics Rash    Rash to trunk, legs, neck, scalp, and arms    Outpatient Encounter Medications as of 09/09/2022  Medication Sig   polyethylene glycol (MIRALAX / GLYCOLAX) 17 g packet Take 17 g by mouth every other day.   acetaminophen (TYLENOL) 500 MG tablet Take 2 tablets (1,000 mg total) by mouth in  the morning.   acetaminophen (TYLENOL) 500 MG tablet Take 500 mg by mouth 3 (three) times daily as needed.   clindamycin (CLEOCIN T) 1 % lotion Apply 1 Application topically 2 (two) times daily.   Cranberry (THERACRAN PO) Take 250 mg by mouth in the morning and at bedtime.   docusate sodium (COLACE) 100 MG capsule Take 100 mg by mouth 2 (two) times daily as needed for mild constipation.   Emollient (CERAVE) LOTN Apply topically daily. Apply Cerave lotion to back and extremities   estradiol (ESTRING) 2 MG vaginal ring Place 2 mg vaginally every 3 (three) months. follow package directions   hydrALAZINE (APRESOLINE) 10 MG tablet Take 10 mg by mouth every 8 (eight) hours as needed.   ibuprofen (ADVIL) 200 MG tablet Take 400 mg by mouth 3 (three) times daily as needed for mild pain.   losartan (COZAAR) 25 MG tablet Take 1 tablet (25 mg total) by mouth daily.   nitrofurantoin (MACRODANTIN) 50 MG capsule Take 50 mg by mouth 2 (two) times daily.   senna (SENOKOT) 8.6 MG TABS tablet Take 2 tablets by mouth in the morning and at bedtime.   tamsulosin (FLOMAX) 0.4 MG CAPS capsule Take 0.4 mg by mouth at bedtime.   triamcinolone cream (KENALOG) 0.1 % Apply 1 Application topically 2 (two) times daily. To temple area x 4 weeks  No facility-administered encounter medications on file as of 09/09/2022.    Review of Systems:  Review of Systems  Unable to perform ROS: Dementia    Health Maintenance  Topic Date Due   Zoster Vaccines- Shingrix (2 of 2) 07/19/2021   Medicare Annual Wellness (AWV)  10/19/2022   COVID-19 Vaccine (4 - Moderna risk series) 12/20/2022 (Originally 09/26/2021)   Pneumonia Vaccine 48+ Years old  Completed   INFLUENZA VACCINE  Completed   HPV VACCINES  Aged Out   DEXA SCAN  Discontinued    Physical Exam: Vitals:   09/09/22 1946  BP: 110/71  Pulse: 81  Resp: 16  Temp: (!) 97.2 F (36.2 C)   There is no height or weight on file to calculate BMI. Physical Exam Vitals  reviewed.  Constitutional:      Appearance: Normal appearance.  HENT:     Head: Normocephalic.     Nose: Nose normal.     Mouth/Throat:     Mouth: Mucous membranes are moist.     Pharynx: Oropharynx is clear.  Eyes:     Pupils: Pupils are equal, round, and reactive to light.  Cardiovascular:     Rate and Rhythm: Normal rate and regular rhythm.     Pulses: Normal pulses.     Heart sounds: Normal heart sounds. No murmur heard. Pulmonary:     Effort: Pulmonary effort is normal.     Breath sounds: Normal breath sounds.  Abdominal:     General: Abdomen is flat. Bowel sounds are normal.     Palpations: Abdomen is soft.  Musculoskeletal:        General: No swelling.     Cervical back: Neck supple.  Skin:    General: Skin is warm.     Comments: Temporal Lesion Post Biopsy  Neurological:     General: No focal deficit present.     Mental Status: She is alert.     Comments: Has aphasia  Psychiatric:        Mood and Affect: Mood normal.        Thought Content: Thought content normal.     Labs reviewed: Basic Metabolic Panel: Recent Labs    02/05/22 0000 04/19/22 0000 04/25/22 1154 04/25/22 1238  NA 140 139 139  --   K 4.0 4.3 4.4  --   CL 106 103 104  --   CO2 28* 26* 26  --   GLUCOSE  --   --  113*  --   BUN 13 14 13   --   CREATININE 0.7 0.8 0.89  --   CALCIUM 9.2 9.4 9.2  --   TSH  --  1.67  --  0.942   Liver Function Tests: Recent Labs    02/05/22 0000  AST 15  ALT 13  ALKPHOS 80  ALBUMIN 3.7   No results for input(s): "LIPASE", "AMYLASE" in the last 8760 hours. No results for input(s): "AMMONIA" in the last 8760 hours. CBC: Recent Labs    02/05/22 0000 04/19/22 0000 04/25/22 1154  WBC 6.5 5.9 6.9  NEUTROABS 3.50  --  4.4  HGB 13.2 13.6 13.7  HCT 40 40 42.1  MCV  --   --  89.6  PLT 197 198 178   Lipid Panel: No results for input(s): "CHOL", "HDL", "LDLCALC", "TRIG", "CHOLHDL", "LDLDIRECT" in the last 8760 hours. No results found for:  "HGBA1C"  Procedures since last visit: No results found.  Assessment/Plan 1. Essential hypertension Very Labile On Low dose  of [place]  2. Dementia without behavioral disturbance ([place]) SNF appropriate MRI showed 08/29/21 revealed no evidence of acute intracranial abnormality, Moderate chronic small vessel ischemic changes within the cerebral white matter. Continue Supportive care Has not tolerated any meds before MMSE 11/30 but her main issue is Aphasia  3. Recurrent UTI Macrodantin  4. Neurogenic bladder Flomax  5. Slow transit constipation Senakot and Miralax    Labs/tests ordered:  * No order type specified * Next appt:  Visit date not found

## 2022-09-19 ENCOUNTER — Encounter: Payer: Self-pay | Admitting: Adult Health

## 2022-09-19 ENCOUNTER — Non-Acute Institutional Stay (SKILLED_NURSING_FACILITY): Payer: Medicare Other | Admitting: Adult Health

## 2022-09-19 DIAGNOSIS — N319 Neuromuscular dysfunction of bladder, unspecified: Secondary | ICD-10-CM | POA: Diagnosis not present

## 2022-09-19 DIAGNOSIS — N390 Urinary tract infection, site not specified: Secondary | ICD-10-CM

## 2022-09-19 NOTE — Progress Notes (Signed)
Location:  Occupational psychologist of Service:  SNF (31) Provider:   Cindi Carbon, [place] [phone] Virgie Dad, MD  Patient Care Team: Virgie Dad, MD as PCP - General (Internal Medicine) Domingo Pulse, MD (Urology)  Extended Emergency Contact Information Primary Emergency Contact: Brunker,[name] Address: [address] [place]          [place], [place] [address] Johnnette Litter of [place] Phone: [phone] Work Phone: [phone] Relation: Son Secondary Emergency Contact: Joni Fears [place] of [place] Phone: [phone] Work Phone: [phone] Mobile Phone: [phone] Relation: Son  Code Status:  DNR Goals of care: Advanced Directive information    07/01/2022   11:30 AM  Advanced Directives  Does Patient Have a Medical Advance Directive? Yes  Type of Paramedic of [place];Living will;Out of facility DNR (pink MOST or yellow form)  Does patient want to make changes to medical advance directive? No - Patient declined  Copy of [place] in Chart? No - copy requested     Chief Complaint  Patient presents with   Acute Visit    odor    HPI:  Pt is a 87 y.o. female seen today for an acute visit for odor.   Ms. Disanti has a hx of urinary retention and neurogenic bladder. She is incontinent. Currently on flomax and voiding well.   Nurse wrote an SBAR indicating foul odor from vaginal area but no fever, or drainage  She has a hx of recurrent UTI. She is on macrodantin for prevention. Denies any dysuria, frequency, abd pain, vaginal irritation etc. She uses an estring staff to help change.    Past Medical History:  Diagnosis Date   Alzheimer disease ([place])    Balance problem 07/19/2020   Constipation 07/19/2020   Dementia without behavioral disturbance ([place]) 07/19/2020   MMSE 21/30 07/21/20   Fall    Osteoarthritis    Pyelonephritis    Spinal  stenosis 07/19/2020   Weakness of left lower extremity 07/19/2020   History reviewed. No pertinent surgical history.  Allergies  Allergen Reactions   Sulfa Antibiotics Rash    Rash to trunk, legs, neck, scalp, and arms    Outpatient Encounter Medications as of 09/19/2022  Medication Sig   acetaminophen (TYLENOL) 500 MG tablet Take 2 tablets (1,000 mg total) by mouth in the morning.   acetaminophen (TYLENOL) 500 MG tablet Take 500 mg by mouth 3 (three) times daily as needed.   clindamycin (CLEOCIN T) 1 % lotion Apply 1 Application topically 2 (two) times daily.   Cranberry (THERACRAN PO) Take 250 mg by mouth in the morning and at bedtime.   docusate sodium (COLACE) 100 MG capsule Take 100 mg by mouth 2 (two) times daily as needed for mild constipation.   Emollient (CERAVE) LOTN Apply topically daily. Apply Cerave lotion to back and extremities   estradiol (ESTRING) 2 MG vaginal ring Place 2 mg vaginally every 3 (three) months. follow package directions   hydrALAZINE (APRESOLINE) 10 MG tablet Take 10 mg by mouth every 8 (eight) hours as needed.   ibuprofen (ADVIL) 200 MG tablet Take 400 mg by mouth 3 (three) times daily as needed for mild pain.   losartan (COZAAR) 25 MG tablet Take 1 tablet (25 mg total) by mouth daily.   nitrofurantoin (MACRODANTIN) 50 MG capsule Take 50 mg by mouth 2 (two) times daily.   polyethylene glycol (MIRALAX / GLYCOLAX)  17 g packet Take 17 g by mouth every other day.   senna (SENOKOT) 8.6 MG TABS tablet Take 2 tablets by mouth in the morning and at bedtime.   tamsulosin (FLOMAX) 0.4 MG CAPS capsule Take 0.4 mg by mouth at bedtime.   triamcinolone cream (KENALOG) 0.1 % Apply 1 Application topically 2 (two) times daily. To temple area x 4 weeks   No facility-administered encounter medications on file as of 09/19/2022.    Review of Systems  Constitutional:  Negative for activity change, appetite change, chills, diaphoresis, fatigue, fever and unexpected weight  change.  Genitourinary:  Negative for decreased urine volume, difficulty urinating, dysuria, flank pain, frequency, genital sores, hematuria, urgency, vaginal bleeding, vaginal discharge and vaginal pain.       Slight fishy odor per staff    Immunization History  Administered Date(s) Administered   Influenza, High Dose Seasonal PF 07/25/2021   Influenza-Unspecified 08/05/2018, 09/29/2018, 07/02/2019, 08/21/2019, 08/11/2020, 07/26/2022   Moderna Covid-19 Vaccine Bivalent Booster 89yrs & up 08/01/2021, 08/26/2022   Moderna SARS-COV2 Booster Vaccination 08/31/2020, 06/12/2021   Moderna Sars-Covid-2 Vaccination 11/04/2019, 12/02/2019   Pneumococcal Conjugate-13 09/28/2018   Pneumococcal Polysaccharide-23 10/21/2017   Tdap 02/23/2021   Zoster Recombinat (Shingrix) 05/24/2021   Pertinent  Health Maintenance Due  Topic Date Due   INFLUENZA VACCINE  Completed   DEXA SCAN  Discontinued      08/04/2021   10:39 AM 08/05/2021    1:00 AM 10/19/2021    1:02 PM 04/25/2022   11:04 AM 06/03/2022    3:33 PM  Fall Risk  Falls in the past year?   0    Was there an injury with Fall?   0    Fall Risk Category Calculator   0    Fall Risk Category   Low    Patient Fall Risk Level High fall risk High fall risk Moderate fall risk High fall risk High fall risk  Patient at Risk for Falls Due to   Impaired balance/gait    Fall risk Follow up   Falls evaluation completed     Functional Status Survey:    Vitals:   09/19/22 1544  BP: 121/66  Pulse: 66  Resp: 18  Temp: (!) 97.2 F (36.2 C)  SpO2: 95%   There is no height or weight on file to calculate BMI. Physical Exam Constitutional:      Appearance: Normal appearance.  Abdominal:     General: Bowel sounds are normal. There is no distension.     Palpations: Abdomen is soft.     Tenderness: There is no abdominal tenderness. There is no right CVA tenderness or left CVA tenderness.  Genitourinary:    General: Normal vulva.     Labia:         Right: No rash, tenderness or lesion.        Left: No rash, tenderness or lesion.      Urethra: No urethral swelling.     Vagina: No vaginal discharge.     Comments: Small external hemorrhoid noted. Slight foul odor with urine in brief which is clear. No erythema or drainage.  Skin:    General: Skin is warm and dry.  Neurological:     Mental Status: She is alert. Mental status is at baseline.     Labs reviewed: Recent Labs    02/05/22 0000 04/19/22 0000 04/25/22 1154  NA 140 139 139  K 4.0 4.3 4.4  CL 106 103 104  CO2 28* 26* 26  GLUCOSE  --   --  113*  BUN 13 14 13   CREATININE 0.7 0.8 0.89  CALCIUM 9.2 9.4 9.2   Recent Labs    02/05/22 0000  AST 15  ALT 13  ALKPHOS 80  ALBUMIN 3.7   Recent Labs    02/05/22 0000 04/19/22 0000 04/25/22 1154  WBC 6.5 5.9 6.9  NEUTROABS 3.50  --  4.4  HGB 13.2 13.6 13.7  HCT 40 40 42.1  MCV  --   --  89.6  PLT 197 198 178   Lab Results  Component Value Date   TSH 0.942 04/25/2022   No results found for: "HGBA1C" Lab Results  Component Value Date   CHOL 235 (A) 07/20/2020   HDL 100 (A) 07/20/2020   LDLCALC 134 07/20/2020   TRIG 102 07/20/2020    Significant Diagnostic Results in last 30 days:  No results found.  Assessment/Plan 1. Neurogenic bladder Voiding in brief. Slight odor but no purulent urine, discharge, or erythema. Continue to monitor. Currently on flomax per urology   2. Recurrent UTI No current s/s of UTI, monitor for dysuria, fever, bladder etc.  On macrodantin for prevention    07/22/2020, ANP [facility] [phone]

## 2022-10-01 ENCOUNTER — Non-Acute Institutional Stay (SKILLED_NURSING_FACILITY): Payer: Medicare Other | Admitting: Orthopedic Surgery

## 2022-10-01 ENCOUNTER — Encounter: Payer: Self-pay | Admitting: Orthopedic Surgery

## 2022-10-01 DIAGNOSIS — N390 Urinary tract infection, site not specified: Secondary | ICD-10-CM

## 2022-10-01 DIAGNOSIS — N319 Neuromuscular dysfunction of bladder, unspecified: Secondary | ICD-10-CM

## 2022-10-01 DIAGNOSIS — I10 Essential (primary) hypertension: Secondary | ICD-10-CM | POA: Diagnosis not present

## 2022-10-01 DIAGNOSIS — K5901 Slow transit constipation: Secondary | ICD-10-CM

## 2022-10-01 DIAGNOSIS — G301 Alzheimer's disease with late onset: Secondary | ICD-10-CM | POA: Diagnosis not present

## 2022-10-01 DIAGNOSIS — F0280 Dementia in other diseases classified elsewhere without behavioral disturbance: Secondary | ICD-10-CM

## 2022-10-01 LAB — URINALYSIS
Bilirubin (Urine): NEGATIVE
Blood in Urine, dipstick: NEGATIVE
Ketones, urine: NEGATIVE
Nitrites, Initial: NEGATIVE
Protein, Ur: NEGATIVE
Specific Gravity, Urine: 1.016
Urine Glucose: NORMAL
Urine, pH: 7
WBC, UA: NEGATIVE

## 2022-10-01 NOTE — Progress Notes (Deleted)
Location:  [facility] Room Number: 140A Place of Service:  SNF [phone]) Provider:  Hazle Nordmann, NP   Patient Care Team: Mahlon Gammon, MD as PCP - General (Internal Medicine) Jamison Neighbor, MD (Urology)  Extended Emergency Contact Information Primary Emergency Contact: Wynns,[name] Address: [address]          [place], [place] [address] Darden Amber of [place] Home Phone: [phone] Work Phone: [phone] Relation: Son Secondary Emergency Contact: Mary Sella [place] of [place] Home Phone: [phone] Work Phone: [phone] Mobile Phone: [phone] Relation: Son  Code Status:  DNR Goals of care: Advanced Directive information    10/01/2022    1:38 PM  Advanced Directives  Does Patient Have a Medical Advance Directive? Yes  Type of Advance Directive Living will;Out of facility DNR (pink MOST or yellow form);Healthcare Power of Attorney  Does patient want to make changes to medical advance directive? No - Patient declined  Copy of Healthcare Power of Attorney in Chart? Yes - validated most recent copy scanned in chart (See row information)     Chief Complaint  Patient presents with   Medical Management of Chronic Issues    Routine Visit with the provider on site at [facility]    Quality Metric Gaps    Needs to Discuss Medicare Annual Wellness & Shingrix Vaccine.      HPI:  Pt is a 87 y.o. female seen today for medical management of chronic diseases.     Past Medical History:  Diagnosis Date   Alzheimer disease (HCC)    Balance problem 07/19/2020   Constipation 07/19/2020   Dementia without behavioral disturbance (HCC) 07/19/2020   MMSE 21/30 07/21/20   Fall    Osteoarthritis    Pyelonephritis    Spinal stenosis 07/19/2020   Weakness of left lower extremity 07/19/2020   History reviewed. No pertinent surgical history.  Allergies  Allergen Reactions   Sulfa Antibiotics Rash    Rash to trunk,  legs, neck, scalp, and arms    Outpatient Encounter Medications as of 10/01/2022  Medication Sig   acetaminophen (TYLENOL) 500 MG tablet Take 2 tablets (1,000 mg total) by mouth in the morning.   acetaminophen (TYLENOL) 500 MG tablet Take 500 mg by mouth 3 (three) times daily as needed.   clindamycin (CLEOCIN T) 1 % lotion Apply 1 Application topically 2 (two) times daily.   Cranberry (THERACRAN PO) Take 250 mg by mouth in the morning and at bedtime.   docusate sodium (COLACE) 100 MG capsule Take 100 mg by mouth 2 (two) times daily as needed for mild constipation.   Emollient (CERAVE) LOTN Apply topically daily. Apply Cerave lotion to back and extremities   estradiol (ESTRING) 2 MG vaginal ring Place 2 mg vaginally every 3 (three) months. follow package directions   hydrALAZINE (APRESOLINE) 10 MG tablet Take 10 mg by mouth every 8 (eight) hours as needed.   ibuprofen (ADVIL) 200 MG tablet Take 400 mg by mouth 3 (three) times daily as needed for mild pain.   losartan (COZAAR) 25 MG tablet Take 1 tablet (25 mg total) by mouth daily.   nitrofurantoin (MACRODANTIN) 50 MG capsule Take 50 mg by mouth 2 (two) times daily.   polyethylene glycol (MIRALAX / GLYCOLAX) 17 g packet Take 17 g by mouth every other day.   senna (SENOKOT) 8.6 MG TABS tablet Take 2 tablets by mouth in the morning and at bedtime.   shark liver oil-cocoa butter (  PREPARATION H) 0.25-3-85.5 % suppository Place 1 suppository rectally as needed for hemorrhoids.   tamsulosin (FLOMAX) 0.4 MG CAPS capsule Take 0.4 mg by mouth at bedtime.   triamcinolone cream (KENALOG) 0.1 % Apply 1 Application topically 2 (two) times daily. To temple area x 4 weeks   No facility-administered encounter medications on file as of 10/01/2022.    Review of Systems  Immunization History  Administered Date(s) Administered   Influenza, High Dose Seasonal PF 07/25/2021   Influenza-Unspecified 08/05/2018, 09/29/2018, 07/02/2019, 08/21/2019, 08/11/2020,  07/26/2022   Moderna Covid-19 Vaccine Bivalent Booster 30yrs & up 08/01/2021, 08/26/2022   Moderna SARS-COV2 Booster Vaccination 08/31/2020, 06/12/2021   Moderna Sars-Covid-2 Vaccination 11/04/2019, 12/02/2019   Pneumococcal Conjugate-13 09/28/2018   Pneumococcal Polysaccharide-23 10/21/2017   Tdap 02/23/2021   Zoster Recombinat (Shingrix) 05/24/2021   Pertinent  Health Maintenance Due  Topic Date Due   INFLUENZA VACCINE  Completed   DEXA SCAN  Discontinued      08/05/2021    1:00 AM 10/19/2021    1:02 PM 04/25/2022   11:04 AM 06/03/2022    3:33 PM 10/01/2022    1:09 PM  Fall Risk  Falls in the past year?  0   0  Was there an injury with Fall?  0   0  Fall Risk Category Calculator  0   0  Fall Risk Category  Low   Low  Patient Fall Risk Level High fall risk Moderate fall risk High fall risk High fall risk High fall risk  Patient at Risk for Falls Due to  Impaired balance/gait   History of fall(s)  Fall risk Follow up  Falls evaluation completed   Falls evaluation completed   Functional Status Survey:    Vitals:   10/01/22 1151  BP: 133/75  Pulse: 78  Resp: 16  Temp: 97.7 F (36.5 C)  SpO2: 97%  Weight: 147 lb 6 oz (66.8 kg)  Height: 5\' 2"  (1.575 m)   Body mass index is 26.96 kg/m. Physical Exam  Labs reviewed: Recent Labs    02/05/22 0000 04/19/22 0000 04/25/22 1154  NA 140 139 139  K 4.0 4.3 4.4  CL 106 103 104  CO2 28* 26* 26  GLUCOSE  --   --  113*  BUN 13 14 13   CREATININE 0.7 0.8 0.89  CALCIUM 9.2 9.4 9.2   Recent Labs    02/05/22 0000  AST 15  ALT 13  ALKPHOS 80  ALBUMIN 3.7   Recent Labs    02/05/22 0000 04/19/22 0000 04/25/22 1154  WBC 6.5 5.9 6.9  NEUTROABS 3.50  --  4.4  HGB 13.2 13.6 13.7  HCT 40 40 42.1  MCV  --   --  89.6  PLT 197 198 178   Lab Results  Component Value Date   TSH 0.942 04/25/2022   No results found for: "HGBA1C" Lab Results  Component Value Date   CHOL 235 (A) 07/20/2020   HDL 100 (A) 07/20/2020    LDLCALC 134 07/20/2020   TRIG 102 07/20/2020    Significant Diagnostic Results in last 30 days:  No results found.  Assessment/Plan There are no diagnoses linked to this encounter.   Family/ staff Communication: ***  Labs/tests ordered:  ***

## 2022-10-01 NOTE — Progress Notes (Signed)
Location:  [facility] Room Number: 140A Place of Service:  SNF [phone]) Provider:  Octavia Heir, NP   Mahlon Gammon, MD  Patient Care Team: Mahlon Gammon, MD as PCP - General (Internal Medicine) Jamison Neighbor, MD (Urology)  Extended Emergency Contact Information Primary Emergency Contact: Havard,[name] Address: [address]          [place], [place] [address] Darden Amber of [place] Home Phone: [phone] Work Phone: [phone] Relation: Son Secondary Emergency Contact: Mary Sella [place] of [place] Home Phone: [phone] Work Phone: [phone] Mobile Phone: [phone] Relation: Son  Code Status: DNR Goals of care: Advanced Directive information    10/01/2022    1:38 PM  Advanced Directives  Does Patient Have a Medical Advance Directive? Yes  Type of Advance Directive Living will;Out of facility DNR (pink MOST or yellow form);Healthcare Power of Attorney  Does patient want to make changes to medical advance directive? No - Patient declined  Copy of Healthcare Power of Attorney in Chart? Yes - validated most recent copy scanned in chart (See row information)     Chief Complaint  Patient presents with   Medical Management of Chronic Issues    Routine Visit with the provider on site at [facility]    Quality Metric Gaps    Needs to Discuss Medicare Annual Wellness & Shingrix Vaccine.      HPI:  Pt is a 87 y.o. female seen today for medical management of chronic diseases.    She currently resides on the skilled nursing unit at [facility] due to Alzheimer's dementia. Past medical history includes: HTN (SBP > 170), orthostatic bp, recurrent UTI, constipation, weakness and spinal stenosis.   Alzheimer's- MMSE 11/30, MRI brain noted moderate chronic ischemic changes in cerebral white matter, no recent behavioral outbursts, aphasia, ambulates with wheelchair, dependent with ADLs except feeding HTN-  BUN/creat 13/0.89 04/25/2022, see trends below, remains on losartan and hydralazine qhs and prn for SBP> 180 Neurogenic bladder- remains on Flomax Recurrent UTI- remains on nitrofurantoin and cranberry supplement Constipation- remains on miralax, senna and colace  Recent blood pressures:  12/09- 133/75, 127/74  12/06- 156/83, 124/74  Recent weights:  12/01- 147.6 lbs  10/01- 142.2 lbs  09/01- 145 lbs     Past Medical History:  Diagnosis Date   Alzheimer disease (HCC)    Balance problem 07/19/2020   Constipation 07/19/2020   Dementia without behavioral disturbance (HCC) 07/19/2020   MMSE 21/30 07/21/20   Fall    Osteoarthritis    Pyelonephritis    Spinal stenosis 07/19/2020   Weakness of left lower extremity 07/19/2020   History reviewed. No pertinent surgical history.  Allergies  Allergen Reactions   Sulfa Antibiotics Rash    Rash to trunk, legs, neck, scalp, and arms    Outpatient Encounter Medications as of 10/01/2022  Medication Sig   acetaminophen (TYLENOL) 500 MG tablet Take 2 tablets (1,000 mg total) by mouth in the morning.   acetaminophen (TYLENOL) 500 MG tablet Take 500 mg by mouth 3 (three) times daily as needed.   clindamycin (CLEOCIN T) 1 % lotion Apply 1 Application topically 2 (two) times daily.   Cranberry (THERACRAN PO) Take 250 mg by mouth in the morning and at bedtime.   docusate sodium (COLACE) 100 MG capsule Take 100 mg by mouth 2 (two) times daily as needed for mild constipation.   Emollient (CERAVE) LOTN Apply topically daily. Apply Cerave lotion to back and extremities  estradiol (ESTRING) 2 MG vaginal ring Place 2 mg vaginally every 3 (three) months. follow package directions   hydrALAZINE (APRESOLINE) 10 MG tablet Take 10 mg by mouth every 8 (eight) hours as needed.   ibuprofen (ADVIL) 200 MG tablet Take 400 mg by mouth 3 (three) times daily as needed for mild pain.   losartan (COZAAR) 25 MG tablet Take 1 tablet (25 mg total) by mouth daily.    nitrofurantoin (MACRODANTIN) 50 MG capsule Take 50 mg by mouth 2 (two) times daily.   polyethylene glycol (MIRALAX / GLYCOLAX) 17 g packet Take 17 g by mouth every other day.   senna (SENOKOT) 8.6 MG TABS tablet Take 2 tablets by mouth in the morning and at bedtime.   shark liver oil-cocoa butter (PREPARATION H) 0.25-3-85.5 % suppository Place 1 suppository rectally as needed for hemorrhoids.   tamsulosin (FLOMAX) 0.4 MG CAPS capsule Take 0.4 mg by mouth at bedtime.   triamcinolone cream (KENALOG) 0.1 % Apply 1 Application topically 2 (two) times daily. To temple area x 4 weeks   No facility-administered encounter medications on file as of 10/01/2022.    Review of Systems  Unable to perform ROS: Dementia    Immunization History  Administered Date(s) Administered   Influenza, High Dose Seasonal PF 07/25/2021   Influenza-Unspecified 08/05/2018, 09/29/2018, 07/02/2019, 08/21/2019, 08/11/2020, 07/26/2022   Moderna Covid-19 Vaccine Bivalent Booster 44yrs & up 08/01/2021, 08/26/2022   Moderna SARS-COV2 Booster Vaccination 08/31/2020, 06/12/2021   Moderna Sars-Covid-2 Vaccination 11/04/2019, 12/02/2019   Pneumococcal Conjugate-13 09/28/2018   Pneumococcal Polysaccharide-23 10/21/2017   Tdap 02/23/2021   Zoster Recombinat (Shingrix) 05/24/2021   Pertinent  Health Maintenance Due  Topic Date Due   INFLUENZA VACCINE  Completed   DEXA SCAN  Discontinued      08/05/2021    1:00 AM 10/19/2021    1:02 PM 04/25/2022   11:04 AM 06/03/2022    3:33 PM 10/01/2022    1:09 PM  Fall Risk  Falls in the past year?  0   0  Was there an injury with Fall?  0   0  Fall Risk Category Calculator  0   0  Fall Risk Category  Low   Low  Patient Fall Risk Level High fall risk Moderate fall risk High fall risk High fall risk High fall risk  Patient at Risk for Falls Due to  Impaired balance/gait   History of fall(s)  Fall risk Follow up  Falls evaluation completed   Falls evaluation completed   Functional  Status Survey:    Vitals:   10/01/22 1151  BP: 133/75  Pulse: 78  Resp: 16  Temp: 97.7 F (36.5 C)  SpO2: 97%  Weight: 147 lb 6 oz (66.8 kg)  Height: 5\' 2"  (1.575 m)   Body mass index is 26.96 kg/m. Physical Exam Vitals reviewed.  Constitutional:      General: She is not in acute distress. HENT:     Head: Normocephalic.     Right Ear: There is no impacted cerumen.     Left Ear: There is no impacted cerumen.     Nose: Nose normal.     Mouth/Throat:     Mouth: Mucous membranes are moist.  Eyes:     General:        Right eye: No discharge.        Left eye: No discharge.  Cardiovascular:     Rate and Rhythm: Normal rate and regular rhythm.     Pulses: Normal  pulses.     Heart sounds: Normal heart sounds.  Pulmonary:     Effort: Pulmonary effort is normal. No respiratory distress.     Breath sounds: Normal breath sounds. No wheezing.  Abdominal:     General: Bowel sounds are normal. There is no distension.     Palpations: Abdomen is soft.     Tenderness: There is no abdominal tenderness.  Musculoskeletal:     Cervical back: Neck supple.     Right lower leg: No edema.     Left lower leg: No edema.  Skin:    General: Skin is warm and dry.     Capillary Refill: Capillary refill takes less than 2 seconds.  Neurological:     General: No focal deficit present.     Mental Status: She is alert. Mental status is at baseline.     Motor: Weakness present.     Gait: Gait abnormal.     Comments: wheelchair  Psychiatric:        Mood and Affect: Mood normal.        Behavior: Behavior normal.     Comments: Follows commands, answers with "yes/no" phrases, cannot complete sentence, alert to self     Labs reviewed: Recent Labs    02/05/22 0000 04/19/22 0000 04/25/22 1154  NA 140 139 139  K 4.0 4.3 4.4  CL 106 103 104  CO2 28* 26* 26  GLUCOSE  --   --  113*  BUN 13 14 13   CREATININE 0.7 0.8 0.89  CALCIUM 9.2 9.4 9.2   Recent Labs    02/05/22 0000  AST 15  ALT  13  ALKPHOS 80  ALBUMIN 3.7   Recent Labs    02/05/22 0000 04/19/22 0000 04/25/22 1154  WBC 6.5 5.9 6.9  NEUTROABS 3.50  --  4.4  HGB 13.2 13.6 13.7  HCT 40 40 42.1  MCV  --   --  89.6  PLT 197 198 178   Lab Results  Component Value Date   TSH 0.942 04/25/2022   No results found for: "HGBA1C" Lab Results  Component Value Date   CHOL 235 (A) 07/20/2020   HDL 100 (A) 07/20/2020   LDLCALC 134 07/20/2020   TRIG 102 07/20/2020    Significant Diagnostic Results in last 30 days:  No results found.  Assessment/Plan 1. Late onset Alzheimer's dementia without behavioral disturbance (HCC) - progressed - dependent with ADLs except feeding self - now saying "yes/no" phrases - no recent agitation - cont skilled nursing care  2. Essential hypertension - controlled - cont losartan and hydralazine prn  3. Neurogenic bladder - cont Flomax  4. Recurrent UTI - cont nitrofurantoin and cranberry supplement - cont to encourage fluids  5. Slow transit constipation - abdomen soft -cont miralax, colace and senna    Family/ staff Communication: plan discussed with patient and nurse  Labs/tests ordered:  none

## 2022-11-05 ENCOUNTER — Non-Acute Institutional Stay (SKILLED_NURSING_FACILITY): Payer: Medicare Other | Admitting: Orthopedic Surgery

## 2022-11-05 ENCOUNTER — Encounter: Payer: Self-pay | Admitting: Orthopedic Surgery

## 2022-11-05 DIAGNOSIS — N319 Neuromuscular dysfunction of bladder, unspecified: Secondary | ICD-10-CM

## 2022-11-05 DIAGNOSIS — G301 Alzheimer's disease with late onset: Secondary | ICD-10-CM

## 2022-11-05 DIAGNOSIS — K5901 Slow transit constipation: Secondary | ICD-10-CM

## 2022-11-05 DIAGNOSIS — I10 Essential (primary) hypertension: Secondary | ICD-10-CM

## 2022-11-05 DIAGNOSIS — N390 Urinary tract infection, site not specified: Secondary | ICD-10-CM

## 2022-11-05 DIAGNOSIS — R2681 Unsteadiness on feet: Secondary | ICD-10-CM

## 2022-11-05 DIAGNOSIS — F0280 Dementia in other diseases classified elsewhere without behavioral disturbance: Secondary | ICD-10-CM

## 2022-11-05 NOTE — Progress Notes (Signed)
Location:  [facility] Room Number: 140A Place of Service:  SNF [phone]) Provider:  Windell Moulding, NP   Patient Care Team: Virgie Dad, MD as PCP - General (Internal Medicine) Domingo Pulse, MD (Urology)  Extended Emergency Contact Information Primary Emergency Contact: Hargett,[name] Address: [address] [place]          [place], [place] [address] Johnnette Litter of [place] Phone: [phone] Work Phone: [phone] Relation: Son Secondary Emergency Contact: Joni Fears [place] of [place] Phone: [phone] Work Phone: [phone] Mobile Phone: [phone] Relation: Son  Code Status: DNR Goals of care: Advanced Directive information    11/05/2022   10:44 AM  Advanced Directives  Does Patient Have a Medical Advance Directive? Yes  Type of Advance Directive Living will;Out of facility DNR (pink MOST or yellow form);Healthcare Power of Attorney  Does patient want to make changes to medical advance directive? No - Patient declined  Copy of [place] in Chart? Yes - validated most recent copy scanned in chart (See row information)     Chief Complaint  Patient presents with   Medical Management of Chronic Issues    Routine Visit    HPI:  Pt is a 88 y.o. female seen today for medical management of chronic diseases.    She currently resides on the skilled nursing unit at [facility] due to Alzheimer's dementia. Past medical history includes: HTN (SBP > 170), orthostatic bp, recurrent UTI, constipation, weakness and spinal stenosis.   Unstable gait- increased rigidity, PT evaluation for transfers, hoyer lift in AM  Alzheimer's- MMSE 11/30, MRI brain noted moderate chronic ischemic changes in cerebral white matter, no recent behavioral outbursts, aphasia, ambulates with wheelchair, dependent with ADLs except feeding HTN- BUN/creat 13/0.89 04/25/2022, see trends below, remains on losartan and hydralazine  prn for SBP> 180 Neurogenic bladder- remains on Flomax Recurrent UTI- remains on nitrofurantoin and cranberry supplement Constipation- remains on miralax, senna and colace  01/05 noted to have sore throat and runny nose. Rapid flu/covid negative. No apparent cold symptoms per nursing at this time. UTD flu/covid vaccines.   Recent blood pressures:  01/15- 113/75  01/13- 175/80  01/10- 168/81, 107/68  Recent weights:  01/01- 151.6 lbs  12/01- 147.6 lbs  10/01- 142.2 lbs    History reviewed. No pertinent surgical history.  Allergies  Allergen Reactions   Sulfa Antibiotics Rash    Rash to trunk, legs, neck, scalp, and arms    Outpatient Encounter Medications as of 11/05/2022  Medication Sig   acetaminophen (TYLENOL) 500 MG tablet Take 2 tablets (1,000 mg total) by mouth in the morning.   acetaminophen (TYLENOL) 500 MG tablet Take 500 mg by mouth 3 (three) times daily as needed.   clindamycin (CLEOCIN T) 1 % lotion Apply 1 Application topically 2 (two) times daily.   Cranberry (THERACRAN PO) Take 250 mg by mouth in the morning and at bedtime.   docusate sodium (COLACE) 100 MG capsule Take 100 mg by mouth 2 (two) times daily as needed for mild constipation.   Emollient (CERAVE) LOTN Apply topically daily. Apply Cerave lotion to back and extremities   estradiol (ESTRING) 2 MG vaginal ring Place 2 mg vaginally every 3 (three) months. follow package directions   hydrALAZINE (APRESOLINE) 10 MG tablet Take 10 mg by mouth every 8 (eight) hours as needed.   ibuprofen (ADVIL) 200 MG tablet Take 400 mg by mouth 3 (three) times daily as needed for mild pain.  losartan (COZAAR) 25 MG tablet Take 1 tablet (25 mg total) by mouth daily.   nitrofurantoin (MACRODANTIN) 50 MG capsule Take 50 mg by mouth 2 (two) times daily.   polyethylene glycol (MIRALAX / GLYCOLAX) 17 g packet Take 17 g by mouth every other day.   senna (SENOKOT) 8.6 MG TABS tablet Take 2 tablets by mouth in the morning and at  bedtime.   shark liver oil-cocoa butter (PREPARATION H) 0.25-3-85.5 % suppository Place 1 suppository rectally as needed for hemorrhoids.   tamsulosin (FLOMAX) 0.4 MG CAPS capsule Take 0.4 mg by mouth at bedtime.   triamcinolone cream (KENALOG) 0.1 % Apply 1 Application topically 2 (two) times daily. To temple area x 4 weeks   No facility-administered encounter medications on file as of 11/05/2022.    Review of Systems  Unable to perform ROS: Dementia    Immunization History  Administered Date(s) Administered   Influenza, High Dose Seasonal PF 07/25/2021   Influenza-Unspecified 08/05/2018, 09/29/2018, 07/02/2019, 08/21/2019, 08/11/2020, 07/26/2022   Moderna Covid-19 Vaccine Bivalent Booster 74yrs & up 08/01/2021, 08/26/2022   Moderna SARS-COV2 Booster Vaccination 08/31/2020, 06/12/2021   Moderna Sars-Covid-2 Vaccination 11/04/2019, 12/02/2019   Pneumococcal Conjugate-13 09/28/2018   Pneumococcal Polysaccharide-23 10/21/2017   Tdap 02/23/2021   Zoster Recombinat (Shingrix) 05/24/2021   Pertinent  Health Maintenance Due  Topic Date Due   INFLUENZA VACCINE  Completed   DEXA SCAN  Discontinued      10/19/2021    1:02 PM 04/25/2022   11:04 AM 06/03/2022    3:33 PM 10/01/2022    1:09 PM 11/05/2022   10:43 AM  Fall Risk  Falls in the past year? 0   0 0  Was there an injury with Fall? 0   0 0  Fall Risk Category Calculator 0   0 0  Fall Risk Category (Retired) Low   Low   (RETIRED) Patient Fall Risk Level Moderate fall risk High fall risk High fall risk High fall risk   Patient at Risk for Falls Due to Impaired balance/gait   History of fall(s) History of fall(s)  Fall risk Follow up Falls evaluation completed   Falls evaluation completed Falls evaluation completed   Functional Status Survey:    Vitals:   11/05/22 1039  BP: 113/75  Pulse: 73  Resp: 18  Temp: (!) 97.3 F (36.3 C)  SpO2: 92%  Weight: 151 lb 6 oz (68.7 kg)  Height: 5\' 2"  (1.575 m)   Body mass index is 27.69  kg/m. Physical Exam Vitals reviewed.  Constitutional:      General: She is not in acute distress. HENT:     Head: Normocephalic.     Right Ear: There is no impacted cerumen.     Left Ear: There is no impacted cerumen.     Nose: Nose normal.     Mouth/Throat:     Mouth: Mucous membranes are moist.  Eyes:     General:        Right eye: No discharge.        Left eye: No discharge.  Cardiovascular:     Rate and Rhythm: Normal rate and regular rhythm.     Pulses: Normal pulses.     Heart sounds: Normal heart sounds.  Pulmonary:     Effort: Pulmonary effort is normal. No respiratory distress.     Breath sounds: Normal breath sounds. No wheezing.  Abdominal:     General: Bowel sounds are normal. There is no distension.  Palpations: Abdomen is soft.     Tenderness: There is no abdominal tenderness.  Musculoskeletal:     Cervical back: Neck supple.     Right lower leg: No edema.     Left lower leg: No edema.  Skin:    General: Skin is warm and dry.     Capillary Refill: Capillary refill takes less than 2 seconds.  Neurological:     General: No focal deficit present.     Mental Status: She is alert. Mental status is at baseline.     Motor: Weakness present.     Gait: Gait abnormal.     Comments: Rigidity to extremities, ambulates in wheelchair  Psychiatric:        Mood and Affect: Mood normal.        Behavior: Behavior normal.     Labs reviewed: Recent Labs    02/05/22 0000 04/19/22 0000 04/25/22 1154  NA 140 139 139  K 4.0 4.3 4.4  CL 106 103 104  CO2 28* 26* 26  GLUCOSE  --   --  113*  BUN 13 14 13   CREATININE 0.7 0.8 0.89  CALCIUM 9.2 9.4 9.2   Recent Labs    02/05/22 0000  AST 15  ALT 13  ALKPHOS 80  ALBUMIN 3.7   Recent Labs    02/05/22 0000 04/19/22 0000 04/25/22 1154  WBC 6.5 5.9 6.9  NEUTROABS 3.50  --  4.4  HGB 13.2 13.6 13.7  HCT 40 40 42.1  MCV  --   --  89.6  PLT 197 198 178   Lab Results  Component Value Date   TSH 0.942  04/25/2022   No results found for: "HGBA1C" Lab Results  Component Value Date   CHOL 235 (A) 07/20/2020   HDL 100 (A) 07/20/2020   LDLCALC 134 07/20/2020   TRIG 102 07/20/2020    Significant Diagnostic Results in last 30 days:  No results found.  Assessment/Plan 1. Unstable gait - increased rigidity - ambulates with wheelchair - PT evaluation for transfers> recommend hoyer lift in AM  2. Late onset Alzheimer's dementia without behavioral disturbance (HCC) - noncompliant with medications in evening at times - dependent with ADLs except feeding - weights stable - cont skilled nursing  3. Essential hypertension - controlled - cont losartan and hydralazine prn for SBP > 180 - cbc/diff- future - cmp- future  4. Neurogenic bladder - cont Flomax  5. Recurrent UTI - cont nitrofurantoin and cranberry supplement  6. Slow transit constipation - abdomen soft - cont colace, senna and miralax    Family/ staff Communication: plan discussed with patient and nurse  Labs/tests ordered:  cbc/diff, cmp

## 2022-11-07 LAB — BASIC METABOLIC PANEL
BUN: 19 (ref 4–21)
CO2: 24 — AB (ref 13–22)
Chloride: 104 (ref 99–108)
Creatinine: 0.7 (ref 0.5–1.1)
Glucose: 103
Potassium: 4.3 mEq/L (ref 3.5–5.1)
Sodium: 137 (ref 137–147)

## 2022-11-07 LAB — CBC AND DIFFERENTIAL
HCT: 38 (ref 36–46)
Hemoglobin: 12.5 (ref 12.0–16.0)
Platelets: 180 10*3/uL (ref 150–400)
WBC: 8.9

## 2022-11-07 LAB — COMPREHENSIVE METABOLIC PANEL
Albumin: 3.4 — AB (ref 3.5–5.0)
Calcium: 9 (ref 8.7–10.7)
Globulin: 2.4
eGFR: 86

## 2022-11-07 LAB — CBC: RBC: 4.18 (ref 3.87–5.11)

## 2022-11-15 ENCOUNTER — Encounter: Payer: Self-pay | Admitting: Adult Health

## 2022-11-16 NOTE — Progress Notes (Signed)
This encounter was created in error - please disregard.

## 2022-11-22 ENCOUNTER — Encounter: Payer: Self-pay | Admitting: Adult Health

## 2022-11-22 ENCOUNTER — Non-Acute Institutional Stay (SKILLED_NURSING_FACILITY): Payer: Medicare Other | Admitting: Adult Health

## 2022-11-22 DIAGNOSIS — Z0000 Encounter for general adult medical examination without abnormal findings: Secondary | ICD-10-CM | POA: Diagnosis not present

## 2022-11-22 NOTE — Progress Notes (Signed)
Subjective:   Cheryl Monroe is a 88 y.o. female who presents for Medicare Annual (Subsequent) preventive examination at [facility] in skilled care Review of Systems     Cardiac Risk Factors include: advanced age (>36men, >27 women);hypertension;sedentary lifestyle     Objective:    Today's Vitals   11/22/22 1128  Weight: 151 lb (68.5 kg)   Body mass index is 27.62 kg/m.     11/15/2022   11:09 AM 11/05/2022   10:44 AM 10/01/2022    1:38 PM 07/01/2022   11:30 AM 05/20/2022   11:22 AM 04/18/2022    2:50 PM 04/15/2022   10:17 AM  Advanced Directives  Does Patient Have a Medical Advance Directive? Yes Yes Yes Yes Yes Yes Yes  Type of Advance Directive Living will;Out of facility DNR (pink MOST or yellow form);Healthcare Power of Attorney Living will;Out of facility DNR (pink MOST or yellow form);Healthcare Power of Attorney Living will;Out of facility DNR (pink MOST or yellow form);Healthcare Power of [place];Living will;Out of facility DNR (pink MOST or yellow form) Living will;Out of facility DNR (pink MOST or yellow form) Living will;Out of facility DNR (pink MOST or yellow form) Out of facility DNR (pink MOST or yellow form);Living will  Does patient want to make changes to medical advance directive? No - Patient declined No - Patient declined No - Patient declined No - Patient declined No - Patient declined No - Patient declined No - Patient declined  Copy of [place] in Chart? Yes - validated most recent copy scanned in chart (See row information) Yes - validated most recent copy scanned in chart (See row information) Yes - validated most recent copy scanned in chart (See row information) No - copy requested       Current Medications (verified) Outpatient Encounter Medications as of 11/22/2022  Medication Sig   acetaminophen (TYLENOL) 500 MG tablet Take 2 tablets (1,000 mg total) by mouth in the morning.    acetaminophen (TYLENOL) 500 MG tablet Take 500 mg by mouth 3 (three) times daily as needed.   clindamycin (CLEOCIN T) 1 % lotion Apply 1 Application topically 2 (two) times daily. (Patient not taking: Reported on 11/15/2022)   Cranberry (THERACRAN PO) Take 250 mg by mouth in the morning and at bedtime.   docusate sodium (COLACE) 100 MG capsule Take 100 mg by mouth 2 (two) times daily as needed for mild constipation.   Emollient (CERAVE) LOTN Apply topically daily. Apply Cerave lotion to back and extremities   estradiol (ESTRING) 2 MG vaginal ring Place 2 mg vaginally every 3 (three) months. follow package directions   hydrALAZINE (APRESOLINE) 10 MG tablet Take 10 mg by mouth every 8 (eight) hours as needed.   ibuprofen (ADVIL) 200 MG tablet Take 400 mg by mouth 3 (three) times daily as needed for mild pain.   losartan (COZAAR) 25 MG tablet Take 1 tablet (25 mg total) by mouth daily.   nitrofurantoin (MACRODANTIN) 50 MG capsule Take 50 mg by mouth 2 (two) times daily.   polyethylene glycol (MIRALAX / GLYCOLAX) 17 g packet Take 17 g by mouth every other day.   senna (SENOKOT) 8.6 MG TABS tablet Take 2 tablets by mouth in the morning and at bedtime.   shark liver oil-cocoa butter (PREPARATION H) 0.25-3-85.5 % suppository Place 1 suppository rectally as needed for hemorrhoids.   tamsulosin (FLOMAX) 0.4 MG CAPS capsule Take 0.4 mg by mouth at bedtime.   triamcinolone cream (  KENALOG) 0.1 % Apply 1 Application topically 2 (two) times daily. To temple area x 4 weeks   No facility-administered encounter medications on file as of 11/22/2022.    Allergies (verified) Sulfa antibiotics   History: Past Medical History:  Diagnosis Date   Alzheimer disease ([place])    Balance problem 07/19/2020   Constipation 07/19/2020   Dementia without behavioral disturbance ([place]) 07/19/2020   MMSE 21/30 07/21/20   Fall    Osteoarthritis    Pyelonephritis    Spinal stenosis 07/19/2020   Weakness of left lower extremity  07/19/2020   History reviewed. No pertinent surgical history. Family History  Problem Relation Age of Onset   ALS Mother    Social History   Socioeconomic History   Marital status: Legally Separated    Spouse name: Not on file   Number of children: 3   Years of education: Not on file   Highest education level: Some college, no degree  Occupational History   Not on file  Tobacco Use   Smoking status: Never   Smokeless tobacco: Never  Vaping Use   Vaping Use: Never used  Substance and Sexual Activity   Alcohol use: Not Currently   Drug use: Not Currently   Sexual activity: Not on file  Other Topics Concern   Not on file  Social History Narrative   Not on file   Social Determinants of Health   Financial Resource Strain: Not on file  Food Insecurity: Not on file  Transportation Needs: Not on file  Physical Activity: Not on file  Stress: Not on file  Social Connections: Not on file    Tobacco Counseling Counseling given: Not Answered   Clinical Intake:  Pre-visit preparation completed: No  Pain : No/denies pain     BMI - recorded: 27.62 Nutritional Status: BMI 25 -29 Overweight Nutritional Risks: None Diabetes: No  How often do you need to have someone help you when you read instructions, pamphlets, or other written materials from your doctor or pharmacy?: 4 - Often What is the last grade level you completed in school?: had trouble getting her words , she thinks she went to college  Diabetic?no  Interpreter Needed?: No      Activities of Daily Living    11/22/2022   11:33 AM  In your present state of health, do you have any difficulty performing the following activities:  Hearing? 0  Vision? 1  Walking or climbing stairs? 1  Dressing or bathing? 1  Doing errands, shopping? 1  Preparing Food and eating ? Y  Using the Toilet? Y  In the past six months, have you accidently leaked urine? Y  Do you have problems with loss of bowel control? Y   Managing your Medications? Y  Managing your Finances? Y  Housekeeping or managing your Housekeeping? Y    Patient Care Team: Virgie Dad, MD as PCP - General (Internal Medicine) Domingo Pulse, MD (Urology)  Indicate any recent Medical Services you may have received from other than [facility] providers in the past year (date may be approximate).     Assessment:   This is a routine wellness examination for Amour.  Hearing/Vision screen No results found.  Dietary issues and exercise activities discussed: Current Exercise Habits: The patient does not participate in regular exercise at present, Exercise limited by: orthopedic condition(s)   Goals Addressed             This Visit's Progress    Social and Functional  Skills Optimized       Evidence-based guidance:  Assess level of social support; promote maintaining links with family, friends and community to reduce social isolation.  Assess level of function related to basic activities of daily living that include eating, dressing, bathing, as well as instrumental activities of daily living such as shopping, managing finances and use of devices.  Encourage continuation of daily life components such as self-care, home maintenance, financial management, volunteer activities, education opportunities and hobbies.  Refer to occupational or physical therapy to develop comprehensive rehabilitation plan to improve or maintain activities of daily living; consider inclusion of endurance, balance and resistance-training.  Consider complementary therapy such as yoga, music, gardening, outdoor activities, aromatherapy and tai chi.   Notes:        Depression Screen    11/22/2022   11:33 AM 11/15/2022   11:09 AM 11/05/2022   10:43 AM 10/01/2022    1:36 PM 10/01/2022    1:09 PM 10/19/2021    1:02 PM 07/19/2020   10:07 AM  PHQ 2/9 Scores  PHQ - 2 Score 1 0 0 0 0 0 0    Fall Risk    11/05/2022   10:43 AM 10/01/2022    1:09 PM  10/19/2021    1:02 PM 07/19/2020   10:06 AM  Fall Risk   Falls in the past year? 0 0 0 1  Number falls in past yr: 0 0 0 0  Injury with Fall? 0 0 0 0  Risk for fall due to : History of fall(s) History of fall(s) Impaired balance/gait   Follow up Falls evaluation completed Falls evaluation completed Falls evaluation completed     FALL RISK PREVENTION PERTAINING TO THE HOME:  Any stairs in or around the home? No  If so, are there any without handrails? No  Home free of loose throw rugs in walkways, pet beds, electrical cords, etc? Yes  Adequate lighting in your home to reduce risk of falls? Yes   ASSISTIVE DEVICES UTILIZED TO PREVENT FALLS:  Life alert? No  Use of a cane, walker or w/c? Yes  Grab bars in the bathroom? Yes  Shower chair or bench in shower? Yes  Elevated toilet seat or a handicapped toilet? Yes   TIMED UP AND GO:  Was the test performed? No .  Length of time to ambulate 10 feet: fall risk sec.   Gait slow and steady without use of assistive device  Cognitive Function:        11/22/2022   11:35 AM  6CIT Screen  What Year? 4 points  What month? 3 points  What time? 3 points  Count back from 20 4 points  Months in reverse 4 points    Immunizations Immunization History  Administered Date(s) Administered   Influenza, High Dose Seasonal PF 07/25/2021   Influenza-Unspecified 08/05/2018, 09/29/2018, 07/02/2019, 08/21/2019, 08/11/2020, 07/26/2022   Moderna Covid-19 Vaccine Bivalent Booster 86yrs & up 08/01/2021, 08/26/2022   Moderna SARS-COV2 Booster Vaccination 08/31/2020, 06/12/2021   Moderna Sars-Covid-2 Vaccination 11/04/2019, 12/02/2019   Pneumococcal Conjugate-13 09/28/2018   Pneumococcal Polysaccharide-23 10/21/2017   Tdap 02/23/2021   Zoster Recombinat (Shingrix) 05/24/2021, 07/24/2021    TDAP status: Up to date  Flu Vaccine status: Up to date  Pneumococcal vaccine status: Up to date  Covid-19 vaccine status: Completed vaccines  Qualifies  for Shingles Vaccine? Yes   Zostavax completed Yes   Shingrix Completed?: Yes  Screening Tests Health Maintenance  Topic Date Due   COVID-19 Vaccine (5 -  2023-24 season) 12/01/2022 (Originally 10/21/2022)   Medicare Annual Wellness (AWV)  11/23/2023   DTaP/Tdap/Td (2 - Td or Tdap) 02/24/2031   Pneumonia Vaccine 21+ Years old  Completed   INFLUENZA VACCINE  Completed   Zoster Vaccines- Shingrix  Completed   HPV VACCINES  Aged Out   DEXA SCAN  Discontinued    Health Maintenance  There are no preventive care reminders to display for this patient.   Colorectal cancer screening: No longer required.   Mammogram status: No longer required due to age.  Bone Density status: Completed na. Results reflect: Bone density results: NORMAL. Repeat every na years. Pt has dementia, is minimally ambulatory and resides in skilled care. Additional testing would not benefit her at this time.   Lung Cancer Screening: (Low Dose CT Chest recommended if Age 24-80 years, 30 pack-year currently smoking OR have quit w/in 15years.) does not qualify.   Lung Cancer Screening Referral: na  Additional Screening:  Hepatitis C Screening: does not qualify; Completed na  Vision Screening: Recommended annual ophthalmology exams for early detection of glaucoma and other disorders of the eye. Is the patient up to date with their annual eye exam?  Yes  Who is the provider or what is the name of the office in which the patient attends annual eye exams?  If pt is not established with a provider, would they like to be referred to a provider to establish care? No .   Dental Screening: Recommended annual dental exams for proper oral hygiene  Community Resource Referral / Chronic Care Management: CRR required this visit?  No   CCM required this visit?  No      Plan:     I have personally reviewed and noted the following in the patient's chart:   Medical and social history Use of alcohol, tobacco or illicit  drugs  Current medications and supplements including opioid prescriptions. Patient is not currently taking opioid prescriptions. Functional ability and status Nutritional status Physical activity Advanced directives List of other physicians Hospitalizations, surgeries, and ER visits in previous 12 months Vitals Screenings to include cognitive, depression, and falls Referrals and appointments  In addition, I have reviewed and discussed with patient certain preventive protocols, quality metrics, and best practice recommendations. A written personalized care plan for preventive services as well as general preventive health recommendations were provided to patient.     Royal Hawthorn, NP   11/22/2022   Nurse Notes: pt has dementia and needed help with answering questions and some information was obtained from the chart

## 2022-11-22 NOTE — Patient Instructions (Signed)
Cheryl Monroe , Thank you for taking time to come for your Medicare Wellness Visit. I appreciate your ongoing commitment to your health goals. Please review the following plan we discussed and let me know if I can assist you in the future.   Screening recommendations/referrals: Colonoscopy aged out Mammogram aged out Bone Density not indicated Recommended yearly ophthalmology/optometry visit for glaucoma screening and checkup Recommended yearly dental visit for hygiene and checkup  Vaccinations: Influenza vaccine- due annually in September/October Pneumococcal vaccine up to date Tdap vaccine up to date Shingles vaccine up to date    Advanced directives: reviewed  Conditions/risks identified: fall risk due to unsteady gait. No recent injuries  Next appointment: 1 year   Preventive Care 86 Years and Older, Female Preventive care refers to lifestyle choices and visits with your health care provider that can promote health and wellness. What does preventive care include? A yearly physical exam. This is also called an annual well check. Dental exams once or twice a year. Routine eye exams. Ask your health care provider how often you should have your eyes checked. Personal lifestyle choices, including: Daily care of your teeth and gums. Regular physical activity. Eating a healthy diet. Avoiding tobacco and drug use. Limiting alcohol use. Practicing safe sex. Taking low-dose aspirin every day. Taking vitamin and mineral supplements as recommended by your health care provider. What happens during an annual well check? The services and screenings done by your health care provider during your annual well check will depend on your age, overall health, lifestyle risk factors, and family history of disease. Counseling  Your health care provider may ask you questions about your: Alcohol use. Tobacco use. Drug use. Emotional well-being. Home and relationship well-being. Sexual  activity. Eating habits. History of falls. Memory and ability to understand (cognition). Work and work Statistician. Reproductive health. Screening  You may have the following tests or measurements: Height, weight, and BMI. Blood pressure. Lipid and cholesterol levels. These may be checked every 5 years, or more frequently if you are over 86 years old. Skin check. Lung cancer screening. You may have this screening every year starting at age 86 if you have a 30-pack-year history of smoking and currently smoke or have quit within the past 15 years. Fecal occult blood test (FOBT) of the stool. You may have this test every year starting at age 86. Flexible sigmoidoscopy or colonoscopy. You may have a sigmoidoscopy every 5 years or a colonoscopy every 10 years starting at age 86. Hepatitis C blood test. Hepatitis B blood test. Sexually transmitted disease (STD) testing. Diabetes screening. This is done by checking your blood sugar (glucose) after you have not eaten for a while (fasting). You may have this done every 1-3 years. Bone density scan. This is done to screen for osteoporosis. You may have this done starting at age 86. Mammogram. This may be done every 86-2 years. Talk to your health care provider about how often you should have regular mammograms. Talk with your health care provider about your test results, treatment options, and if necessary, the need for more tests. Vaccines  Your health care provider may recommend certain vaccines, such as: Influenza vaccine. This is recommended every year. Tetanus, diphtheria, and acellular pertussis (Tdap, Td) vaccine. You may need a Td booster every 10 years. Zoster vaccine. You may need this after age 86. Pneumococcal 13-valent conjugate (PCV13) vaccine. One dose is recommended after age 86. Pneumococcal polysaccharide (PPSV23) vaccine. One dose is recommended after age 86. Talk to  your health care provider about which screenings and vaccines  you need and how often you need them. This information is not intended to replace advice given to you by your health care provider. Make sure you discuss any questions you have with your health care provider. Document Released: 11/03/2015 Document Revised: 06/26/2016 Document Reviewed: 08/08/2015 Elsevier Interactive Patient Education  2017 [place] Prevention in the Home Falls can cause injuries. They can happen to people of all ages. There are many things you can do to make your home safe and to help prevent falls. What can I do on the outside of my home? Regularly fix the edges of walkways and driveways and fix any cracks. Remove anything that might make you trip as you walk through a door, such as a raised step or threshold. Trim any bushes or trees on the path to your home. Use bright outdoor lighting. Clear any walking paths of anything that might make someone trip, such as rocks or tools. Regularly check to see if handrails are loose or broken. Make sure that both sides of any steps have handrails. Any raised decks and porches should have guardrails on the edges. Have any leaves, snow, or ice cleared regularly. Use sand or salt on walking paths during winter. Clean up any spills in your garage right away. This includes oil or grease spills. What can I do in the bathroom? Use night lights. Install grab bars by the toilet and in the tub and shower. Do not use towel bars as grab bars. Use non-skid mats or decals in the tub or shower. If you need to sit down in the shower, use a plastic, non-slip stool. Keep the floor dry. Clean up any water that spills on the floor as soon as it happens. Remove soap buildup in the tub or shower regularly. Attach bath mats securely with double-sided non-slip rug tape. Do not have throw rugs and other things on the floor that can make you trip. What can I do in the bedroom? Use night lights. Make sure that you have a light by your bed that  is easy to reach. Do not use any sheets or blankets that are too big for your bed. They should not hang down onto the floor. Have a firm chair that has side arms. You can use this for support while you get dressed. Do not have throw rugs and other things on the floor that can make you trip. What can I do in the kitchen? Clean up any spills right away. Avoid walking on wet floors. Keep items that you use a lot in easy-to-reach places. If you need to reach something above you, use a strong step stool that has a grab bar. Keep electrical cords out of the way. Do not use floor polish or wax that makes floors slippery. If you must use wax, use non-skid floor wax. Do not have throw rugs and other things on the floor that can make you trip. What can I do with my stairs? Do not leave any items on the stairs. Make sure that there are handrails on both sides of the stairs and use them. Fix handrails that are broken or loose. Make sure that handrails are as long as the stairways. Check any carpeting to make sure that it is firmly attached to the stairs. Fix any carpet that is loose or worn. Avoid having throw rugs at the top or bottom of the stairs. If you do have throw rugs, attach  them to the floor with carpet tape. Make sure that you have a light switch at the top of the stairs and the bottom of the stairs. If you do not have them, ask someone to add them for you. What else can I do to help prevent falls? Wear shoes that: Do not have high heels. Have rubber bottoms. Are comfortable and fit you well. Are closed at the toe. Do not wear sandals. If you use a stepladder: Make sure that it is fully opened. Do not climb a closed stepladder. Make sure that both sides of the stepladder are locked into place. Ask someone to hold it for you, if possible. Clearly mark and make sure that you can see: Any grab bars or handrails. First and last steps. Where the edge of each step is. Use tools that help you  move around (mobility aids) if they are needed. These include: Canes. Walkers. Scooters. Crutches. Turn on the lights when you go into a dark area. Replace any light bulbs as soon as they burn out. Set up your furniture so you have a clear path. Avoid moving your furniture around. If any of your floors are uneven, fix them. If there are any pets around you, be aware of where they are. Review your medicines with your doctor. Some medicines can make you feel dizzy. This can increase your chance of falling. Ask your doctor what other things that you can do to help prevent falls. This information is not intended to replace advice given to you by your health care provider. Make sure you discuss any questions you have with your health care provider. Document Released: 08/03/2009 Document Revised: 03/14/2016 Document Reviewed: 11/11/2014 Elsevier Interactive Patient Education  2017 [employer].

## 2022-12-05 ENCOUNTER — Non-Acute Institutional Stay (SKILLED_NURSING_FACILITY): Payer: Medicare Other | Admitting: Adult Health

## 2022-12-05 ENCOUNTER — Encounter: Payer: Self-pay | Admitting: Adult Health

## 2022-12-05 DIAGNOSIS — I10 Essential (primary) hypertension: Secondary | ICD-10-CM

## 2022-12-05 DIAGNOSIS — R531 Weakness: Secondary | ICD-10-CM

## 2022-12-05 DIAGNOSIS — R4789 Other speech disturbances: Secondary | ICD-10-CM

## 2022-12-05 DIAGNOSIS — N319 Neuromuscular dysfunction of bladder, unspecified: Secondary | ICD-10-CM

## 2022-12-05 DIAGNOSIS — F0390 Unspecified dementia without behavioral disturbance: Secondary | ICD-10-CM

## 2022-12-05 DIAGNOSIS — R600 Localized edema: Secondary | ICD-10-CM

## 2022-12-05 NOTE — Progress Notes (Signed)
Location:  [facility] Room Number: 140A Place of Service:  SNF [phone]) Provider:  Royal Hawthorn, NP    Patient Care Team: Virgie Dad, MD as PCP - General (Internal Medicine) Domingo Pulse, MD (Urology)  Extended Emergency Contact Information Primary Emergency Contact: Omlor,[name] Address: [address] [place]          [place], [place] [address] Johnnette Litter of [place] Phone: [phone] Work Phone: [phone] Relation: Son Secondary Emergency Contact: Joni Fears [place] of [place] Phone: [phone] Work Phone: [phone] Mobile Phone: [phone] Relation: Son  Code Status:  DNR  Goals of care: Advanced Directive information    12/05/2022    1:54 PM  Advanced Directives  Does Patient Have a Medical Advance Directive? Yes  Type of Paramedic of [place];Living will;Out of facility DNR (pink MOST or yellow form)  Does patient want to make changes to medical advance directive? No - Patient declined  Copy of [place] in Chart? Yes - validated most recent copy scanned in chart (See row information)  Pre-existing out of facility DNR order (yellow form or pink MOST form) Yellow form placed in chart (order not valid for inpatient use)     Chief Complaint  Patient presents with   Medical Management of Chronic Issues    Routine Visit    HPI:  Pt is a 88 y.o. female seen today for medical management of chronic diseases.    PMH includes HTN, Dementia without behavioral disturbances, orthostatic dizziness, hx of neurogenic bladder with incomplete emptying, recurrent UTI, urosepsis, spinal stenosis, constipation, gait abnormality  AD: MMSE 11/30. Continues to remain pleasant.  Has periods of refusal of care per nursing notes. No aggression. CIT score 18 11/22/22 MRI ordered and results 11/9 revealed no evidence of acute intracranial abnormality, Moderate chronic small  vessel ischemic changes within the cerebral white matter. Has been weaker over time, needing more help. Able to feed herself. Needs help with transfer and adl care.   Neurogenic bladder and urinary incontinence: no urinary symptoms. No issues emptying reported. Continues on flomax. Has estring. UTI prevention with macrobid.  HTN: on losartan Has hx of orthostatic hypotension  She is now using a wheelchair most of the time.  BP labile [id number] systolic.  No recent syncope  Constipation: LBM 2/14    Past Medical History:  Diagnosis Date   Alzheimer disease ([place])    Balance problem 07/19/2020   Constipation 07/19/2020   Dementia without behavioral disturbance ([place]) 07/19/2020   MMSE 21/30 07/21/20   Fall    Osteoarthritis    Pyelonephritis    Spinal stenosis 07/19/2020   Weakness of left lower extremity 07/19/2020   History reviewed. No pertinent surgical history.  Allergies  Allergen Reactions   Sulfa Antibiotics Rash    Rash to trunk, legs, neck, scalp, and arms    Outpatient Encounter Medications as of 12/05/2022  Medication Sig   acetaminophen (TYLENOL) 500 MG tablet Take 2 tablets (1,000 mg total) by mouth in the morning.   acetaminophen (TYLENOL) 500 MG tablet Take 500 mg by mouth 3 (three) times daily as needed.   Cranberry (THERACRAN PO) Take 250 mg by mouth in the morning and at bedtime.   docusate sodium (COLACE) 100 MG capsule Take 100 mg by mouth 2 (two) times daily as needed for mild constipation.   Emollient (CERAVE) LOTN Apply topically daily. Apply Cerave lotion to back and extremities   estradiol (  ESTRING) 2 MG vaginal ring Place 2 mg vaginally every 3 (three) months. follow package directions   hydrALAZINE (APRESOLINE) 10 MG tablet Take 10 mg by mouth every 8 (eight) hours as needed.   ibuprofen (ADVIL) 200 MG tablet Take 400 mg by mouth 3 (three) times daily as needed for mild pain.   losartan (COZAAR) 25 MG tablet Take 1 tablet (25 mg total) by mouth daily.    nitrofurantoin (MACRODANTIN) 50 MG capsule Take 50 mg by mouth 2 (two) times daily.   polyethylene glycol (MIRALAX / GLYCOLAX) 17 g packet Take 17 g by mouth every other day.   senna (SENOKOT) 8.6 MG TABS tablet Take 2 tablets by mouth in the morning and at bedtime.   shark liver oil-cocoa butter (PREPARATION H) 0.25-3-85.5 % suppository Place 1 suppository rectally as needed for hemorrhoids.   tamsulosin (FLOMAX) 0.4 MG CAPS capsule Take 0.4 mg by mouth at bedtime.   triamcinolone cream (KENALOG) 0.1 % Apply 1 Application topically 2 (two) times daily. To temple area x 4 weeks   [DISCONTINUED] clindamycin (CLEOCIN T) 1 % lotion Apply 1 Application topically 2 (two) times daily. (Patient not taking: Reported on 11/15/2022)   No facility-administered encounter medications on file as of 12/05/2022.    Review of Systems  Constitutional:  Negative for activity change, appetite change, chills, diaphoresis, fatigue, fever and unexpected weight change.  HENT:  Negative for congestion.   Respiratory:  Negative for cough, shortness of breath and wheezing.   Cardiovascular:  Negative for chest pain, palpitations and leg swelling.  Gastrointestinal:  Negative for abdominal distention, abdominal pain, constipation and diarrhea.  Genitourinary:  Negative for difficulty urinating and dysuria.  Musculoskeletal:  Positive for gait problem. Negative for arthralgias, back pain, joint swelling and myalgias.  Neurological:  Negative for dizziness, tremors, seizures, syncope, facial asymmetry, speech difficulty, weakness, light-headedness, numbness and headaches.  Psychiatric/Behavioral:  Positive for confusion. Negative for agitation and behavioral problems.     Immunization History  Administered Date(s) Administered   Influenza, High Dose Seasonal PF 07/25/2021   Influenza-Unspecified 08/05/2018, 09/29/2018, 07/02/2019, 08/21/2019, 08/11/2020, 07/26/2022   Moderna Covid-19 Vaccine Bivalent Booster 68yr & up  08/01/2021, 08/26/2022   Moderna SARS-COV2 Booster Vaccination 08/31/2020, 06/12/2021   Moderna Sars-Covid-2 Vaccination 11/04/2019, 12/02/2019   Pneumococcal Conjugate-13 09/28/2018   Pneumococcal Polysaccharide-23 10/21/2017   Tdap 02/23/2021   Zoster Recombinat (Shingrix) 05/24/2021, 07/24/2021   Pertinent  Health Maintenance Due  Topic Date Due   INFLUENZA VACCINE  Completed   DEXA SCAN  Discontinued      04/25/2022   11:04 AM 06/03/2022    3:33 PM 10/01/2022    1:09 PM 11/05/2022   10:43 AM 12/05/2022    1:51 PM  Fall Risk  Falls in the past year?   0 0 0  Was there an injury with Fall?   0 0 0  Fall Risk Category Calculator   0 0 0  Fall Risk Category (Retired)   Low    (RETIRED) Patient Fall Risk Level High fall risk High fall risk High fall risk    Patient at Risk for Falls Due to   History of fall(s) History of fall(s) History of fall(s)  Fall risk Follow up   Falls evaluation completed Falls evaluation completed Falls evaluation completed   Functional Status Survey:    Vitals:   12/05/22 1341  BP: (!) 145/83  Pulse: 73  Resp: 18  Temp: 97.8 F (36.6 C)  SpO2: 99%  Weight: 147 lb  6 oz (66.8 kg)  Height: 5' 2"$  (1.575 m)   Body mass index is 26.96 kg/m. Wt Readings from Last 3 Encounters:  12/05/22 147 lb 6 oz (66.8 kg)  11/22/22 151 lb (68.5 kg)  11/15/22 151 lb 9.6 oz (68.8 kg)    Physical Exam Vitals and nursing note reviewed.  Constitutional:      General: She is not in acute distress.    Appearance: She is not diaphoretic.  HENT:     Head: Normocephalic and atraumatic.  Neck:     Vascular: No JVD.  Cardiovascular:     Rate and Rhythm: Normal rate and regular rhythm.     Heart sounds: No murmur heard. Pulmonary:     Effort: Pulmonary effort is normal. No respiratory distress.     Breath sounds: Normal breath sounds. No wheezing.  Abdominal:     General: Bowel sounds are normal. There is no distension.     Palpations: Abdomen is soft.      Tenderness: There is no abdominal tenderness.  Musculoskeletal:     Comments: +1 edema Bilat  Skin:    General: Skin is warm and dry.  Neurological:     General: No focal deficit present.     Mental Status: She is alert. Mental status is at baseline.     Labs reviewed: Recent Labs    04/19/22 0000 04/25/22 1154 11/07/22 0000  NA 139 139 137  K 4.3 4.4 4.3  CL 103 104 104  CO2 26* 26 24*  GLUCOSE  --  113*  --   BUN 14 13 19  $ CREATININE 0.8 0.89 0.7  CALCIUM 9.4 9.2 9.0   Recent Labs    02/05/22 0000 11/07/22 0000  AST 15  --   ALT 13  --   ALKPHOS 80  --   ALBUMIN 3.7 3.4*   Recent Labs    02/05/22 0000 04/19/22 0000 04/25/22 1154 11/07/22 0000  WBC 6.5 5.9 6.9 8.9  NEUTROABS 3.50  --  4.4  --   HGB 13.2 13.6 13.7 12.5  HCT 40 40 42.1 38  MCV  --   --  89.6  --   PLT 197 198 178 180   Lab Results  Component Value Date   TSH 0.942 04/25/2022   No results found for: "HGBA1C" Lab Results  Component Value Date   CHOL 235 (A) 07/20/2020   HDL 100 (A) 07/20/2020   LDLCALC 134 07/20/2020   TRIG 102 07/20/2020    Significant Diagnostic Results in last 30 days:  No results found.  Assessment/Plan  1. Dementia without behavioral disturbance ([place]) Progressive decline in cognition and physical function c/w the disease. Continue supportive care in the skilled environment. More word finding issues.   2. Essential hypertension Controlled continue losartan   3. Word finding difficulty Noted to be slightly worse over time.  4. Neurogenic bladder Has seen urology, on flomax.   5. Weakness Occurred over time.   Likely due to progressive dementia.  Labs ok  6. Localized edema Mild Keep elevated.   Labs/tests ordered:  NA

## 2023-01-13 ENCOUNTER — Non-Acute Institutional Stay (SKILLED_NURSING_FACILITY): Payer: Medicare Other | Admitting: Internal Medicine

## 2023-01-13 ENCOUNTER — Encounter: Payer: Self-pay | Admitting: Internal Medicine

## 2023-01-13 DIAGNOSIS — N390 Urinary tract infection, site not specified: Secondary | ICD-10-CM

## 2023-01-13 DIAGNOSIS — R0989 Other specified symptoms and signs involving the circulatory and respiratory systems: Secondary | ICD-10-CM | POA: Diagnosis not present

## 2023-01-13 DIAGNOSIS — R2681 Unsteadiness on feet: Secondary | ICD-10-CM | POA: Diagnosis not present

## 2023-01-13 DIAGNOSIS — F0280 Dementia in other diseases classified elsewhere without behavioral disturbance: Secondary | ICD-10-CM

## 2023-01-13 DIAGNOSIS — N319 Neuromuscular dysfunction of bladder, unspecified: Secondary | ICD-10-CM | POA: Diagnosis not present

## 2023-01-13 DIAGNOSIS — G301 Alzheimer's disease with late onset: Secondary | ICD-10-CM

## 2023-01-13 NOTE — Progress Notes (Signed)
Location:  Occupational psychologist of Service:  SNF (31)  Provider: Virgie Dad   Code Status: DNR Goals of Care:     12/05/2022    1:54 PM  Advanced Directives  Does Patient Have a Medical Advance Directive? Yes  Type of Paramedic of [place];Living will;Out of facility DNR (pink MOST or yellow form)  Does patient want to make changes to medical advance directive? No - Patient declined  Copy of [place] in Chart? Yes - validated most recent copy scanned in chart (See row information)  Pre-existing out of facility DNR order (yellow form or pink MOST form) Yellow form placed in chart (order not valid for inpatient use)     Chief Complaint  Patient presents with   Chronic Care Management    HPI: Patient is a 88 y.o. female seen today for medical management of chronic diseases.     Lives in SNF   Patient has a history of Alzheimer's dementia with Aphasia, hypertension, orthostatic hypotension She also has history of recurrent UTIs and urinary incontinence  Also h/o Orthostatic BP with Wide variation SBP Can vary from 170-90  She  is stable.No new Nursing issues. No Behavior issues Her weight is stable She is Stand and Pivot Stays in Wheelchair most of the time Does not walk anymore Aphasia is Worse No Falls Wt Readings from Last 3 Encounters:  01/13/23 151 lb (68.5 kg)  12/05/22 147 lb 6 oz (66.8 kg)  11/22/22 151 lb (68.5 kg)    Past Medical History:  Diagnosis Date   Alzheimer disease ([place])    Balance problem 07/19/2020   Constipation 07/19/2020   Dementia without behavioral disturbance ([place]) 07/19/2020   MMSE 21/30 07/21/20   Fall    Osteoarthritis    Pyelonephritis    Spinal stenosis 07/19/2020   Weakness of left lower extremity 07/19/2020    No past surgical history on file.  Allergies  Allergen Reactions   Sulfa Antibiotics Rash    Rash to trunk, legs, neck, scalp, and arms     Outpatient Encounter Medications as of 01/13/2023  Medication Sig   acetaminophen (TYLENOL) 500 MG tablet Take 2 tablets (1,000 mg total) by mouth in the morning.   acetaminophen (TYLENOL) 500 MG tablet Take 500 mg by mouth 3 (three) times daily as needed.   Cranberry (THERACRAN PO) Take 250 mg by mouth in the morning and at bedtime.   docusate sodium (COLACE) 100 MG capsule Take 100 mg by mouth 2 (two) times daily as needed for mild constipation.   Emollient (CERAVE) LOTN Apply topically daily. Apply Cerave lotion to back and extremities   estradiol (ESTRING) 2 MG vaginal ring Place 2 mg vaginally every 3 (three) months. follow package directions   hydrALAZINE (APRESOLINE) 10 MG tablet Take 10 mg by mouth every 8 (eight) hours as needed.   ibuprofen (ADVIL) 200 MG tablet Take 400 mg by mouth 3 (three) times daily as needed for mild pain.   losartan (COZAAR) 25 MG tablet Take 1 tablet (25 mg total) by mouth daily.   nitrofurantoin (MACRODANTIN) 50 MG capsule Take 50 mg by mouth 2 (two) times daily.   polyethylene glycol (MIRALAX / GLYCOLAX) 17 g packet Take 17 g by mouth every other day.   senna (SENOKOT) 8.6 MG TABS tablet Take 2 tablets by mouth in the morning and at bedtime.   shark liver oil-cocoa butter (PREPARATION H) 0.25-3-85.5 % suppository Place 1  suppository rectally as needed for hemorrhoids.   tamsulosin (FLOMAX) 0.4 MG CAPS capsule Take 0.4 mg by mouth at bedtime.   triamcinolone cream (KENALOG) 0.1 % Apply 1 Application topically 2 (two) times daily. To temple area x 4 weeks   No facility-administered encounter medications on file as of 01/13/2023.    Review of Systems:  Review of Systems  Unable to perform ROS: Dementia    Health Maintenance  Topic Date Due   COVID-19 Vaccine (5 - 2023-24 season) 10/21/2022   Medicare Annual Wellness (AWV)  11/23/2023   DTaP/Tdap/Td (2 - Td or Tdap) 02/24/2031   Pneumonia Vaccine 40+ Years old  Completed   INFLUENZA VACCINE   Completed   Zoster Vaccines- Shingrix  Completed   HPV VACCINES  Aged Out   DEXA SCAN  Discontinued    Physical Exam: Vitals:   01/13/23 1427  BP: (!) 148/78  Pulse: 80  Resp: 16  Temp: (!) 97.3 F (36.3 C)  Weight: 151 lb (68.5 kg)   Body mass index is 27.62 kg/m. Physical Exam Vitals reviewed.  Constitutional:      Appearance: Normal appearance.  HENT:     Head: Normocephalic.     Nose: Nose normal.     Mouth/Throat:     Mouth: Mucous membranes are moist.     Pharynx: Oropharynx is clear.  Eyes:     Pupils: Pupils are equal, round, and reactive to light.  Cardiovascular:     Rate and Rhythm: Normal rate and regular rhythm.     Pulses: Normal pulses.     Heart sounds: Normal heart sounds. No murmur heard. Pulmonary:     Effort: Pulmonary effort is normal.     Breath sounds: Normal breath sounds.  Abdominal:     General: Abdomen is flat. Bowel sounds are normal.     Palpations: Abdomen is soft.  Musculoskeletal:        General: No swelling.     Cervical back: Neck supple.  Skin:    General: Skin is warm.  Neurological:     Mental Status: She is alert.     Comments: Aphasia Worse  Psychiatric:        Mood and Affect: Mood normal.        Thought Content: Thought content normal.     Labs reviewed: Basic Metabolic Panel: Recent Labs    04/19/22 0000 04/25/22 1154 04/25/22 1238 11/07/22 0000  NA 139 139  --  137  K 4.3 4.4  --  4.3  CL 103 104  --  104  CO2 26* 26  --  24*  GLUCOSE  --  113*  --   --   BUN 14 13  --  19  CREATININE 0.8 0.89  --  0.7  CALCIUM 9.4 9.2  --  9.0  TSH 1.67  --  0.942  --    Liver Function Tests: Recent Labs    02/05/22 0000 11/07/22 0000  AST 15  --   ALT 13  --   ALKPHOS 80  --   ALBUMIN 3.7 3.4*   No results for input(s): "LIPASE", "AMYLASE" in the last 8760 hours. No results for input(s): "AMMONIA" in the last 8760 hours. CBC: Recent Labs    02/05/22 0000 04/19/22 0000 04/25/22 1154 11/07/22 0000   WBC 6.5 5.9 6.9 8.9  NEUTROABS 3.50  --  4.4  --   HGB 13.2 13.6 13.7 12.5  HCT 40 40 42.1 38  MCV  --   --  89.6  --   PLT 197 198 178 180   Lipid Panel: No results for input(s): "CHOL", "HDL", "LDLCALC", "TRIG", "CHOLHDL", "LDLDIRECT" in the last 8760 hours. No results found for: "HGBA1C"  Procedures since last visit: No results found.  Assessment/Plan 1. Late onset Alzheimer's dementia without behavioral disturbance ([place]) SNF appropriate MRI showed 08/29/21 revealed no evidence of acute intracranial abnormality, Moderate chronic small vessel ischemic changes within the cerebral white matter. Aphasia Worse Continue SNF support  2. Neurogenic bladder Flomax  3. Unstable gait Now mostly Wheelchair  4. Labile hypertension Only on Low dose of Losartan  5. Recurrent UTI macrodantin,Cranberry and Estrace    Labs/tests ordered:  * No order type specified *  Virgie Dad, MD

## 2023-02-03 ENCOUNTER — Non-Acute Institutional Stay (SKILLED_NURSING_FACILITY): Payer: Medicare Other | Admitting: Internal Medicine

## 2023-02-03 ENCOUNTER — Encounter: Payer: Self-pay | Admitting: Internal Medicine

## 2023-02-03 DIAGNOSIS — F0280 Dementia in other diseases classified elsewhere without behavioral disturbance: Secondary | ICD-10-CM

## 2023-02-03 DIAGNOSIS — R051 Acute cough: Secondary | ICD-10-CM

## 2023-02-03 DIAGNOSIS — T17308A Unspecified foreign body in larynx causing other injury, initial encounter: Secondary | ICD-10-CM

## 2023-02-03 DIAGNOSIS — G301 Alzheimer's disease with late onset: Secondary | ICD-10-CM

## 2023-02-03 LAB — COMPREHENSIVE METABOLIC PANEL
Albumin: 3.9 (ref 3.5–5.0)
Calcium: 9.2 (ref 8.7–10.7)

## 2023-02-03 LAB — CBC AND DIFFERENTIAL
HCT: 47 — AB (ref 36–46)
Hemoglobin: 15.4 (ref 12.0–16.0)
WBC: 6.7

## 2023-02-03 LAB — BASIC METABOLIC PANEL
BUN: 14 (ref 4–21)
CO2: 26 — AB (ref 13–22)
Chloride: 104 (ref 99–108)
Creatinine: 0.8 (ref 0.5–1.1)
Potassium: 4.3 mEq/L (ref 3.5–5.1)
Sodium: 138 (ref 137–147)

## 2023-02-03 LAB — CBC: RBC: 5.15 — AB (ref 3.87–5.11)

## 2023-02-03 LAB — HEPATIC FUNCTION PANEL
Alkaline Phosphatase: 96 (ref 25–125)
Bilirubin, Total: 0.7

## 2023-02-03 NOTE — Progress Notes (Signed)
Location: Medical illustrator of Service:  SNF (31)  Provider:   Code Status: DNR Goals of Care:     02/04/2023   10:04 AM  Advanced Directives  Does Patient Have a Medical Advance Directive? Yes  Type of Advance Directive Living will;Out of facility DNR (pink MOST or yellow form)  Does patient want to make changes to medical advance directive? No - Patient declined     Chief Complaint  Patient presents with   Acute Visit    HPI: Patient is a 88 y.o. female seen today for an acute visit for Episode of Choking    Lives in SNF   Patient has a history of Alzheimer's dementia with Aphasia, hypertension, orthostatic hypotension She also has history of recurrent UTIs and urinary incontinence  Also h/o Orthostatic BP with Wide variation SBP Can vary from 170-90  Patient was sitting in her wheelchair was noticed to have Episode of Choking ? On her own Mucus Then coughed up start feeling better  Then was noticed again to be choking mostly not able to clear up her Mucus Her POX is 96 % in RA No Fever  Breathing looks Normal No Chest pain  Past Medical History:  Diagnosis Date   Alzheimer disease    Balance problem 07/19/2020   Constipation 07/19/2020   Dementia without behavioral disturbance 07/19/2020   MMSE 21/30 07/21/20   Fall    Osteoarthritis    Pyelonephritis    Spinal stenosis 07/19/2020   Weakness of left lower extremity 07/19/2020    No past surgical history on file.  Allergies  Allergen Reactions   Sulfa Antibiotics Rash    Rash to trunk, legs, neck, scalp, and arms    No facility-administered encounter medications on file as of 02/03/2023.   Outpatient Encounter Medications as of 02/03/2023  Medication Sig   doxycycline (VIBRAMYCIN) 100 MG capsule Take 100 mg by mouth 2 (two) times daily.   acetaminophen (TYLENOL) 500 MG tablet Take 2 tablets (1,000 mg total) by mouth in the morning.   acetaminophen (TYLENOL) 500 MG tablet Take 500  mg by mouth 2 (two) times daily as needed.   Cranberry (THERACRAN PO) Take 250 mg by mouth 2 (two) times daily.   docusate sodium (COLACE) 100 MG capsule Take 100 mg by mouth 2 (two) times daily as needed for mild constipation.   Emollient (CERAVE) LOTN Apply topically daily. Apply Cerave lotion to back and extremities   estradiol (ESTRING) 2 MG vaginal ring Place 2 mg vaginally every 3 (three) months. follow package directions   hydrALAZINE (APRESOLINE) 10 MG tablet Take 10 mg by mouth every 8 (eight) hours as needed.   ibuprofen (ADVIL) 200 MG tablet Take 400 mg by mouth 3 (three) times daily as needed for mild pain.   losartan (COZAAR) 25 MG tablet Take 1 tablet (25 mg total) by mouth daily.   nitrofurantoin (MACRODANTIN) 50 MG capsule Take 50 mg by mouth 2 (two) times daily.   polyethylene glycol (MIRALAX / GLYCOLAX) 17 g packet Take 17 g by mouth every other day.   senna (SENOKOT) 8.6 MG TABS tablet Take 2 tablets by mouth at bedtime.   shark liver oil-cocoa butter (PREPARATION H) 0.25-3-85.5 % suppository Place 1 suppository rectally as needed for hemorrhoids.   tamsulosin (FLOMAX) 0.4 MG CAPS capsule Take 0.4 mg by mouth at bedtime.   triamcinolone cream (KENALOG) 0.1 % Apply 1 Application topically 2 (two) times daily as needed. To temple area  x 4 weeks    Review of Systems:  Review of Systems  Unable to perform ROS: Dementia    Health Maintenance  Topic Date Due   COVID-19 Vaccine (5 - 2023-24 season) 10/21/2022   INFLUENZA VACCINE  05/22/2023   Medicare Annual Wellness (AWV)  11/23/2023   DTaP/Tdap/Td (2 - Td or Tdap) 02/24/2031   Pneumonia Vaccine 23+ Years old  Completed   Zoster Vaccines- Shingrix  Completed   HPV VACCINES  Aged Out   DEXA SCAN  Discontinued    Physical Exam: Vitals:   02/03/23 2206  BP: (!) 159/79  Pulse: 77  Resp: 18  Temp: 97.7 F (36.5 C)  SpO2: 93%   There is no height or weight on file to calculate BMI. Physical Exam Vitals reviewed.   Constitutional:      Appearance: Normal appearance.  HENT:     Head: Normocephalic.     Nose: Nose normal.     Mouth/Throat:     Mouth: Mucous membranes are moist.     Pharynx: Oropharynx is clear.  Eyes:     Pupils: Pupils are equal, round, and reactive to light.  Cardiovascular:     Rate and Rhythm: Normal rate and regular rhythm.     Pulses: Normal pulses.     Heart sounds: Normal heart sounds. No murmur heard. Pulmonary:     Effort: Pulmonary effort is normal.     Breath sounds: Rales present.  Abdominal:     General: Abdomen is flat. Bowel sounds are normal.     Palpations: Abdomen is soft.  Musculoskeletal:        General: No swelling.     Cervical back: Neck supple.     Comments: Mild swelling  Skin:    General: Skin is warm.  Neurological:     General: No focal deficit present.     Mental Status: She is alert.  Psychiatric:        Mood and Affect: Mood normal.        Thought Content: Thought content normal.     Labs reviewed: Basic Metabolic Panel: Recent Labs    04/19/22 0000 04/25/22 1154 04/25/22 1238 11/07/22 0000 02/03/23 0000  NA 139 139  --  137 138  K 4.3 4.4  --  4.3 4.3  CL 103 104  --  104 104  CO2 26* 26  --  24* 26*  GLUCOSE  --  113*  --   --   --   BUN 14 13  --  19 14  CREATININE 0.8 0.89  --  0.7 0.8  CALCIUM 9.4 9.2  --  9.0 9.2  TSH 1.67  --  0.942  --   --    Liver Function Tests: Recent Labs    02/05/22 0000 11/07/22 0000 02/03/23 0000  AST 15  --   --   ALT 13  --   --   ALKPHOS 80  --  96  ALBUMIN 3.7 3.4* 3.9   No results for input(s): "LIPASE", "AMYLASE" in the last 8760 hours. No results for input(s): "AMMONIA" in the last 8760 hours. CBC: Recent Labs    02/05/22 0000 04/19/22 0000 04/25/22 1154 11/07/22 0000 02/03/23 0000  WBC 6.5 5.9 6.9 8.9 6.7  NEUTROABS 3.50  --  4.4  --   --   HGB 13.2 13.6 13.7 12.5 15.4  HCT 40 40 42.1 38 47*  MCV  --   --  89.6  --   --  PLT 197 198 178 180  --    Lipid  Panel: No results for input(s): "CHOL", "HDL", "LDLCALC", "TRIG", "CHOLHDL", "LDLDIRECT" in the last 8760 hours. No results found for: "HGBA1C"  Procedures since last visit: DG Chest Port 1 View  Result Date: 02/04/2023 CLINICAL DATA:  Cough EXAM: PORTABLE CHEST 1 VIEW COMPARISON:  Chest x-ray dated April 15, 2022 FINDINGS: Rightward patient rotation limits evaluation. Within limitations, cardiac and mediastinal contours are unchanged. New mild bibasilar opacities which are likely due to atelectasis. No evidence of pleural effusion or pneumothorax. IMPRESSION: New mild bibasilar opacities which are likely due to atelectasis. Electronically Signed   By: Allegra Lai M.D.   On: 02/04/2023 21:51    Assessment/Plan 1. Acute cough Covid negative  Stat Chest Xray Showed Left lobe small effusion and Atelectasis CBC Diff showed WBC normal CMP Normal  Could be aspiration Pneumonitis Will start her on Doxycyline 100 mg BID for 7 days Mucinex BID for 3 days Will have ST evaluate if needed 2. Choking, initial encounter Will do Doxycyline for 7 days  3. Late onset Alzheimer's dementia without behavioral disturbance SNF support    Labs/tests ordered:  * No order type specified * Next appt:  Visit date not found

## 2023-02-04 ENCOUNTER — Telehealth: Payer: Self-pay | Admitting: Nurse Practitioner

## 2023-02-04 ENCOUNTER — Inpatient Hospital Stay (HOSPITAL_COMMUNITY)
Admission: EM | Admit: 2023-02-04 | Discharge: 2023-02-11 | DRG: 194 | Disposition: A | Discharge status: Skilled Nursing Facility | Payer: Medicare Other | Source: Skilled Nursing Facility | Attending: Internal Medicine | Admitting: Internal Medicine

## 2023-02-04 ENCOUNTER — Non-Acute Institutional Stay (SKILLED_NURSING_FACILITY): Payer: Medicare Other | Admitting: Orthopedic Surgery

## 2023-02-04 ENCOUNTER — Encounter (HOSPITAL_COMMUNITY): Payer: Self-pay

## 2023-02-04 ENCOUNTER — Emergency Department (HOSPITAL_COMMUNITY): Payer: Medicare Other

## 2023-02-04 ENCOUNTER — Encounter: Payer: Self-pay | Admitting: Orthopedic Surgery

## 2023-02-04 DIAGNOSIS — Z993 Dependence on wheelchair: Secondary | ICD-10-CM

## 2023-02-04 DIAGNOSIS — Z882 Allergy status to sulfonamides status: Secondary | ICD-10-CM

## 2023-02-04 DIAGNOSIS — Z66 Do not resuscitate: Secondary | ICD-10-CM | POA: Diagnosis present

## 2023-02-04 DIAGNOSIS — J9811 Atelectasis: Secondary | ICD-10-CM | POA: Diagnosis present

## 2023-02-04 DIAGNOSIS — J189 Pneumonia, unspecified organism: Secondary | ICD-10-CM | POA: Diagnosis not present

## 2023-02-04 DIAGNOSIS — F0390 Unspecified dementia without behavioral disturbance: Secondary | ICD-10-CM | POA: Diagnosis present

## 2023-02-04 DIAGNOSIS — B9710 Unspecified enterovirus as the cause of diseases classified elsewhere: Secondary | ICD-10-CM | POA: Diagnosis present

## 2023-02-04 DIAGNOSIS — Z6829 Body mass index (BMI) 29.0-29.9, adult: Secondary | ICD-10-CM

## 2023-02-04 DIAGNOSIS — Z87440 Personal history of urinary (tract) infections: Secondary | ICD-10-CM

## 2023-02-04 DIAGNOSIS — B9789 Other viral agents as the cause of diseases classified elsewhere: Secondary | ICD-10-CM | POA: Diagnosis present

## 2023-02-04 DIAGNOSIS — J690 Pneumonitis due to inhalation of food and vomit: Secondary | ICD-10-CM | POA: Diagnosis not present

## 2023-02-04 DIAGNOSIS — R4701 Aphasia: Secondary | ICD-10-CM | POA: Diagnosis present

## 2023-02-04 DIAGNOSIS — I4510 Unspecified right bundle-branch block: Secondary | ICD-10-CM | POA: Diagnosis present

## 2023-02-04 DIAGNOSIS — G309 Alzheimer's disease, unspecified: Secondary | ICD-10-CM | POA: Diagnosis present

## 2023-02-04 DIAGNOSIS — I10 Essential (primary) hypertension: Secondary | ICD-10-CM | POA: Diagnosis present

## 2023-02-04 DIAGNOSIS — Z79899 Other long term (current) drug therapy: Secondary | ICD-10-CM

## 2023-02-04 DIAGNOSIS — R5381 Other malaise: Secondary | ICD-10-CM | POA: Diagnosis present

## 2023-02-04 DIAGNOSIS — N390 Urinary tract infection, site not specified: Secondary | ICD-10-CM | POA: Diagnosis present

## 2023-02-04 DIAGNOSIS — Z713 Dietary counseling and surveillance: Secondary | ICD-10-CM

## 2023-02-04 DIAGNOSIS — R531 Weakness: Secondary | ICD-10-CM

## 2023-02-04 DIAGNOSIS — Z1152 Encounter for screening for COVID-19: Secondary | ICD-10-CM

## 2023-02-04 DIAGNOSIS — J069 Acute upper respiratory infection, unspecified: Secondary | ICD-10-CM | POA: Diagnosis present

## 2023-02-04 DIAGNOSIS — E669 Obesity, unspecified: Secondary | ICD-10-CM | POA: Diagnosis present

## 2023-02-04 DIAGNOSIS — E663 Overweight: Secondary | ICD-10-CM | POA: Diagnosis present

## 2023-02-04 DIAGNOSIS — E876 Hypokalemia: Secondary | ICD-10-CM | POA: Diagnosis present

## 2023-02-04 DIAGNOSIS — Z7409 Other reduced mobility: Secondary | ICD-10-CM | POA: Diagnosis present

## 2023-02-04 DIAGNOSIS — F0280 Dementia in other diseases classified elsewhere without behavioral disturbance: Secondary | ICD-10-CM | POA: Diagnosis present

## 2023-02-04 LAB — CBC WITH DIFFERENTIAL/PLATELET
Abs Immature Granulocytes: 0.02 10*3/uL (ref 0.00–0.07)
Basophils Absolute: 0.1 10*3/uL (ref 0.0–0.1)
Basophils Relative: 1 %
Eosinophils Absolute: 0.3 10*3/uL (ref 0.0–0.5)
Eosinophils Relative: 5 %
HCT: 40.1 % (ref 36.0–46.0)
Hemoglobin: 12.8 g/dL (ref 12.0–15.0)
Immature Granulocytes: 0 %
Lymphocytes Relative: 16 %
Lymphs Abs: 1 10*3/uL (ref 0.7–4.0)
MCH: 29.4 pg (ref 26.0–34.0)
MCHC: 31.9 g/dL (ref 30.0–36.0)
MCV: 92 fL (ref 80.0–100.0)
Monocytes Absolute: 1 10*3/uL (ref 0.1–1.0)
Monocytes Relative: 15 %
Neutro Abs: 4 10*3/uL (ref 1.7–7.7)
Neutrophils Relative %: 63 %
Platelets: 166 10*3/uL (ref 150–400)
RBC: 4.36 MIL/uL (ref 3.87–5.11)
RDW: 13 % (ref 11.5–15.5)
WBC: 6.5 10*3/uL (ref 4.0–10.5)
nRBC: 0 % (ref 0.0–0.2)

## 2023-02-04 LAB — COMPREHENSIVE METABOLIC PANEL
ALT: 19 U/L (ref 0–44)
AST: 24 U/L (ref 15–41)
Albumin: 2.7 g/dL — ABNORMAL LOW (ref 3.5–5.0)
Alkaline Phosphatase: 82 U/L (ref 38–126)
Anion gap: 10 (ref 5–15)
BUN: 21 mg/dL (ref 8–23)
CO2: 26 mmol/L (ref 22–32)
Calcium: 8.5 mg/dL — ABNORMAL LOW (ref 8.9–10.3)
Chloride: 101 mmol/L (ref 98–111)
Creatinine, Ser: 0.92 mg/dL (ref 0.44–1.00)
GFR, Estimated: >60 mL/min (ref >60)
Glucose, Bld: 124 mg/dL — ABNORMAL HIGH (ref 70–99)
Potassium: 3.8 mmol/L (ref 3.5–5.1)
Sodium: 137 mmol/L (ref 135–145)
Total Bilirubin: 0.9 mg/dL (ref 0.3–1.2)
Total Protein: 6 g/dL — ABNORMAL LOW (ref 6.5–8.1)

## 2023-02-04 LAB — BRAIN NATRIURETIC PEPTIDE: B Natriuretic Peptide: 29.2 pg/mL (ref 0.0–100.0)

## 2023-02-04 LAB — LACTIC ACID, PLASMA: Lactic Acid, Venous: 1.2 mmol/L (ref 0.5–1.9)

## 2023-02-04 LAB — SARS CORONAVIRUS 2 BY RT PCR: SARS Coronavirus 2 by RT PCR: NEGATIVE

## 2023-02-04 LAB — TROPONIN I (HIGH SENSITIVITY): Troponin I (High Sensitivity): 5 ng/L (ref <18)

## 2023-02-04 MED ORDER — IPRATROPIUM-ALBUTEROL 0.5-2.5 (3) MG/3ML IN SOLN: 3.0000 mL | Freq: Two times a day (BID) | RESPIRATORY_TRACT | 0 refills | Status: DC

## 2023-02-04 MED ORDER — SODIUM CHLORIDE 0.9 % IV BOLUS
1000.0000 mL | Freq: Once | INTRAVENOUS | Status: AC
  Administered 2023-02-04: 1000 mL via INTRAVENOUS

## 2023-02-04 MED ORDER — SODIUM CHLORIDE 0.9 % IV SOLN
1.0000 g | Freq: Once | INTRAVENOUS | Status: AC
  Administered 2023-02-04: 1 g via INTRAVENOUS
  Filled 2023-02-04: qty 10

## 2023-02-04 MED ORDER — SODIUM CHLORIDE 0.9 % IV SOLN
500.0000 mg | Freq: Once | INTRAVENOUS | Status: AC
  Administered 2023-02-04: 500 mg via INTRAVENOUS
  Filled 2023-02-04: qty 5

## 2023-02-04 NOTE — ED Triage Notes (Signed)
BIB EMS/ from SNF/ cough/ possible aspiration PNA according to facility x-ray/ started on doxycycline today/ pt does not appear to be in any distress/ audible rhonchi/ wet cough/ hx of demential and aphasia

## 2023-02-04 NOTE — Progress Notes (Signed)
[facility] Room Number: 140-A Place of Service:  SNF [phone]) Provider:  Hazle Nordmann, NP  PCP: Mahlon Gammon, MD  Patient Care Team: Mahlon Gammon, MD as PCP - General (Internal Medicine) Jamison Neighbor, MD (Urology)  Extended Emergency Contact Information Primary Emergency Contact: Lambson,[name] Address: [address]          [place], [place] [address] [place] Home Phone: [phone] Work Phone: [phone] Relation: Son Secondary Emergency Contact: Mary Sella [place] of [place] Home Phone: [phone] Work Phone: [phone] Mobile Phone: [phone] Relation: Son  Code Status:  DNR Goals of care: Advanced Directive information    02/04/2023   10:04 AM  Advanced Directives  Does Patient Have a Medical Advance Directive? Yes  Type of Advance Directive Living will;Out of facility DNR (pink MOST or yellow form)  Does patient want to make changes to medical advance directive? No - Patient declined     Chief Complaint  Patient presents with   Acute Visit    Aspiration pneumonia.     HPI:  Pt is a 88 y.o. female seen today for an acute visit due to aspiration pneumonia.   Poor historian due to dementia. Episode of choking within the past week. 04/15 CXR reveals opacity of the left lower lung zone, acute perihilar inflammation, left lower lobe atelectasis. She was started on doxycycline x 7 days. She was also started on mucinex. O2 sats have also dropped < 90%. She is currently using 2 liters oxygen. Afebrile. Vitals stable.    Past Medical History:  Diagnosis Date   Alzheimer disease    Balance problem 07/19/2020   Constipation 07/19/2020   Dementia without behavioral disturbance 07/19/2020   MMSE 21/30 07/21/20   Fall    Osteoarthritis    Pyelonephritis    Spinal stenosis 07/19/2020   Weakness of left lower extremity 07/19/2020   History reviewed. No pertinent surgical  history.  Allergies  Allergen Reactions   Sulfa Antibiotics Rash    Rash to trunk, legs, neck, scalp, and arms    Allergies as of 02/04/2023       Reactions   Sulfa Antibiotics Rash   Rash to trunk, legs, neck, scalp, and arms        Medication List        Accurate as of February 04, 2023 10:04 AM. If you have any questions, ask your nurse or doctor.          acetaminophen 500 MG tablet Commonly known as: TYLENOL Take 500 mg by mouth 2 (two) times daily as needed.   acetaminophen 500 MG tablet Commonly known as: TYLENOL Take 2 tablets (1,000 mg total) by mouth in the morning.   CeraVe Lotn Apply topically daily. Apply Cerave lotion to back and extremities   docusate sodium 100 MG capsule Commonly known as: COLACE Take 100 mg by mouth 2 (two) times daily as needed for mild constipation.   doxycycline 100 MG capsule Commonly known as: VIBRAMYCIN Take 100 mg by mouth 2 (two) times daily.   estradiol 2 MG vaginal ring Commonly known as: ESTRING Place 2 mg vaginally every 3 (three) months. follow package directions   hydrALAZINE 10 MG tablet Commonly known as: APRESOLINE Take 10 mg by mouth every 8 (eight) hours as needed.   ibuprofen 200 MG tablet Commonly known as: ADVIL Take 400 mg by mouth 3 (three) times daily as needed for mild pain.  losartan 25 MG tablet Commonly known as: COZAAR Take 1 tablet (25 mg total) by mouth daily.   nitrofurantoin 50 MG capsule Commonly known as: MACRODANTIN Take 50 mg by mouth 2 (two) times daily.   polyethylene glycol 17 g packet Commonly known as: MIRALAX / GLYCOLAX Take 17 g by mouth every other day.   senna 8.6 MG Tabs tablet Commonly known as: SENOKOT Take 2 tablets by mouth at bedtime.   shark liver oil-cocoa butter 0.25-3-85.5 % suppository Commonly known as: PREPARATION H Place 1 suppository rectally as needed for hemorrhoids.   tamsulosin 0.4 MG Caps capsule Commonly known as: FLOMAX Take 0.4 mg by  mouth at bedtime.   THERACRAN PO Take 250 mg by mouth 2 (two) times daily.   Cranberry 250 MG Tabs Take 1 tablet by mouth every morning.   triamcinolone cream 0.1 % Commonly known as: KENALOG Apply 1 Application topically 2 (two) times daily as needed. To temple area x 4 weeks        Review of Systems  Unable to perform ROS: Dementia    Immunization History  Administered Date(s) Administered   Influenza, High Dose Seasonal PF 07/25/2021   Influenza-Unspecified 08/05/2018, 09/29/2018, 07/02/2019, 08/21/2019, 08/11/2020, 07/26/2022   Moderna Covid-19 Vaccine Bivalent Booster 51yrs & up 08/01/2021, 08/26/2022   Moderna SARS-COV2 Booster Vaccination 08/31/2020, 06/12/2021   Moderna Sars-Covid-2 Vaccination 11/04/2019, 12/02/2019   Pneumococcal Conjugate-13 09/28/2018   Pneumococcal Polysaccharide-23 10/21/2017   Tdap 02/23/2021   Zoster Recombinat (Shingrix) 05/24/2021, 07/24/2021   Pertinent  Health Maintenance Due  Topic Date Due   INFLUENZA VACCINE  05/22/2023   DEXA SCAN  Discontinued      04/25/2022   11:04 AM 06/03/2022    3:33 PM 10/01/2022    1:09 PM 11/05/2022   10:43 AM 12/05/2022    1:51 PM  Fall Risk  Falls in the past year?   0 0 0  Was there an injury with Fall?   0 0 0  Fall Risk Category Calculator   0 0 0  Fall Risk Category (Retired)   Low    (RETIRED) Patient Fall Risk Level High fall risk High fall risk High fall risk    Patient at Risk for Falls Due to   History of fall(s) History of fall(s) History of fall(s)  Fall risk Follow up   Falls evaluation completed Falls evaluation completed Falls evaluation completed   Functional Status Survey:    Vitals:   02/04/23 0951  BP: (!) 125/56  Pulse: 70  Resp: 18  Temp: 97.9 F (36.6 C)  SpO2: 92%  Weight: 154 lb 6.4 oz (70 kg)  Height: 5\' 2"  (1.575 m)   Body mass index is 28.24 kg/m. Physical Exam Vitals reviewed.  Constitutional:      General: She is not in acute distress. HENT:     Head:  Normocephalic.  Eyes:     General:        Right eye: No discharge.        Left eye: No discharge.  Cardiovascular:     Rate and Rhythm: Normal rate and regular rhythm.     Pulses: Normal pulses.     Heart sounds: Normal heart sounds.  Pulmonary:     Effort: Pulmonary effort is normal. No respiratory distress.     Breath sounds: Examination of the left-middle field reveals decreased breath sounds. Examination of the left-lower field reveals decreased breath sounds. Decreased breath sounds present. No wheezing.  Abdominal:  General: Bowel sounds are normal.     Palpations: Abdomen is soft.  Musculoskeletal:        General: Normal range of motion.     Cervical back: Neck supple.  Skin:    General: Skin is warm and dry.     Capillary Refill: Capillary refill takes less than 2 seconds.  Neurological:     General: No focal deficit present.     Mental Status: She is alert. Mental status is at baseline.     Motor: Weakness present.     Gait: Gait abnormal.  Psychiatric:        Mood and Affect: Mood normal.     Comments: Very pleasant, follows some commands, alert to self and familiar face     Labs reviewed: Recent Labs    04/25/22 1154 11/07/22 0000 02/03/23 0000  NA 139 137 138  K 4.4 4.3 4.3  CL 104 104 104  CO2 26 24* 26*  GLUCOSE 113*  --   --   BUN CREATININE 0.89 0.7 0.8  CALCIUM 9.2 9.0 9.2   Recent Labs    02/05/22 0000 11/07/22 0000 02/03/23 0000  AST 15  --   --   ALT 13  --   --   ALKPHOS 80  --  96  ALBUMIN 3.7 3.4* 3.9   Recent Labs    02/05/22 0000 04/19/22 0000 04/25/22 1154 11/07/22 0000 02/03/23 0000  WBC 6.5 5.9 6.9 8.9 6.7  NEUTROABS 3.50  --  4.4  --   --   HGB 13.2 13.6 13.7 12.5 15.4  HCT 40 40 42.1 38 47*  MCV  --   --  89.6  --   --   PLT 197 198 178 180  --    Lab Results  Component Value Date   TSH 0.942 04/25/2022   No results found for: "HGBA1C" Lab Results  Component Value Date   CHOL 235 (A) 07/20/2020    HDL 100 (A) 07/20/2020   LDLCALC 134 07/20/2020   TRIG 102 07/20/2020    Significant Diagnostic Results in last 30 days:  No results found.  Assessment/Plan 1. Aspiration pneumonia of left lower lobe, unspecified aspiration pneumonia type - 04/15 CXR reveals opacity of the left lower lung zone, acute perihilar inflammation, left lower lobe atelectasis - LLL diminished, sats > 90% on 2 liters  - cont doxycycline x 7 days - cont mucinex - start duonebs BID x 3 days, then BID prn - ST evaluation- start next week  Family/ staff Communication: plan discussed with patient and nurse  Labs/tests ordered:  none

## 2023-02-04 NOTE — ED Provider Notes (Signed)
[place] EM[facility]GENC[facility] DEPARTMENT AT [facility] Provider Note   CSN: [id number] Arrival date & time: 02/04/23  2058     History {Add pertinent medical, surgical, social history, OB history to HPI:1} Chief Complaint  Patient presents with   Cough    Cheryl Monroe is a 88 y.o. female.  Level 5 caveat secondary to dementia.  At baseline she has no respiratory issues, does not ambulate due to some lower extremity weakness.  She does interact with family.  Son is giving most of the history.  He said she was mostly fine 2 days ago when he took her out.  She had a little bit of nasal congestion.  Yesterday she started with cough and chest congestion.  There was some concern she could have aspirated and so she had a chest x-ray that showed possible pneumonia and was started on doxycycline.  He said today she is less interactive and has not been eating or drinking much.  The history is provided by a relative.  Cough Cough characteristics:  Productive Sputum characteristics:  Nondescript Onset quality:  Gradual Duration:  2 days Timing:  Intermittent Progression:  Unchanged Chronicity:  New Smoker: no   Relieved by:  Nothing Worsened by:  Nothing Ineffective treatments:  Home nebulizer      Home Medications Prior to Admission medications   Medication Sig Start Date End Date Taking? Authorizing Provider  acetaminophen (TYLENOL) 500 MG tablet Take 2 tablets (1,000 mg total) by mouth in the morning. 12/25/21   Fargo, Amy E, NP  acetaminophen (TYLENOL) 500 MG tablet Take 500 mg by mouth 2 (two) times daily as needed.    [provider]  Cranberry (THERACRAN PO) Take 250 mg by mouth 2 (two) times daily.    [provider]  Cranberry 250 MG TABS Take 1 tablet by mouth every morning.    [provider]  docusate sodium (COLACE) 100 MG capsule Take 100 mg by mouth 2 (two) times daily as needed for mild constipation.    [provider]  doxycycline  (VIBRAMYCIN) 100 MG capsule Take 100 mg by mouth 2 (two) times daily.  02/10/23  [provider]  Emollient (CERAVE) LOTN Apply topically daily. Apply Cerave lotion to back and extremities    [provider]  estradiol (ESTRING) 2 MG vaginal ring Place 2 mg vaginally every 3 (three) months. follow package directions 07/19/20   Renato Gails, Tiffany L, DO  hydrALAZINE (APRESOLINE) 10 MG tablet Take 10 mg by mouth every 8 (eight) hours as needed.    [provider]  ibuprofen (ADVIL) 200 MG tablet Take 400 mg by mouth 3 (three) times daily as needed for mild pain.    [provider]  ipratropium-albuterol (DUONEB) 0.5-2.5 (3) MG/3ML SOLN Take 3 mLs by nebulization in the morning and at bedtime for 3 days. 02/04/23 02/07/23  Fargo, Amy E, NP  losartan (COZAAR) 25 MG tablet Take 1 tablet (25 mg total) by mouth daily. 05/14/22   Nahser, Deloris Ping, MD  nitrofurantoin (MACRODANTIN) 50 MG capsule Take 50 mg by mouth 2 (two) times daily.    [provider]  polyethylene glycol (MIRALAX / GLYCOLAX) 17 g packet Take 17 g by mouth every other day.    [provider]  senna (SENOKOT) 8.6 MG TABS tablet Take 2 tablets by mouth at bedtime.    [provider]  shark liver oil-cocoa butter (PREPARATION H) 0.25-3-85.5 % suppository Place 1 suppository rectally as needed for hemorrhoids.  [provider]  tamsulosin (FLOMAX) 0.4 MG CAPS capsule Take 0.4 mg by mouth at bedtime.    [provider]  triamcinolone cream (KENALOG) 0.1 % Apply 1 Application topically 2 (two) times daily as needed. To temple area x 4 weeks    [provider]      Allergies    Sulfa antibiotics    Review of Systems   Review of Systems  Unable to perform ROS: Dementia  Respiratory:  Positive for cough.     Physical Exam Updated Vital Signs BP (!) 117/53 (BP Location: Right Arm)   Pulse 71   Temp 98 F (36.7 C) (Oral)   Resp 17   Wt 72 kg   SpO2 93%    BMI 29.03 kg/m  Physical Exam Vitals and nursing note reviewed.  Constitutional:      General: She is not in acute distress.    Appearance: Normal appearance. She is well-developed.  HENT:     Head: Normocephalic and atraumatic.  Eyes:     Comments: She has some watery discharge in her eyes.  No conjunctival injection  Cardiovascular:     Rate and Rhythm: Normal rate and regular rhythm.     Heart sounds: No murmur heard. Pulmonary:     Effort: Pulmonary effort is normal. No respiratory distress.     Breath sounds: Rhonchi present.  Abdominal:     Palpations: Abdomen is soft.     Tenderness: There is no abdominal tenderness. There is no guarding or rebound.  Musculoskeletal:        General: No deformity. Normal range of motion.     Cervical back: Neck supple.     Right lower leg: No edema.     Left lower leg: No edema.  Skin:    General: Skin is warm and dry.     Capillary Refill: Capillary refill takes less than 2 seconds.  Neurological:     Mental Status: Mental status is at baseline.     Comments: She is awake and slightly interactive although quiet.  No slurred speech or obvious facial droop.  She is moving all extremities without any difficulty.     ED Results / Procedures / Treatments   Labs (all labs ordered are listed, but only abnormal results are displayed) Labs Reviewed  CULTURE, BLOOD (ROUTINE X 2)  CULTURE, BLOOD (ROUTINE X 2)  SARS CORONAVIRUS 2 BY RT PCR  COMPREHENSIVE METABOLIC PANEL  CBC WITH DIFFERENTIAL/PLATELET  BRAIN NATRIURETIC PEPTIDE  LACTIC ACID, PLASMA  LACTIC ACID, PLASMA  URINALYSIS, ROUTINE W REFLEX MICROSCOPIC  TROPONIN I (HIGH SENSITIVITY)    EKG None  Radiology No results found.  Procedures Procedures  {Document cardiac monitor, telemetry assessment procedure when appropriate:1}  Medications Ordered in ED Medications  sodium chloride 0.9 % bolus 1,000 mL (has no administration in time range)    ED Course/ Medical Decision  Making/ A&P   {   Click here for ABCD2, HEART and other calculatorsREFRESH Note before signing :1}                          Medical Decision Making Amount and/or Complexity of Data Reviewed Labs: ordered. Radiology: ordered.   This patient complains of ***; this involves an extensive number of treatment Options and is a complaint that carries with it a high risk of complications and morbidity. The differential includes ***  I ordered, reviewed and interpreted labs, which included *** I ordered  medication *** and reviewed PMP when indicated. I ordered imaging studies which included *** and I independently    visualized and interpreted imaging which showed *** Additional history obtained from *** Previous records obtained and reviewed *** I consulted *** and discussed lab and imaging findings and discussed disposition.  Cardiac monitoring reviewed, *** Social determinants considered, *** Critical Interventions: ***  After the interventions stated above, I reevaluated the patient and found *** Admission and further testing considered, ***   {Document critical care time when appropriate:1} {Document review of labs and clinical decision tools ie heart score, Chads2Vasc2 etc:1}  {Document your independent review of radiology images, and any outside records:1} {Document your discussion with family members, caretakers, and with consultants:1} {Document social determinants of health affecting pt's care:1} {Document your decision making why or why not admission, treatments were needed:1} Final Clinical Impression(s) / ED Diagnoses Final diagnoses:  None    Rx / DC Orders ED Discharge Orders     None

## 2023-02-04 NOTE — Telephone Encounter (Signed)
Cheryl Monroe's son called to request sending her mother to ED for hospital admission, chest CT, and IV stronger antibiotic such as Imipenem. I spoke to the nurse @ [facility] and ED [facility] hospital regarding his request.

## 2023-02-05 DIAGNOSIS — Z66 Do not resuscitate: Secondary | ICD-10-CM | POA: Diagnosis present

## 2023-02-05 DIAGNOSIS — E876 Hypokalemia: Secondary | ICD-10-CM | POA: Diagnosis present

## 2023-02-05 DIAGNOSIS — N390 Urinary tract infection, site not specified: Secondary | ICD-10-CM

## 2023-02-05 DIAGNOSIS — Z993 Dependence on wheelchair: Secondary | ICD-10-CM | POA: Diagnosis not present

## 2023-02-05 DIAGNOSIS — I10 Essential (primary) hypertension: Secondary | ICD-10-CM | POA: Diagnosis present

## 2023-02-05 DIAGNOSIS — Z79899 Other long term (current) drug therapy: Secondary | ICD-10-CM | POA: Diagnosis not present

## 2023-02-05 DIAGNOSIS — F0390 Unspecified dementia without behavioral disturbance: Secondary | ICD-10-CM

## 2023-02-05 DIAGNOSIS — F0280 Dementia in other diseases classified elsewhere without behavioral disturbance: Secondary | ICD-10-CM | POA: Diagnosis present

## 2023-02-05 DIAGNOSIS — R4701 Aphasia: Secondary | ICD-10-CM

## 2023-02-05 DIAGNOSIS — B9710 Unspecified enterovirus as the cause of diseases classified elsewhere: Secondary | ICD-10-CM | POA: Diagnosis present

## 2023-02-05 DIAGNOSIS — Z882 Allergy status to sulfonamides status: Secondary | ICD-10-CM | POA: Diagnosis not present

## 2023-02-05 DIAGNOSIS — J690 Pneumonitis due to inhalation of food and vomit: Secondary | ICD-10-CM | POA: Diagnosis not present

## 2023-02-05 DIAGNOSIS — F02C0 Dementia in other diseases classified elsewhere, severe, without behavioral disturbance, psychotic disturbance, mood disturbance, and anxiety: Secondary | ICD-10-CM | POA: Diagnosis not present

## 2023-02-05 DIAGNOSIS — Z1152 Encounter for screening for COVID-19: Secondary | ICD-10-CM | POA: Diagnosis not present

## 2023-02-05 DIAGNOSIS — G309 Alzheimer's disease, unspecified: Secondary | ICD-10-CM | POA: Diagnosis present

## 2023-02-05 DIAGNOSIS — Z87440 Personal history of urinary (tract) infections: Secondary | ICD-10-CM | POA: Diagnosis not present

## 2023-02-05 DIAGNOSIS — I4510 Unspecified right bundle-branch block: Secondary | ICD-10-CM | POA: Diagnosis present

## 2023-02-05 DIAGNOSIS — Z7409 Other reduced mobility: Secondary | ICD-10-CM | POA: Diagnosis present

## 2023-02-05 DIAGNOSIS — J189 Pneumonia, unspecified organism: Secondary | ICD-10-CM | POA: Diagnosis present

## 2023-02-05 DIAGNOSIS — R5381 Other malaise: Secondary | ICD-10-CM | POA: Diagnosis present

## 2023-02-05 DIAGNOSIS — Z713 Dietary counseling and surveillance: Secondary | ICD-10-CM | POA: Diagnosis not present

## 2023-02-05 DIAGNOSIS — G301 Alzheimer's disease with late onset: Secondary | ICD-10-CM | POA: Diagnosis not present

## 2023-02-05 DIAGNOSIS — R531 Weakness: Secondary | ICD-10-CM

## 2023-02-05 DIAGNOSIS — Z6829 Body mass index (BMI) 29.0-29.9, adult: Secondary | ICD-10-CM | POA: Diagnosis not present

## 2023-02-05 DIAGNOSIS — E663 Overweight: Secondary | ICD-10-CM | POA: Diagnosis present

## 2023-02-05 DIAGNOSIS — B348 Other viral infections of unspecified site: Secondary | ICD-10-CM | POA: Diagnosis not present

## 2023-02-05 DIAGNOSIS — B9789 Other viral agents as the cause of diseases classified elsewhere: Secondary | ICD-10-CM | POA: Diagnosis present

## 2023-02-05 DIAGNOSIS — J9811 Atelectasis: Secondary | ICD-10-CM | POA: Diagnosis present

## 2023-02-05 DIAGNOSIS — E669 Obesity, unspecified: Secondary | ICD-10-CM | POA: Diagnosis present

## 2023-02-05 DIAGNOSIS — J069 Acute upper respiratory infection, unspecified: Secondary | ICD-10-CM | POA: Diagnosis present

## 2023-02-05 LAB — BASIC METABOLIC PANEL
Anion gap: 9 (ref 5–15)
BUN: 13 mg/dL (ref 8–23)
CO2: 24 mmol/L (ref 22–32)
Calcium: 8.4 mg/dL — ABNORMAL LOW (ref 8.9–10.3)
Chloride: 106 mmol/L (ref 98–111)
Creatinine, Ser: 0.61 mg/dL (ref 0.44–1.00)
GFR, Estimated: >60 mL/min (ref >60)
Glucose, Bld: 94 mg/dL (ref 70–99)
Potassium: 3.7 mmol/L (ref 3.5–5.1)
Sodium: 139 mmol/L (ref 135–145)

## 2023-02-05 LAB — URINALYSIS, ROUTINE W REFLEX MICROSCOPIC
Bilirubin Urine: NEGATIVE
Glucose, UA: NEGATIVE mg/dL
Ketones, ur: NEGATIVE mg/dL
Nitrite: NEGATIVE
Protein, ur: NEGATIVE mg/dL
Specific Gravity, Urine: 1.044 — ABNORMAL HIGH (ref 1.005–1.030)
WBC, UA: >50 WBC/hpf (ref 0–5)
pH: 6 (ref 5.0–8.0)

## 2023-02-05 LAB — C-REACTIVE PROTEIN: CRP: 5.6 mg/dL — ABNORMAL HIGH (ref <1.0)

## 2023-02-05 LAB — PROCALCITONIN: Procalcitonin: <0.1 ng/mL

## 2023-02-05 LAB — TROPONIN I (HIGH SENSITIVITY): Troponin I (High Sensitivity): 5 ng/L (ref <18)

## 2023-02-05 MED ORDER — SODIUM CHLORIDE 0.9 % IV SOLN
500.0000 mg | INTRAVENOUS | Status: DC
  Administered 2023-02-05: 500 mg via INTRAVENOUS
  Filled 2023-02-05: qty 5

## 2023-02-05 MED ORDER — ACETAMINOPHEN 325 MG PO TABS: 650.0000 mg | ORAL_TABLET | Freq: Four times a day (QID) | ORAL | Status: DC | PRN

## 2023-02-05 MED ORDER — SODIUM CHLORIDE 0.9% FLUSH
3.0000 mL | Freq: Two times a day (BID) | INTRAVENOUS | Status: DC
  Administered 2023-02-05 – 2023-02-11 (×12): 3 mL via INTRAVENOUS

## 2023-02-05 MED ORDER — ALBUTEROL SULFATE (2.5 MG/3ML) 0.083% IN NEBU: 2.5000 mg | INHALATION_SOLUTION | Freq: Four times a day (QID) | RESPIRATORY_TRACT | Status: DC | PRN

## 2023-02-05 MED ORDER — ENOXAPARIN SODIUM 40 MG/0.4ML IJ SOSY
40.0000 mg | PREFILLED_SYRINGE | Freq: Every day | INTRAMUSCULAR | Status: DC
  Administered 2023-02-05 – 2023-02-11 (×7): 40 mg via SUBCUTANEOUS
  Filled 2023-02-05 (×7): qty 0.4

## 2023-02-05 MED ORDER — IOHEXOL 350 MG/ML SOLN
75.0000 mL | Freq: Once | INTRAVENOUS | Status: AC | PRN
  Administered 2023-02-05: 75 mL via INTRAVENOUS

## 2023-02-05 MED ORDER — SODIUM CHLORIDE 0.9 % IV SOLN
2.0000 g | INTRAVENOUS | Status: AC
  Administered 2023-02-06 – 2023-02-09 (×5): 2 g via INTRAVENOUS
  Filled 2023-02-05 (×5): qty 20

## 2023-02-05 MED ORDER — ACETAMINOPHEN 650 MG RE SUPP: 650.0000 mg | Freq: Four times a day (QID) | RECTAL | Status: DC | PRN

## 2023-02-05 MED ORDER — ALBUTEROL SULFATE (2.5 MG/3ML) 0.083% IN NEBU
2.5000 mg | INHALATION_SOLUTION | Freq: Two times a day (BID) | RESPIRATORY_TRACT | Status: DC
  Administered 2023-02-05 – 2023-02-10 (×10): 2.5 mg via RESPIRATORY_TRACT
  Filled 2023-02-05 (×11): qty 3

## 2023-02-05 MED ORDER — POLYETHYLENE GLYCOL 3350 17 G PO PACK
17.0000 g | PACK | ORAL | Status: DC
  Administered 2023-02-05 – 2023-02-11 (×3): 17 g via ORAL
  Filled 2023-02-05 (×5): qty 1

## 2023-02-05 NOTE — ED Notes (Signed)
Please up date family [phone]

## 2023-02-05 NOTE — ED Provider Notes (Signed)
7:57 AM Care assumed from Dr. Blinda Leatherwood from Dr. Charm Barges.  Patient reportedly waiting on CT scan to rule out pulm embolism to go along with suspected pneumonia in the setting of altered mental status changes and feeling outpatient antibiotics.  According to documentation, patient diagnosed with pneumonia and has been on doxycycline but has had eating and drinking less and had some mental status change with increased breathing.  Patient placed on oxygen here.  Patient had x-ray that showed possible atelectasis but a CT scan was performed and fortunately did not show evidence of pulm embolism but did show evidence of suspected pneumonia.  Given the patient's age, mental status change, failing outpatient antibiotics, and new pneumonia, patient be admitted.  Patient is already received Rocephin and azithromycin during her ED stay and will call for medical admission for this pneumonia.   Donja Tipping, Canary Brim, MD 02/05/23 1020

## 2023-02-05 NOTE — ED Notes (Signed)
ED TO INPATIENT HANDOFF REPORT  ED Nurse Name and Phone #: Rebeca Allegra RN  S Name/Age/Gender Nadene Rubins 88 y.o. female Room/Bed: 009C/009C  Code Status   Code Status: Prior  Home/SNF/Other Skilled nursing facility Patient oriented to: self Is this baseline? Yes   Triage Complete: Triage complete  Chief Complaint CAP (community acquired pneumonia) due to Mycoplasma pneumoniae [J15.7]  Triage Note BIB EMS/ from SNF/ cough/ possible aspiration PNA according to facility x-ray/ started on doxycycline today/ pt does not appear to be in any distress/ audible rhonchi/ wet cough/ hx of demential and aphasia    Allergies Allergies  Allergen Reactions   Sulfa Antibiotics Rash    Rash to trunk, legs, neck, scalp, and arms    Level of Care/Admitting Diagnosis ED Disposition     ED Disposition  Admit   Condition  --   Comment  Hospital Area: [facility] [[id number]]  Level of Care: Telemetry Medical [104]  May admit patient to [facility] or [facility] if equivalent level of care is available:: No  Covid Evaluation: Asymptomatic - no recent exposure (last 10 days) testing not required  Diagnosis: CAP (community acquired pneumonia) due to Mycoplasma pneumoniae [[id number]]  Admitting Physician: Clydie Braun [[id number]]  Attending Physician: Clydie Braun [[id number]]  Certification:: I certify this patient will need inpatient services for at least 2 midnights  Estimated Length of Stay: 2          B Medical/Surgery History Past Medical History:  Diagnosis Date   Alzheimer disease    Balance problem 07/19/2020   Constipation 07/19/2020   Dementia without behavioral disturbance 07/19/2020   MMSE 21/30 07/21/20   Fall    Osteoarthritis    Pyelonephritis    Spinal stenosis 07/19/2020   Weakness of left lower extremity 07/19/2020   History reviewed. No pertinent surgical history.   A IV Location/Drains/Wounds Patient Lines/Drains/Airways Status      Active Line/Drains/Airways     Name Placement date Placement time Site Days   Peripheral IV 02/04/23 22 G Right Antecubital 02/04/23  2204  Antecubital  1   Peripheral IV 02/04/23 20 G Left Wrist 02/04/23  2045  Wrist  1   Peripheral IV 02/04/23 20 G 1.88" Anterior;Left;Proximal Forearm 02/04/23  2311  Forearm  1            Intake/Output Last 24 hours  Intake/Output Summary (Last 24 hours) at 02/05/2023 0840 Last data filed at 02/04/2023 2059 Gross per 24 hour  Intake 400 ml  Output --  Net 400 ml    Labs/Imaging Results for orders placed or performed during the hospital encounter of 02/04/23 (from the past 48 hour(s))  SARS Coronavirus 2 by RT PCR (hospital order, performed in [facility] hospital lab) *cepheid single result test* Anterior Nasal Swab     Status: None   Collection Time: 02/04/23  9:29 PM   Specimen: Anterior Nasal Swab  Result Value Ref Range   SARS Coronavirus 2 by RT PCR NEGATIVE NEGATIVE    Comment: Performed at [facility], [address] [place], [place] [address]  Culture, blood (routine x 2)     Status: None (Preliminary result)   Collection Time: 02/04/23  9:50 PM   Specimen: BLOOD  Result Value Ref Range   Specimen Description BLOOD SITE NOT SPECIFIED    Special Requests      BOTTLES DRAWN AEROBIC AND ANAEROBIC Blood Culture results may not be optimal due to an inadequate  volume of blood received in culture bottles   Culture      NO GROWTH < 12 HOURS Performed at [facility], [address], [place], [place] [address]    Report Status PENDING   Comprehensive metabolic panel     Status: Abnormal   Collection Time: 02/04/23 10:05 PM  Result Value Ref Range   Sodium 137 135 - 145 mmol/L   Potassium 3.8 3.5 - 5.1 mmol/L   Chloride 101 98 - 111 mmol/L   CO2 26 22 - 32 mmol/L   Glucose, Bld 124 (H) 70 - 99 mg/dL    Comment: Glucose reference range applies only to samples taken after fasting for at least 8 hours.   BUN 21 8 - 23  mg/dL   Creatinine, Ser 5.78 0.44 - 1.00 mg/dL   Calcium 8.5 (L) 8.9 - 10.3 mg/dL   Total Protein 6.0 (L) 6.5 - 8.1 g/dL   Albumin 2.7 (L) 3.5 - 5.0 g/dL   AST 24 15 - 41 U/L   ALT 19 0 - 44 U/L   Alkaline Phosphatase 82 38 - 126 U/L   Total Bilirubin 0.9 0.3 - 1.2 mg/dL   GFR, Estimated >46 >96 mL/min    Comment: (NOTE) Calculated using the CKD-EPI Creatinine Equation (2021)    Anion gap 10 5 - 15    Comment: Performed at [facility], [address] [place], [place] [address]  Troponin I (High Sensitivity)     Status: None   Collection Time: 02/04/23 10:05 PM  Result Value Ref Range   Troponin I (High Sensitivity) 5 <18 ng/L    Comment: (NOTE) Elevated high sensitivity troponin I (hsTnI) values and significant  changes across serial measurements may suggest ACS but many other  chronic and acute conditions are known to elevate hsTnI results.  Refer to the "Links" section for chest pain algorithms and additional  guidance. Performed at [facility], [address] [place], [place] [address]   CBC with Differential     Status: None   Collection Time: 02/04/23 10:05 PM  Result Value Ref Range   WBC 6.5 4.0 - 10.5 K/uL   RBC 4.36 3.87 - 5.11 MIL/uL   Hemoglobin 12.8 12.0 - 15.0 g/dL   HCT 40.1 02.7 - 25.3 %   MCV 92.0 80.0 - 100.0 fL   MCH 29.4 26.0 - 34.0 pg   MCHC 31.9 30.0 - 36.0 g/dL   RDW 66.4 40.3 - 47.4 %   Platelets 166 150 - 400 K/uL   nRBC 0.0 0.0 - 0.2 %   Neutrophils Relative % 63 %   Neutro Abs 4.0 1.7 - 7.7 K/uL   Lymphocytes Relative 16 %   Lymphs Abs 1.0 0.7 - 4.0 K/uL   Monocytes Relative 15 %   Monocytes Absolute 1.0 0.1 - 1.0 K/uL   Eosinophils Relative 5 %   Eosinophils Absolute 0.3 0.0 - 0.5 K/uL   Basophils Relative 1 %   Basophils Absolute 0.1 0.0 - 0.1 K/uL   Immature Granulocytes 0 %   Abs Immature Granulocytes 0.02 0.00 - 0.07 K/uL    Comment: Performed at [facility], [address] [place], [place] [address]  Brain  natriuretic peptide     Status: None   Collection Time: 02/04/23 10:05 PM  Result Value Ref Range   B Natriuretic Peptide 29.2 0.0 - 100.0 pg/mL    Comment: Performed at [facility], [address] [place], [place] [address]  Lactic acid, plasma     Status: None   Collection Time: 02/04/23 10:05 PM  Result Value Ref Range   Lactic Acid, Venous 1.2 0.5 - 1.9 mmol/L    Comment: Performed at [facility], [address], [place], [place] [address]  Culture, blood (routine x 2)     Status: None (Preliminary result)   Collection Time: 02/04/23 10:05 PM   Specimen: BLOOD  Result Value Ref Range   Specimen Description BLOOD SITE NOT SPECIFIED    Special Requests      BOTTLES DRAWN AEROBIC AND ANAEROBIC Blood Culture adequate volume   Culture      NO GROWTH < 12 HOURS Performed at [facility], [address], [place], [place] [address]    Report Status PENDING   Troponin I (High Sensitivity)     Status: None   Collection Time: 02/05/23 12:18 AM  Result Value Ref Range   Troponin I (High Sensitivity) 5 <18 ng/L    Comment: (NOTE) Elevated high sensitivity troponin I (hsTnI) values and significant  changes across serial measurements may suggest ACS but many other  chronic and acute conditions are known to elevate hsTnI results.  Refer to the "Links" section for chest pain algorithms and additional  guidance. Performed at [facility], [address], [place], [place] [address]   Urinalysis, Routine w reflex microscopic -Urine, Clean Catch     Status: Abnormal   Collection Time: 02/05/23  1:35 AM  Result Value Ref Range   Color, Urine YELLOW YELLOW   APPearance CLOUDY (A) CLEAR   Specific Gravity, Urine 1.044 (H) 1.005 - 1.030   pH 6.0 5.0 - 8.0   Glucose, UA NEGATIVE NEGATIVE mg/dL   Hgb urine dipstick SMALL (A) NEGATIVE   Bilirubin Urine NEGATIVE NEGATIVE   Ketones, ur NEGATIVE NEGATIVE mg/dL   Protein, ur NEGATIVE NEGATIVE mg/dL   Nitrite NEGATIVE  NEGATIVE   Leukocytes,Ua LARGE (A) NEGATIVE   RBC / HPF 6-10 0 - 5 RBC/hpf   WBC, UA >50 0 - 5 WBC/hpf   Bacteria, UA FEW (A) NONE SEEN   Squamous Epithelial / HPF 0-5 0 - 5 /HPF   WBC Clumps PRESENT     Comment: Performed at [facility], [address], [place], [place] [address]   CT Angio Chest PE W/Cm &/Or Wo Cm  Result Date: 02/05/2023 CLINICAL DATA:  Cough, possible aspiration pneumonia EXAM: CT ANGIOGRAPHY CHEST WITH CONTRAST TECHNIQUE: Multidetector CT imaging of the chest was performed using the standard protocol during bolus administration of intravenous contrast. Multiplanar CT image reconstructions and MIPs were obtained to evaluate the vascular anatomy. RADIATION DOSE REDUCTION: This exam was performed according to the departmental dose-optimization program which includes automated exposure control, adjustment of the mA and/or kV according to patient size and/or use of iterative reconstruction technique. CONTRAST:  75mL OMNIPAQUE IOHEXOL 350 MG/ML SOLN COMPARISON:  Chest radiograph dated 02/04/2023 FINDINGS: Cardiovascular: Satisfactory opacification of the bilateral pulmonary arteries to the lobar level. No evidence of pulmonary embolism. Study is not tailored for evaluation of the thoracic aorta. Ascending thoracic aorta measures 4.0 cm. Atherosclerotic calcifications of the aortic arch. The heart is normal in size.  No pericardial effusion. Moderate three-vessel coronary atherosclerosis. Mediastinum/Nodes: No suspicious mediastinal lymphadenopathy. Left thyroid goiter, without discrete nodule. Lungs/Pleura: Left lower lobe atelectasis/collapse with air bronchograms. Mild right lower lobe opacity, likely atelectasis. No suspicious pulmonary nodules. No pleural effusion or  pneumothorax. Upper Abdomen: Visualized upper abdomen is grossly unremarkable, noting vascular calcifications. Musculoskeletal: Visualized osseous structures are within normal limits. Review of the MIP images  confirms the above findings. IMPRESSION: No evidence of pulmonary embolism. Left lower lobe atelectasis/collapse with air bronchograms. Differential considerations include atelectasis (favored) versus pneumonia. Ascending thoracic aorta measures 4.0 cm. Recommend annual imaging followup by CTA or MRA. This recommendation follows 2010 ACCF/AHA/AATS/ACR/ASA/SCA/SCAI/SIR/STS/SVM Guidelines for the Diagnosis and Management of Patients with Thoracic Aortic Disease. Circulation. 2010; 121: Z610-R604. Aortic aneurysm NOS (ICD10-I71.9) Aortic Atherosclerosis (ICD10-I70.0). Electronically Signed   By: Charline Bills M.D.   On: 02/05/2023 00:12   DG Chest Port 1 View  Result Date: 02/04/2023 CLINICAL DATA:  Cough EXAM: PORTABLE CHEST 1 VIEW COMPARISON:  Chest x-ray dated April 15, 2022 FINDINGS: Rightward patient rotation limits evaluation. Within limitations, cardiac and mediastinal contours are unchanged. New mild bibasilar opacities which are likely due to atelectasis. No evidence of pleural effusion or pneumothorax. IMPRESSION: New mild bibasilar opacities which are likely due to atelectasis. Electronically Signed   By: Allegra Lai M.D.   On: 02/04/2023 21:51    Pending Labs Unresulted Labs (From admission, onward)    None       Vitals/Pain Today's Vitals   02/05/23 0600 02/05/23 0615 02/05/23 0630 02/05/23 0645  BP: (!) 161/73 (!) 153/86 (!) 165/65 (!) 160/60  Pulse: 61 62 62 65  Resp: (!) 22 (!) 25 (!) 25 (!) 23  Temp:      TempSrc:      SpO2: 100% 100% 100% 100%  Weight:      PainSc:        Isolation Precautions Airborne and Contact precautions  Medications Medications  sodium chloride 0.9 % bolus 1,000 mL (0 mLs Intravenous Stopped 02/05/23 0021)  cefTRIAXone (ROCEPHIN) 1 g in sodium chloride 0.9 % 100 mL IVPB (0 g Intravenous Stopped 02/04/23 2332)  azithromycin (ZITHROMAX) 500 mg in sodium chloride 0.9 % 250 mL IVPB (0 mg Intravenous Stopped 02/05/23 0021)  iohexol (OMNIPAQUE)  350 MG/ML injection 75 mL (75 mLs Intravenous Contrast Given 02/05/23 0004)    Mobility Have not been able to assess     Focused Assessments Pulmonary Assessment Handoff:  Lung sounds: Bilateral Breath Sounds: Rhonchi O2 Device: Room Air      R Recommendations: See Admitting Provider Note  Report given to:   Additional Notes:

## 2023-02-05 NOTE — Evaluation (Signed)
Clinical/Bedside Swallow Evaluation Patient Details  Name: Cheryl Monroe MRN: [id number] Date of Birth: Sep 17, 1935  Today's Date: 02/05/2023 Time: SLP Start Time (ACUTE ONLY): 1527 SLP Stop Time (ACUTE ONLY): 1542 SLP Time Calculation (min) (ACUTE ONLY): 15 min  Past Medical History:  Past Medical History:  Diagnosis Date   Alzheimer disease    Balance problem 07/19/2020   Constipation 07/19/2020   Dementia without behavioral disturbance 07/19/2020   MMSE 21/30 07/21/20   Fall    Osteoarthritis    Pyelonephritis    Spinal stenosis 07/19/2020   Weakness of left lower extremity 07/19/2020   Past Surgical History: History reviewed. No pertinent surgical history. HPI:  Pt is an 88 y.o. female who presented with cough. CT chest: Left lower lobe atelectasis/collapse with air bronchograms. Differential considerations include atelectasis (favored) versus pneumonia. Pt dx with pneumonia. 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    Assessment / Plan / Recommendation  Clinical Impression  Pt was seen for bedside swallow evaluation. She was unable to provide history regarding her swallowing, but her daughter and son were contacted via phone and both denied the pt having a history of oropharyngeal dysphagia symptoms. Oral mechanism exam was [facility] P [facility] and she presented with adequate, natural dentition. She tolerated all solids and liquids without signs or symptoms of oropharyngeal dysphagia. A regular texture diet with thin liquids is recommended at this time. Pt's performance was discussed with pt's children and, considering pt's risk factors, it was agreed that a modified barium swallow study will be performed to rule out silent aspiration. This will be planned for 4/18 pending radiology's schedule.  SLP Visit Diagnosis: Dysphagia, unspecified (R13.10)    Aspiration Risk  Mild aspiration risk    Diet  Recommendation Regular;Thin liquid   Liquid Administration via: Cup;Straw Medication Administration: Whole meds with puree Supervision: Staff to assist with self feeding Compensations: Slow rate;Small sips/bites Postural Changes: Seated upright at 90 degrees    Other  Recommendations Oral Care Recommendations: Oral care BID    Recommendations for follow up therapy are one component of a multi-disciplinary discharge planning process, led by the attending physician.  Recommendations may be updated based on patient status, additional functional criteria and insurance authorization.  Follow up Recommendations  (TBD)      Assistance Recommended at Discharge    Functional Status Assessment Patient has not had a recent decline in their functional status  Frequency and Duration            Prognosis        Swallow Study   General Date of Onset: 02/04/23 HPI: Pt is an 88 y.o. female who presented with cough. CT chest: Left lower lobe atelectasis/collapse with air bronchograms. Differential considerations include atelectasis (favored) versus pneumonia. Pt dx with pneumonia. 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 Type of Study: Bedside Swallow Evaluation Previous Swallow Assessment: none Diet Prior to this Study: Regular;Thin liquids (Level 0) Temperature Spikes Noted: No Respiratory Status: Room air History of Recent Intubation: No Behavior/Cognition: Alert;Cooperative;Pleasant mood;Doesn't follow directions;Requires cueing Oral Cavity Assessment: Within Functional Limits Oral Care Completed by SLP: No Oral Cavity - Dentition: Adequate natural dentition Vision: Functional for self-feeding Self-Feeding Abilities: Needs assist Patient Positioning: Upright in bed;Postural control adequate for testing Baseline Vocal Quality: Normal;Low vocal intensity Volitional Cough: Cognitively unable to  elicit Volitional Swallow: Able to elicit  Oral/Motor/Sensory Function Overall Oral Motor/Sensory Function: Within functional limits   Ice Chips Ice chips: Within functional limits Presentation: Spoon   Thin Liquid Thin Liquid: Within functional limits Presentation: Straw    Nectar Thick Nectar Thick Liquid: Not tested   Honey Thick Honey Thick Liquid: Not tested   Puree Puree: Within functional limits Presentation: Cheryl Monroe I. [name], MS, CCC-SLP Acute Rehabilitation Services Neuro Diagnostic Specialist Office number [phone]    Cheryl Monroe 02/05/2023,4:05 PM

## 2023-02-05 NOTE — H&P (Signed)
History and Physical    Patient: Cheryl Monroe ZOX:[id number] DOB: Mar 29, 1935 DOA: 02/04/2023 DOS: the patient was seen and examined on 02/05/2023 PCP: Mahlon Gammon, MD  Patient coming from: [facility] via EMS  Chief Complaint:  Chief Complaint  Patient presents with   Cough   HPI: Cheryl Monroe is a 88 y.o. female with medical history significant of  Alzheimer's dementia with aphasia, hypertension, orthostatic hypotension,  recurrent UTIs, and urinary incontinence who presented due to plaints of cough.  History is limited from the patient and  additional information is obtained from review of records and her son over the phone. He had gone to visit her 3 days ago and noted that she had a little congestion and cough.  However, the following day she seemed to be more lethargic and less interactive than usual.  He states that usually whenever she has a infection it does make her more confused and worsens her ability to speak.  It was also reported that she had a possible choking event while coughing reported only of her own mucus.  Normally patient is on a regular diet without restriction.  She had been seen by Dr. Chales Abrahams on 4/15.  A portable chest x-ray had been obtained that showed a left lobe zone, acute perihilar inflammation, and left lower lobe atelectasis.  Due to her symptoms she had been started on doxycycline complete a 7-day course as well as Mucinex.  Patient has been placed on 2 L nasal cannula oxygen after O2 saturations were noted to be less than 90%.  Patient's son notes that at [facility] she has basically been wheelchair-bound due to the facility not having the staff.  He request PT/OT to evaluate the patient to come up with a better plan for ambulation.  She has no prior history  of asthma or tobacco use.  In the emergency department patient was noted to be tachypneic with O2 saturations currently maintained on 2 L nasal cannula oxygen.  Labs noted to be within  normal limits, albumin 1.2.  Lactic acid 1.2, high-sensitivity troponins negative x 2.  Chest x-ray noted new bibasilar opacities which likely due to atelectasis.  Urinalysis noted large leukocytes, few bacteria present, and greater than 50 WBCs.  CT angio of the chest that noted on the left lower lobe atelectasis/collapse with air bronchograms concerning for atelectasis versus pneumonia.  Patient had been started on Parikh antibiotics of Rocephin and azithromycin.  Review of Systems: unable to review all systems due to the inability of the patient to answer questions. Past Medical History:  Diagnosis Date   Alzheimer disease    Balance problem 07/19/2020   Constipation 07/19/2020   Dementia without behavioral disturbance 07/19/2020   MMSE 21/30 07/21/20   Fall    Osteoarthritis    Pyelonephritis    Spinal stenosis 07/19/2020   Weakness of left lower extremity 07/19/2020   History reviewed. No pertinent surgical history. Social History:  reports that she has never smoked. She has never used smokeless tobacco. She reports that she does not currently use alcohol. She reports that she does not currently use drugs.  Allergies  Allergen Reactions   Sulfa Antibiotics Rash    Rash to trunk, legs, neck, scalp, and arms    Family History  Problem Relation Age of Onset   ALS Mother     Prior to Admission medications   Medication Sig Start Date End Date Taking? Authorizing Provider  acetaminophen (TYLENOL) 500 MG tablet Take 2 tablets (1,000  mg total) by mouth in the morning. 12/25/21   Fargo, Amy E, NP  acetaminophen (TYLENOL) 500 MG tablet Take 500 mg by mouth 2 (two) times daily as needed.    [provider]  Cranberry (THERACRAN PO) Take 250 mg by mouth 2 (two) times daily.    [provider]  Cranberry 250 MG TABS Take 1 tablet by mouth every morning.    [provider]  docusate sodium (COLACE) 100 MG capsule Take 100 mg by mouth 2 (two) times daily as needed for  mild constipation.    [provider]  doxycycline (VIBRAMYCIN) 100 MG capsule Take 100 mg by mouth 2 (two) times daily.  02/10/23  [provider]  Emollient (CERAVE) LOTN Apply topically daily. Apply Cerave lotion to back and extremities    [provider]  estradiol (ESTRING) 2 MG vaginal ring Place 2 mg vaginally every 3 (three) months. follow package directions 07/19/20   Renato Gails, Tiffany L, DO  hydrALAZINE (APRESOLINE) 10 MG tablet Take 10 mg by mouth every 8 (eight) hours as needed.    [provider]  ibuprofen (ADVIL) 200 MG tablet Take 400 mg by mouth 3 (three) times daily as needed for mild pain.    [provider]  ipratropium-albuterol (DUONEB) 0.5-2.5 (3) MG/3ML SOLN Take 3 mLs by nebulization in the morning and at bedtime for 3 days. 02/04/23 02/07/23  Fargo, Amy E, NP  losartan (COZAAR) 25 MG tablet Take 1 tablet (25 mg total) by mouth daily. 05/14/22   Nahser, Deloris Ping, MD  nitrofurantoin (MACRODANTIN) 50 MG capsule Take 50 mg by mouth 2 (two) times daily.    [provider]  polyethylene glycol (MIRALAX / GLYCOLAX) 17 g packet Take 17 g by mouth every other day.    [provider]  senna (SENOKOT) 8.6 MG TABS tablet Take 2 tablets by mouth at bedtime.    [provider]  shark liver oil-cocoa butter (PREPARATION H) 0.25-3-85.5 % suppository Place 1 suppository rectally as needed for hemorrhoids.    [provider]  tamsulosin (FLOMAX) 0.4 MG CAPS capsule Take 0.4 mg by mouth at bedtime.    [provider]  triamcinolone cream (KENALOG) 0.1 % Apply 1 Application topically 2 (two) times daily as needed. To temple area x 4 weeks    [provider]    Physical Exam: Vitals:   02/05/23 0600 02/05/23 0615 02/05/23 0630 02/05/23 0645  BP: (!) 161/73 (!) 153/86 (!) 165/65 (!) 160/60  Pulse: 61 62 62 65  Resp: (!) 22 (!) 25 (!) 25 (!) 23  Temp:      TempSrc:      SpO2: 100% 100% 100% 100%   Weight:       Constitutional: Elderly female who is to be ill but in no acute distress Eyes: PERRL, lids and conjunctivae normal ENMT: Mucous membranes are moist.   Neck: normal, supple  Respiratory: Mildly tachypneic with rhonchi appreciated in mid to lower lower left lung field.  Patient current on room air. Cardiovascular: Regular rate and rhythm, no murmurs / rubs / gallops. No extremity edema.   Abdomen: no tenderness, no masses palpated.  Bowel sounds positive.  Musculoskeletal: no clubbing / cyanosis. No joint deformity upper and lower extremities. Good ROM, no contractures. Normal muscle tone.  Skin: no rashes, lesions, ulcers.   Neurologic: CN 2-12 grossly intact.   Patient able to move all extremities.  Expressive aphasia. Psychiatric: Alert, but unable to assess due to  expressive aphasia  Data Reviewed:  EKG revealed normal sinus rhythm at 73 bpm with right bundle branch block.  Reviewed labs, imaging, and pertinent records as noted above in the HPI  Assessment and Plan:  Community-acquired pneumonia Acute.  Patient had reportedly had a episode where she got choked up 2 days ago.  Since that time she had seemed to have more congestion and cough.  She has seen her primary care provider 2 days ago and was started on doxycycline.  She was also noted to have O2 saturations less than 90% for which she was started on 2 L of nasal cannula oxygen.  CT angiogram noting no pulmonary embolism and left lower lobe atelectasis/collapse with air bronchograms concerning for atelectasis versus pneumonia.  Question if possible choking episode related.  Patient has been started on empiric antibiotics of Rocephin and azithromycin. -Admit to a telemetry bed -Continue pulse oximetry with nasal cannula oxygen maintain O2 saturation greater than 92%.    -Aspiration precautions elevation of the head of the bed -Pulmonary toiletry every 4 hours while awake -Concerns around concern for bowel -Check  procalcitonin and CRP -Continue empiric antibiotics of Rocephin and azithromycin -Scheduled albuterol nebs twice daily and as needed -Mucinex -Speech therapy consulted to evaluate.  Possible urinary tract infection Present on arrival.  Patient with history of recurrent urinary tract infections.  Urinalysis noted large leukocytes, few bacteria, 6-10 RBC/hpf, and greater than 50 WBCs. -Follow-up urine culture -Adequate coverage with antibiotics as noted above  Alzheimer's dementia, without behavior disturbance Expressive aphasia At baseline patient has expressive aphasia due to  Alzheimer's dementia.  No prior history of issues with swallowing previously. -Follow-up speech therapy evaluation  Weakness/debility Patient had previously been able to ambulate, but had been mostly wheelchair-bound here lately. -PT/OT to eval and treat to see if a better plan can be formulated to get the patient ambulating again  DVT prophylaxis: Lovenox Advance Care Planning:   Code Status: DNR    Consults: None  Family Communication: Son updated over the phone  Severity of Illness: The appropriate patient status for this patient is INPATIENT. Inpatient status is judged to be reasonable and necessary in order to provide the required intensity of service to ensure the patient's safety. The patient's presenting symptoms, physical exam findings, and initial radiographic and laboratory data in the context of their chronic comorbidities is felt to place them at high risk for further clinical deterioration. Furthermore, it is not anticipated that the patient will be medically stable for discharge from the hospital within 2 midnights of admission.   * I certify that at the point of admission it is my clinical judgment that the patient will require inpatient hospital care spanning beyond 2 midnights from the point of admission due to high intensity of service, high risk for further deterioration and high frequency of  surveillance required.*  Author: Clydie Braun, MD 02/05/2023 8:35 AM  For on call review [email or web address].

## 2023-02-06 ENCOUNTER — Encounter: Payer: Self-pay | Admitting: Internal Medicine

## 2023-02-06 ENCOUNTER — Inpatient Hospital Stay (HOSPITAL_COMMUNITY): Payer: Medicare Other

## 2023-02-06 DIAGNOSIS — J189 Pneumonia, unspecified organism: Secondary | ICD-10-CM | POA: Diagnosis not present

## 2023-02-06 LAB — RESPIRATORY PANEL BY PCR

## 2023-02-06 LAB — CULTURE, RESPIRATORY W GRAM STAIN

## 2023-02-06 MED ORDER — MELATONIN 3 MG PO TABS
3.0000 mg | ORAL_TABLET | Freq: Every day | ORAL | Status: DC
  Administered 2023-02-06 – 2023-02-11 (×6): 3 mg via ORAL
  Filled 2023-02-06 (×6): qty 1

## 2023-02-06 MED ORDER — GUAIFENESIN ER 600 MG PO TB12
600.0000 mg | ORAL_TABLET | Freq: Two times a day (BID) | ORAL | Status: DC
  Administered 2023-02-06 – 2023-02-11 (×11): 600 mg via ORAL
  Filled 2023-02-06 (×11): qty 1

## 2023-02-06 MED ORDER — AZITHROMYCIN 500 MG PO TABS
500.0000 mg | ORAL_TABLET | Freq: Every day | ORAL | Status: AC
  Administered 2023-02-06 – 2023-02-08 (×2): 500 mg via ORAL
  Filled 2023-02-06 (×3): qty 1

## 2023-02-06 MED ORDER — HYDRALAZINE HCL 10 MG PO TABS
10.0000 mg | ORAL_TABLET | Freq: Three times a day (TID) | ORAL | Status: DC
  Administered 2023-02-06 – 2023-02-09 (×10): 10 mg via ORAL
  Filled 2023-02-06 (×11): qty 1

## 2023-02-06 NOTE — Progress Notes (Signed)
PROGRESS NOTE    Cheryl Monroe  NFA:[id number] DOB: [birth year] DOA: 02/04/2023 PCP: Mahlon Gammon, MD    Brief Narrative:  Cheryl Monroe is a 88 y.o. female with medical history significant of  Alzheimer's dementia with aphasia, hypertension, orthostatic hypotension,  recurrent UTIs, and urinary incontinence who presented due to plaints of cough.  History is limited from the patient and  additional information is obtained from review of records and her son over the phone. He had gone to visit her 3 days ago and noted that she had a little congestion and cough.  However, the following day she seemed to be more lethargic and less interactive than usual.  He states that usually whenever she has a infection it does make her more confused and worsens her ability to speak.  It was also reported that she had a possible choking event while coughing reported only of her own mucus.    Assessment and Plan: Community-acquired pneumonia vs viral URI Acute.  Patient had reportedly had a episode where she got choked up 2 days ago.  Since that time she had seemed to have more congestion and cough.  She has seen her primary care provider 2 days ago and was started on doxycycline.  She was also noted to have O2 saturations less than 90% for which she was started on 2 L of nasal cannula oxygen.  CT angiogram noting no pulmonary embolism and left lower lobe atelectasis/collapse with air bronchograms concerning for atelectasis versus pneumonia.  Question if possible choking episode related.   -procalcitonin low-- recheck in AM -NP swab to r/o viruses like metapneumovirus etc -Continue pulse oximetry with nasal cannula oxygen maintain O2 saturation greater than 92%.    -Aspiration precautions elevation of the head of the bed -Pulmonary toiletry every 4 hours while awake -Continue empiric antibiotics of Rocephin and azithromycin for now -Scheduled albuterol nebs twice daily and as needed -Mucinex -culture if  able -Speech therapy consulted -- regular diet   Possible urinary tract infection Present on arrival.  Patient with history of recurrent urinary tract infections.  Urinalysis noted large leukocytes, few bacteria, 6-10 RBC/hpf, and greater than 50 WBCs. -Follow-up urine culture -Adequate coverage with antibiotics for PNA   Alzheimer's dementia, without behavior disturbance Expressive aphasia At baseline patient has expressive aphasia due to  Alzheimer's dementia.  No prior history of issues with swallowing previously.   Weakness/debility Patient had previously been able to ambulate, but had been mostly wheelchair-bound here lately. -PT/OT to eval and treat to see if a better plan can be formulated to get the patient ambulating again -family request PM&R   DVT prophylaxis: enoxaparin (LOVENOX) injection 40 mg Start: 02/05/23 1000    Code Status: DNR Family Communication: daughter on phone  Disposition Plan:  Level of care: Telemetry Medical Status is: Inpatient Remains inpatient appropriate because: needs further work up and PT eval    Consultants:  none   Subjective: Asking if daughter was here Denies SOB, denies CP  Objective: Vitals:   02/06/23 0230 02/06/23 0607 02/06/23 0713 02/06/23 0804  BP: (!) 160/80 (!) 168/85  (!) 141/81  Pulse: 65 72  79  Resp: Temp:  97.9 F (36.6 C)  98.2 F (36.8 C)  TempSrc:    Oral  SpO2:  99% 95% 97%  Weight:        Intake/Output Summary (Last 24 hours) at 02/06/2023 1329 Last data filed at 02/06/2023 0911 Gross per 24 hour  Intake  3 ml  Output 500 ml  Net -497 ml   Filed Weights   02/04/23 2109  Weight: 72 kg    Examination:   General: Appearance:     Overweight female in no acute distress     Lungs:     Clear to auscultation bilaterally, respirations unlabored  Heart:    Normal heart rate. Normal rhythm. No murmurs, rubs, or gallops.    MS:   All extremities are intact.    Neurologic:   Awake, alert,  oriented x 3. No apparent focal neurological           defect.        Data Reviewed: I have personally reviewed following labs and imaging studies  CBC: Recent Labs  Lab 02/03/23 0000 02/04/23 2205  WBC 6.7 6.5  NEUTROABS  --  4.0  HGB 15.4 12.8  HCT 47* 40.1  MCV  --  92.0  PLT  --  166   Basic Metabolic Panel: Recent Labs  Lab 02/03/23 0000 02/04/23 2205 02/05/23 1023  NA 138 137 139  K 4.3 3.8 3.7  CL 104 101 106  CO2 26* 26 24  GLUCOSE  --  124* 94  BUN 14 21 13   CREATININE 0.8 0.92 0.61  CALCIUM 9.2 8.5* 8.4*   GFR: Estimated Creatinine Clearance: 47.8 mL/min (by C-G formula based on SCr of 0.61 mg/dL). Liver Function Tests: Recent Labs  Lab 02/03/23 0000 02/04/23 2205  AST  --  24  ALT  --  19  ALKPHOS 96 82  BILITOT  --  0.9  PROT  --  6.0*  ALBUMIN 3.9 2.7*   No results for input(s): "LIPASE", "AMYLASE" in the last 168 hours. No results for input(s): "AMMONIA" in the last 168 hours. Coagulation Profile: No results for input(s): "INR", "PROTIME" in the last 168 hours. Cardiac Enzymes: No results for input(s): "CKTOTAL", "CKMB", "CKMBINDEX", "TROPONINI" in the last 168 hours. BNP (last 3 results) No results for input(s): "PROBNP" in the last 8760 hours. HbA1C: No results for input(s): "HGBA1C" in the last 72 hours. CBG: No results for input(s): "GLUCAP" in the last 168 hours. Lipid Profile: No results for input(s): "CHOL", "HDL", "LDLCALC", "TRIG", "CHOLHDL", "LDLDIRECT" in the last 72 hours. Thyroid Function Tests: No results for input(s): "TSH", "T4TOTAL", "FREET4", "T3FREE", "THYROIDAB" in the last 72 hours. Anemia Panel: No results for input(s): "VITAMINB12", "FOLATE", "FERRITIN", "TIBC", "IRON", "RETICCTPCT" in the last 72 hours. Sepsis Labs: Recent Labs  Lab 02/04/23 2205 02/05/23 1023  PROCALCITON  --  <0.10  LATICACIDVEN 1.2  --     Recent Results (from the past 240 hour(s))  SARS Coronavirus 2 by RT PCR (hospital order,  performed in [facility] hospital lab) *cepheid single result test* Anterior Nasal Swab     Status: None   Collection Time: 02/04/23  9:29 PM   Specimen: Anterior Nasal Swab  Result Value Ref Range Status   SARS Coronavirus 2 by RT PCR NEGATIVE NEGATIVE Final    Comment: Performed at [facility], [address] [place], [place] [address]  Culture, blood (routine x 2)     Status: None (Preliminary result)   Collection Time: 02/04/23  9:50 PM   Specimen: BLOOD  Result Value Ref Range Status   Specimen Description BLOOD SITE NOT SPECIFIED  Final   Special Requests   Final    BOTTLES DRAWN AEROBIC AND ANAEROBIC Blood Culture results may not be optimal due to an inadequate volume of blood  received in culture bottles   Culture   Final    NO GROWTH 2 DAYS Performed at [facility], [address], [place], [place] [address]    Report Status PENDING  Incomplete  Culture, blood (routine x 2)     Status: None (Preliminary result)   Collection Time: 02/04/23 10:05 PM   Specimen: BLOOD  Result Value Ref Range Status   Specimen Description BLOOD SITE NOT SPECIFIED  Final   Special Requests   Final    BOTTLES DRAWN AEROBIC AND ANAEROBIC Blood Culture adequate volume   Culture   Final    NO GROWTH 2 DAYS Performed at [facility], [address], [place], [place] [address]    Report Status PENDING  Incomplete         Radiology Studies: DG Swallowing Func-Speech Pathology  Result Date: 02/06/2023 Table formatting from the original result was not included. Modified Barium Swallow Study Patient Details Name: Cataleia Gade MRN: [id number] Date of Birth: [birth year] Today's Date: 02/06/2023 HPI/PMH: HPI: Pt is an 88 y.o. female who presented with cough. CT chest: Left lower lobe atelectasis/collapse with air bronchograms. Differential considerations include atelectasis (favored) versus pneumonia. Pt dx with pneumonia. PMH:  advanced Alzheimer's dementia, CVA with residual mild  expressive aphasia and gait imbalance, neurogenic bladder with recurrent UTI. Clinical Impression: Clinical Impression: Pt was seen in radiology suite for modified barium swallow study. Pt's oropharyngeal swallow mechanism was within functional limits, but presbyphagia was demonstrated characterized by reduced tongue base retraction, intermittent delays in swallowing initiation, and reduced anterior laryngeal excursion. Trace residue was noted in the valleculae and pyriform sinuses which was often cleared with the pt's independent use of secondary swallows. There was one instance when pt propelled a regular texture bolus to the hyopharynx and then required a thin liquid bolus to initiate the swallow. SLP suspects that this was likely secondary to pts' difficulty attending in the midst of distractions and her becoming preoccupied with towel that was on her. No instances of penetration or aspiration were demonstrated despite challenges of larger boluses and consecutive swallows of thin liquids. It is recommended that the pt's current diet of regular texture solids and thin liquids be continued at this time, but that distractions be minimized during meals. Further skilled SLP services are not clinically indicated at this time. Factors that may increase risk of adverse event in presence of aspiration [name] & [name] 2021): Factors that may increase risk of adverse event in presence of aspiration [name] & [name] 2021): Reduced cognitive function Recommendations/Plan: Swallowing Evaluation Recommendations Swallowing Evaluation Recommendations Recommendations: PO diet PO Diet Recommendation: Regular; Thin liquids (Level 0) Liquid Administration via: Cup; Straw Medication Administration: Whole meds with liquid Supervision: Patient able to self-feed Swallowing strategies  : Minimize environmental distractions Postural changes: Position pt fully upright for meals Oral care recommendations: Oral care BID (2x/day) Treatment  Plan Treatment Plan Treatment recommendations: No treatment recommended at this time Follow-up recommendations: No SLP follow up Functional status assessment: Patient has not had a recent decline in their functional status. Recommendations Recommendations for follow up therapy are one component of a multi-disciplinary discharge planning process, led by the attending physician.  Recommendations may be updated based on patient status, additional functional criteria and insurance authorization. Assessment: Orofacial Exam: Orofacial Exam Oral Cavity - Dentition: Adequate natural dentition Anatomy: Anatomy: WFL Boluses Administered: Boluses Administered Boluses Administered: Thin liquids (Level 0); Mildly thick liquids (Level 2, nectar thick); Moderately thick liquids (Level 3, honey thick);  Solid; Puree  Oral Impairment Domain: Oral Impairment Domain Lip Closure: No labial escape Tongue control during bolus hold: Posterior escape of less than half of bolus Bolus preparation/mastication: Timely and efficient chewing and mashing Bolus transport/lingual motion: Slow tongue motion Oral residue: Complete oral clearance Location of oral residue : N/A Initiation of pharyngeal swallow : Valleculae; Posterior laryngeal surface of the epiglottis  Pharyngeal Impairment Domain: Pharyngeal Impairment Domain Soft palate elevation: No bolus between soft palate (SP)/pharyngeal wall (PW) Laryngeal elevation: Complete superior movement of thyroid cartilage with complete approximation of arytenoids to epiglottic petiole Anterior hyoid excursion: Partial anterior movement Epiglottic movement: Complete inversion Laryngeal vestibule closure: Complete, no air/contrast in laryngeal vestibule Pharyngeal stripping wave : Present - complete Pharyngeal contraction (A/P view only): N/A Pharyngoesophageal segment opening: Complete distension and complete duration, no obstruction of flow Tongue base retraction: Trace column of contrast or air between  tongue base and PPW Pharyngeal residue: Trace residue within or on pharyngeal structures Location of pharyngeal residue: Valleculae  Esophageal Impairment Domain: Esophageal Impairment Domain Esophageal clearance upright position: Complete clearance, esophageal coating Pill: Esophageal Impairment Domain Esophageal clearance upright position: Complete clearance, esophageal coating Penetration/Aspiration Scale Score: Penetration/Aspiration Scale Score 1.  Material does not enter airway: Thin liquids (Level 0); Mildly thick liquids (Level 2, nectar thick); Moderately thick liquids (Level 3, honey thick); Puree; Solid; Pill Compensatory Strategies: No data recorded  General Information: Caregiver present: No  Diet Prior to this Study: Regular; Thin liquids (Level 0)   Temperature : Normal   Respiratory Status: WFL   Supplemental O2: None (Room air)   History of Recent Intubation: No  Behavior/Cognition: Alert; Cooperative; Pleasant mood; Doesn't follow directions; Requires cueing Self-Feeding Abilities: Needs set-up for self-feeding Baseline vocal quality/speech: Normal Volitional Cough: Able to elicit Volitional Swallow: Able to elicit Exam Limitations: No limitations Goal Planning: No data recorded No data recorded No data recorded Patient/Family Stated Goal: none stated Consulted and agree with results and recommendations: Patient; Family member/caregiver Pain: Pain Assessment Pain Assessment: No/denies pain End of Session: Start Time:SLP Start Time (ACUTE ONLY): 1130 Stop Time: SLP Stop Time (ACUTE ONLY): 1145 Time Calculation:SLP Time Calculation (min) (ACUTE ONLY): 15 min Charges: SLP Evaluations $ SLP Speech Visit: 1 Visit SLP Evaluations $BSS Swallow: 1 Procedure $MBS Swallow: 1 Procedure SLP visit diagnosis: SLP Visit Diagnosis: Dysphagia, unspecified (R13.10) Past Medical History: Past Medical History: Diagnosis Date  Alzheimer disease   Balance problem 07/19/2020  Constipation 07/19/2020  Dementia without  behavioral disturbance 07/19/2020  MMSE 21/30 07/21/20  Fall   Osteoarthritis   Pyelonephritis   Spinal stenosis 07/19/2020  Weakness of left lower extremity 07/19/2020 Past Surgical History: No past surgical history on file. Shanika I. [name], MS, CCC-SLP [facility] Diagnostic Specialist Office number [phone] [name] 02/06/2023, 12:35 PM  CT Angio Chest PE W/Cm &/Or Wo Cm  Result Date: 02/05/2023 CLINICAL DATA:  Cough, possible aspiration pneumonia EXAM: CT ANGIOGRAPHY CHEST WITH CONTRAST TECHNIQUE: Multidetector CT imaging of the chest was performed using the standard protocol during bolus administration of intravenous contrast. Multiplanar CT image reconstructions and MIPs were obtained to evaluate the vascular anatomy. RADIATION DOSE REDUCTION: This exam was performed according to the departmental dose-optimization program which includes automated exposure control, adjustment of the mA and/or kV according to patient size and/or use of iterative reconstruction technique. CONTRAST:  75mL OMNIPAQUE IOHEXOL 350 MG/ML SOLN COMPARISON:  Chest radiograph dated 02/04/2023 FINDINGS: Cardiovascular: Satisfactory opacification of the bilateral pulmonary arteries to the lobar level. No  evidence of pulmonary embolism. Study is not tailored for evaluation of the thoracic aorta. Ascending thoracic aorta measures 4.0 cm. Atherosclerotic calcifications of the aortic arch. The heart is normal in size.  No pericardial effusion. Moderate three-vessel coronary atherosclerosis. Mediastinum/Nodes: No suspicious mediastinal lymphadenopathy. Left thyroid goiter, without discrete nodule. Lungs/Pleura: Left lower lobe atelectasis/collapse with air bronchograms. Mild right lower lobe opacity, likely atelectasis. No suspicious pulmonary nodules. No pleural effusion or pneumothorax. Upper Abdomen: Visualized upper abdomen is grossly unremarkable, noting vascular calcifications. Musculoskeletal:  Visualized osseous structures are within normal limits. Review of the MIP images confirms the above findings. IMPRESSION: No evidence of pulmonary embolism. Left lower lobe atelectasis/collapse with air bronchograms. Differential considerations include atelectasis (favored) versus pneumonia. Ascending thoracic aorta measures 4.0 cm. Recommend annual imaging followup by CTA or MRA. This recommendation follows 2010 ACCF/AHA/AATS/ACR/ASA/SCA/SCAI/SIR/STS/SVM Guidelines for the Diagnosis and Management of Patients with Thoracic Aortic Disease. Circulation. 2010; 121: Z610-R604. Aortic aneurysm NOS (ICD10-I71.9) Aortic Atherosclerosis (ICD10-I70.0). Electronically Signed   By: Charline Bills M.D.   On: 02/05/2023 00:12   DG Chest Port 1 View  Result Date: 02/04/2023 CLINICAL DATA:  Cough EXAM: PORTABLE CHEST 1 VIEW COMPARISON:  Chest x-ray dated April 15, 2022 FINDINGS: Rightward patient rotation limits evaluation. Within limitations, cardiac and mediastinal contours are unchanged. New mild bibasilar opacities which are likely due to atelectasis. No evidence of pleural effusion or pneumothorax. IMPRESSION: New mild bibasilar opacities which are likely due to atelectasis. Electronically Signed   By: Allegra Lai M.D.   On: 02/04/2023 21:51        Scheduled Meds:  albuterol  2.5 mg Nebulization BID   azithromycin  500 mg Oral q1800   enoxaparin (LOVENOX) injection  40 mg Subcutaneous Daily   guaiFENesin  600 mg Oral BID   hydrALAZINE  10 mg Oral Q8H   melatonin  3 mg Oral QHS   polyethylene glycol  17 g Oral QODAY   sodium chloride flush  3 mL Intravenous Q12H   Continuous Infusions:  cefTRIAXone (ROCEPHIN)  IV 2 g (02/06/23 0013)     LOS: 1 day    Time spent: 45 minutes spent on chart review, discussion with nursing staff, consultants, updating family and interview/physical exam; more than 50% of that time was spent in counseling and/or coordination of care.    Joseph Art,  DO [facility] Available via Epic secure chat 7am-7pm After these hours, please refer to coverage provider listed on [email or web address] 02/06/2023, 1:29 PM

## 2023-02-06 NOTE — Evaluation (Signed)
Occupational Therapy Evaluation Patient Details Name: Cheryl Monroe MRN: [id number] DOB: 1935/10/14 Today's Date: 02/06/2023   History of Present Illness 88 yo female presents to [facility] on 4/16 with cough, aspiration PNA. PMH includes:  Alzheimer's disease, balance deficits, OA, spinal stenosis, recurrent UTIs.   Clinical Impression    PTA, pt from [facility] w/ functional decline in recent months to require hoyer lift transfers OOB and ADL assist bed level. Per daughter, a few months ago, pt was able to transfer w/ light assist into a car and wheelchair. Pt presents now with deficits in strength and endurance though close to this most recently reported baseline. Pt requires Max A x 2 for bed mobility and transfers with RW. Pt requires extensive assist for LB ADLs (+2 assist if in standing). Pt fatigues quickly and does benefit from encouragement to participate (baseline per family). Pt has potential to progress functional transfers and standing tolerance w/ rehab follow up in most appropriate setting. Will continue to follow acutely.     Recommendations for follow up therapy are one component of a multi-disciplinary discharge planning process, led by the attending physician.  Recommendations may be updated based on patient status, additional functional criteria and insurance authorization.   Assistance Recommended at Discharge Frequent or constant Supervision/Assistance  Patient can return home with the following Two people to help with walking and/or transfers;Two people to help with bathing/dressing/bathroom    Functional Status Assessment  Patient has had a recent decline in their functional status and demonstrates the ability to make significant improvements in function in a reasonable and predictable amount of time.  Equipment Recommendations  None recommended by OT    Recommendations for Other Services       Precautions / Restrictions Precautions Precautions:  Sternal Restrictions Weight Bearing Restrictions: No      Mobility Bed Mobility Overal bed mobility: Needs Assistance Bed Mobility: Supine to Sit     Supine to sit: Max assist, +2 for safety/equipment, +2 for physical assistance, HOB elevated     General bed mobility comments: some assist to advance LE/trunk but overall extensive +2 assist    Transfers Overall transfer level: Needs assistance Equipment used: Rolling walker (2 wheels) Transfers: Bed to chair/wheelchair/BSC, Sit to/from Stand Sit to Stand: Mod assist, +2 physical assistance, +2 safety/equipment     Step pivot transfers: Max assist, +2 physical assistance, +2 safety/equipment     General transfer comment: Mod A x 2 for 2-3 standing trials with RW briefly, difficulty with side steps but daughter hopeful for pt to get into chair. Due to fatigue, began to require Max A x 2 for pivot to chair with assist for balance/DME mgmt and sequencing cues      Balance Overall balance assessment: Needs assistance Sitting-balance support: No upper extremity supported, Feet supported Sitting balance-Leahy Scale: Fair     Standing balance support: Bilateral upper extremity supported, During functional activity Standing balance-Leahy Scale: Poor                             ADL either performed or assessed with clinical judgement   ADL Overall ADL's : Needs assistance/impaired Eating/Feeding: Set up;Sitting   Grooming: Minimal assistance;Sitting   Upper Body Bathing: Moderate assistance;Sitting   Lower Body Bathing: Maximal assistance;+2 for physical assistance;+2 for safety/equipment;Sit to/from stand   Upper Body Dressing : Moderate assistance;Sitting   Lower Body Dressing: Total assistance;+2 for physical assistance;+2 for safety/equipment;Sit to/from stand  Toileting- Clothing Manipulation and Hygiene: Total assistance;+2 for physical assistance;+2 for safety/equipment;Sit to/from stand          General ADL Comments: Limited by weakness w/ prolonged time in bed, some difficulty with clearing secretions during session (may benefit from suction device).     Vision Ability to See in Adequate Light: 0 Adequate Patient Visual Report: No change from baseline Vision Assessment?: No apparent visual deficits     Perception     Praxis      Pertinent Vitals/Pain       Hand Dominance Right   Extremity/Trunk Assessment Upper Extremity Assessment Upper Extremity Assessment: Generalized weakness;Difficult to assess due to impaired cognition   Lower Extremity Assessment Lower Extremity Assessment: Defer to PT evaluation   Cervical / Trunk Assessment Cervical / Trunk Assessment: Kyphotic   Communication Communication Communication: Expressive difficulties   Cognition Arousal/Alertness: Awake/alert Behavior During Therapy: WFL for tasks assessed/performed Overall Cognitive Status: History of cognitive impairments - at baseline                                 General Comments: hx of dementia, follows commands consistently, laughing throughout evaluation. does benefit from encouragement to participate     General Comments  Daughter present, concerned about pt's decline and inquiring about various rehab options. per daughter, pt w/ decreased motivation to participate and has had some delirium when moved to rehab hall of her LTC as this was a different environment.    Exercises     Shoulder Instructions      Home Living Family/patient expects to be discharged to:: Skilled nursing facility                                 Additional Comments: [facility]      Prior Functioning/Environment Prior Level of Function : Needs assist             Mobility Comments: previously using RW for mobility, now using wheelchair for mobility now for past few months; was able to stand and transfer w/ light assist in March to/from a car but now has been  using hoyer lift ADLs Comments: "they do everything" in regards to ADLs per daughter. pt is able to feed self and assist with basic tasks; reports a hx of self limiting behaviors        OT Problem List: Decreased strength;Decreased activity tolerance;Impaired balance (sitting and/or standing);Decreased cognition;Decreased knowledge of use of DME or AE      OT Treatment/Interventions: Self-care/ADL training;Therapeutic exercise;Energy conservation;DME and/or AE instruction;Therapeutic activities    OT Goals(Current goals can be found in the care plan section) Acute Rehab OT Goals Patient Stated Goal: daughter would like pt to have intensive rehab, pt reports "do nothing" when asked about her goals OT Goal Formulation: With patient/family Time For Goal Achievement: 02/20/23 Potential to Achieve Goals: Fair  OT Frequency: Min 2X/week    Co-evaluation PT/OT/SLP Co-Evaluation/Treatment: Yes Reason for Co-Treatment: Necessary to address cognition/behavior during functional activity;For patient/therapist safety;To address functional/ADL transfers   OT goals addressed during session: Proper use of Adaptive equipment and DME      AM-PAC OT "6 Clicks" Daily Activity     Outcome Measure Help from another person eating meals?: A Little Help from another person taking care of personal grooming?: A Little Help from another person toileting, which includes using toliet, bedpan,  or urinal?: Total Help from another person bathing (including washing, rinsing, drying)?: A Lot Help from another person to put on and taking off regular upper body clothing?: A Lot Help from another person to put on and taking off regular lower body clothing?: Total 6 Click Score: 12   End of Session Equipment Utilized During Treatment: Gait belt;Rolling walker (2 wheels) Nurse Communication: Mobility status  Activity Tolerance: Patient tolerated treatment well Patient left: in chair;with call bell/phone within  reach;with chair alarm set;with family/visitor present  OT Visit Diagnosis: Unsteadiness on feet (R26.81);Other abnormalities of gait and mobility (R26.89);Muscle weakness (generalized) (M62.81)                Time: 2956-2130 OT Time Calculation (min): 43 min Charges:  OT General Charges $OT Visit: 1 Visit OT Evaluation $OT Eval Moderate Complexity: 1 Mod OT Treatments $Therapeutic Activity: 8-22 mins  Bradd Canary, OTR/L Acute Rehab Services Office: [phone]   Lorre Munroe 02/06/2023, 1:54 PM

## 2023-02-06 NOTE — Progress Notes (Signed)
PHARMACIST - PHYSICIAN COMMUNICATION DR:   Vann CONCERNING: Antibiotic IV to Oral Route Change Policy  RECOMMENDATION: This patient is receiving azithromycin by the intravenous route.  Based on criteria approved by the Pharmacy and Therapeutics Committee, the antibiotic(s) is/are being converted to the equivalent oral dose form(s).   DESCRIPTION: These criteria include:  Patient being treated for a respiratory tract infection, urinary tract infection, cellulitis or clostridium difficile associated diarrhea if on metronidazole  The patient is not neutropenic and does not exhibit a GI malabsorption state  The patient is eating (either orally or via tube) and/or has been taking other orally administered medications for a least 24 hours  The patient is improving clinically and has a Tmax < 100.5  If you have questions about this conversion, please contact the Pharmacy Department  []  ( [phone] )  [place] []  ( [phone] )  [facility] [x]  ( [phone] )  [place] []  ( [phone]  [facility] []  ( [phone] )  [facility]   

## 2023-02-06 NOTE — Progress Notes (Signed)
Modified Barium Swallow Study  Patient Details  Name: Cheryl Monroe MRN: [id number] Date of Birth: 1935-05-27  Today's Date: 02/06/2023  Modified Barium Swallow completed.  Full report located under Chart Review in the Imaging Section.  History of Present Illness Pt is an 88 y.o. female who presented with cough. CT chest: Left lower lobe atelectasis/collapse with air bronchograms. Differential considerations include atelectasis (favored) versus pneumonia. Pt dx with pneumonia. 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   Clinical Impression Pt was seen in radiology suite for modified barium swallow study. Pt's oropharyngeal swallow mechanism was within functional limits, but presbyphagia was demonstrated characterized by reduced tongue base retraction, intermittent delays in swallowing initiation, and reduced anterior laryngeal excursion. Trace residue was noted in the valleculae and pyriform sinuses which was often cleared with the pt's independent use of secondary swallows. There was one instance when pt propelled a regular texture bolus to the hyopharynx and then required a thin liquid bolus to initiate the swallow. SLP suspects that this was likely secondary to pts' difficulty attending in the midst of distractions and her becoming preoccupied with towel that was on her. No instances of penetration or aspiration were demonstrated despite challenges of larger boluses and consecutive swallows of thin liquids. It is recommended that the pt's current diet of regular texture solids and thin liquids be continued at this time, but that distractions be minimized during meals. Further skilled SLP services are not clinically indicated at this time. Factors that may increase risk of adverse event in presence of aspiration Cheryl Monroe & Cheryl Monroe 2021): Reduced cognitive function  Swallow Evaluation  Recommendations Recommendations: PO diet PO Diet Recommendation: Regular;Thin liquids (Level 0) Liquid Administration via: Cup;Straw Medication Administration: Whole meds with liquid Supervision: Patient able to self-feed Swallowing strategies  : Minimize environmental distractions Postural changes: Position pt fully upright for meals Oral care recommendations: Oral care BID (2x/day)   Cheryl Monroe I. [name], MS, CCC-SLP Acute Rehabilitation Services Neuro Diagnostic Specialist Office number [phone]  Cheryl Monroe 02/06/2023,12:34 PM

## 2023-02-06 NOTE — Progress Notes (Signed)
  Inpatient Rehabilitation Admissions Coordinator   Met with patient and daughter, Dr Sherryll Burger, at bedside for rehab assessment. We discussed goals and expectations of a possible CIR admit that family is requesting.Therapy current recommendations are for return to SNF. Patient has been previously at IRF in [place] several years ago. Patient has resided at [facility] [address] since 06/2020. Was short distance ambulatory up until 3/24 with wheelchair transfers and into the car with family assist. Daughter concerned with recent decline in function and requesting PM & R consult to assess her goals,abilities and expectations for further rehab. I will discuss with Dr Riley Kill and request he consult and discuss further rehab with her daughter as requested.Please call me with any questions.   Ottie Glazier, RN, MSN Rehab Admissions Coordinator [phone]

## 2023-02-06 NOTE — Progress Notes (Signed)
Patient had quite night but sleep was fragmented. BP was high after midnight. Consulted with Dr Margo Aye, and Hydralazine 10 mg Q8 hourly (Home med regimen) added to current medications and same given. Lab draw unsuccessful this AM. Morning team will retry. Remains incontinent of urine. Denies pain. Safety ensured at all times.

## 2023-02-06 NOTE — Evaluation (Signed)
Physical Therapy Evaluation Patient Details Name: Cheryl Monroe MRN: [id number] DOB: 1935/10/04 Today's Date: 02/06/2023  History of Present Illness  88 yo female presents to [facility] on 4/16 with cough, aspiration PNA. PMH includes:  Alzheimer's disease, balance deficits, OA, spinal stenosis, recurrent UTIs.  Clinical Impression   Pt presents with generalized weakness, impaired sitting and standing balance, poor activity tolerance. Pt to benefit from acute PT to address deficits. Pt requiring max +2 assist for bed mobility and repeated sit<>stand transfers, pt able to get to recliner with stand pivot with max encouragement, assist, and daughter at bedside motivating pt. Per daughter, at recent baseline pt has been transfer level to w/c with a lift, family would like pt to be transfer-level  with RW again. PT recommends post-acute rehabilitation, likely will not tolerate higher intensity therapies. PT to progress mobility as tolerated, and will continue to follow acutely.         Recommendations for follow up therapy are one component of a multi-disciplinary discharge planning process, led by the attending physician.  Recommendations may be updated based on patient status, additional functional criteria and insurance authorization.  Follow Up Recommendations       Assistance Recommended at Discharge Frequent or constant Supervision/Assistance  Patient can return home with the following  Two people to help with walking and/or transfers;Two people to help with bathing/dressing/bathroom    Equipment Recommendations None recommended by PT  Recommendations for Other Services       Functional Status Assessment Patient has had a recent decline in their functional status and/or demonstrates limited ability to make significant improvements in function in a reasonable and predictable amount of time     Precautions / Restrictions Precautions Precautions: Fall Restrictions Weight Bearing  Restrictions: No      Mobility  Bed Mobility Overal bed mobility: Needs Assistance Bed Mobility: Supine to Sit     Supine to sit: Max assist, +2 for safety/equipment, +2 for physical assistance, HOB elevated     General bed mobility comments: assist for trunk and LE management, scooting to/from EOB    Transfers Overall transfer level: Needs assistance Equipment used: Rolling walker (2 wheels) Transfers: Bed to chair/wheelchair/BSC, Sit to/from Stand Sit to Stand: Mod assist, +2 physical assistance, +2 safety/equipment   Step pivot transfers: Max assist, +2 physical assistance, +2 safety/equipment       General transfer comment: Mod A x 2 for 3 standing trials with RW briefly, difficulty with side steps but daughter hopeful for pt to get into chair. Due to fatigue, began to require Max A x 2 for pivot to chair with assist for balance/DME mgmt and sequencing cues. Pt with LEs trembling throughout standing and stepping    Ambulation/Gait                  Stairs            Wheelchair Mobility    Modified Rankin (Stroke Patients Only)       Balance Overall balance assessment: Needs assistance Sitting-balance support: No upper extremity supported, Feet supported Sitting balance-Leahy Scale: Fair     Standing balance support: Bilateral upper extremity supported, During functional activity Standing balance-Leahy Scale: Poor                               Pertinent Vitals/Pain Pain Assessment Pain Assessment: No/denies pain    Home Living Family/patient expects to be discharged to:: Skilled nursing facility  Additional Comments: [facility]    Prior Function Prior Level of Function : Needs assist             Mobility Comments: previously using RW for mobility, now using wheelchair for mobility now for past few months; was able to stand and transfer w/ light assist in March to/from a car but now has been  using hoyer lift ADLs Comments: "they do everything" in regards to ADLs per daughter. pt is able to feed self and assist with basic tasks; reports a hx of self limiting behaviors     Hand Dominance   Dominant Hand: Right    Extremity/Trunk Assessment   Upper Extremity Assessment Upper Extremity Assessment: Generalized weakness    Lower Extremity Assessment Lower Extremity Assessment: Generalized weakness    Cervical / Trunk Assessment Cervical / Trunk Assessment: Kyphotic  Communication   Communication: Expressive difficulties (aphasic changes related to alzheimer's)  Cognition Arousal/Alertness: Awake/alert Behavior During Therapy: WFL for tasks assessed/performed Overall Cognitive Status: History of cognitive impairments - at baseline                                 General Comments: hx of dementia, follows commands consistently, laughing throughout evaluation. does benefit from encouragement to participate        General Comments General comments (skin integrity, edema, etc.): Daughter present, concerned about pt's decline and inquiring about various rehab options. per daughter, pt w/ decreased motivation to participate and has had some delirium when moved to rehab hall of her LTC as this was a different environment.    Exercises     Assessment/Plan    PT Assessment Patient needs continued PT services  PT Problem List Decreased strength;Decreased mobility;Decreased activity tolerance;Decreased balance;Decreased knowledge of use of DME;Pain;Cardiopulmonary status limiting activity;Decreased safety awareness       PT Treatment Interventions DME instruction;Therapeutic activities;Gait training;Therapeutic exercise;Patient/family education;Balance training;Functional mobility training;Neuromuscular re-education    PT Goals (Current goals can be found in the Care Plan section)  Acute Rehab PT Goals Patient Stated Goal: get pt transfer-level PT Goal  Formulation: With patient/family Time For Goal Achievement: 02/20/23 Potential to Achieve Goals: Good    Frequency Min 2X/week     Co-evaluation PT/OT/SLP Co-Evaluation/Treatment: Yes Reason for Co-Treatment: Necessary to address cognition/behavior during functional activity;For patient/therapist safety;To address functional/ADL transfers PT goals addressed during session: Mobility/safety with mobility;Balance;Proper use of DME OT goals addressed during session: Proper use of Adaptive equipment and DME       AM-PAC PT "6 Clicks" Mobility  Outcome Measure Help needed turning from your back to your side while in a flat bed without using bedrails?: A Lot Help needed moving from lying on your back to sitting on the side of a flat bed without using bedrails?: A Lot Help needed moving to and from a bed to a chair (including a wheelchair)?: A Lot Help needed standing up from a chair using your arms (e.g., wheelchair or bedside chair)?: A Lot Help needed to walk in hospital room?: Total Help needed climbing 3-5 steps with a railing? : Total 6 Click Score: 10    End of Session Equipment Utilized During Treatment: Gait belt Activity Tolerance: Patient limited by fatigue Patient left: in chair;with call bell/phone within reach;with chair alarm set Nurse Communication: Mobility status PT Visit Diagnosis: Other abnormalities of gait and mobility (R26.89);Muscle weakness (generalized) (M62.81)    Time: 1610-9604 PT Time Calculation (min) (ACUTE ONLY):  43 min   Charges:   PT Evaluation $PT Eval Low Complexity: 1 Low          Rajni Holsworth S, PT DPT Acute Rehabilitation Services Pager [phone]  Office [phone]   Truddie Coco 02/06/2023, 3:22 PM

## 2023-02-06 NOTE — Progress Notes (Signed)
A user error has taken place.

## 2023-02-07 DIAGNOSIS — F02C0 Dementia in other diseases classified elsewhere, severe, without behavioral disturbance, psychotic disturbance, mood disturbance, and anxiety: Secondary | ICD-10-CM | POA: Diagnosis not present

## 2023-02-07 DIAGNOSIS — G301 Alzheimer's disease with late onset: Secondary | ICD-10-CM | POA: Diagnosis not present

## 2023-02-07 DIAGNOSIS — R5381 Other malaise: Secondary | ICD-10-CM | POA: Diagnosis not present

## 2023-02-07 DIAGNOSIS — J189 Pneumonia, unspecified organism: Secondary | ICD-10-CM | POA: Diagnosis not present

## 2023-02-07 DIAGNOSIS — R4701 Aphasia: Secondary | ICD-10-CM | POA: Diagnosis not present

## 2023-02-07 LAB — BASIC METABOLIC PANEL
Anion gap: 13 (ref 5–15)
BUN: 13 mg/dL (ref 8–23)
CO2: 21 mmol/L — ABNORMAL LOW (ref 22–32)
Calcium: 8.8 mg/dL — ABNORMAL LOW (ref 8.9–10.3)
Chloride: 103 mmol/L (ref 98–111)
Creatinine, Ser: 0.76 mg/dL (ref 0.44–1.00)
GFR, Estimated: >60 mL/min (ref >60)
Glucose, Bld: 110 mg/dL — ABNORMAL HIGH (ref 70–99)
Potassium: 3.6 mmol/L (ref 3.5–5.1)
Sodium: 137 mmol/L (ref 135–145)

## 2023-02-07 LAB — CALCIUM, IONIZED: Calcium, Ionized, Serum: 4.7 mg/dL (ref 4.5–5.6)

## 2023-02-07 LAB — CBC
HCT: 40.4 % (ref 36.0–46.0)
Hemoglobin: 13.4 g/dL (ref 12.0–15.0)
MCH: 28.9 pg (ref 26.0–34.0)
MCHC: 33.2 g/dL (ref 30.0–36.0)
MCV: 87.1 fL (ref 80.0–100.0)
Platelets: 182 10*3/uL (ref 150–400)
RBC: 4.64 MIL/uL (ref 3.87–5.11)
RDW: 12.7 % (ref 11.5–15.5)
WBC: 7.9 10*3/uL (ref 4.0–10.5)
nRBC: 0 % (ref 0.0–0.2)

## 2023-02-07 LAB — EXPECTORATED SPUTUM ASSESSMENT W GRAM STAIN, RFLX TO RESP C

## 2023-02-07 LAB — CULTURE, RESPIRATORY W GRAM STAIN: Gram Stain: NONE SEEN

## 2023-02-07 LAB — PROCALCITONIN: Procalcitonin: <0.1 ng/mL

## 2023-02-07 NOTE — Consult Note (Signed)
Physical Medicine and Rehabilitation Consult Reason for Consult:Post-acute rehab needs Referring Physician: Benjamine Mola   HPI: Cheryl Monroe is a 88 y.o. female with a history of Alzheimer's dementia, aphasia, recurrent UTI's and incontinence who developed choking on 02/03/23 while sitting in wheelchair at [facility].  CXR demonstrated LLL effusion and atelectasis. Pt was started on doxycycline for ?aspiration pneumonitis/pneumonia. Per family request the patient was sent to the ED for more aggressive treatment and assessment. Pt was started on rocephin and azithromycin with supportive care. Urinalysis  also +, UCX pending. Cheryl Monroe was up with therapies yesterday and was max assist for bed mobility and transfers with RW. She fatigues quickly.  Unable to ambulate at this time, LE trembling noted by PT.  Per chart pt was a short distance ambulator until ~3/24. She has had a steady decline since 2019, more so over this past year, and experienced substantial decline this past month or so.  She resides at [facility]. Daughter feels that the staff did not push her to stay active, and she has gradually become more dependent on her wheel chair.    Review of Systems  Reason unable to perform ROS: mental acuity.   Past Medical History:  Diagnosis Date   Alzheimer disease    Balance problem 07/19/2020   Constipation 07/19/2020   Dementia without behavioral disturbance 07/19/2020   MMSE 21/30 07/21/20   Fall    Osteoarthritis    Pyelonephritis    Spinal stenosis 07/19/2020   Weakness of left lower extremity 07/19/2020   History reviewed. No pertinent surgical history. Family History  Problem Relation Age of Onset   ALS Mother    Social History:  reports that she has never smoked. She has never used smokeless tobacco. She reports that she does not currently use alcohol. She reports that she does not currently use drugs. Allergies:  Allergies  Allergen Reactions   Sulfa  Antibiotics Rash    Rash to trunk, legs, neck, scalp, and arms   Medications Prior to Admission  Medication Sig Dispense Refill   Cranberry (THERACRAN PO) Take 250 mg by mouth 2 (two) times daily.     guaiFENesin (MUCINEX) 600 MG 12 hr tablet Take 600 mg by mouth 2 (two) times daily as needed for cough.     hydrALAZINE (APRESOLINE) 10 MG tablet Take 10 mg by mouth every 8 (eight) hours as needed.     ipratropium-albuterol (DUONEB) 0.5-2.5 (3) MG/3ML SOLN Take 3 mLs by nebulization in the morning and at bedtime for 3 days. 360 mL 0   nitrofurantoin (MACRODANTIN) 50 MG capsule Take 50 mg by mouth 2 (two) times daily.     polyethylene glycol (MIRALAX / GLYCOLAX) 17 g packet Take 17 g by mouth every other day.     senna (SENOKOT) 8.6 MG TABS tablet Take 2 tablets by mouth at bedtime.      Home: Home Living Family/patient expects to be discharged to:: Skilled nursing facility Additional Comments: [facility]  Functional History: Prior Function Prior Level of Function : Needs assist Mobility Comments: previously using RW for mobility, now using wheelchair for mobility now for past few months; was able to stand and transfer w/ light assist in March to/from a car but now has been using hoyer lift ADLs Comments: "they do everything" in regards to ADLs per daughter. pt is able to feed self and assist with basic tasks; reports a hx of self limiting behaviors Functional Status:  Mobility: Bed  Mobility Overal bed mobility: Needs Assistance Bed Mobility: Supine to Sit Supine to sit: Max assist, +2 for safety/equipment, +2 for physical assistance, HOB elevated General bed mobility comments: assist for trunk and LE management, scooting to/from EOB Transfers Overall transfer level: Needs assistance Equipment used: Rolling walker (2 wheels) Transfers: Bed to chair/wheelchair/BSC, Sit to/from Stand Sit to Stand: Mod assist, +2 physical assistance, +2 safety/equipment Bed to/from  chair/wheelchair/BSC transfer type:: Step pivot Step pivot transfers: Max assist, +2 physical assistance, +2 safety/equipment General transfer comment: Mod A x 2 for 3 standing trials with RW briefly, difficulty with side steps but daughter hopeful for pt to get into chair. Due to fatigue, began to require Max A x 2 for pivot to chair with assist for balance/DME mgmt and sequencing cues. Pt with LEs trembling throughout standing and stepping      ADL: ADL Overall ADL's : Needs assistance/impaired Eating/Feeding: Set up, Sitting Grooming: Minimal assistance, Sitting Upper Body Bathing: Moderate assistance, Sitting Lower Body Bathing: Maximal assistance, +2 for physical assistance, +2 for safety/equipment, Sit to/from stand Upper Body Dressing : Moderate assistance, Sitting Lower Body Dressing: Total assistance, +2 for physical assistance, +2 for safety/equipment, Sit to/from stand Toileting- Clothing Manipulation and Hygiene: Total assistance, +2 for physical assistance, +2 for safety/equipment, Sit to/from stand General ADL Comments: Limited by weakness w/ prolonged time in bed, some difficulty with clearing secretions during session (may benefit from suction device).  Cognition: Cognition Overall Cognitive Status: History of cognitive impairments - at baseline Orientation Level: Oriented to person, Disoriented to place, Disoriented to time, Disoriented to situation Cognition Arousal/Alertness: Awake/alert Behavior During Therapy: WFL for tasks assessed/performed Overall Cognitive Status: History of cognitive impairments - at baseline General Comments: hx of dementia, follows commands consistently, laughing throughout evaluation. does benefit from encouragement to participate  Blood pressure (!) 154/53, pulse 63, temperature 97.8 F (36.6 C), resp. rate 16, weight 72 kg, SpO2 97 %. Physical Exam Constitutional:      Appearance: She is obese.  HENT:     Head: Normocephalic and  atraumatic.     Right Ear: External ear normal.     Left Ear: External ear normal.     Nose: Nose normal.     Mouth/Throat:     Mouth: Mucous membranes are moist.  Eyes:     Extraocular Movements: Extraocular movements intact.  Cardiovascular:     Rate and Rhythm: Normal rate.  Pulmonary:     Effort: Pulmonary effort is normal.  Abdominal:     Palpations: Abdomen is soft.  Musculoskeletal:        General: No swelling or tenderness.     Cervical back: Normal range of motion.  Skin:    General: Skin is warm.  Neurological:     Mental Status: She is alert.     Comments: Pt is alert. Initially didn't attend to me. It seemed that after she was more comfortable with me (and with daughter also encouraging) that she answered some simple questions and followed commands. Oriented to self and "hospital". Speech fairly clear, lower volume. UE motor 4- prox to 4/5 distally. LE 2/5 HF at least 3/5 KE, KF , ADF/APF. She demonstrates delays in motor planning, motor apraxia with planned movements. Better with automatic movements.  Sensory exam normal for light touch and pain in all 4 limbs. No limb ataxia or cerebellar signs. No abnormal tone appreciated.    Psychiatric:     Comments: Pleasant, smiling. After I had been in with her for  awhile, she was much more engaging and made eye contact, etc.      Results for orders placed or performed during the hospital encounter of 02/04/23 (from the past 24 hour(s))  Culture, Respiratory w Gram Stain     Status: None (Preliminary result)   Collection Time: 02/06/23  3:40 PM   Specimen: Tracheal Aspirate; Respiratory  Result Value Ref Range   Specimen Description TRACHEAL ASPIRATE    Special Requests NONE    Gram Stain      NO WBC SEEN NO ORGANISMS SEEN Performed at [facility], [address] [place], [place] [address]    Culture PENDING    Report Status PENDING   Expectorated Sputum Assessment w Gram Stain, Rflx to Resp Cult     Status: None    Collection Time: 02/06/23  9:20 PM   Specimen: Expectorated Sputum  Result Value Ref Range   Specimen Description EXPECTORATED SPUTUM    Special Requests NONE    Sputum evaluation      THIS SPECIMEN IS ACCEPTABLE FOR SPUTUM CULTURE Performed at [facility], [address] [place], [place] [address]    Report Status 02/07/2023 FINAL   Culture, Respiratory w Gram Stain     Status: None (Preliminary result)   Collection Time: 02/06/23  9:20 PM  Result Value Ref Range   Specimen Description EXPECTORATED SPUTUM    Special Requests NONE Reflexed from [id number]    Gram Stain      FEW WBC PRESENT, PREDOMINANTLY PMN RARE GRAM POSITIVE COCCI Performed at [facility], [address] [place], [place] [address]    Culture PENDING    Report Status PENDING   CBC     Status: None   Collection Time: 02/07/23  5:04 AM  Result Value Ref Range   WBC 7.9 4.0 - 10.5 K/uL   RBC 4.64 3.87 - 5.11 MIL/uL   Hemoglobin 13.4 12.0 - 15.0 g/dL   HCT 40.3 47.4 - 25.9 %   MCV 87.1 80.0 - 100.0 fL   MCH 28.9 26.0 - 34.0 pg   MCHC 33.2 30.0 - 36.0 g/dL   RDW 56.3 87.5 - 64.3 %   Platelets 182 150 - 400 K/uL   nRBC 0.0 0.0 - 0.2 %  Basic metabolic panel     Status: Abnormal   Collection Time: 02/07/23  5:04 AM  Result Value Ref Range   Sodium 137 135 - 145 mmol/L   Potassium 3.6 3.5 - 5.1 mmol/L   Chloride 103 98 - 111 mmol/L   CO2 21 (L) 22 - 32 mmol/L   Glucose, Bld 110 (H) 70 - 99 mg/dL   BUN 13 8 - 23 mg/dL   Creatinine, Ser 3.29 0.44 - 1.00 mg/dL   Calcium 8.8 (L) 8.9 - 10.3 mg/dL   GFR, Estimated >51 >88 mL/min   Anion gap 13 5 - 15  Procalcitonin     Status: None   Collection Time: 02/07/23  6:14 AM  Result Value Ref Range   Procalcitonin <0.10 ng/mL   DG Swallowing Func-Speech Pathology  Result Date: 02/06/2023 Table formatting from the original result was not included. Modified Barium Swallow Study Patient Details Name: Anyra Kaufman MRN: [id number] Date of Birth: November 26, 1934  Today's Date: 02/06/2023 HPI/PMH: HPI: Pt is an 88 y.o. female who presented with cough. CT chest: Left lower lobe atelectasis/collapse with air bronchograms. Differential considerations include atelectasis (favored) versus pneumonia. Pt dx with pneumonia. PMH:  advanced Alzheimer's dementia,  CVA with residual mild expressive aphasia and gait imbalance, neurogenic bladder with recurrent UTI. Clinical Impression: Clinical Impression: Pt was seen in radiology suite for modified barium swallow study. Pt's oropharyngeal swallow mechanism was within functional limits, but presbyphagia was demonstrated characterized by reduced tongue base retraction, intermittent delays in swallowing initiation, and reduced anterior laryngeal excursion. Trace residue was noted in the valleculae and pyriform sinuses which was often cleared with the pt's independent use of secondary swallows. There was one instance when pt propelled a regular texture bolus to the hyopharynx and then required a thin liquid bolus to initiate the swallow. SLP suspects that this was likely secondary to pts' difficulty attending in the midst of distractions and her becoming preoccupied with towel that was on her. No instances of penetration or aspiration were demonstrated despite challenges of larger boluses and consecutive swallows of thin liquids. It is recommended that the pt's current diet of regular texture solids and thin liquids be continued at this time, but that distractions be minimized during meals. Further skilled SLP services are not clinically indicated at this time. Factors that may increase risk of adverse event in presence of aspiration [name] & [name] 2021): Factors that may increase risk of adverse event in presence of aspiration [name] & [name] 2021): Reduced cognitive function Recommendations/Plan: Swallowing Evaluation Recommendations Swallowing Evaluation Recommendations Recommendations: PO diet PO Diet Recommendation: Regular; Thin  liquids (Level 0) Liquid Administration via: Cup; Straw Medication Administration: Whole meds with liquid Supervision: Patient able to self-feed Swallowing strategies  : Minimize environmental distractions Postural changes: Position pt fully upright for meals Oral care recommendations: Oral care BID (2x/day) Treatment Plan Treatment Plan Treatment recommendations: No treatment recommended at this time Follow-up recommendations: No SLP follow up Functional status assessment: Patient has not had a recent decline in their functional status. Recommendations Recommendations for follow up therapy are one component of a multi-disciplinary discharge planning process, led by the attending physician.  Recommendations may be updated based on patient status, additional functional criteria and insurance authorization. Assessment: Orofacial Exam: Orofacial Exam Oral Cavity - Dentition: Adequate natural dentition Anatomy: Anatomy: WFL Boluses Administered: Boluses Administered Boluses Administered: Thin liquids (Level 0); Mildly thick liquids (Level 2, nectar thick); Moderately thick liquids (Level 3, honey thick); Solid; Puree  Oral Impairment Domain: Oral Impairment Domain Lip Closure: No labial escape Tongue control during bolus hold: Posterior escape of less than half of bolus Bolus preparation/mastication: Timely and efficient chewing and mashing Bolus transport/lingual motion: Slow tongue motion Oral residue: Complete oral clearance Location of oral residue : N/A Initiation of pharyngeal swallow : Valleculae; Posterior laryngeal surface of the epiglottis  Pharyngeal Impairment Domain: Pharyngeal Impairment Domain Soft palate elevation: No bolus between soft palate (SP)/pharyngeal wall (PW) Laryngeal elevation: Complete superior movement of thyroid cartilage with complete approximation of arytenoids to epiglottic petiole Anterior hyoid excursion: Partial anterior movement Epiglottic movement: Complete inversion Laryngeal  vestibule closure: Complete, no air/contrast in laryngeal vestibule Pharyngeal stripping wave : Present - complete Pharyngeal contraction (A/P view only): N/A Pharyngoesophageal segment opening: Complete distension and complete duration, no obstruction of flow Tongue base retraction: Trace column of contrast or air between tongue base and PPW Pharyngeal residue: Trace residue within or on pharyngeal structures Location of pharyngeal residue: Valleculae  Esophageal Impairment Domain: Esophageal Impairment Domain Esophageal clearance upright position: Complete clearance, esophageal coating Pill: Esophageal Impairment Domain Esophageal clearance upright position: Complete clearance, esophageal coating Penetration/Aspiration Scale Score: Penetration/Aspiration Scale Score 1.  Material does not enter airway: Thin liquids (Level 0);  Mildly thick liquids (Level 2, nectar thick); Moderately thick liquids (Level 3, honey thick); Puree; Solid; Pill Compensatory Strategies: No data recorded  General Information: Caregiver present: No  Diet Prior to this Study: Regular; Thin liquids (Level 0)   Temperature : Normal   Respiratory Status: WFL   Supplemental O2: None (Room air)   History of Recent Intubation: No  Behavior/Cognition: Alert; Cooperative; Pleasant mood; Doesn't follow directions; Requires cueing Self-Feeding Abilities: Needs set-up for self-feeding Baseline vocal quality/speech: Normal Volitional Cough: Able to elicit Volitional Swallow: Able to elicit Exam Limitations: No limitations Goal Planning: No data recorded No data recorded No data recorded Patient/Family Stated Goal: none stated Consulted and agree with results and recommendations: Patient; Family member/caregiver Pain: Pain Assessment Pain Assessment: No/denies pain End of Session: Start Time:SLP Start Time (ACUTE ONLY): 1130 Stop Time: SLP Stop Time (ACUTE ONLY): 1145 Time Calculation:SLP Time Calculation (min) (ACUTE ONLY): 15 min Charges: SLP  Evaluations $ SLP Speech Visit: 1 Visit SLP Evaluations $BSS Swallow: 1 Procedure $MBS Swallow: 1 Procedure SLP visit diagnosis: SLP Visit Diagnosis: Dysphagia, unspecified (R13.10) Past Medical History: Past Medical History: Diagnosis Date  Alzheimer disease   Balance problem 07/19/2020  Constipation 07/19/2020  Dementia without behavioral disturbance 07/19/2020  MMSE 21/30 07/21/20  Fall   Osteoarthritis   Pyelonephritis   Spinal stenosis 07/19/2020  Weakness of left lower extremity 07/19/2020 Past Surgical History: No past surgical history on file. Shanika I. [name], MS, CCC-SLP Acute Rehabilitation Services Neuro Diagnostic Specialist Office number [phone] [name] 02/06/2023, 12:35 PM   Assessment/Plan: Diagnosis: Pleasant 88 yo female with progressive Alzheimer's dementia and aphasia with associated verbal/motor planning delays, who has had a decline in her mobility over the last year or so but more dramatically over the last month per daughter. She lives at [facility] in their skilled nursing unit.   Her daughter had questions about expectations from a rehab and functional recovery standpoint. Family wants a better idea of what's realistic to expect given her diagnosis, age, and recent history.    Does the need for close, 24 hr/day medical supervision in concert with the patient's rehab needs make it unreasonable for this patient to be served in a less intensive setting?  TBD Co-Morbidities requiring supervision/potential complications:  -CAP vs URI -?UTI -urinary incontinence Due to bladder management, bowel management, safety, skin/wound care, disease management, medication administration, pain management, and patient education, does the patient require 24 hr/day rehab nursing?  TBD Does the patient require coordinated care of a physician, rehab nurse, therapy disciplines of PT, OT to address physical and functional deficits in the context of the above medical diagnosis(es)?  Potentially Addressing deficits in the following areas: balance, endurance, locomotion, strength, transferring, bowel/bladder control, bathing, dressing, feeding, grooming, toileting, and psychosocial support Can the patient actively participate in an intensive therapy program of at least 3 hrs of therapy per day at least 5 days per week?  TBD The potential for patient to make measurable gains while on inpatient rehab is fair Anticipated functional outcomes upon discharge from inpatient rehab are  TBD   with PT,  TBD  with OT, n/a with SLP. Estimated rehab length of stay to reach the above functional goals is: TBD Anticipated discharge destination: Other Overall Rehab/Functional Prognosis: fair  POST ACUTE RECOMMENDATIONS: This patient's condition is appropriate for continued rehabilitative care in the following setting:  See below Patient has agreed to participate in recommended program. N/A Note that insurance prior authorization may be required for reimbursement for  recommended care.  Comment: I spoke with daughter and patient at length today.  Her daughter is interested in inpatient rehab if we can justify it. I told her at this point, that an inpatient rehab admission would be a stretch. I would need to see consistent tolerance and participation in acute therapies before we could even consider it. She has only received acute therapy evaluations so far which doesn't give [place] any idea if there is a possibility for "progression."  I will say that Mrs. Starnes definitely opened up more and was more engaging with me after I had been in the room for awhile today.  Realistically speaking, I think she would make more sustainable gains if the family looked into a consistent therapist(s) to provide therapy for her at a SNF level at [facility]. In that way, Mrs Schweizer could gain some trust and familiarity with this person, she could develop a sustainable routine, and activity tolerance wouldn't be such an issue.     For now I recommend the following:  I will reach out to PT/OT to see if they can see her daily here on floor. Perhaps the patient can build a little trust with therapy, and we can see if the patient demonstrates any carryover or increased activity tolerance I asked NT to help her stand at least twice a day. They used the STEDY this morning which would be great for future attempts. Daughter is willing to be present and help to motivate I have ordered timed toileting q4 hours while awake to see if we can improve bladder continence.  Have encouraged daughter and staff to engage with her socially to see if this helps break the ice and put her more at ease with staff for mobility related tasks.   Rehab Admissions Coordinator to follow up    I have personally performed a face to face diagnostic evaluation of this patient. Additionally, I have examined the patient's medical record including any pertinent labs and radiographic images. If the physician assistant has documented in this note, I have reviewed and edited or otherwise concur with the physician assistant's documentation.  Thanks,  Ranelle Oyster, MD 02/07/2023   Over an hour of patient care time was spent during this visit with pt, daughter, chart review, etc. All questions were encouraged and answered.

## 2023-02-07 NOTE — Progress Notes (Signed)
Inpatient Rehabilitation Admissions Coordinator   I met with daughter at bedside as a follow up to Dr Rosalyn Charters consultation. Questions answered. We will follow up with her progress on Monday to assess demonstration of tolerance for a possible CIR admit vs return to [facility]. Upon eventual return to SNF, facility and family recommended to arrange consistent therapy to develop trust and familiarity with patient, develop a sustainable routine and activity tolerance. Please refer to Dr Rosalyn Charters consultation and recommendations.  Ottie Glazier, RN, MSN Rehab Admissions Coordinator [phone] 02/07/2023 3:16 PM

## 2023-02-07 NOTE — Progress Notes (Signed)
PROGRESS NOTE    Cheryl Monroe  WJX:[id number] DOB: [birth year] DOA: 02/04/2023 PCP: Mahlon Gammon, MD    Brief Narrative:  Cheryl Monroe is a 88 y.o. female with medical history significant of  Alzheimer's dementia with aphasia, hypertension, orthostatic hypotension,  recurrent UTIs, and urinary incontinence who presented due to plaints of cough.  History is limited from the patient and  additional information is obtained from review of records and her son over the phone. He had gone to visit her 3 days ago and noted that she had a little congestion and cough.  However, the following day she seemed to be more lethargic and less interactive than usual.  He states that usually whenever she has a infection it does make her more confused and worsens her ability to speak.  It was also reported that she had a possible choking event while coughing reported only of her own mucus.    Assessment and Plan: Community-acquired pneumonia vs viral URI (+ rhinovirus/enterovirus) Acute.  Patient had reportedly had a episode where she got choked up 2 days ago.  Since that time she had seemed to have more congestion and cough.  She has seen her primary care provider 2 days ago and was started on doxycycline.  She was also noted to have O2 saturations less than 90% for which she was started on 2 L of nasal cannula oxygen.  CT angiogram noting no pulmonary embolism and left lower lobe atelectasis/collapse with air bronchograms concerning for atelectasis versus pneumonia.   -procalcitonin negative x 3 days -NP swab: + rhinovirus - nasal cannula oxygen maintain O2 saturation greater than 92%-- this AM did not require O2 -Aspiration precautions elevation of the head of the bed -Pulmonary toiletry every 4 hours while awake- flutter/incentive spirometry -OOB -Continue empiric antibiotics of Rocephin and azithromycin for now -Scheduled albuterol nebs twice daily and as needed -Mucinex -culture pending -Speech therapy  consulted -- regular diet   Possible urinary tract infection Present on arrival.  Patient with history of recurrent urinary tract infections.  Urinalysis noted large leukocytes, few bacteria, 6-10 RBC/hpf, and greater than 50 WBCs. -Follow-up urine culture (appears to have been done after abx given) -Adequate coverage with antibiotics for PNA   Alzheimer's dementia, without behavior disturbance Expressive aphasia At baseline patient has expressive aphasia due to  Alzheimer's dementia.  No prior history of issues with swallowing previously.   Weakness/debility Patient had previously been able to ambulate, but had been mostly wheelchair-bound here lately. -PT/OT to eval and treat to see if a better plan can be formulated to get the patient ambulating again -family request PM&R-- Dr. Riley Kill to see   DVT prophylaxis: enoxaparin (LOVENOX) injection 40 mg Start: 02/05/23 1000    Code Status: DNR Family Communication: called daughter-- left message  Disposition Plan:  Level of care: Telemetry Medical Status is: Inpatient Remains inpatient appropriate because: needs further work up and PT eval    Consultants:  none   Subjective: No overnight events Patient comfortable but when asked questions say " I don't know a lot"  Objective: Vitals:   02/06/23 2042 02/07/23 0521 02/07/23 0833 02/07/23 0858  BP:  (!) 113/59 (!) 154/53   Pulse:  64 63   Resp:  16 16   Temp:  97.6 F (36.4 C) 97.8 F (36.6 C)   TempSrc:  Oral    SpO2: 96% 94% 96% 97%  Weight:        Intake/Output Summary (Last 24 hours) at 02/07/2023 1238  Last data filed at 02/07/2023 0830 Gross per 24 hour  Intake 308 ml  Output 300 ml  Net 8 ml   Filed Weights   02/04/23 2109  Weight: 72 kg    Examination:   General: Appearance:     Overweight female in no acute distress     Lungs:     Poor inspiration, not on O2, respirations unlabored  Heart:    Normal heart rate.   MS:   All extremities are intact.    Neurologic:   Awake, alert, pleasant and cooperative      Data Reviewed: I have personally reviewed following labs and imaging studies  CBC: Recent Labs  Lab 02/03/23 0000 02/04/23 2205 02/07/23 0504  WBC 6.7 6.5 7.9  NEUTROABS  --  4.0  --   HGB 15.4 12.8 13.4  HCT 47* 40.1 40.4  MCV  --  92.0 87.1  PLT  --  166 182   Basic Metabolic Panel: Recent Labs  Lab 02/03/23 0000 02/04/23 2205 02/05/23 1023 02/07/23 0504  NA 138 137 139 137  K 4.3 3.8 3.7 3.6  CL 104 101 106 103  CO2 26* 26 24 21*  GLUCOSE  --  124* 94 110*  BUN CREATININE 0.8 0.92 0.61 0.76  CALCIUM 9.2 8.5* 8.4* 8.8*   GFR: Estimated Creatinine Clearance: 47.8 mL/min (by C-G formula based on SCr of 0.76 mg/dL). Liver Function Tests: Recent Labs  Lab 02/03/23 0000 02/04/23 2205  AST  --  24  ALT  --  19  ALKPHOS 96 82  BILITOT  --  0.9  PROT  --  6.0*  ALBUMIN 3.9 2.7*   No results for input(s): "LIPASE", "AMYLASE" in the last 168 hours. No results for input(s): "AMMONIA" in the last 168 hours. Coagulation Profile: No results for input(s): "INR", "PROTIME" in the last 168 hours. Cardiac Enzymes: No results for input(s): "CKTOTAL", "CKMB", "CKMBINDEX", "TROPONINI" in the last 168 hours. BNP (last 3 results) No results for input(s): "PROBNP" in the last 8760 hours. HbA1C: No results for input(s): "HGBA1C" in the last 72 hours. CBG: No results for input(s): "GLUCAP" in the last 168 hours. Lipid Profile: No results for input(s): "CHOL", "HDL", "LDLCALC", "TRIG", "CHOLHDL", "LDLDIRECT" in the last 72 hours. Thyroid Function Tests: No results for input(s): "TSH", "T4TOTAL", "FREET4", "T3FREE", "THYROIDAB" in the last 72 hours. Anemia Panel: No results for input(s): "VITAMINB12", "FOLATE", "FERRITIN", "TIBC", "IRON", "RETICCTPCT" in the last 72 hours. Sepsis Labs: Recent Labs  Lab 02/04/23 2205 02/05/23 1023 02/07/23 0614  PROCALCITON  --  <0.10 <0.10  LATICACIDVEN 1.2  --    --     Recent Results (from the past 240 hour(s))  SARS Coronavirus 2 by RT PCR (hospital order, performed in [facility] hospital lab) *cepheid single result test* Anterior Nasal Swab     Status: None   Collection Time: 02/04/23  9:29 PM   Specimen: Anterior Nasal Swab  Result Value Ref Range Status   SARS Coronavirus 2 by RT PCR NEGATIVE NEGATIVE Final    Comment: Performed at [facility], [address], [place], [place] [address]  Culture, blood (routine x 2)     Status: None (Preliminary result)   Collection Time: 02/04/23  9:50 PM   Specimen: BLOOD  Result Value Ref Range Status   Specimen Description BLOOD SITE NOT SPECIFIED  Final   Special Requests   Final    BOTTLES DRAWN AEROBIC AND ANAEROBIC Blood  Culture results may not be optimal due to an inadequate volume of blood received in culture bottles   Culture   Final    NO GROWTH 3 DAYS Performed at [facility], [address] [place], [place] [address]    Report Status PENDING  Incomplete  Culture, blood (routine x 2)     Status: None (Preliminary result)   Collection Time: 02/04/23 10:05 PM   Specimen: BLOOD  Result Value Ref Range Status   Specimen Description BLOOD SITE NOT SPECIFIED  Final   Special Requests   Final    BOTTLES DRAWN AEROBIC AND ANAEROBIC Blood Culture adequate volume   Culture   Final    NO GROWTH 3 DAYS Performed at [facility], [address] [place], [place] [address]    Report Status PENDING  Incomplete  Respiratory (~20 pathogens) panel by PCR     Status: Abnormal   Collection Time: 02/06/23  9:36 AM   Specimen: Nasopharyngeal Swab; Respiratory  Result Value Ref Range Status   Adenovirus NOT DETECTED NOT DETECTED Final   Coronavirus 229E NOT DETECTED NOT DETECTED Final    Comment: (NOTE) The Coronavirus on the Respiratory Panel, DOES NOT test for the novel  Coronavirus (2019 nCoV)    Coronavirus HKU1 NOT DETECTED NOT DETECTED Final   Coronavirus NL63 NOT DETECTED  NOT DETECTED Final   Coronavirus OC43 NOT DETECTED NOT DETECTED Final   Metapneumovirus NOT DETECTED NOT DETECTED Final   Rhinovirus / Enterovirus DETECTED (A) NOT DETECTED Final   Influenza A NOT DETECTED NOT DETECTED Final   Influenza B NOT DETECTED NOT DETECTED Final   Parainfluenza Virus 1 NOT DETECTED NOT DETECTED Final   Parainfluenza Virus 2 NOT DETECTED NOT DETECTED Final   Parainfluenza Virus 3 NOT DETECTED NOT DETECTED Final   Parainfluenza Virus 4 NOT DETECTED NOT DETECTED Final   Respiratory Syncytial Virus NOT DETECTED NOT DETECTED Final   Bordetella pertussis NOT DETECTED NOT DETECTED Final   Bordetella Parapertussis NOT DETECTED NOT DETECTED Final   Chlamydophila pneumoniae NOT DETECTED NOT DETECTED Final   Mycoplasma pneumoniae NOT DETECTED NOT DETECTED Final    Comment: Performed at [facility], [address], [place], [place] [address]  Culture, Respiratory w Gram Stain     Status: None (Preliminary result)   Collection Time: 02/06/23  3:40 PM   Specimen: Tracheal Aspirate; Respiratory  Result Value Ref Range Status   Specimen Description TRACHEAL ASPIRATE  Final   Special Requests NONE  Final   Gram Stain NO WBC SEEN NO ORGANISMS SEEN   Final   Culture   Final    CULTURE REINCUBATED FOR BETTER GROWTH Performed at [facility], [address], [place], [place] [address]    Report Status PENDING  Incomplete  Expectorated Sputum Assessment w Gram Stain, Rflx to Resp Cult     Status: None   Collection Time: 02/06/23  9:20 PM   Specimen: Expectorated Sputum  Result Value Ref Range Status   Specimen Description EXPECTORATED SPUTUM  Final   Special Requests NONE  Final   Sputum evaluation   Final    THIS SPECIMEN IS ACCEPTABLE FOR SPUTUM CULTURE Performed at [facility], [address], [place], [place] [address]    Report Status 02/07/2023 FINAL  Final  Culture, Respiratory w Gram Stain     Status: None (Preliminary result)   Collection  Time: 02/06/23  9:20 PM  Result Value Ref Range Status  Specimen Description EXPECTORATED SPUTUM  Final   Special Requests NONE Reflexed from [id number]  Final   Gram Stain   Final    FEW WBC PRESENT, PREDOMINANTLY PMN RARE GRAM POSITIVE COCCI Performed at [facility], [address], [place], [place] [address]    Culture PENDING  Incomplete   Report Status PENDING  Incomplete         Radiology Studies: DG Swallowing Func-Speech Pathology  Result Date: 02/06/2023 Table formatting from the original result was not included. Modified Barium Swallow Study Patient Details Name: Gean Laursen MRN: [id number] Date of Birth: 03/28/1935 Today's Date: 02/06/2023 HPI/PMH: HPI: Pt is an 88 y.o. female who presented with cough. CT chest: Left lower lobe atelectasis/collapse with air bronchograms. Differential considerations include atelectasis (favored) versus pneumonia. Pt dx with pneumonia. 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 Clinical Impression: Clinical Impression: Pt was seen in radiology suite for modified barium swallow study. Pt's oropharyngeal swallow mechanism was within functional limits, but presbyphagia was demonstrated characterized by reduced tongue base retraction, intermittent delays in swallowing initiation, and reduced anterior laryngeal excursion. Trace residue was noted in the valleculae and pyriform sinuses which was often cleared with the pt's independent use of secondary swallows. There was one instance when pt propelled a regular texture bolus to the hyopharynx and then required a thin liquid bolus to initiate the swallow. SLP suspects that this was likely secondary to pts' difficulty attending in the midst of distractions and her becoming preoccupied with towel that was on her. No instances of penetration or aspiration were demonstrated despite challenges of larger boluses and  consecutive swallows of thin liquids. It is recommended that the pt's current diet of regular texture solids and thin liquids be continued at this time, but that distractions be minimized during meals. Further skilled SLP services are not clinically indicated at this time. Factors that may increase risk of adverse event in presence of aspiration [name] & [name] 2021): Factors that may increase risk of adverse event in presence of aspiration [name] & [name] 2021): Reduced cognitive function Recommendations/Plan: Swallowing Evaluation Recommendations Swallowing Evaluation Recommendations Recommendations: PO diet PO Diet Recommendation: Regular; Thin liquids (Level 0) Liquid Administration via: Cup; Straw Medication Administration: Whole meds with liquid Supervision: Patient able to self-feed Swallowing strategies  : Minimize environmental distractions Postural changes: Position pt fully upright for meals Oral care recommendations: Oral care BID (2x/day) Treatment Plan Treatment Plan Treatment recommendations: No treatment recommended at this time Follow-up recommendations: No SLP follow up Functional status assessment: Patient has not had a recent decline in their functional status. Recommendations Recommendations for follow up therapy are one component of a multi-disciplinary discharge planning process, led by the attending physician.  Recommendations may be updated based on patient status, additional functional criteria and insurance authorization. Assessment: Orofacial Exam: Orofacial Exam Oral Cavity - Dentition: Adequate natural dentition Anatomy: Anatomy: WFL Boluses Administered: Boluses Administered Boluses Administered: Thin liquids (Level 0); Mildly thick liquids (Level 2, nectar thick); Moderately thick liquids (Level 3, honey thick); Solid; Puree  Oral Impairment Domain: Oral Impairment Domain Lip Closure: No labial escape Tongue control during bolus hold: Posterior escape of less than half of bolus  Bolus preparation/mastication: Timely and efficient chewing and mashing Bolus transport/lingual motion: Slow tongue motion Oral residue: Complete oral clearance Location of oral residue : N/A Initiation of pharyngeal swallow : Valleculae; Posterior laryngeal surface of the epiglottis  Pharyngeal Impairment Domain: Pharyngeal Impairment  Domain Soft palate elevation: No bolus between soft palate (SP)/pharyngeal wall (PW) Laryngeal elevation: Complete superior movement of thyroid cartilage with complete approximation of arytenoids to epiglottic petiole Anterior hyoid excursion: Partial anterior movement Epiglottic movement: Complete inversion Laryngeal vestibule closure: Complete, no air/contrast in laryngeal vestibule Pharyngeal stripping wave : Present - complete Pharyngeal contraction (A/P view only): N/A Pharyngoesophageal segment opening: Complete distension and complete duration, no obstruction of flow Tongue base retraction: Trace column of contrast or air between tongue base and PPW Pharyngeal residue: Trace residue within or on pharyngeal structures Location of pharyngeal residue: Valleculae  Esophageal Impairment Domain: Esophageal Impairment Domain Esophageal clearance upright position: Complete clearance, esophageal coating Pill: Esophageal Impairment Domain Esophageal clearance upright position: Complete clearance, esophageal coating Penetration/Aspiration Scale Score: Penetration/Aspiration Scale Score 1.  Material does not enter airway: Thin liquids (Level 0); Mildly thick liquids (Level 2, nectar thick); Moderately thick liquids (Level 3, honey thick); Puree; Solid; Pill Compensatory Strategies: No data recorded  General Information: Caregiver present: No  Diet Prior to this Study: Regular; Thin liquids (Level 0)   Temperature : Normal   Respiratory Status: WFL   Supplemental O2: None (Room air)   History of Recent Intubation: No  Behavior/Cognition: Alert; Cooperative; Pleasant mood; Doesn't follow  directions; Requires cueing Self-Feeding Abilities: Needs set-up for self-feeding Baseline vocal quality/speech: Normal Volitional Cough: Able to elicit Volitional Swallow: Able to elicit Exam Limitations: No limitations Goal Planning: No data recorded No data recorded No data recorded Patient/Family Stated Goal: none stated Consulted and agree with results and recommendations: Patient; Family member/caregiver Pain: Pain Assessment Pain Assessment: No/denies pain End of Session: Start Time:SLP Start Time (ACUTE ONLY): 1130 Stop Time: SLP Stop Time (ACUTE ONLY): 1145 Time Calculation:SLP Time Calculation (min) (ACUTE ONLY): 15 min Charges: SLP Evaluations $ SLP Speech Visit: 1 Visit SLP Evaluations $BSS Swallow: 1 Procedure $MBS Swallow: 1 Procedure SLP visit diagnosis: SLP Visit Diagnosis: Dysphagia, unspecified (R13.10) Past Medical History: Past Medical History: Diagnosis Date  Alzheimer disease   Balance problem 07/19/2020  Constipation 07/19/2020  Dementia without behavioral disturbance 07/19/2020  MMSE 21/30 07/21/20  Fall   Osteoarthritis   Pyelonephritis   Spinal stenosis 07/19/2020  Weakness of left lower extremity 07/19/2020 Past Surgical History: No past surgical history on file. Shanika I. [name], MS, CCC-SLP Acute Rehabilitation Services Neuro Diagnostic Specialist Office number [phone] [name] 02/06/2023, 12:35 PM       Scheduled Meds:  albuterol  2.5 mg Nebulization BID   azithromycin  500 mg Oral q1800   enoxaparin (LOVENOX) injection  40 mg Subcutaneous Daily   guaiFENesin  600 mg Oral BID   hydrALAZINE  10 mg Oral Q8H   melatonin  3 mg Oral QHS   polyethylene glycol  17 g Oral QODAY   sodium chloride flush  3 mL Intravenous Q12H   Continuous Infusions:  cefTRIAXone (ROCEPHIN)  IV Stopped (02/07/23 0003)     LOS: 2 days    Time spent: 45 minutes spent on chart review, discussion with nursing staff, consultants, updating family and interview/physical exam; more than  50% of that time was spent in counseling and/or coordination of care.    Joseph Art, DO [facility] Available via Epic secure chat 7am-7pm After these hours, please refer to coverage provider listed on [email or web address] 02/07/2023, 12:38 PM

## 2023-02-07 NOTE — Progress Notes (Signed)
Physical Therapy Treatment Patient Details Name: Cheryl Monroe MRN: [id number] DOB: 10-Sep-1935 Today's Date: 02/07/2023   History of Present Illness 88 yo female presents to [facility] on 4/16 with cough, aspiration PNA. PMH includes:  Alzheimer's disease, balance deficits, OA, spinal stenosis, recurrent UTIs.    PT Comments    Pt tolerates treatment well, demonstrating good progress compared to previous session. Pt's daughter is present and a great motivator for the patient. Pt is able to stand, with continued posterior lean but does respond well to verbal and tactile cues to improve posture. Pt progresses to multiple bouts of ambulation, requiring assistance due to posterior lean and motor planning deficits. Pt will benefit from continued acute PT services in an effort to improve LE strength and standing balance as family's primary goal is to return to transfers with limited assistance. Pt demonstrates greater rehab potential today, however her cognitive deficits make retention questionable. Pt will benefit from continued frequent therapy services in an effort to see if she is able to continue to progress and retain from session to session.  Recommendations for follow up therapy are one component of a multi-disciplinary discharge planning process, led by the attending physician.  Recommendations may be updated based on patient status, additional functional criteria and insurance authorization.  Follow Up Recommendations  Can patient physically be transported by private vehicle: No    Assistance Recommended at Discharge Frequent or constant Supervision/Assistance  Patient can return home with the following Two people to help with walking and/or transfers;A lot of help with bathing/dressing/bathroom;Assistance with cooking/housework;Assist for transportation;Help with stairs or ramp for entrance;Direct supervision/assist for medications management;Direct supervision/assist for financial management    Equipment Recommendations  None recommended by PT    Recommendations for Other Services       Precautions / Restrictions Precautions Precautions: Fall Precaution Comments: expressive aphasia 2/2 dementia Restrictions Weight Bearing Restrictions: No     Mobility  Bed Mobility Overal bed mobility: Needs Assistance Bed Mobility: Sit to Supine       Sit to supine: Max assist        Transfers Overall transfer level: Needs assistance Equipment used: Rolling walker (2 wheels) Transfers: Sit to/from Stand, Bed to chair/wheelchair/BSC Sit to Stand: Mod assist, +2 physical assistance   Step pivot transfers: Max assist, +2 physical assistance       General transfer comment: posterior lean, verbal and tactile cues provided to increase trunk and hip extension    Ambulation/Gait Ambulation/Gait assistance: Max assist, +2 physical assistance Gait Distance (Feet): 15 Feet (3 additional trials of 10') Assistive device: Rolling walker (2 wheels) Gait Pattern/deviations: Step-to pattern Gait velocity: reduced Gait velocity interpretation: <1.31 ft/sec, indicative of household ambulator   General Gait Details: slowed step-to gait, increased trunk flexion. Pt with intermittent freezing of gait and benefits from facilitation of weight shift at hips or advancement of RW by PT or daughter to assist in initiating further gait training   Stairs             Wheelchair Mobility    Modified Rankin (Stroke Patients Only)       Balance Overall balance assessment: Needs assistance Sitting-balance support: No upper extremity supported, Feet supported Sitting balance-Leahy Scale: Fair     Standing balance support: Bilateral upper extremity supported, Reliant on assistive device for balance Standing balance-Leahy Scale: Poor Standing balance comment: modA, posterior lean  Cognition Arousal/Alertness: Awake/alert Behavior During  Therapy: WFL for tasks assessed/performed Overall Cognitive Status: History of cognitive impairments - at baseline                                 General Comments: history of dementia with associated expressive aphasia. Pt follows commands with increased time, motor apraxia noted.        Exercises      General Comments General comments (skin integrity, edema, etc.): daughter present and a great motivator for the patient. VSS      Pertinent Vitals/Pain Pain Assessment Pain Assessment: PAINAD Breathing: normal Negative Vocalization: none Facial Expression: smiling or inexpressive Body Language: relaxed Consolability: no need to console PAINAD Score: 0    Home Living                          Prior Function            PT Goals (current goals can now be found in the care plan section) Acute Rehab PT Goals Patient Stated Goal: get pt transfer-level Progress towards PT goals: Progressing toward goals    Frequency    Min 5X/week (trial of increased PT to see if pt can make progress, become an AIR candidate)      PT Plan Current plan remains appropriate    Co-evaluation              AM-PAC PT "6 Clicks" Mobility   Outcome Measure  Help needed turning from your back to your side while in a flat bed without using bedrails?: A Lot Help needed moving from lying on your back to sitting on the side of a flat bed without using bedrails?: A Lot Help needed moving to and from a bed to a chair (including a wheelchair)?: A Lot Help needed standing up from a chair using your arms (e.g., wheelchair or bedside chair)?: A Lot Help needed to walk in hospital room?: Total Help needed climbing 3-5 steps with a railing? : Total 6 Click Score: 10    End of Session Equipment Utilized During Treatment: Gait belt Activity Tolerance: Patient tolerated treatment well Patient left: in bed;with call bell/phone within reach;with bed alarm set;with  family/visitor present Nurse Communication: Mobility status;Need for lift equipment (STEDY for transfers) PT Visit Diagnosis: Other abnormalities of gait and mobility (R26.89);Muscle weakness (generalized) (M62.81)     Time: 1610-9604 PT Time Calculation (min) (ACUTE ONLY): 43 min  Charges:  $Gait Training: 23-37 mins $Therapeutic Activity: 8-22 mins                     Arlyss Gandy, PT, DPT Acute Rehabilitation Office [phone]    Arlyss Gandy 02/07/2023, 5:18 PM

## 2023-02-07 NOTE — Plan of Care (Signed)
Pt refusing antibiotic pill at this time, RN attempted multiple times to give pt her medication but pt getting agitated and refusing, RN later came back and attempted again but pt refusing.

## 2023-02-08 DIAGNOSIS — J690 Pneumonitis due to inhalation of food and vomit: Secondary | ICD-10-CM | POA: Diagnosis not present

## 2023-02-08 DIAGNOSIS — B348 Other viral infections of unspecified site: Secondary | ICD-10-CM

## 2023-02-08 LAB — BASIC METABOLIC PANEL
Anion gap: 7 (ref 5–15)
BUN: 13 mg/dL (ref 8–23)
CO2: 25 mmol/L (ref 22–32)
Calcium: 8.6 mg/dL — ABNORMAL LOW (ref 8.9–10.3)
Chloride: 104 mmol/L (ref 98–111)
Creatinine, Ser: 0.72 mg/dL (ref 0.44–1.00)
GFR, Estimated: >60 mL/min (ref >60)
Glucose, Bld: 116 mg/dL — ABNORMAL HIGH (ref 70–99)
Potassium: 3.3 mmol/L — ABNORMAL LOW (ref 3.5–5.1)
Sodium: 136 mmol/L (ref 135–145)

## 2023-02-08 LAB — URINE CULTURE: Culture: 80000 — AB

## 2023-02-08 LAB — CBC
HCT: 36.6 % (ref 36.0–46.0)
Hemoglobin: 12.2 g/dL (ref 12.0–15.0)
MCH: 29.3 pg (ref 26.0–34.0)
MCHC: 33.3 g/dL (ref 30.0–36.0)
MCV: 87.8 fL (ref 80.0–100.0)
Platelets: 192 10*3/uL (ref 150–400)
RBC: 4.17 MIL/uL (ref 3.87–5.11)
RDW: 12.6 % (ref 11.5–15.5)
WBC: 6.8 10*3/uL (ref 4.0–10.5)
nRBC: 0 % (ref 0.0–0.2)

## 2023-02-08 LAB — GLUCOSE, CAPILLARY: Glucose-Capillary: 157 mg/dL — ABNORMAL HIGH (ref 70–99)

## 2023-02-08 LAB — CULTURE, RESPIRATORY W GRAM STAIN

## 2023-02-08 MED ORDER — POTASSIUM CHLORIDE CRYS ER 20 MEQ PO TBCR
40.0000 meq | EXTENDED_RELEASE_TABLET | Freq: Once | ORAL | Status: AC
  Administered 2023-02-08: 40 meq via ORAL
  Filled 2023-02-08: qty 2

## 2023-02-08 NOTE — Progress Notes (Signed)
Physical Therapy Treatment Patient Details Name: Cheryl Monroe MRN: [id number] DOB: 02-03-1935 Today's Date: 02/08/2023   History of Present Illness 88 yo female presents to [facility] on 4/16 with cough, aspiration PNA. PMH includes:  Alzheimer's disease, balance deficits, OA, spinal stenosis, recurrent UTIs.    PT Comments    Daughter saw therapist and reports pt much better than earlier and politely requested another session.  Reports improvement since washed up, ate, and in chair.  Daughter present this session.  Pt continued to require mod A of 2 to stand but on 3rd attempt with some improvement and able to progress to taking a few steps with mod A of 2 and max facilitation.   Will continue current plan of increased frequency to see if pt has ability to progress.    Recommendations for follow up therapy are one component of a multi-disciplinary discharge planning process, led by the attending physician.  Recommendations may be updated based on patient status, additional functional criteria and insurance authorization.  Follow Up Recommendations  Can patient physically be transported by private vehicle: No    Assistance Recommended at Discharge Frequent or constant Supervision/Assistance  Patient can return home with the following Assistance with cooking/housework;Assist for transportation;Help with stairs or ramp for entrance;Direct supervision/assist for medications management;Direct supervision/assist for financial management;Two people to help with walking and/or transfers;Two people to help with bathing/dressing/bathroom   Equipment Recommendations  None recommended by PT    Recommendations for Other Services       Precautions / Restrictions Precautions Precautions: Fall Precaution Comments: expressive aphasia 2/2 dementia     Mobility  Bed Mobility Overal bed mobility: Needs Assistance Bed Mobility: Supine to Sit, Sit to Supine     Supine to sit: +2 for physical assistance,  Max assist Sit to supine: +2 for physical assistance, Max assist   General bed mobility comments: pt in chair    Transfers Overall transfer level: Needs assistance Equipment used: Rolling walker (2 wheels) Transfers: Sit to/from Stand Sit to Stand: Mod assist, +2 physical assistance           General transfer comment: Pt with posterior lean in sitting.  Worked on reaching forward to targets and RW prior to standing. Able to get pt balanced with feet on floor and not leaning posteriorly with these task.  Then worked on standing x 3 with mod A x 2. Pt requiring multimodal cues and facilitation to lean forward to stand with assist to position feet..  Pt with tendency to posterior lean, cues and facilitation to tuck bottom and chest up. Some progress on 3rd attempt and able to progress to some steps.  Daughter present and assisting with cues and physical assist    Ambulation/Gait Ambulation/Gait assistance: Mod assist, +2 physical assistance Gait Distance (Feet): 10 Feet Assistive device: Rolling walker (2 wheels) Gait Pattern/deviations: Step-to pattern, Decreased stride length       General Gait Details: Pt with slow step to gait requiring verbal cues for sequencing and assist to weight shift for sequencing.  Required assist at buttock for standing, assist with RW, and chair follow   Stairs             Wheelchair Mobility    Modified Rankin (Stroke Patients Only)       Balance Overall balance assessment: Needs assistance Sitting-balance support: No upper extremity supported, Feet supported Sitting balance-Leahy Scale: Fair Sitting balance - Comments: Initial posterior lean with min/mod A.  Worked on reaching forward to targets  Standing balance support: Bilateral upper extremity supported, Reliant on assistive device for balance Standing balance-Leahy Scale: Poor Standing balance comment: Mod A; posterior lean; 2 reps at ~30 sec then 3rd rep >1 min with walking                             Cognition Arousal/Alertness: Awake/alert Behavior During Therapy: WFL for tasks assessed/performed Overall Cognitive Status: History of cognitive impairments - at baseline                                 General Comments: history of dementia with associated expressive aphasia. Pt follows commands with increased time, motor apraxia noted.        Exercises      General Comments General comments (skin integrity, edema, etc.): daughter present      Pertinent Vitals/Pain Pain Assessment Pain Assessment: No/denies pain Faces Pain Scale: Hurts a little bit Pain Location: R leg with movement - noted crepitus in knee Pain Descriptors / Indicators: Discomfort, Grimacing Pain Intervention(s): Limited activity within patient's tolerance, Monitored during session    Home Living                          Prior Function            PT Goals (current goals can now be found in the care plan section) Progress towards PT goals: Progressing toward goals    Frequency    Min 5X/week (trial of increased PT to see if pt can make progress, become an AIR candidate)      PT Plan Current plan remains appropriate    Co-evaluation              AM-PAC PT "6 Clicks" Mobility   Outcome Measure  Help needed turning from your back to your side while in a flat bed without using bedrails?: A Lot Help needed moving from lying on your back to sitting on the side of a flat bed without using bedrails?: Total Help needed moving to and from a bed to a chair (including a wheelchair)?: Total Help needed standing up from a chair using your arms (e.g., wheelchair or bedside chair)?: Total Help needed to walk in hospital room?: Total Help needed climbing 3-5 steps with a railing? : Total 6 Click Score: 7    End of Session Equipment Utilized During Treatment: Gait belt Activity Tolerance: Patient tolerated treatment well Patient left:  with call bell/phone within reach;with family/visitor present;with chair alarm set;in chair Nurse Communication: Mobility status;Need for lift equipment PT Visit Diagnosis: Other abnormalities of gait and mobility (R26.89);Muscle weakness (generalized) (M62.81)     Time: 1610-9604 PT Time Calculation (min) (ACUTE ONLY): 29 min  Charges:  $Gait Training: 8-22 mins $Therapeutic Activity: 8-22 mins $Neuromuscular Re-education: 8-22 mins                     Anise Salvo, PT Acute Rehab [facility] [phone]    Cheryl Monroe 02/08/2023, 1:10 PM

## 2023-02-08 NOTE — Progress Notes (Signed)
Physical Therapy Treatment Patient Details Name: Cheryl Monroe MRN: [id number] DOB: [birth year] Today's Date: 02/08/2023   History of Present Illness 88 yo female presents to [facility] on 4/16 with cough, aspiration PNA. PMH includes:  Alzheimer's disease, balance deficits, OA, spinal stenosis, recurrent UTIs.    PT Comments    Pt with limited progress.  She did participate with increased time, note that pt did well with family present last visit.  No family present today.  Pt was mod/max A of 2 for all transfers.  Some progress with EOB balance with reaching task but still strong posterior lean in standing.  Unable to progress ambulation today. Continue plan of care.    Recommendations for follow up therapy are one component of a multi-disciplinary discharge planning process, led by the attending physician.  Recommendations may be updated based on patient status, additional functional criteria and insurance authorization.  Follow Up Recommendations  Can patient physically be transported by private vehicle: No    Assistance Recommended at Discharge Frequent or constant Supervision/Assistance  Patient can return home with the following Assistance with cooking/housework;Assist for transportation;Help with stairs or ramp for entrance;Direct supervision/assist for medications management;Direct supervision/assist for financial management;Two people to help with walking and/or transfers;Two people to help with bathing/dressing/bathroom   Equipment Recommendations  None recommended by PT    Recommendations for Other Services       Precautions / Restrictions Precautions Precautions: Fall Precaution Comments: expressive aphasia 2/2 dementia     Mobility  Bed Mobility Overal bed mobility: Needs Assistance Bed Mobility: Supine to Sit, Sit to Supine     Supine to sit: +2 for physical assistance, Max assist Sit to supine: +2 for physical assistance, Max assist   General bed mobility comments:  Increased time with cues for sequencing.  Pt minimally assisting with legs with tactile cues to initiate.    Transfers Overall transfer level: Needs assistance Equipment used: 2 person hand held assist (held back of recliner) Transfers: Sit to/from Stand Sit to Stand: Mod assist, +2 physical assistance           General transfer comment: Pt with posterior lean in sitting.  Worked on reaching forward to targets and armrest of chair prior to standing. Able to get pt balanced with feet on floor and not leaning posteriorly with these task.  Then worked on standing x 4 with mod A x 2. Pt requiring multimodal cues and facilitation to lean forward to stand.  Tried holding onto back of recliner and holding 2 person HHA.  Pt with tendency to posterior lean, unable to "tuck buttom", and relies on pushing back on bed with knees to stand.  Tried cues and faciliation for improved posture but limited progress.  Unable to progress to steps or pivots to chair.  Could benefit from [facility] but was missing from floor (nusing looking for it).    Ambulation/Gait                   Stairs             Wheelchair Mobility    Modified Rankin (Stroke Patients Only)       Balance Overall balance assessment: Needs assistance Sitting-balance support: No upper extremity supported, Feet supported Sitting balance-Leahy Scale: Fair Sitting balance - Comments: Initial posterior lean with min/mod A.  Worked on reaching forward to targets and leaning onto armrest of chair for 3 mins.  After this pt able to progress to hands on lap with supervision for safety.  With any challenges would lean posteriorly.  Sat EOB 10-15 mins during session   Standing balance support: Bilateral upper extremity supported, Reliant on assistive device for balance Standing balance-Leahy Scale: Poor Standing balance comment: modA, posterior lean; only tolerating 20-30 sec at a time w/ c/o knee pain                             Cognition Arousal/Alertness: Awake/alert Behavior During Therapy: WFL for tasks assessed/performed Overall Cognitive Status: History of cognitive impairments - at baseline                                 General Comments: history of dementia with associated expressive aphasia. Pt follows commands with increased time, motor apraxia noted.        Exercises      General Comments General comments (skin integrity, edema, etc.): no family present this session      Pertinent Vitals/Pain Pain Assessment Pain Assessment: Faces Faces Pain Scale: Hurts a little bit Pain Location: R leg with movement - noted crepitus in knee Pain Descriptors / Indicators: Discomfort, Grimacing Pain Intervention(s): Limited activity within patient's tolerance, Monitored during session    Home Living                          Prior Function            PT Goals (current goals can now be found in the care plan section) Progress towards PT goals: Progressing toward goals (limited)    Frequency    Min 5X/week (trial of increased PT to see if pt can make progress, become an AIR candidate)      PT Plan Current plan remains appropriate    Co-evaluation              AM-PAC PT "6 Clicks" Mobility   Outcome Measure  Help needed turning from your back to your side while in a flat bed without using bedrails?: A Lot Help needed moving from lying on your back to sitting on the side of a flat bed without using bedrails?: Total Help needed moving to and from a bed to a chair (including a wheelchair)?: Total Help needed standing up from a chair using your arms (e.g., wheelchair or bedside chair)?: Total Help needed to walk in hospital room?: Total Help needed climbing 3-5 steps with a railing? : Total 6 Click Score: 7    End of Session Equipment Utilized During Treatment: Gait belt Activity Tolerance: Patient tolerated treatment well Patient left: in bed;with call  bell/phone within reach;with bed alarm set;with family/visitor present (returned to bed as no STEDY available) Nurse Communication: Mobility status;Need for lift equipment PT Visit Diagnosis: Other abnormalities of gait and mobility (R26.89);Muscle weakness (generalized) (M62.81)     Time: 1610-9604 PT Time Calculation (min) (ACUTE ONLY): 25 min  Charges:  $Therapeutic Activity: 8-22 mins $Neuromuscular Re-education: 8-22 mins                     Anise Salvo, PT Acute Rehab [facility] [phone]    Rayetta Humphrey 02/08/2023, 10:43 AM

## 2023-02-08 NOTE — Progress Notes (Signed)
PROGRESS NOTE    Cheryl Monroe  WUJ:[id number] DOB: [birth year] DOA: 02/04/2023 PCP: Mahlon Gammon, MD    Brief Narrative:  Cheryl Monroe is a 88 y.o. female with medical history significant of  Alzheimer's dementia with aphasia, hypertension, orthostatic hypotension,  recurrent UTIs, and urinary incontinence who presented due to plaints of cough.  History is limited from the patient and  additional information is obtained from review of records and her son over the phone. He had gone to visit her 3 days ago and noted that she had a little congestion and cough.  However, the following day she seemed to be more lethargic and less interactive than usual.  He states that usually whenever she has a infection it does make her more confused and worsens her ability to speak.  It was also reported that she had a possible choking event while coughing reported only of her own mucus.    Assessment and Plan: Community-acquired pneumonia vs viral URI (+ rhinovirus/enterovirus) Acute.  Patient had reportedly had a episode where she got choked up 2 days ago.  Since that time she had seemed to have more congestion and cough.  She has seen her primary care provider 2 days ago and was started on doxycycline.  She was also noted to have O2 saturations less than 90% for which she was started on 2 L of nasal cannula oxygen.  CT angiogram noting no pulmonary embolism and left lower lobe atelectasis/collapse with air bronchograms concerning for atelectasis versus pneumonia.   -procalcitonin negative x 3 days -NP swab: + rhinovirus -off O2 -Aspiration precautions elevation of the head of the bed -Pulmonary toiletry every 4 hours while awake- flutter/incentive spirometry -OOB -Continue empiric antibiotics of Rocephin and azithromycin for now -Scheduled albuterol nebs twice daily and as needed -Mucinex -resp. culture pending -Speech therapy consulted -- regular diet   Hypokalemia -replete  Possible urinary tract  infection Present on arrival.  Patient with history of recurrent urinary tract infections.  Urinalysis noted large leukocytes, few bacteria, 6-10 RBC/hpf, and greater than 50 WBCs. -Follow-up urine culture (appears to have been done after abx given) -Adequate coverage with antibiotics for PNA   Alzheimer's dementia, without behavior disturbance Expressive aphasia At baseline patient has expressive aphasia due to  Alzheimer's dementia.  No prior history of issues with swallowing previously.   Weakness/debility Patient had previously been able to ambulate, but had been mostly wheelchair-bound here lately. -PT/OT to eval and treat to see if a better plan can be formulated to get the patient ambulating again -family request PM&R-- Dr. Riley Kill to see   DVT prophylaxis: enoxaparin (LOVENOX) injection 40 mg Start: 02/05/23 1000    Code Status: DNR Family Communication: spoke with daughter  Disposition Plan:  Level of care: Telemetry Medical Status is: Inpatient Remains inpatient appropriate because: needs further work up and PT eval-- may be CIR candidate    Consultants:  PM&R   Subjective: No current complaints-- strong cough  Objective: Vitals:   02/07/23 2130 02/08/23 0426 02/08/23 0818 02/08/23 0929  BP:  (!) 132/50 (!) 145/54   Pulse:  64 67 66  Resp:  15  16  Temp:  98.3 F (36.8 C) 98.4 F (36.9 C)   TempSrc:  Oral Oral   SpO2: 100% 97% 94% 95%  Weight:        Intake/Output Summary (Last 24 hours) at 02/08/2023 1044 Last data filed at 02/07/2023 1230 Gross per 24 hour  Intake 120 ml  Output --  Net 120 ml   Filed Weights   02/04/23 2109  Weight: 72 kg    Examination:   General: Appearance:     Overweight female in no acute distress     Lungs:     Poor inspiration, not on O2, respirations unlabored  Heart:    Normal heart rate.   MS:   All extremities are intact.   Neurologic:   Awake, alert, pleasant and cooperative      Data Reviewed: I have  personally reviewed following labs and imaging studies  CBC: Recent Labs  Lab 02/03/23 0000 02/04/23 2205 02/07/23 0504 02/08/23 0423  WBC 6.7 6.5 7.9 6.8  NEUTROABS  --  4.0  --   --   HGB 15.4 12.8 13.4 12.2  HCT 47* 40.1 40.4 36.6  MCV  --  92.0 87.1 87.8  PLT  --  166 182 192   Basic Metabolic Panel: Recent Labs  Lab 02/03/23 0000 02/04/23 2205 02/05/23 1023 02/07/23 0504 02/08/23 0423  NA 138 137 139 137 136  K 4.3 3.8 3.7 3.6 3.3*  CL 104 101 106 103 104  CO2 26* 26 24 21* 25  GLUCOSE  --  124* 94 110* 116*  BUN CREATININE 0.8 0.92 0.61 0.76 0.72  CALCIUM 9.2 8.5* 8.4* 8.8* 8.6*   GFR: Estimated Creatinine Clearance: 47.8 mL/min (by C-G formula based on SCr of 0.72 mg/dL). Liver Function Tests: Recent Labs  Lab 02/03/23 0000 02/04/23 2205  AST  --  24  ALT  --  19  ALKPHOS 96 82  BILITOT  --  0.9  PROT  --  6.0*  ALBUMIN 3.9 2.7*   No results for input(s): "LIPASE", "AMYLASE" in the last 168 hours. No results for input(s): "AMMONIA" in the last 168 hours. Coagulation Profile: No results for input(s): "INR", "PROTIME" in the last 168 hours. Cardiac Enzymes: No results for input(s): "CKTOTAL", "CKMB", "CKMBINDEX", "TROPONINI" in the last 168 hours. BNP (last 3 results) No results for input(s): "PROBNP" in the last 8760 hours. HbA1C: No results for input(s): "HGBA1C" in the last 72 hours. CBG: No results for input(s): "GLUCAP" in the last 168 hours. Lipid Profile: No results for input(s): "CHOL", "HDL", "LDLCALC", "TRIG", "CHOLHDL", "LDLDIRECT" in the last 72 hours. Thyroid Function Tests: No results for input(s): "TSH", "T4TOTAL", "FREET4", "T3FREE", "THYROIDAB" in the last 72 hours. Anemia Panel: No results for input(s): "VITAMINB12", "FOLATE", "FERRITIN", "TIBC", "IRON", "RETICCTPCT" in the last 72 hours. Sepsis Labs: Recent Labs  Lab 02/04/23 2205 02/05/23 1023 02/07/23 0614  PROCALCITON  --  <0.10 <0.10  LATICACIDVEN 1.2   --   --     Recent Results (from the past 240 hour(s))  SARS Coronavirus 2 by RT PCR (hospital order, performed in [facility] hospital lab) *cepheid single result test* Anterior Nasal Swab     Status: None   Collection Time: 02/04/23  9:29 PM   Specimen: Anterior Nasal Swab  Result Value Ref Range Status   SARS Coronavirus 2 by RT PCR NEGATIVE NEGATIVE Final    Comment: Performed at [facility], [address], [place], [place] [address]  Culture, blood (routine x 2)     Status: None (Preliminary result)   Collection Time: 02/04/23  9:50 PM   Specimen: BLOOD  Result Value Ref Range Status   Specimen Description BLOOD SITE NOT SPECIFIED  Final   Special Requests   Final    BOTTLES DRAWN AEROBIC AND ANAEROBIC  Blood Culture results may not be optimal due to an inadequate volume of blood received in culture bottles   Culture   Final    NO GROWTH 4 DAYS Performed at [facility], [address] [place], [place] [address]    Report Status PENDING  Incomplete  Culture, blood (routine x 2)     Status: None (Preliminary result)   Collection Time: 02/04/23 10:05 PM   Specimen: BLOOD  Result Value Ref Range Status   Specimen Description BLOOD SITE NOT SPECIFIED  Final   Special Requests   Final    BOTTLES DRAWN AEROBIC AND ANAEROBIC Blood Culture adequate volume   Culture   Final    NO GROWTH 4 DAYS Performed at [facility], [address], [place], [place] [address]    Report Status PENDING  Incomplete  Respiratory (~20 pathogens) panel by PCR     Status: Abnormal   Collection Time: 02/06/23  9:36 AM   Specimen: Nasopharyngeal Swab; Respiratory  Result Value Ref Range Status   Adenovirus NOT DETECTED NOT DETECTED Final   Coronavirus 229E NOT DETECTED NOT DETECTED Final    Comment: (NOTE) The Coronavirus on the Respiratory Panel, DOES NOT test for the novel  Coronavirus (2019 nCoV)    Coronavirus HKU1 NOT DETECTED NOT DETECTED Final   Coronavirus NL63 NOT  DETECTED NOT DETECTED Final   Coronavirus OC43 NOT DETECTED NOT DETECTED Final   Metapneumovirus NOT DETECTED NOT DETECTED Final   Rhinovirus / Enterovirus DETECTED (A) NOT DETECTED Final   Influenza A NOT DETECTED NOT DETECTED Final   Influenza B NOT DETECTED NOT DETECTED Final   Parainfluenza Virus 1 NOT DETECTED NOT DETECTED Final   Parainfluenza Virus 2 NOT DETECTED NOT DETECTED Final   Parainfluenza Virus 3 NOT DETECTED NOT DETECTED Final   Parainfluenza Virus 4 NOT DETECTED NOT DETECTED Final   Respiratory Syncytial Virus NOT DETECTED NOT DETECTED Final   Bordetella pertussis NOT DETECTED NOT DETECTED Final   Bordetella Parapertussis NOT DETECTED NOT DETECTED Final   Chlamydophila pneumoniae NOT DETECTED NOT DETECTED Final   Mycoplasma pneumoniae NOT DETECTED NOT DETECTED Final    Comment: Performed at [facility], [address] [place], [place] [address]  Culture, Respiratory w Gram Stain     Status: None (Preliminary result)   Collection Time: 02/06/23  3:40 PM   Specimen: Tracheal Aspirate; Respiratory  Result Value Ref Range Status   Specimen Description TRACHEAL ASPIRATE  Final   Special Requests NONE  Final   Gram Stain NO WBC SEEN NO ORGANISMS SEEN   Final   Culture   Final    CULTURE REINCUBATED FOR BETTER GROWTH Performed at [facility], [address] [place], [place] [address]    Report Status PENDING  Incomplete  Expectorated Sputum Assessment w Gram Stain, Rflx to Resp Cult     Status: None   Collection Time: 02/06/23  9:20 PM   Specimen: Expectorated Sputum  Result Value Ref Range Status   Specimen Description EXPECTORATED SPUTUM  Final   Special Requests NONE  Final   Sputum evaluation   Final    THIS SPECIMEN IS ACCEPTABLE FOR SPUTUM CULTURE Performed at [facility], [address], [place], [place] [address]    Report Status 02/07/2023 FINAL  Final  Culture, Respiratory w Gram Stain     Status: None (Preliminary result)    Collection Time: 02/06/23  9:20 PM  Result Value Ref Range  Status   Specimen Description EXPECTORATED SPUTUM  Final   Special Requests NONE Reflexed from [id number]  Final   Gram Stain   Final    FEW WBC PRESENT, PREDOMINANTLY PMN RARE GRAM POSITIVE COCCI Performed at [facility], [address], [place], [place] [address]    Culture PENDING  Incomplete   Report Status PENDING  Incomplete  Urine Culture (for pregnant, neutropenic or urologic patients or patients with an indwelling urinary catheter)     Status: None (Preliminary result)   Collection Time: 02/07/23  2:00 AM   Specimen: Urine, Clean Catch  Result Value Ref Range Status   Specimen Description URINE, CLEAN CATCH  Final   Special Requests NONE  Final   Culture   Final    CULTURE REINCUBATED FOR BETTER GROWTH Performed at [facility], [address] [place], [place] [address]    Report Status PENDING  Incomplete         Radiology Studies: DG Swallowing Func-Speech Pathology  Result Date: 02/06/2023 Table formatting from the original result was not included. Modified Barium Swallow Study Patient Details Name: Cheryl Monroe MRN: [id number] Date of Birth: [birth year] Today's Date: 02/06/2023 HPI/PMH: HPI: Pt is an 88 y.o. female who presented with cough. CT chest: Left lower lobe atelectasis/collapse with air bronchograms. Differential considerations include atelectasis (favored) versus pneumonia. Pt dx with pneumonia. 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 Clinical Impression: Clinical Impression: Pt was seen in radiology suite for modified barium swallow study. Pt's oropharyngeal swallow mechanism was within functional limits, but presbyphagia was demonstrated characterized by reduced tongue base retraction, intermittent delays in swallowing initiation, and reduced anterior laryngeal excursion. Trace residue was noted in  the valleculae and pyriform sinuses which was often cleared with the pt's independent use of secondary swallows. There was one instance when pt propelled a regular texture bolus to the hyopharynx and then required a thin liquid bolus to initiate the swallow. SLP suspects that this was likely secondary to pts' difficulty attending in the midst of distractions and her becoming preoccupied with towel that was on her. No instances of penetration or aspiration were demonstrated despite challenges of larger boluses and consecutive swallows of thin liquids. It is recommended that the pt's current diet of regular texture solids and thin liquids be continued at this time, but that distractions be minimized during meals. Further skilled SLP services are not clinically indicated at this time. Factors that may increase risk of adverse event in presence of aspiration [name] & [name] 2021): Factors that may increase risk of adverse event in presence of aspiration [name] & [name] 2021): Reduced cognitive function Recommendations/Plan: Swallowing Evaluation Recommendations Swallowing Evaluation Recommendations Recommendations: PO diet PO Diet Recommendation: Regular; Thin liquids (Level 0) Liquid Administration via: Cup; Straw Medication Administration: Whole meds with liquid Supervision: Patient able to self-feed Swallowing strategies  : Minimize environmental distractions Postural changes: Position pt fully upright for meals Oral care recommendations: Oral care BID (2x/day) Treatment Plan Treatment Plan Treatment recommendations: No treatment recommended at this time Follow-up recommendations: No SLP follow up Functional status assessment: Patient has not had a recent decline in their functional status. Recommendations Recommendations for follow up therapy are one component of a multi-disciplinary discharge planning process, led by the attending physician.  Recommendations may be updated based on patient status, additional  functional criteria and insurance authorization. Assessment: Orofacial Exam: Orofacial Exam Oral Cavity - Dentition: Adequate natural  dentition Anatomy: Anatomy: WFL Boluses Administered: Boluses Administered Boluses Administered: Thin liquids (Level 0); Mildly thick liquids (Level 2, nectar thick); Moderately thick liquids (Level 3, honey thick); Solid; Puree  Oral Impairment Domain: Oral Impairment Domain Lip Closure: No labial escape Tongue control during bolus hold: Posterior escape of less than half of bolus Bolus preparation/mastication: Timely and efficient chewing and mashing Bolus transport/lingual motion: Slow tongue motion Oral residue: Complete oral clearance Location of oral residue : N/A Initiation of pharyngeal swallow : Valleculae; Posterior laryngeal surface of the epiglottis  Pharyngeal Impairment Domain: Pharyngeal Impairment Domain Soft palate elevation: No bolus between soft palate (SP)/pharyngeal wall (PW) Laryngeal elevation: Complete superior movement of thyroid cartilage with complete approximation of arytenoids to epiglottic petiole Anterior hyoid excursion: Partial anterior movement Epiglottic movement: Complete inversion Laryngeal vestibule closure: Complete, no air/contrast in laryngeal vestibule Pharyngeal stripping wave : Present - complete Pharyngeal contraction (A/P view only): N/A Pharyngoesophageal segment opening: Complete distension and complete duration, no obstruction of flow Tongue base retraction: Trace column of contrast or air between tongue base and PPW Pharyngeal residue: Trace residue within or on pharyngeal structures Location of pharyngeal residue: Valleculae  Esophageal Impairment Domain: Esophageal Impairment Domain Esophageal clearance upright position: Complete clearance, esophageal coating Pill: Esophageal Impairment Domain Esophageal clearance upright position: Complete clearance, esophageal coating Penetration/Aspiration Scale Score: Penetration/Aspiration Scale  Score 1.  Material does not enter airway: Thin liquids (Level 0); Mildly thick liquids (Level 2, nectar thick); Moderately thick liquids (Level 3, honey thick); Puree; Solid; Pill Compensatory Strategies: No data recorded  General Information: Caregiver present: No  Diet Prior to this Study: Regular; Thin liquids (Level 0)   Temperature : Normal   Respiratory Status: WFL   Supplemental O2: None (Room air)   History of Recent Intubation: No  Behavior/Cognition: Alert; Cooperative; Pleasant mood; Doesn't follow directions; Requires cueing Self-Feeding Abilities: Needs set-up for self-feeding Baseline vocal quality/speech: Normal Volitional Cough: Able to elicit Volitional Swallow: Able to elicit Exam Limitations: No limitations Goal Planning: No data recorded No data recorded No data recorded Patient/Family Stated Goal: none stated Consulted and agree with results and recommendations: Patient; Family member/caregiver Pain: Pain Assessment Pain Assessment: No/denies pain End of Session: Start Time:SLP Start Time (ACUTE ONLY): 1130 Stop Time: SLP Stop Time (ACUTE ONLY): 1145 Time Calculation:SLP Time Calculation (min) (ACUTE ONLY): 15 min Charges: SLP Evaluations $ SLP Speech Visit: 1 Visit SLP Evaluations $BSS Swallow: 1 Procedure $MBS Swallow: 1 Procedure SLP visit diagnosis: SLP Visit Diagnosis: Dysphagia, unspecified (R13.10) Past Medical History: Past Medical History: Diagnosis Date  Alzheimer disease   Balance problem 07/19/2020  Constipation 07/19/2020  Dementia without behavioral disturbance 07/19/2020  MMSE 21/30 07/21/20  Fall   Osteoarthritis   Pyelonephritis   Spinal stenosis 07/19/2020  Weakness of left lower extremity 07/19/2020 Past Surgical History: No past surgical history on file. Shanika I. [name], MS, CCC-SLP [facility]ist Office number [phone] [name] 02/06/2023, 12:35 PM       Scheduled Meds:  albuterol  2.5 mg Nebulization BID    azithromycin  500 mg Oral q1800   enoxaparin (LOVENOX) injection  40 mg Subcutaneous Daily   guaiFENesin  600 mg Oral BID   hydrALAZINE  10 mg Oral Q8H   melatonin  3 mg Oral QHS   polyethylene glycol  17 g Oral QODAY   sodium chloride flush  3 mL Intravenous Q12H   Continuous Infusions:  cefTRIAXone (ROCEPHIN)  IV 2 g (02/07/23 2127)  LOS: 3 days    Time spent: 45 minutes spent on chart review, discussion with nursing staff, consultants, updating family and interview/physical exam; more than 50% of that time was spent in counseling and/or coordination of care.    Joseph Art, DO [facility] Available via Epic secure chat 7am-7pm After these hours, please refer to coverage provider listed on [email or web address] 02/08/2023, 10:44 AM

## 2023-02-09 DIAGNOSIS — N390 Urinary tract infection, site not specified: Secondary | ICD-10-CM | POA: Diagnosis not present

## 2023-02-09 DIAGNOSIS — R531 Weakness: Secondary | ICD-10-CM | POA: Diagnosis not present

## 2023-02-09 LAB — CULTURE, RESPIRATORY W GRAM STAIN: Culture: NORMAL

## 2023-02-09 LAB — CULTURE, BLOOD (ROUTINE X 2)
Culture: NO GROWTH
Culture: NO GROWTH
Special Requests: ADEQUATE

## 2023-02-09 MED ORDER — HYDRALAZINE HCL 25 MG PO TABS
25.0000 mg | ORAL_TABLET | Freq: Three times a day (TID) | ORAL | Status: DC
  Administered 2023-02-09 – 2023-02-11 (×8): 25 mg via ORAL
  Filled 2023-02-09 (×8): qty 1

## 2023-02-09 MED ORDER — FLUCONAZOLE IN SODIUM CHLORIDE 200-0.9 MG/100ML-% IV SOLN
200.0000 mg | INTRAVENOUS | Status: DC
  Administered 2023-02-09: 200 mg via INTRAVENOUS
  Filled 2023-02-09 (×2): qty 100

## 2023-02-09 MED ORDER — AMLODIPINE BESYLATE 5 MG PO TABS
2.5000 mg | ORAL_TABLET | Freq: Every day | ORAL | Status: DC
  Administered 2023-02-09: 2.5 mg via ORAL
  Filled 2023-02-09: qty 1

## 2023-02-09 NOTE — Progress Notes (Addendum)
PROGRESS NOTE    Cheryl Monroe  ZOX:[id number] DOB: 1935-09-03 DOA: 02/04/2023 PCP: Mahlon Gammon, MD    Brief Narrative:  Cheryl Monroe is a 88 y.o. female with medical history significant of  Alzheimer's dementia with aphasia, hypertension, orthostatic hypotension,  recurrent UTIs, and urinary incontinence who presented due to plaints of cough.  History is limited from the patient and  additional information is obtained from review of records and her son over the phone. He had gone to visit her 3 days ago and noted that she had a little congestion and cough.  However, the following day she seemed to be more lethargic and less interactive than usual.  He states that usually whenever she has a infection it does make her more confused and worsens her ability to speak.  It was also reported that she had a possible choking event while coughing reported only of her own mucus.     Assessment and Plan: Community-acquired pneumonia vs viral URI (+ rhinovirus/enterovirus) Acute.  Patient had reportedly had a episode where she got choked up 2 days ago.  Since that time she had seemed to have more congestion and cough.  She has seen her primary care provider 2 days ago and was started on doxycycline.  She was also noted to have O2 saturations less than 90% for which she was started on 2 L of nasal cannula oxygen.  CT angiogram noting no pulmonary embolism and left lower lobe atelectasis/collapse with air bronchograms concerning for atelectasis versus pneumonia.   -procalcitonin negative x 3 days -NP swab: + rhinovirus -off O2 -Aspiration precautions elevation of the head of the bed -Pulmonary toiletry every 4 hours while awake- flutter/incentive spirometry -OOB -Continue empiric antibiotics of Rocephin and azithromycin for now -Scheduled albuterol nebs twice daily and as needed -Mucinex -resp. culture pending -Speech therapy consulted -- regular diet   Hypokalemia -replete -recheck in  AM   HTN -on hydralazine-- increased dose 4/21  Possible urinary tract infection Present on arrival.  Patient with history of recurrent urinary tract infections.  Urinalysis noted large leukocytes, few bacteria, 6-10 RBC/hpf, and greater than 50 WBCs. -Follow-up urine culture (appears to have been done after abx given)- culture shows yeast-- added fluconazole -Adequate coverage with antibiotics for PNA x 5 days    Alzheimer's dementia, without behavior disturbance Expressive aphasia At baseline patient has expressive aphasia due to  Alzheimer's dementia.  No prior history of issues with swallowing previously.   Weakness/debility Patient had previously been able to ambulate, but had been mostly wheelchair-bound here lately. -PT/OT to eval and treat to see if a better plan can be formulated to get the patient ambulating again -family request PM&R-- Dr. Riley Kill following   DVT prophylaxis: enoxaparin (LOVENOX) injection 40 mg Start: 02/05/23 1000    Code Status: DNR Family Communication: spoke with daughter 4/20  Disposition Plan:  Level of care: Telemetry Medical Status is: Inpatient Remains inpatient appropriate because: needs further work up and PT eval-- may be CIR candidate    Consultants:  PM&R   Subjective: No overnight events-- worked with PT 2x yesterday  Objective: Vitals:   02/08/23 2038 02/09/23 0406 02/09/23 0814 02/09/23 0842  BP: (!) 158/79 (!) 167/67 (!) 174/76   Pulse: 75 72 72   Resp: 15 15 18    Temp: 98.4 F (36.9 C) 98.2 F (36.8 C) 98.1 F (36.7 C)   TempSrc: Oral Oral Oral   SpO2: 97% 96% 96% 97%  Weight:  No intake or output data in the 24 hours ending 02/09/23 1111  Filed Weights   02/04/23 2109  Weight: 72 kg    Examination:   General: Appearance:     Overweight female in no acute distress     Lungs:     Diminished, not on O2  Heart:    Normal heart rate.   MS:   All extremities are intact.   Neurologic:   Awake, alert,  pleasant and cooperative      Data Reviewed: I have personally reviewed following labs and imaging studies  CBC: Recent Labs  Lab 02/03/23 0000 02/04/23 2205 02/07/23 0504 02/08/23 0423  WBC 6.7 6.5 7.9 6.8  NEUTROABS  --  4.0  --   --   HGB 15.4 12.8 13.4 12.2  HCT 47* 40.1 40.4 36.6  MCV  --  92.0 87.1 87.8  PLT  --  166 182 192   Basic Metabolic Panel: Recent Labs  Lab 02/03/23 0000 02/04/23 2205 02/05/23 1023 02/07/23 0504 02/08/23 0423  NA 138 137 139 137 136  K 4.3 3.8 3.7 3.6 3.3*  CL 104 101 106 103 104  CO2 26* 26 24 21* 25  GLUCOSE  --  124* 94 110* 116*  BUN 14 21 13 13 13   CREATININE 0.8 0.92 0.61 0.76 0.72  CALCIUM 9.2 8.5* 8.4* 8.8* 8.6*   GFR: Estimated Creatinine Clearance: 47.8 mL/min (by C-G formula based on SCr of 0.72 mg/dL). Liver Function Tests: Recent Labs  Lab 02/03/23 0000 02/04/23 2205  AST  --  24  ALT  --  19  ALKPHOS 96 82  BILITOT  --  0.9  PROT  --  6.0*  ALBUMIN 3.9 2.7*   No results for input(s): "LIPASE", "AMYLASE" in the last 168 hours. No results for input(s): "AMMONIA" in the last 168 hours. Coagulation Profile: No results for input(s): "INR", "PROTIME" in the last 168 hours. Cardiac Enzymes: No results for input(s): "CKTOTAL", "CKMB", "CKMBINDEX", "TROPONINI" in the last 168 hours. BNP (last 3 results) No results for input(s): "PROBNP" in the last 8760 hours. HbA1C: No results for input(s): "HGBA1C" in the last 72 hours. CBG: Recent Labs  Lab 02/08/23 1143  GLUCAP 157*   Lipid Profile: No results for input(s): "CHOL", "HDL", "LDLCALC", "TRIG", "CHOLHDL", "LDLDIRECT" in the last 72 hours. Thyroid Function Tests: No results for input(s): "TSH", "T4TOTAL", "FREET4", "T3FREE", "THYROIDAB" in the last 72 hours. Anemia Panel: No results for input(s): "VITAMINB12", "FOLATE", "FERRITIN", "TIBC", "IRON", "RETICCTPCT" in the last 72 hours. Sepsis Labs: Recent Labs  Lab 02/04/23 2205 02/05/23 1023 02/07/23 0614   PROCALCITON  --  <0.10 <0.10  LATICACIDVEN 1.2  --   --     Recent Results (from the past 240 hour(s))  SARS Coronavirus 2 by RT PCR (hospital order, performed in [facility] hospital lab) *cepheid single result test* Anterior Nasal Swab     Status: None   Collection Time: 02/04/23  9:29 PM   Specimen: Anterior Nasal Swab  Result Value Ref Range Status   SARS Coronavirus 2 by RT PCR NEGATIVE NEGATIVE Final    Comment: Performed at [facility] Lab, [address], [place], [place] [address]  Culture, blood (routine x 2)     Status: None   Collection Time: 02/04/23  9:50 PM   Specimen: BLOOD  Result Value Ref Range Status   Specimen Description BLOOD SITE NOT SPECIFIED  Final   Special Requests   Final    BOTTLES  DRAWN AEROBIC AND ANAEROBIC Blood Culture results may not be optimal due to an inadequate volume of blood received in culture bottles   Culture   Final    NO GROWTH 5 DAYS Performed at [facility], [address], [place], [place] [address]    Report Status 02/09/2023 FINAL  Final  Culture, blood (routine x 2)     Status: None   Collection Time: 02/04/23 10:05 PM   Specimen: BLOOD  Result Value Ref Range Status   Specimen Description BLOOD SITE NOT SPECIFIED  Final   Special Requests   Final    BOTTLES DRAWN AEROBIC AND ANAEROBIC Blood Culture adequate volume   Culture   Final    NO GROWTH 5 DAYS Performed at [facility], [address], [place], [place] [address]    Report Status 02/09/2023 FINAL  Final  Respiratory (~20 pathogens) panel by PCR     Status: Abnormal   Collection Time: 02/06/23  9:36 AM   Specimen: Nasopharyngeal Swab; Respiratory  Result Value Ref Range Status   Adenovirus NOT DETECTED NOT DETECTED Final   Coronavirus 229E NOT DETECTED NOT DETECTED Final    Comment: (NOTE) The Coronavirus on the Respiratory Panel, DOES NOT test for the novel  Coronavirus (2019 nCoV)    Coronavirus HKU1 NOT DETECTED NOT DETECTED Final    Coronavirus NL63 NOT DETECTED NOT DETECTED Final   Coronavirus OC43 NOT DETECTED NOT DETECTED Final   Metapneumovirus NOT DETECTED NOT DETECTED Final   Rhinovirus / Enterovirus DETECTED (A) NOT DETECTED Final   Influenza A NOT DETECTED NOT DETECTED Final   Influenza B NOT DETECTED NOT DETECTED Final   Parainfluenza Virus 1 NOT DETECTED NOT DETECTED Final   Parainfluenza Virus 2 NOT DETECTED NOT DETECTED Final   Parainfluenza Virus 3 NOT DETECTED NOT DETECTED Final   Parainfluenza Virus 4 NOT DETECTED NOT DETECTED Final   Respiratory Syncytial Virus NOT DETECTED NOT DETECTED Final   Bordetella pertussis NOT DETECTED NOT DETECTED Final   Bordetella Parapertussis NOT DETECTED NOT DETECTED Final   Chlamydophila pneumoniae NOT DETECTED NOT DETECTED Final   Mycoplasma pneumoniae NOT DETECTED NOT DETECTED Final    Comment: Performed at [facility], [address], [place], [place] [address]  Culture, Respiratory w Gram Stain     Status: None   Collection Time: 02/06/23  3:40 PM   Specimen: Tracheal Aspirate; Respiratory  Result Value Ref Range Status   Specimen Description TRACHEAL ASPIRATE  Final   Special Requests NONE  Final   Gram Stain NO WBC SEEN NO ORGANISMS SEEN   Final   Culture   Final    RARE Normal respiratory flora-no Staph aureus or Pseudomonas seen Performed at [facility], [address] [place], [place] [address]    Report Status 02/09/2023 FINAL  Final  Expectorated Sputum Assessment w Gram Stain, Rflx to Resp Cult     Status: None   Collection Time: 02/06/23  9:20 PM   Specimen: Expectorated Sputum  Result Value Ref Range Status   Specimen Description EXPECTORATED SPUTUM  Final   Special Requests NONE  Final   Sputum evaluation   Final    THIS SPECIMEN IS ACCEPTABLE FOR SPUTUM CULTURE Performed at [facility], [address], [place], [place] [address]    Report Status 02/07/2023 FINAL  Final  Culture, Respiratory w Gram Stain     Status:  None   Collection Time: 02/06/23  9:20 PM  Result Value Ref Range Status   Specimen Description EXPECTORATED SPUTUM  Final   Special Requests NONE Reflexed from [id number]  Final   Gram Stain   Final    FEW WBC PRESENT, PREDOMINANTLY PMN RARE GRAM POSITIVE COCCI    Culture   Final    RARE Normal respiratory flora-no Staph aureus or Pseudomonas seen Performed at [facility], [address], [place], [place] [address]    Report Status 02/09/2023 FINAL  Final  Urine Culture (for pregnant, neutropenic or urologic patients or patients with an indwelling urinary catheter)     Status: Abnormal   Collection Time: 02/07/23  2:00 AM   Specimen: Urine, Clean Catch  Result Value Ref Range Status   Specimen Description URINE, CLEAN CATCH  Final   Special Requests   Final    NONE Performed at [facility], [address] [place], [place] [address]    Culture 80,000 COLONIES/mL YEAST (A)  Final   Report Status 02/08/2023 FINAL  Final         Radiology Studies: No results found.      Scheduled Meds:  albuterol  2.5 mg Nebulization BID   enoxaparin (LOVENOX) injection  40 mg Subcutaneous Daily   guaiFENesin  600 mg Oral BID   hydrALAZINE  25 mg Oral Q8H   melatonin  3 mg Oral QHS   polyethylene glycol  17 g Oral QODAY   sodium chloride flush  3 mL Intravenous Q12H   Continuous Infusions:  cefTRIAXone (ROCEPHIN)  IV 2 g (02/08/23 2156)   fluconazole (DIFLUCAN) IV       LOS: 4 days    Time spent: 45 minutes spent on chart review, discussion with nursing staff, consultants, updating family and interview/physical exam; more than 50% of that time was spent in counseling and/or coordination of care.    Joseph Art, DO [facility] Available via Epic secure chat 7am-7pm After these hours, please refer to coverage provider listed on [email or web address] 02/09/2023, 11:11 AM

## 2023-02-09 NOTE — Progress Notes (Signed)
Pharmacy Antibiotic Note  Cheryl Monroe is a 88 y.o. female admitted on 02/04/2023 with UTI.  Pharmacy has been consulted for Fluconazole dosing.  Patient presents with dementia limiting symptom assessment. Will treat with a short course per MD. She remains afebrile, without hypotension (BP elevated), and with a normal WBC. Her UA from 4/17 was positive for large leukocytes, trace blood, and WBC clumps. She does not have a foley catheter.    Plan: Start Fluconazole  IV q24 hours x 3 days  Follow signs and symptoms of infection and transition to oral pending clinical progression    Weight: 72 kg (158 lb 11.7 oz)  Temp (24hrs), Avg:98 F (36.7 C), Min:97.3 F (36.3 C), Max:98.4 F (36.9 C)  Recent Labs  Lab 02/03/23 0000 02/04/23 2205 02/05/23 1023 02/07/23 0504 02/08/23 0423  WBC 6.7 6.5  --  7.9 6.8  CREATININE 0.8 0.92 0.61 0.76 0.72  LATICACIDVEN  --  1.2  --   --   --     Estimated Creatinine Clearance: 47.8 mL/min (by C-G formula based on SCr of 0.72 mg/dL).    Allergies  Allergen Reactions   Sulfa Antibiotics Rash    Rash to trunk, legs, neck, scalp, and arms    Antimicrobials this admission: Ceftriaxone 4/17 >> (4/22) Azithromycin 4/18 >> 4/20  Dose adjustments this admission: N/A  Microbiology results: 4/16 BCx: NG x 5 days; final  4/18 Respiratory Panel: + influenza A  4/19 UCx: 80,000 colonies of yeast    Thank you for allowing pharmacy to be a part of this patient's care.  Blane Ohara, PharmD  PGY1 Pharmacy Resident

## 2023-02-10 DIAGNOSIS — J189 Pneumonia, unspecified organism: Secondary | ICD-10-CM | POA: Diagnosis not present

## 2023-02-10 LAB — CBC
HCT: 39 % (ref 36.0–46.0)
Hemoglobin: 12.4 g/dL (ref 12.0–15.0)
MCH: 28.6 pg (ref 26.0–34.0)
MCHC: 31.8 g/dL (ref 30.0–36.0)
MCV: 90.1 fL (ref 80.0–100.0)
Platelets: 215 10*3/uL (ref 150–400)
RBC: 4.33 MIL/uL (ref 3.87–5.11)
RDW: 12.4 % (ref 11.5–15.5)
WBC: 7.7 10*3/uL (ref 4.0–10.5)
nRBC: 0 % (ref 0.0–0.2)

## 2023-02-10 LAB — GLUCOSE, CAPILLARY: Glucose-Capillary: 143 mg/dL — ABNORMAL HIGH (ref 70–99)

## 2023-02-10 LAB — BASIC METABOLIC PANEL
Anion gap: 12 (ref 5–15)
BUN: 6 mg/dL — ABNORMAL LOW (ref 8–23)
CO2: 25 mmol/L (ref 22–32)
Calcium: 8.7 mg/dL — ABNORMAL LOW (ref 8.9–10.3)
Chloride: 101 mmol/L (ref 98–111)
Creatinine, Ser: 0.66 mg/dL (ref 0.44–1.00)
GFR, Estimated: >60 mL/min (ref >60)
Glucose, Bld: 98 mg/dL (ref 70–99)
Potassium: 3.7 mmol/L (ref 3.5–5.1)
Sodium: 138 mmol/L (ref 135–145)

## 2023-02-10 MED ORDER — AMLODIPINE BESYLATE 5 MG PO TABS
5.0000 mg | ORAL_TABLET | Freq: Every day | ORAL | Status: DC
  Administered 2023-02-10: 5 mg via ORAL
  Filled 2023-02-10: qty 1

## 2023-02-10 MED ORDER — FLUCONAZOLE 200 MG PO TABS
200.0000 mg | ORAL_TABLET | ORAL | Status: AC
  Administered 2023-02-10 – 2023-02-11 (×2): 200 mg via ORAL
  Filled 2023-02-10 (×2): qty 1

## 2023-02-10 NOTE — Progress Notes (Signed)
Inpatient Rehab Admissions Coordinator:   Following for my colleague, Ottie Glazier.  Will let her and Dr. Riley Kill f/u tomorrow for review of candidacy.  We do not have any beds available on CIR today.   Estill Dooms, PT, DPT Admissions Coordinator [phone] 02/10/23  9:46 AM

## 2023-02-10 NOTE — Progress Notes (Signed)
Physical Therapy Treatment Patient Details Name: Cheryl Monroe MRN: [id number] DOB: 02/08/1935 Today's Date: 02/10/2023   History of Present Illness 88 yo female presents to [facility] on 4/16 with cough, aspiration PNA. PMH includes:  Alzheimer's disease, balance deficits, OA, spinal stenosis, recurrent UTIs.    PT Comments    Pt with slow improvement.  Slight improvement in standing balance/posture today.  Session focused on standing balance, quality, and endurance.  Attempted pre gait but pt fatigued.  Provided multimodal cues including visual in mirror for standing.  Continue to progress as able.     Recommendations for follow up therapy are one component of a multi-disciplinary discharge planning process, led by the attending physician.  Recommendations may be updated based on patient status, additional functional criteria and insurance authorization.  Follow Up Recommendations  Can patient physically be transported by private vehicle: No    Assistance Recommended at Discharge Frequent or constant Supervision/Assistance  Patient can return home with the following Assistance with cooking/housework;Assist for transportation;Help with stairs or ramp for entrance;Direct supervision/assist for medications management;Direct supervision/assist for financial management;Two people to help with walking and/or transfers;Two people to help with bathing/dressing/bathroom   Equipment Recommendations  None recommended by PT    Recommendations for Other Services       Precautions / Restrictions Precautions Precautions: Fall Precaution Comments: expressive aphasia 2/2 dementia     Mobility  Bed Mobility               General bed mobility comments: Sitting EOB with nursing at arrival    Transfers Overall transfer level: Needs assistance Equipment used: Ambulation equipment used Transfers: Sit to/from Stand, Bed to chair/wheelchair/BSC Sit to Stand: Mod assist           General  transfer comment: Sit to stand x 1 from bed, x 2 from [facility], and x8 from STEDY.  Pt pulling up on STEDY bar requiring mod A but still with knees flexed and difficulty tucking bottom.   Frequent cues and faciliation to lean forward to initiate then for tuck bottom , knees straight, and chest up. Daughter present and assisting as needed. Transfer via Lift Equipment: Stedy  Ambulation/Gait             Pre-gait activities: Attempted weight shifting or lifting feet in STEDY but pt reports too fatigued     Stairs             Wheelchair Mobility    Modified Rankin (Stroke Patients Only)       Balance Overall balance assessment: Needs assistance Sitting-balance support: No upper extremity supported, Feet supported Sitting balance-Leahy Scale: Fair Sitting balance - Comments: improved without posterior lean today   Standing balance support: Bilateral upper extremity supported, Reliant on assistive device for balance Standing balance-Leahy Scale: Poor Standing balance comment: Mod A to maintain balance in STEDY but then additional person to perform ADLs.  Stood at least 8 x in [place].  Most from 5 to 10 sec but 2 bouts of 1-1.5 mins for ADLs.  Stood for toileting ADLs and also to spit in sink when brushing teeth.  Provided verbal as well as tactile facilitation to tuck bottom and lift shoulders.  After toileting , placed STEDY in front of mirror to additionally provide visual cues for upright                            Cognition Arousal/Alertness: Awake/alert Behavior During Therapy: [facility] for tasks assessed/performed  Overall Cognitive Status: History of cognitive impairments - at baseline                                 General Comments: history of dementia with associated expressive aphasia. Pt follows commands with increased time, motor apraxia noted.        Exercises General Exercises - Lower Extremity Short Arc Quad: AROM, Both, 10 reps  (reclined) Long Arc Quad: AROM, Both, 10 reps, Seated Hip Flexion/Marching: AROM, Both, 10 reps, Seated Other Exercises Other Exercises: Required cues for slow full ROM with visiual targets for all. SAQ to focus on terminal knee ext.    General Comments        Pertinent Vitals/Pain Pain Assessment Pain Assessment: No/denies pain    Home Living                          Prior Function            PT Goals (current goals can now be found in the care plan section) Progress towards PT goals: Progressing toward goals    Frequency    Min 5X/week (trial of increased PT to see if pt can make progress, become an AIR candidate)      PT Plan Current plan remains appropriate    Co-evaluation              AM-PAC PT "6 Clicks" Mobility   Outcome Measure  Help needed turning from your back to your side while in a flat bed without using bedrails?: A Lot Help needed moving from lying on your back to sitting on the side of a flat bed without using bedrails?: Total Help needed moving to and from a bed to a chair (including a wheelchair)?: Total Help needed standing up from a chair using your arms (e.g., wheelchair or bedside chair)?: Total Help needed to walk in hospital room?: Total Help needed climbing 3-5 steps with a railing? : Total 6 Click Score: 7    End of Session Equipment Utilized During Treatment: Gait belt Activity Tolerance: Patient tolerated treatment well Patient left: with call bell/phone within reach;with family/visitor present;with chair alarm set;in chair Nurse Communication: Mobility status;Need for lift equipment PT Visit Diagnosis: Other abnormalities of gait and mobility (R26.89);Muscle weakness (generalized) (M62.81)     Time: 1610-9604 PT Time Calculation (min) (ACUTE ONLY): 44 min  Charges:  $Therapeutic Activity: 23-37 mins $Neuromuscular Re-education: 8-22 mins                     Anise Salvo, PT Acute Rehab [facility]  [phone]    Rayetta Humphrey 02/10/2023, 1:36 PM

## 2023-02-10 NOTE — Progress Notes (Signed)
PROGRESS NOTE    Cheryl Monroe  ZOX:[id number] DOB: 07-Dec-1934 DOA: 02/04/2023 PCP: Mahlon Gammon, MD    Brief Narrative:  Cheryl Monroe is a 88 y.o. female with medical history significant of  Alzheimer's dementia with aphasia, hypertension, orthostatic hypotension,  recurrent UTIs, and urinary incontinence who presented due to plaints of cough.  History is limited from the patient and  additional information is obtained from review of records and her son over the phone. He had gone to visit her 3 days ago and noted that she had a little congestion and cough.  However, the following day she seemed to be more lethargic and less interactive than usual.  He states that usually whenever she has a infection it does make her more confused and worsens her ability to speak.  It was also reported that she had a possible choking event while coughing reported only of her own mucus.     Assessment and Plan: Community-acquired pneumonia vs viral URI (+ rhinovirus/enterovirus) Acute.  Patient had reportedly had a episode where she got choked up 2 days ago.  Since that time she had seemed to have more congestion and cough.  She has seen her primary care provider 2 days ago and was started on doxycycline.  She was also noted to have O2 saturations less than 90% for which she was started on 2 L of nasal cannula oxygen.  CT angiogram noting no pulmonary embolism and left lower lobe atelectasis/collapse with air bronchograms concerning for atelectasis versus pneumonia.   -procalcitonin negative x 3 days -NP swab: + rhinovirus -off O2 -Aspiration precautions elevation of the head of the bed -Pulmonary toiletry every 4 hours while awake- flutter/incentive spirometry -OOB -Continue empiric antibiotics of Rocephin and azithromycin for now -Scheduled albuterol nebs twice daily and as needed -Mucinex -resp. culture normal flora -Speech therapy consulted -- regular diet   Hypokalemia -repleted  HTN -on  hydralazine-- increased dose 4/21 -added norvasc with increase -do not want to over-treat  Possible urinary tract infection Present on arrival.  Patient with history of recurrent urinary tract infections.  Urinalysis noted large leukocytes, few bacteria, 6-10 RBC/hpf, and greater than 50 WBCs. -Follow-up urine culture (appears to have been done after abx given)- culture shows yeast-- added fluconazole -Adequate coverage with antibiotics for PNA x 5 days   Alzheimer's dementia, without behavior disturbance Expressive aphasia At baseline patient has expressive aphasia due to  Alzheimer's dementia.  No prior history of issues with swallowing previously.   Weakness/debility Patient had previously been able to ambulate, but had been mostly wheelchair-bound here lately. -PT/OT to eval and treat to see if a better plan can be formulated to get the patient ambulating again -family request PM&R-- Dr. Riley Kill following   DVT prophylaxis: enoxaparin (LOVENOX) injection 40 mg Start: 02/05/23 1000    Code Status: DNR Family Communication: spoke with daughter 4/22  Disposition Plan:  Level of care: Telemetry Medical Status is: Inpatient Remains inpatient appropriate because: needs further work up and PT eval-- may be CIR candidate    Consultants:  PM&R   Subjective: Pleasant with no complaints  Objective: Vitals:   02/09/23 2134 02/10/23 0441 02/10/23 0758 02/10/23 0808  BP: (!) 157/64 (!) 174/78 (!) 180/72   Pulse:  61 67 72  Resp:  Temp:  (!) 97.5 F (36.4 C) 98 F (36.7 C)   TempSrc:   Oral   SpO2:  97% 97% 96%  Weight:  Intake/Output Summary (Last 24 hours) at 02/10/2023 1144 Last data filed at 02/10/2023 1610 Gross per 24 hour  Intake 220 ml  Output --  Net 220 ml    Filed Weights   02/04/23 2109  Weight: 72 kg    Examination:   General: Appearance:     Overweight female in no acute distress     Lungs:     Diminished, not on O2  Heart:     Normal heart rate.   MS:   All extremities are intact.   Neurologic:   Awake, alert, pleasant and cooperative      Data Reviewed: I have personally reviewed following labs and imaging studies  CBC: Recent Labs  Lab 02/04/23 2205 02/07/23 0504 02/08/23 0423 02/10/23 0348  WBC 6.5 7.9 6.8 7.7  NEUTROABS 4.0  --   --   --   HGB 12.8 13.4 12.2 12.4  HCT 40.1 40.4 36.6 39.0  MCV 92.0 87.1 87.8 90.1  PLT 166 182 192 215   Basic Metabolic Panel: Recent Labs  Lab 02/04/23 2205 02/05/23 1023 02/07/23 0504 02/08/23 0423 02/10/23 0348  NA 137 139 137 136 138  K 3.8 3.7 3.6 3.3* 3.7  CL 101 106 103 104 101  CO2 26 24 21* 25 25  GLUCOSE 124* 94 110* 116* 98  BUN 21 13 13 13  6*  CREATININE 0.92 0.61 0.76 0.72 0.66  CALCIUM 8.5* 8.4* 8.8* 8.6* 8.7*   GFR: Estimated Creatinine Clearance: 47.8 mL/min (by C-G formula based on SCr of 0.66 mg/dL). Liver Function Tests: Recent Labs  Lab 02/04/23 2205  AST 24  ALT 19  ALKPHOS 82  BILITOT 0.9  PROT 6.0*  ALBUMIN 2.7*   No results for input(s): "LIPASE", "AMYLASE" in the last 168 hours. No results for input(s): "AMMONIA" in the last 168 hours. Coagulation Profile: No results for input(s): "INR", "PROTIME" in the last 168 hours. Cardiac Enzymes: No results for input(s): "CKTOTAL", "CKMB", "CKMBINDEX", "TROPONINI" in the last 168 hours. BNP (last 3 results) No results for input(s): "PROBNP" in the last 8760 hours. HbA1C: No results for input(s): "HGBA1C" in the last 72 hours. CBG: Recent Labs  Lab 02/08/23 1143  GLUCAP 157*   Lipid Profile: No results for input(s): "CHOL", "HDL", "LDLCALC", "TRIG", "CHOLHDL", "LDLDIRECT" in the last 72 hours. Thyroid Function Tests: No results for input(s): "TSH", "T4TOTAL", "FREET4", "T3FREE", "THYROIDAB" in the last 72 hours. Anemia Panel: No results for input(s): "VITAMINB12", "FOLATE", "FERRITIN", "TIBC", "IRON", "RETICCTPCT" in the last 72 hours. Sepsis Labs: Recent Labs  Lab  02/04/23 2205 02/05/23 1023 02/07/23 0614  PROCALCITON  --  <0.10 <0.10  LATICACIDVEN 1.2  --   --     Recent Results (from the past 240 hour(s))  SARS Coronavirus 2 by RT PCR (hospital order, performed in [facility] hospital lab) *cepheid single result test* Anterior Nasal Swab     Status: None   Collection Time: 02/04/23  9:29 PM   Specimen: Anterior Nasal Swab  Result Value Ref Range Status   SARS Coronavirus 2 by RT PCR NEGATIVE NEGATIVE Final    Comment: Performed at [facility], [address], [place], [place] [address]  Culture, blood (routine x 2)     Status: None   Collection Time: 02/04/23  9:50 PM   Specimen: BLOOD  Result Value Ref Range Status   Specimen Description BLOOD SITE NOT SPECIFIED  Final   Special Requests   Final    BOTTLES DRAWN AEROBIC AND  ANAEROBIC Blood Culture results may not be optimal due to an inadequate volume of blood received in culture bottles   Culture   Final    NO GROWTH 5 DAYS Performed at [facility], [address] [place], [place] [address]    Report Status 02/09/2023 FINAL  Final  Culture, blood (routine x 2)     Status: None   Collection Time: 02/04/23 10:05 PM   Specimen: BLOOD  Result Value Ref Range Status   Specimen Description BLOOD SITE NOT SPECIFIED  Final   Special Requests   Final    BOTTLES DRAWN AEROBIC AND ANAEROBIC Blood Culture adequate volume   Culture   Final    NO GROWTH 5 DAYS Performed at [facility], [address], [place], [place] [address]    Report Status 02/09/2023 FINAL  Final  Respiratory (~20 pathogens) panel by PCR     Status: Abnormal   Collection Time: 02/06/23  9:36 AM   Specimen: Nasopharyngeal Swab; Respiratory  Result Value Ref Range Status   Adenovirus NOT DETECTED NOT DETECTED Final   Coronavirus 229E NOT DETECTED NOT DETECTED Final    Comment: (NOTE) The Coronavirus on the Respiratory Panel, DOES NOT test for the novel  Coronavirus (2019 nCoV)    Coronavirus HKU1  NOT DETECTED NOT DETECTED Final   Coronavirus NL63 NOT DETECTED NOT DETECTED Final   Coronavirus OC43 NOT DETECTED NOT DETECTED Final   Metapneumovirus NOT DETECTED NOT DETECTED Final   Rhinovirus / Enterovirus DETECTED (A) NOT DETECTED Final   Influenza A NOT DETECTED NOT DETECTED Final   Influenza B NOT DETECTED NOT DETECTED Final   Parainfluenza Virus 1 NOT DETECTED NOT DETECTED Final   Parainfluenza Virus 2 NOT DETECTED NOT DETECTED Final   Parainfluenza Virus 3 NOT DETECTED NOT DETECTED Final   Parainfluenza Virus 4 NOT DETECTED NOT DETECTED Final   Respiratory Syncytial Virus NOT DETECTED NOT DETECTED Final   Bordetella pertussis NOT DETECTED NOT DETECTED Final   Bordetella Parapertussis NOT DETECTED NOT DETECTED Final   Chlamydophila pneumoniae NOT DETECTED NOT DETECTED Final   Mycoplasma pneumoniae NOT DETECTED NOT DETECTED Final    Comment: Performed at [facility], [address], [place], [place] [address]  Culture, Respiratory w Gram Stain     Status: None   Collection Time: 02/06/23  3:40 PM   Specimen: Tracheal Aspirate; Respiratory  Result Value Ref Range Status   Specimen Description TRACHEAL ASPIRATE  Final   Special Requests NONE  Final   Gram Stain NO WBC SEEN NO ORGANISMS SEEN   Final   Culture   Final    RARE Normal respiratory flora-no Staph aureus or Pseudomonas seen Performed at [facility], [address], [place], [place] [address]    Report Status 02/09/2023 FINAL  Final  Expectorated Sputum Assessment w Gram Stain, Rflx to Resp Cult     Status: None   Collection Time: 02/06/23  9:20 PM   Specimen: Expectorated Sputum  Result Value Ref Range Status   Specimen Description EXPECTORATED SPUTUM  Final   Special Requests NONE  Final   Sputum evaluation   Final    THIS SPECIMEN IS ACCEPTABLE FOR SPUTUM CULTURE Performed at [facility], [address] [place], [place] [address]    Report Status 02/07/2023 FINAL  Final  Culture,  Respiratory w Gram Stain     Status: None   Collection Time: 02/06/23  9:20 PM  Result Value  Ref Range Status   Specimen Description EXPECTORATED SPUTUM  Final   Special Requests NONE Reflexed from [id number]  Final   Gram Stain   Final    FEW WBC PRESENT, PREDOMINANTLY PMN RARE GRAM POSITIVE COCCI    Culture   Final    RARE Normal respiratory flora-no Staph aureus or Pseudomonas seen Performed at [facility], [address], [place], [place] [address]    Report Status 02/09/2023 FINAL  Final  Urine Culture (for pregnant, neutropenic or urologic patients or patients with an indwelling urinary catheter)     Status: Abnormal   Collection Time: 02/07/23  2:00 AM   Specimen: Urine, Clean Catch  Result Value Ref Range Status   Specimen Description URINE, CLEAN CATCH  Final   Special Requests   Final    NONE Performed at [facility], [address], [place], [place] [address]    Culture 80,000 COLONIES/mL YEAST (A)  Final   Report Status 02/08/2023 FINAL  Final         Radiology Studies: No results found.      Scheduled Meds:  albuterol  2.5 mg Nebulization BID   amLODipine  5 mg Oral Daily   enoxaparin (LOVENOX) injection  40 mg Subcutaneous Daily   fluconazole  200 mg Oral Q24H   guaiFENesin  600 mg Oral BID   hydrALAZINE  25 mg Oral Q8H   melatonin  3 mg Oral QHS   polyethylene glycol  17 g Oral QODAY   sodium chloride flush  3 mL Intravenous Q12H   Continuous Infusions:     LOS: 5 days    Time spent: 45 minutes spent on chart review, discussion with nursing staff, consultants, updating family and interview/physical exam; more than 50% of that time was spent in counseling and/or coordination of care.    Joseph Art, DO [facility] Available via Epic secure chat 7am-7pm After these hours, please refer to coverage provider listed on [email or web address] 02/10/2023, 11:44 AM

## 2023-02-11 DIAGNOSIS — I10 Essential (primary) hypertension: Secondary | ICD-10-CM

## 2023-02-11 MED ORDER — HYDRALAZINE HCL 25 MG PO TABS: 25.0000 mg | ORAL_TABLET | Freq: Three times a day (TID) | ORAL | Status: DC

## 2023-02-11 MED ORDER — ACETAMINOPHEN 325 MG PO TABS: 650.0000 mg | ORAL_TABLET | Freq: Four times a day (QID) | ORAL | Status: DC | PRN

## 2023-02-11 MED ORDER — MELATONIN 3 MG PO TABS: 3.0000 mg | ORAL_TABLET | Freq: Every day | ORAL | 0 refills | Status: DC

## 2023-02-11 MED ORDER — AMLODIPINE BESYLATE 2.5 MG PO TABS: 2.5000 mg | ORAL_TABLET | Freq: Every day | ORAL | Status: DC

## 2023-02-11 MED ORDER — AMLODIPINE BESYLATE 5 MG PO TABS
2.5000 mg | ORAL_TABLET | Freq: Every day | ORAL | Status: DC
  Administered 2023-02-11: 2.5 mg via ORAL
  Filled 2023-02-11: qty 1

## 2023-02-11 NOTE — Progress Notes (Signed)
Inpatient Rehabilitation Admissions Coordinator   I met with patient and her daughter to discuss denial for CIR admit. Daughter requesting PM & R recommendations for home health and recommendations for further rehab at [facility]. I will alert acute team, Dr Riley Kill and TOC. We will sign off.  Ottie Glazier, RN, MSN Rehab Admissions Coordinator [phone] 02/11/2023 2:37 PM   Ottie Glazier, RN, MSN Rehab Admissions Coordinator [phone] 02/11/2023 2:37 PM

## 2023-02-11 NOTE — Discharge Summary (Signed)
Physician Discharge Summary  Cheryl Monroe ZOX:[id number] DOB: 14-Apr-1935 DOA: 02/04/2023  PCP: Mahlon Gammon, MD  Admit date: 02/04/2023 Discharge date: 02/11/2023  Admitted From: [facility] Discharge disposition: [facility]   Recommendations for Outpatient Follow-Up:   Continue pulm toilet Aspiration precautions Monitor BP and adjust BP meds   Discharge Diagnosis:   Principal Problem:   CAP (community acquired pneumonia) Active Problems:   Recurrent UTI   Dementia without behavioral disturbance   Aphasia   Weakness    Discharge Condition: Improved.  Diet recommendation:   Regular.  Wound care: None.  Code status: DNR   History of Present Illness:   Cheryl Monroe is a 88 y.o. female with medical history significant of  Alzheimer's dementia with aphasia, hypertension, orthostatic hypotension,  recurrent UTIs, and urinary incontinence who presented due to plaints of cough.  History is limited from the patient and  additional information is obtained from review of records and her son over the phone. He had gone to visit her 3 days ago and noted that she had a little congestion and cough.  However, the following day she seemed to be more lethargic and less interactive than usual.  He states that usually whenever she has a infection it does make her more confused and worsens her ability to speak.  It was also reported that she had a possible choking event while coughing reported only of her own mucus.  Normally patient is on a regular diet without restriction.  She had been seen by Dr. Chales Abrahams on 4/15.  A portable chest x-ray had been obtained that showed a left lobe zone, acute perihilar inflammation, and left lower lobe atelectasis.  Due to her symptoms she had been started on doxycycline complete a 7-day course as well as Mucinex.  Patient has been placed on 2 L nasal cannula oxygen after O2 saturations were noted to be less than 90%.  Patient's son notes that at  [facility] she has basically been wheelchair-bound due to the facility not having the staff.  He request PT/OT to evaluate the patient to come up with a better plan for ambulation.  She has no prior history  of asthma or tobacco use.   In the emergency department patient was noted to be tachypneic with O2 saturations currently maintained on 2 L nasal cannula oxygen.  Labs noted to be within normal limits, albumin 1.2.  Lactic acid 1.2, high-sensitivity troponins negative x 2.  Chest x-ray noted new bibasilar opacities which likely due to atelectasis.  Urinalysis noted large leukocytes, few bacteria present, and greater than 50 WBCs.  CT angio of the chest that noted on the left lower lobe atelectasis/collapse with air bronchograms concerning for atelectasis versus pneumonia.  Patient had been started on Parikh antibiotics of Rocephin and azithromycin.   Hospital Course by Problem:   Community-acquired pneumonia vs viral URI (+ rhinovirus/enterovirus) Acute.  Patient had reportedly had a episode where she got choked up 2 days ago.  Since that time she had seemed to have more congestion and cough.  She has seen her primary care provider 2 days ago and was started on doxycycline.  She was also noted to have O2 saturations less than 90% for which she was started on 2 L of nasal cannula oxygen.  CT angiogram noting no pulmonary embolism and left lower lobe atelectasis/collapse with air bronchograms concerning for atelectasis versus pneumonia.   -procalcitonin negative x 3 days -NP swab: + rhinovirus -off O2 -Aspiration precautions  elevation of the head of the bed -Pulmonary toiletry every 4 hours while awake- flutter/incentive spirometry -OOB -Continue empiric antibiotics of Rocephin and azithromycin for now -Scheduled albuterol nebs twice daily and as needed -Mucinex -resp. culture normal flora -Speech therapy consulted -- regular diet   Hypokalemia -repleted   HTN -on hydralazine-- increased  dose 4/21 -added norvasc  -do not want to over-treat   Possible urinary tract infection Present on arrival.  Patient with history of recurrent urinary tract infections.  Urinalysis noted large leukocytes, few bacteria, 6-10 RBC/hpf, and greater than 50 WBCs. -Follow-up urine culture (appears to have been done after abx given)- culture shows yeast-- added fluconazole x 3 days -Adequate coverage with antibiotics for PNA x 5 days   Alzheimer's dementia, without behavior disturbance Expressive aphasia At baseline patient has expressive aphasia due to  Alzheimer's dementia.  No prior history of issues with swallowing previously.   Weakness/debility Patient had previously been able to ambulate, but had been mostly wheelchair-bound here lately. -PT/OT to eval and treat to see if a better plan can be formulated to get the patient ambulating again -family request PM&R-- Dr. Riley Kill saw        Medical Consultants:    PM and R  Discharge Exam:   Vitals:   02/11/23 0840 02/11/23 1600  BP: (!) 122/48 (!) 173/83  Pulse: 69 69  Resp: 18   Temp: 98.6 F (37 C) 98 F (36.7 C)  SpO2: 92% 100%   Vitals:   02/10/23 2106 02/11/23 0530 02/11/23 0840 02/11/23 1600  BP: (!) 144/73 (!) 134/54 (!) 122/48 (!) 173/83  Pulse: 75 65 69 69  Resp: 15 17 18    Temp: 97.9 F (36.6 C) 98.3 F (36.8 C) 98.6 F (37 C) 98 F (36.7 C)  TempSrc: Oral Oral Oral Oral  SpO2: 95% 93% 92% 100%  Weight:            The results of significant diagnostics from this hospitalization (including imaging, microbiology, ancillary and laboratory) are listed below for reference.     Procedures and Diagnostic Studies:   CT Angio Chest PE W/Cm &/Or Wo Cm  Result Date: 02/05/2023 CLINICAL DATA:  Cough, possible aspiration pneumonia EXAM: CT ANGIOGRAPHY CHEST WITH CONTRAST TECHNIQUE: Multidetector CT imaging of the chest was performed using the standard protocol during bolus administration of intravenous contrast.  Multiplanar CT image reconstructions and MIPs were obtained to evaluate the vascular anatomy. RADIATION DOSE REDUCTION: This exam was performed according to the departmental dose-optimization program which includes automated exposure control, adjustment of the mA and/or kV according to patient size and/or use of iterative reconstruction technique. CONTRAST:  75mL OMNIPAQUE IOHEXOL 350 MG/ML SOLN COMPARISON:  Chest radiograph dated 02/04/2023 FINDINGS: Cardiovascular: Satisfactory opacification of the bilateral pulmonary arteries to the lobar level. No evidence of pulmonary embolism. Study is not tailored for evaluation of the thoracic aorta. Ascending thoracic aorta measures 4.0 cm. Atherosclerotic calcifications of the aortic arch. The heart is normal in size.  No pericardial effusion. Moderate three-vessel coronary atherosclerosis. Mediastinum/Nodes: No suspicious mediastinal lymphadenopathy. Left thyroid goiter, without discrete nodule. Lungs/Pleura: Left lower lobe atelectasis/collapse with air bronchograms. Mild right lower lobe opacity, likely atelectasis. No suspicious pulmonary nodules. No pleural effusion or pneumothorax. Upper Abdomen: Visualized upper abdomen is grossly unremarkable, noting vascular calcifications. Musculoskeletal: Visualized osseous structures are within normal limits. Review of the MIP images confirms the above findings. IMPRESSION: No evidence of pulmonary embolism. Left lower lobe atelectasis/collapse with air bronchograms. Differential considerations include atelectasis (  favored) versus pneumonia. Ascending thoracic aorta measures 4.0 cm. Recommend annual imaging followup by CTA or MRA. This recommendation follows 2010 ACCF/AHA/AATS/ACR/ASA/SCA/SCAI/SIR/STS/SVM Guidelines for the Diagnosis and Management of Patients with Thoracic Aortic Disease. Circulation. 2010; 121: U045-W098. Aortic aneurysm NOS (ICD10-I71.9) Aortic Atherosclerosis (ICD10-I70.0). Electronically Signed   By:  Charline Bills M.D.   On: 02/05/2023 00:12   DG Chest Port 1 View  Result Date: 02/04/2023 CLINICAL DATA:  Cough EXAM: PORTABLE CHEST 1 VIEW COMPARISON:  Chest x-ray dated April 15, 2022 FINDINGS: Rightward patient rotation limits evaluation. Within limitations, cardiac and mediastinal contours are unchanged. New mild bibasilar opacities which are likely due to atelectasis. No evidence of pleural effusion or pneumothorax. IMPRESSION: New mild bibasilar opacities which are likely due to atelectasis. Electronically Signed   By: Allegra Lai M.D.   On: 02/04/2023 21:51     Labs:   Basic Metabolic Panel: Recent Labs  Lab 02/04/23 2205 02/05/23 1023 02/07/23 0504 02/08/23 0423 02/10/23 0348  NA 137 139 137 136 138  K 3.8 3.7 3.6 3.3* 3.7  CL 101 106 103 104 101  CO2 26 24 21* 25 25  GLUCOSE 124* 94 110* 116* 98  BUN 21 13 13 13  6*  CREATININE 0.92 0.61 0.76 0.72 0.66  CALCIUM 8.5* 8.4* 8.8* 8.6* 8.7*   GFR Estimated Creatinine Clearance: 47.8 mL/min (by C-G formula based on SCr of 0.66 mg/dL). Liver Function Tests: Recent Labs  Lab 02/04/23 2205  AST 24  ALT 19  ALKPHOS 82  BILITOT 0.9  PROT 6.0*  ALBUMIN 2.7*   No results for input(s): "LIPASE", "AMYLASE" in the last 168 hours. No results for input(s): "AMMONIA" in the last 168 hours. Coagulation profile No results for input(s): "INR", "PROTIME" in the last 168 hours.  CBC: Recent Labs  Lab 02/04/23 2205 02/07/23 0504 02/08/23 0423 02/10/23 0348  WBC 6.5 7.9 6.8 7.7  NEUTROABS 4.0  --   --   --   HGB 12.8 13.4 12.2 12.4  HCT 40.1 40.4 36.6 39.0  MCV 92.0 87.1 87.8 90.1  PLT 166 182 192 215   Cardiac Enzymes: No results for input(s): "CKTOTAL", "CKMB", "CKMBINDEX", "TROPONINI" in the last 168 hours. BNP: Invalid input(s): "POCBNP" CBG: Recent Labs  Lab 02/08/23 1143 02/10/23 2109  GLUCAP 157* 143*   D-Dimer No results for input(s): "DDIMER" in the last 72 hours. Hgb A1c No results for  input(s): "HGBA1C" in the last 72 hours. Lipid Profile No results for input(s): "CHOL", "HDL", "LDLCALC", "TRIG", "CHOLHDL", "LDLDIRECT" in the last 72 hours. Thyroid function studies No results for input(s): "TSH", "T4TOTAL", "T3FREE", "THYROIDAB" in the last 72 hours.  Invalid input(s): "FREET3" Anemia work up No results for input(s): "VITAMINB12", "FOLATE", "FERRITIN", "TIBC", "IRON", "RETICCTPCT" in the last 72 hours. Microbiology Recent Results (from the past 240 hour(s))  SARS Coronavirus 2 by RT PCR (hospital order, performed in [facility] hospital lab) *cepheid single result test* Anterior Nasal Swab     Status: None   Collection Time: 02/04/23  9:29 PM   Specimen: Anterior Nasal Swab  Result Value Ref Range Status   SARS Coronavirus 2 by RT PCR NEGATIVE NEGATIVE Final    Comment: Performed at [facility], [address] [place], [place] [address]  Culture, blood (routine x 2)     Status: None   Collection Time: 02/04/23  9:50 PM   Specimen: BLOOD  Result Value Ref Range Status   Specimen Description BLOOD SITE NOT SPECIFIED  Final  Special Requests   Final    BOTTLES DRAWN AEROBIC AND ANAEROBIC Blood Culture results may not be optimal due to an inadequate volume of blood received in culture bottles   Culture   Final    NO GROWTH 5 DAYS Performed at [facility], [address] [place], [place] [address]    Report Status 02/09/2023 FINAL  Final  Culture, blood (routine x 2)     Status: None   Collection Time: 02/04/23 10:05 PM   Specimen: BLOOD  Result Value Ref Range Status   Specimen Description BLOOD SITE NOT SPECIFIED  Final   Special Requests   Final    BOTTLES DRAWN AEROBIC AND ANAEROBIC Blood Culture adequate volume   Culture   Final    NO GROWTH 5 DAYS Performed at [facility], [address] [place], [place] [address]    Report Status 02/09/2023 FINAL  Final  Respiratory (~20 pathogens) panel by PCR     Status: Abnormal   Collection  Time: 02/06/23  9:36 AM   Specimen: Nasopharyngeal Swab; Respiratory  Result Value Ref Range Status   Adenovirus NOT DETECTED NOT DETECTED Final   Coronavirus 229E NOT DETECTED NOT DETECTED Final    Comment: (NOTE) The Coronavirus on the Respiratory Panel, DOES NOT test for the novel  Coronavirus (2019 nCoV)    Coronavirus HKU1 NOT DETECTED NOT DETECTED Final   Coronavirus NL63 NOT DETECTED NOT DETECTED Final   Coronavirus OC43 NOT DETECTED NOT DETECTED Final   Metapneumovirus NOT DETECTED NOT DETECTED Final   Rhinovirus / Enterovirus DETECTED (A) NOT DETECTED Final   Influenza A NOT DETECTED NOT DETECTED Final   Influenza B NOT DETECTED NOT DETECTED Final   Parainfluenza Virus 1 NOT DETECTED NOT DETECTED Final   Parainfluenza Virus 2 NOT DETECTED NOT DETECTED Final   Parainfluenza Virus 3 NOT DETECTED NOT DETECTED Final   Parainfluenza Virus 4 NOT DETECTED NOT DETECTED Final   Respiratory Syncytial Virus NOT DETECTED NOT DETECTED Final   Bordetella pertussis NOT DETECTED NOT DETECTED Final   Bordetella Parapertussis NOT DETECTED NOT DETECTED Final   Chlamydophila pneumoniae NOT DETECTED NOT DETECTED Final   Mycoplasma pneumoniae NOT DETECTED NOT DETECTED Final    Comment: Performed at [facility], [address] [place], [place] [address]  Culture, Respiratory w Gram Stain     Status: None   Collection Time: 02/06/23  3:40 PM   Specimen: Tracheal Aspirate; Respiratory  Result Value Ref Range Status   Specimen Description TRACHEAL ASPIRATE  Final   Special Requests NONE  Final   Gram Stain NO WBC SEEN NO ORGANISMS SEEN   Final   Culture   Final    RARE Normal respiratory flora-no Staph aureus or Pseudomonas seen Performed at [facility], [address] [place], [place] [address]    Report Status 02/09/2023 FINAL  Final  Expectorated Sputum Assessment w Gram Stain, Rflx to Resp Cult     Status: None   Collection Time: 02/06/23  9:20 PM   Specimen: Expectorated  Sputum  Result Value Ref Range Status   Specimen Description EXPECTORATED SPUTUM  Final   Special Requests NONE  Final   Sputum evaluation   Final    THIS SPECIMEN IS ACCEPTABLE FOR SPUTUM CULTURE Performed at [facility], [address] [place], [place] [address]    Report Status 02/07/2023 FINAL  Final  Culture, Respiratory w Gram Stain     Status:  None   Collection Time: 02/06/23  9:20 PM  Result Value Ref Range Status   Specimen Description EXPECTORATED SPUTUM  Final   Special Requests NONE Reflexed from [id number]  Final   Gram Stain   Final    FEW WBC PRESENT, PREDOMINANTLY PMN RARE GRAM POSITIVE COCCI    Culture   Final    RARE Normal respiratory flora-no Staph aureus or Pseudomonas seen Performed at [facility], [address] [place], [place] [address]    Report Status 02/09/2023 FINAL  Final  Urine Culture (for pregnant, neutropenic or urologic patients or patients with an indwelling urinary catheter)     Status: Abnormal   Collection Time: 02/07/23  2:00 AM   Specimen: Urine, Clean Catch  Result Value Ref Range Status   Specimen Description URINE, CLEAN CATCH  Final   Special Requests   Final    NONE Performed at [facility], [address], [place], [place] [address]    Culture 80,000 COLONIES/mL YEAST (A)  Final   Report Status 02/08/2023 FINAL  Final     Discharge Instructions:   Discharge Instructions     Diet general   Complete by: As directed    Discharge instructions   Complete by: As directed    Will need close monitoring of blood pressure and adjustment of meds   Increase activity slowly   Complete by: As directed       Allergies as of 02/11/2023       Reactions   Sulfa Antibiotics Rash   Rash to trunk, legs, neck, scalp, and arms        Medication List     TAKE these medications    acetaminophen 325 MG tablet Commonly known as: TYLENOL Take 2 tablets (650 mg total) by mouth every 6 (six) hours as needed for mild  pain (or Fever >/= 101).   amLODipine 2.5 MG tablet Commonly known as: NORVASC Take 1 tablet (2.5 mg total) by mouth daily. Start taking on: February 12, 2023   guaiFENesin 600 MG 12 hr tablet Commonly known as: MUCINEX Take 600 mg by mouth 2 (two) times daily as needed for cough.   hydrALAZINE 25 MG tablet Commonly known as: APRESOLINE Take 1 tablet (25 mg total) by mouth every 8 (eight) hours. What changed:  medication strength how much to take when to take this reasons to take this   ipratropium-albuterol 0.5-2.5 (3) MG/3ML Soln Commonly known as: DUONEB Take 3 mLs by nebulization in the morning and at bedtime for 3 days.   melatonin 3 MG Tabs tablet Take 1 tablet (3 mg total) by mouth at bedtime.   nitrofurantoin 50 MG capsule Commonly known as: MACRODANTIN Take 50 mg by mouth 2 (two) times daily.   polyethylene glycol 17 g packet Commonly known as: MIRALAX / GLYCOLAX Take 17 g by mouth every other day.   senna 8.6 MG Tabs tablet Commonly known as: SENOKOT Take 2 tablets by mouth at bedtime.   THERACRAN PO Take 250 mg by mouth 2 (two) times daily.          Time coordinating discharge: 45 min  Signed:  Joseph Art DO  [facility] 02/11/2023, 4:04 PM

## 2023-02-11 NOTE — TOC Transition Note (Addendum)
Transition of Care [facility]) - CM/SW Discharge Note   Patient Details  Name: Cheryl Monroe MRN: [id number] Date of Birth: 27-Nov-1934  [facility] Care [facility] CM/SW Contact:  Janae Bridgeman, RN Phone Number: 02/11/2023, 4:11 PM   Clinical Narrative:    CM called and spoke with the patient daughter at the bedside and daughter is agreeable to have patient's transferred back to [facility] today.  I met with the daughter at the bedside and she expressed goals for rehab when she returns to [facility].  I spoke with Madie Reno at explained these goals in depth with him via phone.  The daughter was provide with his name and she will follow up with him in the am.  Attending MD notified and discharge orders and summary will be placed.  Bedside nursing - please call report to [facility] at [phone].  [facility] was called for transport.   Final next level of care: Skilled Nursing Facility Barriers to Discharge: Continued Medical Work up   Patient Goals and CMS Choice CMS Medicare.gov Compare Post Acute Care list provided to:: Patient Represenative (must comment) (Patient's daughter)    Discharge Placement                         Discharge Plan and Services Additional resources added to the After Visit Summary for     Discharge Planning Services: CM Consult Post Acute Care Choice: Resumption of Svcs/PTA Provider, Skilled Nursing Facility                    HH Arranged: PT, OT (spoke with Pennie Rushing, PT at [facility])          Social Determinants of Health (SDOH) Interventions SDOH Screenings   Depression (PHQ2-9): Low Risk  (12/05/2022)  Tobacco Use: Low Risk  (02/04/2023)     Readmission Risk Interventions    02/11/2023    4:11 PM  Readmission Risk Prevention Plan  Post Dischage Appt Complete  Medication Screening Complete  Transportation Screening Complete

## 2023-02-11 NOTE — Progress Notes (Signed)
PROGRESS NOTE    Cheryl Monroe  ZOX:[id number] DOB: 1935/09/21 DOA: 02/04/2023 PCP: Mahlon Gammon, MD    Brief Narrative:  Cheryl Monroe is a 88 y.o. female with medical history significant of  Alzheimer's dementia with aphasia, hypertension, orthostatic hypotension,  recurrent UTIs, and urinary incontinence who presented due to plaints of cough.  History is limited from the patient and  additional information is obtained from review of records and her son over the phone. He had gone to visit her 3 days ago and noted that she had a little congestion and cough.  However, the following day she seemed to be more lethargic and less interactive than usual.  He states that usually whenever she has a infection it does make her more confused and worsens her ability to speak.  It was also reported that she had a possible choking event while coughing reported only of her own mucus.   Improved and now that CIR is not an option, will need to return to [facility]-- SNF portion, TOC consulted. Medically ready   Assessment and Plan: Community-acquired pneumonia vs viral URI (+ rhinovirus/enterovirus) Acute.  Patient had reportedly had a episode where she got choked up 2 days ago.  Since that time she had seemed to have more congestion and cough.  She has seen her primary care provider 2 days ago and was started on doxycycline.  She was also noted to have O2 saturations less than 90% for which she was started on 2 L of nasal cannula oxygen.  CT angiogram noting no pulmonary embolism and left lower lobe atelectasis/collapse with air bronchograms concerning for atelectasis versus pneumonia.   -procalcitonin negative x 3 days -NP swab: + rhinovirus -off O2 -Aspiration precautions elevation of the head of the bed -Pulmonary toiletry every 4 hours while awake- flutter/incentive spirometry -OOB -Continue empiric antibiotics of Rocephin and azithromycin for now -Scheduled albuterol nebs twice daily and as  needed -Mucinex -resp. culture normal flora -Speech therapy consulted -- regular diet   Hypokalemia -repleted  HTN -on hydralazine-- increased dose 4/21 -added norvasc  -do not want to over-treat  Possible urinary tract infection Present on arrival.  Patient with history of recurrent urinary tract infections.  Urinalysis noted large leukocytes, few bacteria, 6-10 RBC/hpf, and greater than 50 WBCs. -Follow-up urine culture (appears to have been done after abx given)- culture shows yeast-- added fluconazole x 3 days -Adequate coverage with antibiotics for PNA x 5 days   Alzheimer's dementia, without behavior disturbance Expressive aphasia At baseline patient has expressive aphasia due to  Alzheimer's dementia.  No prior history of issues with swallowing previously.   Weakness/debility Patient had previously been able to ambulate, but had been mostly wheelchair-bound here lately. -PT/OT to eval and treat to see if a better plan can be formulated to get the patient ambulating again -family request PM&R-- Dr. Riley Kill saw   DVT prophylaxis: enoxaparin (LOVENOX) injection 40 mg Start: 02/05/23 1000    Code Status: DNR Family Communication: spoke with daughter 4/23  Disposition Plan:  Level of care: Med-Surg Status is: Inpatient Remains inpatient appropriate because: return to [facility] when bed available    Consultants:  PM&R   Subjective: Pleasant with no complaints  Objective: Vitals:   02/10/23 2018 02/10/23 2106 02/11/23 0530 02/11/23 0840  BP:  (!) 144/73 (!) 134/54 (!) 122/48  Pulse: 75 75 65 69  Resp: 16 15 17 18   Temp:  97.9 F (36.6 C) 98.3 F (36.8 C) 98.6 F (37 C)  TempSrc:  Oral Oral Oral  SpO2: 96% 95% 93% 92%  Weight:       No intake or output data in the 24 hours ending 02/11/23 1329   Filed Weights   02/04/23 2109  Weight: 72 kg    Examination:   General: Appearance:     Overweight female in no acute distress     Lungs:      Diminished, not on O2  Heart:    Normal heart rate.   MS:   All extremities are intact.   Neurologic:   Awake, alert, pleasant and cooperative      Data Reviewed: I have personally reviewed following labs and imaging studies  CBC: Recent Labs  Lab 02/04/23 2205 02/07/23 0504 02/08/23 0423 02/10/23 0348  WBC 6.5 7.9 6.8 7.7  NEUTROABS 4.0  --   --   --   HGB 12.8 13.4 12.2 12.4  HCT 40.1 40.4 36.6 39.0  MCV 92.0 87.1 87.8 90.1  PLT 166 182 192 215   Basic Metabolic Panel: Recent Labs  Lab 02/04/23 2205 02/05/23 1023 02/07/23 0504 02/08/23 0423 02/10/23 0348  NA 137 139 137 136 138  K 3.8 3.7 3.6 3.3* 3.7  CL 101 106 103 104 101  CO2 26 24 21* 25 25  GLUCOSE 124* 94 110* 116* 98  BUN 6*  CREATININE 0.92 0.61 0.76 0.72 0.66  CALCIUM 8.5* 8.4* 8.8* 8.6* 8.7*   GFR: Estimated Creatinine Clearance: 47.8 mL/min (by C-G formula based on SCr of 0.66 mg/dL). Liver Function Tests: Recent Labs  Lab 02/04/23 2205  AST 24  ALT 19  ALKPHOS 82  BILITOT 0.9  PROT 6.0*  ALBUMIN 2.7*   No results for input(s): "LIPASE", "AMYLASE" in the last 168 hours. No results for input(s): "AMMONIA" in the last 168 hours. Coagulation Profile: No results for input(s): "INR", "PROTIME" in the last 168 hours. Cardiac Enzymes: No results for input(s): "CKTOTAL", "CKMB", "CKMBINDEX", "TROPONINI" in the last 168 hours. BNP (last 3 results) No results for input(s): "PROBNP" in the last 8760 hours. HbA1C: No results for input(s): "HGBA1C" in the last 72 hours. CBG: Recent Labs  Lab 02/08/23 1143 02/10/23 2109  GLUCAP 157* 143*   Lipid Profile: No results for input(s): "CHOL", "HDL", "LDLCALC", "TRIG", "CHOLHDL", "LDLDIRECT" in the last 72 hours. Thyroid Function Tests: No results for input(s): "TSH", "T4TOTAL", "FREET4", "T3FREE", "THYROIDAB" in the last 72 hours. Anemia Panel: No results for input(s): "VITAMINB12", "FOLATE", "FERRITIN", "TIBC", "IRON", "RETICCTPCT" in  the last 72 hours. Sepsis Labs: Recent Labs  Lab 02/04/23 2205 02/05/23 1023 02/07/23 0614  PROCALCITON  --  <0.10 <0.10  LATICACIDVEN 1.2  --   --     Recent Results (from the past 240 hour(s))  SARS Coronavirus 2 by RT PCR (hospital order, performed in [facility] hospital lab) *cepheid single result test* Anterior Nasal Swab     Status: None   Collection Time: 02/04/23  9:29 PM   Specimen: Anterior Nasal Swab  Result Value Ref Range Status   SARS Coronavirus 2 by RT PCR NEGATIVE NEGATIVE Final    Comment: Performed at [facility], [address] [place], [place] [address]  Culture, blood (routine x 2)     Status: None   Collection Time: 02/04/23  9:50 PM   Specimen: BLOOD  Result Value Ref Range Status   Specimen Description BLOOD SITE NOT SPECIFIED  Final   Special Requests   Final  BOTTLES DRAWN AEROBIC AND ANAEROBIC Blood Culture results may not be optimal due to an inadequate volume of blood received in culture bottles   Culture   Final    NO GROWTH 5 DAYS Performed at [facility], [address] [place], [place] [address]    Report Status 02/09/2023 FINAL  Final  Culture, blood (routine x 2)     Status: None   Collection Time: 02/04/23 10:05 PM   Specimen: BLOOD  Result Value Ref Range Status   Specimen Description BLOOD SITE NOT SPECIFIED  Final   Special Requests   Final    BOTTLES DRAWN AEROBIC AND ANAEROBIC Blood Culture adequate volume   Culture   Final    NO GROWTH 5 DAYS Performed at [facility], [address], [place], [place] [address]    Report Status 02/09/2023 FINAL  Final  Respiratory (~20 pathogens) panel by PCR     Status: Abnormal   Collection Time: 02/06/23  9:36 AM   Specimen: Nasopharyngeal Swab; Respiratory  Result Value Ref Range Status   Adenovirus NOT DETECTED NOT DETECTED Final   Coronavirus 229E NOT DETECTED NOT DETECTED Final    Comment: (NOTE) The Coronavirus on the Respiratory Panel, DOES NOT test for the  novel  Coronavirus (2019 nCoV)    Coronavirus HKU1 NOT DETECTED NOT DETECTED Final   Coronavirus NL63 NOT DETECTED NOT DETECTED Final   Coronavirus OC43 NOT DETECTED NOT DETECTED Final   Metapneumovirus NOT DETECTED NOT DETECTED Final   Rhinovirus / Enterovirus DETECTED (A) NOT DETECTED Final   Influenza A NOT DETECTED NOT DETECTED Final   Influenza B NOT DETECTED NOT DETECTED Final   Parainfluenza Virus 1 NOT DETECTED NOT DETECTED Final   Parainfluenza Virus 2 NOT DETECTED NOT DETECTED Final   Parainfluenza Virus 3 NOT DETECTED NOT DETECTED Final   Parainfluenza Virus 4 NOT DETECTED NOT DETECTED Final   Respiratory Syncytial Virus NOT DETECTED NOT DETECTED Final   Bordetella pertussis NOT DETECTED NOT DETECTED Final   Bordetella Parapertussis NOT DETECTED NOT DETECTED Final   Chlamydophila pneumoniae NOT DETECTED NOT DETECTED Final   Mycoplasma pneumoniae NOT DETECTED NOT DETECTED Final    Comment: Performed at [facility], [address] [place], [place] [address]  Culture, Respiratory w Gram Stain     Status: None   Collection Time: 02/06/23  3:40 PM   Specimen: Tracheal Aspirate; Respiratory  Result Value Ref Range Status   Specimen Description TRACHEAL ASPIRATE  Final   Special Requests NONE  Final   Gram Stain NO WBC SEEN NO ORGANISMS SEEN   Final   Culture   Final    RARE Normal respiratory flora-no Staph aureus or Pseudomonas seen Performed at [facility], [address], [place], [place] [address]    Report Status 02/09/2023 FINAL  Final  Expectorated Sputum Assessment w Gram Stain, Rflx to Resp Cult     Status: None   Collection Time: 02/06/23  9:20 PM   Specimen: Expectorated Sputum  Result Value Ref Range Status   Specimen Description EXPECTORATED SPUTUM  Final   Special Requests NONE  Final   Sputum evaluation   Final    THIS SPECIMEN IS ACCEPTABLE FOR SPUTUM CULTURE Performed at [facility], [address], [place], [place] [address]     Report Status 02/07/2023 FINAL  Final  Culture, Respiratory w Gram Stain     Status: None   Collection Time: 02/06/23  9:20  PM  Result Value Ref Range Status   Specimen Description EXPECTORATED SPUTUM  Final   Special Requests NONE Reflexed from [id number]  Final   Gram Stain   Final    FEW WBC PRESENT, PREDOMINANTLY PMN RARE GRAM POSITIVE COCCI    Culture   Final    RARE Normal respiratory flora-no Staph aureus or Pseudomonas seen Performed at [facility], [address] [place], [place] [address]    Report Status 02/09/2023 FINAL  Final  Urine Culture (for pregnant, neutropenic or urologic patients or patients with an indwelling urinary catheter)     Status: Abnormal   Collection Time: 02/07/23  2:00 AM   Specimen: Urine, Clean Catch  Result Value Ref Range Status   Specimen Description URINE, CLEAN CATCH  Final   Special Requests   Final    NONE Performed at [facility], [address] [place], [place] [address]    Culture 80,000 COLONIES/mL YEAST (A)  Final   Report Status 02/08/2023 FINAL  Final         Radiology Studies: No results found.      Scheduled Meds:  amLODipine  2.5 mg Oral Daily   enoxaparin (LOVENOX) injection  40 mg Subcutaneous Daily   fluconazole  200 mg Oral Q24H   guaiFENesin  600 mg Oral BID   hydrALAZINE  25 mg Oral Q8H   melatonin  3 mg Oral QHS   polyethylene glycol  17 g Oral QODAY   sodium chloride flush  3 mL Intravenous Q12H   Continuous Infusions:     LOS: 6 days    Time spent: 45 minutes spent on chart review, discussion with nursing staff, consultants, updating family and interview/physical exam; more than 50% of that time was spent in counseling and/or coordination of care.    Joseph Art, DO [facility] Available via Epic secure chat 7am-7pm After these hours, please refer to coverage provider listed on [email or web address] 02/11/2023, 1:29 PM

## 2023-02-11 NOTE — Progress Notes (Signed)
Occupational Therapy Treatment Patient Details Name: Cheryl Monroe MRN: [id number] DOB: [birth year] Today's Date: 02/11/2023   History of present illness 88 yo female presents to [facility] on 4/16 with cough, aspiration PNA. PMH includes:  Alzheimer's disease, balance deficits, OA, spinal stenosis, recurrent UTIs.   OT comments  Pt received in bathroom where NT and daughter assisting with ADLs. OT session focused on completion of ADLs w/ use of Stedy. Overall, pt requires Max A x 1-2 to stand in [place], up to 5 min on first trial, though began to fatigue and require more rest breaks. Pt able to assist with UB ADLs from Setup to Mod A and requires Total A x 2 for LB ADL assist standing in [place]. Daughter hands on to assist throughout and hopeful for continued improvements w/ rehab. Feel that continued progress will require a longer period of time in a lower intensity setting.   Recommendations for follow up therapy are one component of a multi-disciplinary discharge planning process, led by the attending physician.  Recommendations may be updated based on patient status, additional functional criteria and insurance authorization.    Assistance Recommended at Discharge Frequent or constant Supervision/Assistance  Patient can return home with the following  Two people to help with walking and/or transfers;Two people to help with bathing/dressing/bathroom   Equipment Recommendations  None recommended by OT    Recommendations for Other Services      Precautions / Restrictions Precautions Precautions: Fall Precaution Comments: expressive aphasia 2/2 dementia Restrictions Weight Bearing Restrictions: No       Mobility Bed Mobility               General bed mobility comments: received on toilet    Transfers Overall transfer level: Needs assistance Equipment used: Ambulation equipment used Transfers: Sit to/from Stand Sit to Stand: Max assist, +2 physical assistance, +2  safety/equipment           General transfer comment: heavy Max A x 1-2 for standing in [place] from toilet for ADL assist x 3 times w/ rest breaks needed.     Balance Overall balance assessment: Needs assistance Sitting-balance support: No upper extremity supported, Feet supported Sitting balance-Leahy Scale: Fair     Standing balance support: Bilateral upper extremity supported, Reliant on assistive device for balance Standing balance-Leahy Scale: Poor                             ADL either performed or assessed with clinical judgement   ADL Overall ADL's : Needs assistance/impaired     Grooming: Set up;Sitting;Applying deodorant;Oral care Grooming Details (indicate cue type and reason): cues for pt to attempt donning deodorant rather than daughter assisting and with encouragement, pt able to don deodorant. Use of Stedy at sink for brushing teeth and encouragement to continue standing in stedy due to fatigue         Upper Body Dressing : Moderate assistance;Sitting Upper Body Dressing Details (indicate cue type and reason): cues to allow pt to attempt with pt able to don overhead and assist with UE into sleeve Lower Body Dressing: Total assistance;+2 for physical assistance;+2 for safety/equipment;Sit to/from stand Lower Body Dressing Details (indicate cue type and reason): +2 with daughter assisting, standing in [place] to don/pull up brief and pants, mgmt of socks and tennis shoes     Toileting- Clothing Manipulation and Hygiene: Total assistance;+2 for physical assistance;+2 for safety/equipment;Sit to/from stand Toileting - Clothing Manipulation Details (indicate cue type  and reason): heavy Max A to maintain standing in [place] while daughter assisting w/ hygiene       General ADL Comments: Received on toilet with daughter and NT prepping for bath.    Extremity/Trunk Assessment Upper Extremity Assessment Upper Extremity Assessment: Generalized weakness   Lower  Extremity Assessment Lower Extremity Assessment: Defer to PT evaluation        Vision   Vision Assessment?: No apparent visual deficits   Perception     Praxis      Cognition Arousal/Alertness: Awake/alert Behavior During Therapy: WFL for tasks assessed/performed Overall Cognitive Status: History of cognitive impairments - at baseline                                 General Comments: history of dementia with associated expressive aphasia. Pt follows commands with increased time, motor apraxia noted. requires encouragement to attempt tasks but when cued to attempt first, pt will typically follow through        Exercises      Shoulder Instructions       General Comments      Pertinent Vitals/ Pain       Pain Assessment Pain Assessment: Faces Faces Pain Scale: Hurts a little bit Pain Location: bottom with hygiene assist Pain Descriptors / Indicators: Grimacing Pain Intervention(s): Monitored during session  Home Living                                          Prior Functioning/Environment              Frequency  Min 2X/week        Progress Toward Goals  OT Goals(current goals can now be found in the care plan section)  Progress towards OT goals: Progressing toward goals  Acute Rehab OT Goals Patient Stated Goal: lay back down OT Goal Formulation: With patient/family Time For Goal Achievement: 02/20/23 Potential to Achieve Goals: Fair ADL Goals Pt Will Transfer to Toilet: with mod assist;bedside commode;stand pivot transfer Pt/caregiver will Perform Home Exercise Program: Increased strength;Both right and left upper extremity;With theraband;With Supervision;With written HEP provided Additional ADL Goal #1: Pt to complete bed mobility to sit EOB with Mod A in prep for ADL tasks  Plan Discharge plan remains appropriate    Co-evaluation                 AM-PAC OT "6 Clicks" Daily Activity     Outcome  Measure   Help from another person eating meals?: A Little Help from another person taking care of personal grooming?: A Little Help from another person toileting, which includes using toliet, bedpan, or urinal?: Total Help from another person bathing (including washing, rinsing, drying)?: A Lot Help from another person to put on and taking off regular upper body clothing?: A Lot Help from another person to put on and taking off regular lower body clothing?: Total 6 Click Score: 12    End of Session Equipment Utilized During Treatment: Gait belt  OT Visit Diagnosis: Unsteadiness on feet (R26.81);Other abnormalities of gait and mobility (R26.89);Muscle weakness (generalized) (M62.81)   Activity Tolerance Patient limited by fatigue;Patient tolerated treatment well   Patient Left in chair;with call bell/phone within reach;with family/visitor present   Nurse Communication Other (comment);Mobility status (discussed with NT)        Time:  4332-9518 OT Time Calculation (min): 38 min  Charges: OT General Charges $OT Visit: 1 Visit OT Treatments $Self Care/Home Management : 23-37 mins  Bradd Canary, OTR/L Acute Rehab Services Office: [phone]   Lorre Munroe 02/11/2023, 12:24 PM

## 2023-02-11 NOTE — NC FL2 (Signed)
[place] MEDICAID FL2 LEVEL OF CARE FORM     IDENTIFICATION  Patient Name: Cheryl Monroe Birthdate: [birth year] Sex: female Admission Date (Current Location): 02/04/2023  [facility] and [place] Number:  Producer, television/film/video and Address:  [facility], [address], [place], [place] [address]      Provider Number: [id number]  Attending Physician Name and Address:  Joseph Art, DO  Relative Name and Phone Number:  Akiah Bauch - daughter - [phone]    Current Level of Care: Hospital Recommended Level of Care: Skilled Nursing Facility Prior Approval Number:    Date Approved/Denied:   PASRR Number:    Discharge Plan: SNF    Current Diagnoses: Patient Active Problem List   Diagnosis Date Noted   CAP (community acquired pneumonia) 02/05/2023   Aphasia 02/05/2023   Neurogenic bladder 04/18/2022   Acute metabolic encephalopathy 08/02/2021   Word finding difficulty 02/22/2021   History of COVID-19 07/26/2020   Orthostatic dizziness 07/19/2020   Balance problem 07/19/2020   Constipation 07/19/2020   Clitoral irritation 07/19/2020   Asymptomatic bacteriuria 07/19/2020   Urinary frequency 07/19/2020   Urge incontinence 07/19/2020   Weakness of left lower extremity 07/19/2020   Essential hypertension 07/19/2020   Weakness 07/19/2020   Impaired mobility 07/19/2020   Recurrent UTI 07/19/2020   Dementia without behavioral disturbance 07/19/2020   Edema 07/19/2020   Spinal stenosis 07/19/2020    Orientation RESPIRATION BLADDER Height & Weight     Self, Time, Situation  Normal Incontinent Weight: 72 kg Height:     BEHAVIORAL SYMPTOMS/MOOD NEUROLOGICAL BOWEL NUTRITION STATUS      Incontinent Diet  AMBULATORY STATUS COMMUNICATION OF NEEDS Skin   Extensive Assist                           Personal Care Assistance Level of Assistance  Bathing, Feeding, Dressing Bathing Assistance: Maximum assistance Feeding assistance: Limited  assistance Dressing Assistance: Maximum assistance     Functional Limitations Info  Sight, Hearing, Speech Sight Info: Adequate Hearing Info: Adequate Speech Info: Impaired    SPECIAL CARE FACTORS FREQUENCY  PT (By licensed PT), OT (By licensed OT)     PT Frequency: 5 x per week OT Frequency: 5 x per week            Contractures Contractures Info: Not present    Additional Factors Info  Code Status Code Status Info: DNR             Current Medications (02/11/2023):  This is the current hospital active medication list Current Facility-Administered Medications  Medication Dose Route Frequency Provider Last Rate Last Admin   acetaminophen (TYLENOL) tablet 650 mg  650 mg Oral Q6H PRN Clydie Braun, MD       Or   acetaminophen (TYLENOL) suppository 650 mg  650 mg Rectal Q6H PRN Madelyn Flavors A, MD       albuterol (PROVENTIL) (2.5 MG/3ML) 0.083% nebulizer solution 2.5 mg  2.5 mg Nebulization Q6H PRN Katrinka Blazing, Rondell A, MD       amLODipine (NORVASC) tablet 2.5 mg  2.5 mg Oral Daily Vann, Jessica U, DO   2.5 mg at 02/11/23 1027   enoxaparin (LOVENOX) injection 40 mg  40 mg Subcutaneous Daily Smith, Rondell A, MD   40 mg at 02/11/23 1029   fluconazole (DIFLUCAN) tablet 200 mg  200 mg Oral Q24H Vann, Jessica U, DO   200 mg at 02/10/23 1522  guaiFENesin (MUCINEX) 12 hr tablet 600 mg  600 mg Oral BID Marlin Canary U, DO   600 mg at 02/11/23 1027   hydrALAZINE (APRESOLINE) tablet 25 mg  25 mg Oral Q8H Vann, Jessica U, DO   25 mg at 02/11/23 1559   melatonin tablet 3 mg  3 mg Oral QHS Vann, Jessica U, DO   3 mg at 02/10/23 2157   polyethylene glycol (MIRALAX / GLYCOLAX) packet 17 g  17 g Oral QODAY Smith, Rondell A, MD   17 g at 02/11/23 1029   sodium chloride flush (NS) 0.9 % injection 3 mL  3 mL Intravenous Q12H Smith, Rondell A, MD   3 mL at 02/11/23 1034     Discharge Medications: Please see discharge summary for a list of discharge medications.  Relevant Imaging  Results:  Relevant Lab Results:   Additional Information SSN: [id number];Moderna COVID-19 Vaccine 12/02/2019 , 11/04/2019  Moderna Covid-19 Booster Vaccine 06/12/2021 , 08/31/2020;  5'2" 153lbs  Janae Bridgeman, RN

## 2023-02-11 NOTE — Progress Notes (Signed)
[place] PHYSICAL MEDICINE AND REHABILITATION  CONSULT SERVICE NOTE   Reviewed therapy progress over weekend and Monday. Pt demonstrated some improvement on Saturday but was unable to carry that over on Monday despite a day of rest. (Pt reported feeling fatigued as well.)    While Cheryl Monroe has some rehab potential, she does not demonstrate nearly enough activity tolerance (or consistency) to approach 3 hours per day of therapy. I recommend SNF-level therapies and/or that the family hire a physical therapist to work with her longer term. Any physical/functional gains she makes will be due to a consistent, long-term approach. Acute inpatient rehab is not that place.   Ranelle Oyster, MD, [facility] Physical Medicine & Rehabilitation Medical Director Rehabilitation Services 02/11/2023

## 2023-02-11 NOTE — Progress Notes (Signed)
Report given to [place], LPN  at [facility].

## 2023-02-13 ENCOUNTER — Encounter: Payer: Self-pay | Admitting: Adult Health

## 2023-02-13 ENCOUNTER — Non-Acute Institutional Stay (SKILLED_NURSING_FACILITY): Payer: Medicare Other | Admitting: Adult Health

## 2023-02-13 DIAGNOSIS — N390 Urinary tract infection, site not specified: Secondary | ICD-10-CM

## 2023-02-13 DIAGNOSIS — R531 Weakness: Secondary | ICD-10-CM | POA: Diagnosis not present

## 2023-02-13 DIAGNOSIS — B3749 Other urogenital candidiasis: Secondary | ICD-10-CM

## 2023-02-13 DIAGNOSIS — J189 Pneumonia, unspecified organism: Secondary | ICD-10-CM

## 2023-02-13 DIAGNOSIS — R0989 Other specified symptoms and signs involving the circulatory and respiratory systems: Secondary | ICD-10-CM

## 2023-02-13 DIAGNOSIS — F0390 Unspecified dementia without behavioral disturbance: Secondary | ICD-10-CM

## 2023-02-13 NOTE — Progress Notes (Signed)
Location:  [facility] Room Number: 140 A Place of Service:  SNF [phone]) Provider:  Fletcher Anon, NP    Patient Care Team: Mahlon Gammon, MD as PCP - General (Internal Medicine) Jamison Neighbor, MD (Urology)  Extended Emergency Contact Information Primary Emergency Contact: Lumsden,[name] Address: [address]          [place], [place] [address] [place] Home Phone: [phone] Work Phone: [phone] Relation: Son Secondary Emergency Contact: Froning,Prachi  [place] Mobile Phone: [phone] Relation: Daughter  Code Status:  DNR Goals of care: Advanced Directive information    02/13/2023    8:37 AM  Advanced Directives  Does Patient Have a Medical Advance Directive? Yes  Type of Advance Directive Living will;Out of facility DNR (pink MOST or yellow form)  Does patient want to make changes to medical advance directive? No - Patient declined     Chief Complaint  Patient presents with   Hospitalization Follow-up    Follow-up from 02/04/23-02/11/23 hospital stay.    HPI:  Pt is a 88 y.o. female seen today for a hospital f/u s/p admission from 02/04/23-02/11/23. She was admitted for CAP with hypoxia and weakness.  PMH significant for dementia, aphasia, recurrent UTI, orthostatic hypotension, and urinary retention and incontinence Prior to admit she had a choking episode and a CXR was obtained whished showed left lower lobe atelectasis. She was placed on doxycycline. Her family was concerned due to weakness and lethargy and so she went to the hospital. She had a full work up including a CT of the chest which showed LLL atx with air bronchograms concerning for atx vs pna. Left thyroid goiter is noted. She was treated with rocephin and azithromycin. Nasal swab showed rhinovirus. She was tapered off oxygen. Evaluated by ST and recommended regular diet, thin liquid, reduced distractions. Presbyesophagus was  noted.   Urine cx grew 80,000 colonies of yeast 4/20 treated with diflucan  Her daughter who is a physician and at the bedside. She reports that her mom used to be able to do a car transfer in Feb but now can not and her goal would be for her to gain trunk strength and stand to transfer to the car.   Nurse reports she still has some cough and congestion Remains off oxygen, no sob.   During her stay her bp was elevated and hydralazine dose was scheduled and increased and norvasc was added Past Medical History:  Diagnosis Date   Alzheimer disease    Balance problem 07/19/2020   Constipation 07/19/2020   Dementia without behavioral disturbance 07/19/2020   MMSE 21/30 07/21/20   Fall    Osteoarthritis    Pyelonephritis    Spinal stenosis 07/19/2020   Weakness of left lower extremity 07/19/2020   History reviewed. No pertinent surgical history.  Allergies  Allergen Reactions   Sulfa Antibiotics Rash    Rash to trunk, legs, neck, scalp, and arms    Outpatient Encounter Medications as of 02/13/2023  Medication Sig   acetaminophen (TYLENOL) 325 MG tablet Take 2 tablets (650 mg total) by mouth every 6 (six) hours as needed for mild pain (or Fever >/= 101).   amLODipine (NORVASC) 2.5 MG tablet Take 1 tablet (2.5 mg total) by mouth daily.   Cranberry (THERACRAN PO) Take 250 mg by mouth 2 (two) times daily.   guaiFENesin (MUCINEX) 600 MG 12 hr tablet Take 600 mg by mouth 2 (two) times daily as  needed for cough.   hydrALAZINE (APRESOLINE) 25 MG tablet Take 1 tablet (25 mg total) by mouth every 8 (eight) hours.   ipratropium-albuterol (DUONEB) 0.5-2.5 (3) MG/3ML SOLN Take 3 mLs by nebulization in the morning and at bedtime for 3 days.   melatonin 3 MG TABS tablet Take 1 tablet (3 mg total) by mouth at bedtime.   nitrofurantoin (MACRODANTIN) 50 MG capsule Take 50 mg by mouth 2 (two) times daily.   polyethylene glycol (MIRALAX / GLYCOLAX) 17 g packet Take 17 g by mouth every other day.   senna  (SENOKOT) 8.6 MG TABS tablet Take 2 tablets by mouth at bedtime.   No facility-administered encounter medications on file as of 02/13/2023.    Review of Systems  Constitutional:  Negative for activity change, appetite change, chills, diaphoresis, fatigue, fever and unexpected weight change.  HENT:  Positive for congestion.   Respiratory:  Positive for cough. Negative for shortness of breath and wheezing.   Cardiovascular:  Negative for chest pain, palpitations and leg swelling.  Gastrointestinal:  Negative for abdominal distention, abdominal pain, constipation and diarrhea.  Genitourinary:  Negative for difficulty urinating and dysuria.  Musculoskeletal:  Positive for gait problem. Negative for arthralgias, back pain, joint swelling and myalgias.  Skin:  Positive for wound.  Neurological:  Positive for speech difficulty and weakness. Negative for dizziness, tremors, seizures, syncope, facial asymmetry, light-headedness, numbness and headaches.  Psychiatric/Behavioral:  Positive for confusion. Negative for agitation and behavioral problems.     Immunization History  Administered Date(s) Administered   Influenza, High Dose Seasonal PF 07/25/2021   Influenza-Unspecified 08/05/2018, 09/29/2018, 07/02/2019, 08/21/2019, 08/11/2020, 07/26/2022   Moderna Covid-19 Vaccine Bivalent Booster 59yrs & up 08/01/2021, 08/26/2022   Moderna SARS-COV2 Booster Vaccination 08/31/2020, 06/12/2021   Moderna Sars-Covid-2 Vaccination 11/04/2019, 12/02/2019   Pneumococcal Conjugate-13 09/28/2018   Pneumococcal Polysaccharide-23 10/21/2017   Tdap 02/23/2021   Zoster Recombinat (Shingrix) 05/24/2021, 07/24/2021   Pertinent  Health Maintenance Due  Topic Date Due   INFLUENZA VACCINE  05/22/2023   DEXA SCAN  Discontinued      04/25/2022   11:04 AM 06/03/2022    3:33 PM 10/01/2022    1:09 PM 11/05/2022   10:43 AM 12/05/2022    1:51 PM  Fall Risk  Falls in the past year?   0 0 0  Was there an injury with Fall?    0 0 0  Fall Risk Category Calculator   0 0 0  Fall Risk Category (Retired)   Low    (RETIRED) Patient Fall Risk Level High fall risk High fall risk High fall risk    Patient at Risk for Falls Due to   History of fall(s) History of fall(s) History of fall(s)  Fall risk Follow up   Falls evaluation completed Falls evaluation completed Falls evaluation completed   Functional Status Survey:    Vitals:   02/13/23 0826  BP: 111/72  Pulse: 69  Temp: (!) 97.3 F (36.3 C)  SpO2: 93%  Weight: 154 lb 6.4 oz (70 kg)  Height: 5\' 2"  (1.575 m)   Body mass index is 28.24 kg/m. Physical Exam Vitals and nursing note reviewed.  Constitutional:      General: She is not in acute distress.    Appearance: She is not diaphoretic.  HENT:     Head: Normocephalic and atraumatic.     Nose: Nose normal.     Mouth/Throat:     Mouth: Mucous membranes are moist.     Pharynx: Oropharynx  is clear.  Neck:     Vascular: No JVD.  Cardiovascular:     Rate and Rhythm: Normal rate and regular rhythm.     Heart sounds: No murmur heard. Pulmonary:     Effort: Pulmonary effort is normal. No respiratory distress.     Breath sounds: Rhonchi (LUL) present. No wheezing.  Abdominal:     General: Bowel sounds are normal. There is no distension.     Palpations: Abdomen is soft.     Tenderness: There is no abdominal tenderness.  Musculoskeletal:     Right lower leg: No edema.     Left lower leg: No edema.  Skin:    General: Skin is warm and dry.  Neurological:     Mental Status: She is alert.     Labs reviewed: Recent Labs    02/07/23 0504 02/08/23 0423 02/10/23 0348  NA 137 136 138  K 3.6 3.3* 3.7  CL 103 104 101  CO2 21* 25 25  GLUCOSE 110* 116* 98  BUN 13 13 6*  CREATININE 0.76 0.72 0.66  CALCIUM 8.8* 8.6* 8.7*   Recent Labs    11/07/22 0000 02/03/23 0000 02/04/23 2205  AST  --   --  24  ALT  --   --  19  ALKPHOS  --  96 82  BILITOT  --   --  0.9  PROT  --   --  6.0*  ALBUMIN 3.4*  3.9 2.7*   Recent Labs    04/25/22 1154 11/07/22 0000 02/04/23 2205 02/07/23 0504 02/08/23 0423 02/10/23 0348  WBC 6.9   < > 6.5 7.9 6.8 7.7  NEUTROABS 4.4  --  4.0  --   --   --   HGB 13.7   < > 12.8 13.4 12.2 12.4  HCT 42.1   < > 40.1 40.4 36.6 39.0  MCV 89.6  --  92.0 87.1 87.8 90.1  PLT 178   < > 166 182 192 215   < > = values in this interval not displayed.   Lab Results  Component Value Date   TSH 0.942 04/25/2022   No results found for: "HGBA1C" Lab Results  Component Value Date   CHOL 235 (A) 07/20/2020   HDL 100 (A) 07/20/2020   LDLCALC 134 07/20/2020   TRIG 102 07/20/2020    Significant Diagnostic Results in last 30 days:  CT Angio Chest PE W/Cm &/Or Wo Cm  Result Date: 02/05/2023 CLINICAL DATA:  Cough, possible aspiration pneumonia EXAM: CT ANGIOGRAPHY CHEST WITH CONTRAST TECHNIQUE: Multidetector CT imaging of the chest was performed using the standard protocol during bolus administration of intravenous contrast. Multiplanar CT image reconstructions and MIPs were obtained to evaluate the vascular anatomy. RADIATION DOSE REDUCTION: This exam was performed according to the departmental dose-optimization program which includes automated exposure control, adjustment of the mA and/or kV according to patient size and/or use of iterative reconstruction technique. CONTRAST:  75mL OMNIPAQUE IOHEXOL 350 MG/ML SOLN COMPARISON:  Chest radiograph dated 02/04/2023 FINDINGS: Cardiovascular: Satisfactory opacification of the bilateral pulmonary arteries to the lobar level. No evidence of pulmonary embolism. Study is not tailored for evaluation of the thoracic aorta. Ascending thoracic aorta measures 4.0 cm. Atherosclerotic calcifications of the aortic arch. The heart is normal in size.  No pericardial effusion. Moderate three-vessel coronary atherosclerosis. Mediastinum/Nodes: No suspicious mediastinal lymphadenopathy. Left thyroid goiter, without discrete nodule. Lungs/Pleura: Left  lower lobe atelectasis/collapse with air bronchograms. Mild right lower lobe opacity, likely atelectasis. No suspicious pulmonary  nodules. No pleural effusion or pneumothorax. Upper Abdomen: Visualized upper abdomen is grossly unremarkable, noting vascular calcifications. Musculoskeletal: Visualized osseous structures are within normal limits. Review of the MIP images confirms the above findings. IMPRESSION: No evidence of pulmonary embolism. Left lower lobe atelectasis/collapse with air bronchograms. Differential considerations include atelectasis (favored) versus pneumonia. Ascending thoracic aorta measures 4.0 cm. Recommend annual imaging followup by CTA or MRA. This recommendation follows 2010 ACCF/AHA/AATS/ACR/ASA/SCA/SCAI/SIR/STS/SVM Guidelines for the Diagnosis and Management of Patients with Thoracic Aortic Disease. Circulation. 2010; 121: Z610-R604. Aortic aneurysm NOS (ICD10-I71.9) Aortic Atherosclerosis (ICD10-I70.0). Electronically Signed   By: Charline Bills M.D.   On: 02/05/2023 00:12   DG Chest Port 1 View  Result Date: 02/04/2023 CLINICAL DATA:  Cough EXAM: PORTABLE CHEST 1 VIEW COMPARISON:  Chest x-ray dated April 15, 2022 FINDINGS: Rightward patient rotation limits evaluation. Within limitations, cardiac and mediastinal contours are unchanged. New mild bibasilar opacities which are likely due to atelectasis. No evidence of pleural effusion or pneumothorax. IMPRESSION: New mild bibasilar opacities which are likely due to atelectasis. Electronically Signed   By: Allegra Lai M.D.   On: 02/04/2023 21:51    Assessment/Plan  1. Community acquired pneumonia of left lower lobe of lung Completed azithromycin and rocephin in the hospital Off oxygen Continues with mucus and cough Continue duonebs, mucinex, and pulmonary toilet.   2. Yeast UTI Completed 3 days of Diflucan No current symptoms.   3. Labile hypertension Discussed monitoring bp closely with nurse as she is on new meds  and has a hx of orthostatic hypotension. So far BP is WNL>   4. Weakness PT and OT eval and tx  5. Dementia without behavioral disturbance Has issues with aphasia, sundowning.  Working with ST for speech issues.   6. Recurrent UTI On macrodantin  Family/ staff Communication: Daughter Yetta Glassman  Labs/tests ordered:  CBC BMP 4/30

## 2023-02-15 ENCOUNTER — Encounter (HOSPITAL_COMMUNITY): Payer: Self-pay | Admitting: Emergency Medicine

## 2023-02-15 ENCOUNTER — Emergency Department (HOSPITAL_COMMUNITY)
Admission: EM | Admit: 2023-02-15 | Discharge: 2023-02-15 | Disposition: A | Discharge status: Skilled Nursing Facility | Payer: Medicare Other | Attending: Emergency Medicine | Admitting: Emergency Medicine

## 2023-02-15 ENCOUNTER — Emergency Department (HOSPITAL_COMMUNITY): Payer: Medicare Other

## 2023-02-15 ENCOUNTER — Telehealth: Payer: Self-pay | Admitting: Orthopedic Surgery

## 2023-02-15 ENCOUNTER — Other Ambulatory Visit: Payer: Self-pay

## 2023-02-15 DIAGNOSIS — L539 Erythematous condition, unspecified: Secondary | ICD-10-CM | POA: Diagnosis not present

## 2023-02-15 DIAGNOSIS — R062 Wheezing: Secondary | ICD-10-CM | POA: Insufficient documentation

## 2023-02-15 DIAGNOSIS — F0390 Unspecified dementia without behavioral disturbance: Secondary | ICD-10-CM | POA: Insufficient documentation

## 2023-02-15 DIAGNOSIS — Z1152 Encounter for screening for COVID-19: Secondary | ICD-10-CM | POA: Insufficient documentation

## 2023-02-15 DIAGNOSIS — E860 Dehydration: Secondary | ICD-10-CM | POA: Diagnosis not present

## 2023-02-15 DIAGNOSIS — R4182 Altered mental status, unspecified: Secondary | ICD-10-CM | POA: Diagnosis present

## 2023-02-15 LAB — CBC WITH DIFFERENTIAL/PLATELET
Abs Immature Granulocytes: 0.05 10*3/uL (ref 0.00–0.07)
Basophils Absolute: 0.1 10*3/uL (ref 0.0–0.1)
Basophils Relative: 2 %
Eosinophils Absolute: 0.1 10*3/uL (ref 0.0–0.5)
Eosinophils Relative: 2 %
HCT: 46.1 % — ABNORMAL HIGH (ref 36.0–46.0)
Hemoglobin: 14.2 g/dL (ref 12.0–15.0)
Immature Granulocytes: 1 %
Lymphocytes Relative: 21 %
Lymphs Abs: 1.2 10*3/uL (ref 0.7–4.0)
MCH: 28.7 pg (ref 26.0–34.0)
MCHC: 30.8 g/dL (ref 30.0–36.0)
MCV: 93.3 fL (ref 80.0–100.0)
Monocytes Absolute: 0.9 10*3/uL (ref 0.1–1.0)
Monocytes Relative: 16 %
Neutro Abs: 3.3 10*3/uL (ref 1.7–7.7)
Neutrophils Relative %: 58 %
Platelets: 251 10*3/uL (ref 150–400)
RBC: 4.94 MIL/uL (ref 3.87–5.11)
RDW: 12.9 % (ref 11.5–15.5)
WBC: 5.7 10*3/uL (ref 4.0–10.5)
nRBC: 0 % (ref 0.0–0.2)

## 2023-02-15 LAB — URINALYSIS, W/ REFLEX TO CULTURE (INFECTION SUSPECTED)
Bacteria, UA: NONE SEEN
Bilirubin Urine: NEGATIVE
Glucose, UA: NEGATIVE mg/dL
Hgb urine dipstick: NEGATIVE
Ketones, ur: NEGATIVE mg/dL
Leukocytes,Ua: NEGATIVE
Nitrite: NEGATIVE
Protein, ur: NEGATIVE mg/dL
Specific Gravity, Urine: 1.011 (ref 1.005–1.030)
pH: 6 (ref 5.0–8.0)

## 2023-02-15 LAB — PROTIME-INR
INR: 1 (ref 0.8–1.2)
Prothrombin Time: 13 seconds (ref 11.4–15.2)

## 2023-02-15 LAB — COMPREHENSIVE METABOLIC PANEL
ALT: 25 U/L (ref 0–44)
AST: 27 U/L (ref 15–41)
Albumin: 3.5 g/dL (ref 3.5–5.0)
Alkaline Phosphatase: 79 U/L (ref 38–126)
Anion gap: 11 (ref 5–15)
BUN: 16 mg/dL (ref 8–23)
CO2: 25 mmol/L (ref 22–32)
Calcium: 8.8 mg/dL — ABNORMAL LOW (ref 8.9–10.3)
Chloride: 100 mmol/L (ref 98–111)
Creatinine, Ser: 1.07 mg/dL — ABNORMAL HIGH (ref 0.44–1.00)
GFR, Estimated: 51 mL/min — ABNORMAL LOW (ref >60)
Glucose, Bld: 101 mg/dL — ABNORMAL HIGH (ref 70–99)
Potassium: 3.8 mmol/L (ref 3.5–5.1)
Sodium: 136 mmol/L (ref 135–145)
Total Bilirubin: 0.5 mg/dL (ref 0.3–1.2)
Total Protein: 7.4 g/dL (ref 6.5–8.1)

## 2023-02-15 LAB — SARS CORONAVIRUS 2 BY RT PCR: SARS Coronavirus 2 by RT PCR: NEGATIVE

## 2023-02-15 LAB — TROPONIN I (HIGH SENSITIVITY): Troponin I (High Sensitivity): 5 ng/L (ref <18)

## 2023-02-15 LAB — APTT: aPTT: 35 seconds (ref 24–36)

## 2023-02-15 LAB — LACTIC ACID, PLASMA: Lactic Acid, Venous: 1.3 mmol/L (ref 0.5–1.9)

## 2023-02-15 LAB — AMMONIA: Ammonia: 12 umol/L (ref 9–35)

## 2023-02-15 MED ORDER — IPRATROPIUM-ALBUTEROL 0.5-2.5 (3) MG/3ML IN SOLN
3.0000 mL | Freq: Once | RESPIRATORY_TRACT | Status: AC
  Administered 2023-02-15: 3 mL via RESPIRATORY_TRACT
  Filled 2023-02-15: qty 3

## 2023-02-15 MED ORDER — LACTATED RINGERS IV BOLUS
500.0000 mL | Freq: Once | INTRAVENOUS | Status: AC
  Administered 2023-02-15: 500 mL via INTRAVENOUS

## 2023-02-15 NOTE — ED Triage Notes (Signed)
Patient presents from [facility] due to intermittent lethargy which began yesterday. Last week she was seen and treated for pneumonia.     EMA vitals: 96% SPO2 on room air 70 HR 150/90 BP 112 CBG

## 2023-02-15 NOTE — Telephone Encounter (Signed)
[facility] nursing called to report increased lethargy and temp> 100 last night. H/o advanced dementia. She was recently hospitalized with pneumonia treated with rocephin and azithromycin. Advised to send to ED for further evaluation.

## 2023-02-15 NOTE — ED Provider Notes (Signed)
[facility] AT [facility] Provider Note   CSN: [id number] Arrival date & time: 02/15/23  1352     History  Chief Complaint  Patient presents with   Fatigue    Cheryl Monroe is a 88 y.o. female.  HPI 87-year female with a history of dementia, UTIs, and recent pneumonia presents with altered mental status.  History is primarily from the nurse at [facility].  She states that the patient was sitting at the nurse station and they thought she was taking a nap this morning around 10 AM.  However when her sons came to take her to lunch the patient was not able to be woken up.  She was having a "snoring/stridor sound".  They laid her flat but she still did not respond and did not respond to a sternal rub.  When they took her from the bed to the stretcher, she briefly opened her eyes but then went back unresponsive.  She ate breakfast like normal this morning.  Apparently when the nurse reviewed notes from yesterday the patient slept the entire 3-11 PM shift.  She was unable to be aroused at that time as well.  She recently had pneumonia and completed all antibiotics.  The nurse also noted that around 12:40 AM the patient was found to have a temperature of 102 and received Tylenol.  Her blood pressure also has been labile today, during this episode it was 173/73 and then despite no medications went down to 100/52.  Here, the patient is awake and able to talk to me.  She is normally conversant according to the nurse and is acting that way with me, responding that she has no complaints but is also disoriented to time, place, situation.   Home Medications Prior to Admission medications   Medication Sig Start Date End Date Taking? Authorizing Provider  acetaminophen (TYLENOL) 325 MG tablet Take 2 tablets (650 mg total) by mouth every 6 (six) hours as needed for mild pain (or Fever >/= 101). 02/11/23   Marlin Canary U, DO  amLODipine (NORVASC) 2.5 MG tablet Take 1 tablet  (2.5 mg total) by mouth daily. 02/12/23   Joseph Art, DO  Cranberry (THERACRAN PO) Take 250 mg by mouth 2 (two) times daily.    [provider]  guaiFENesin (MUCINEX) 600 MG 12 hr tablet Take 600 mg by mouth 2 (two) times daily.    [provider]  hydrALAZINE (APRESOLINE) 25 MG tablet Take 1 tablet (25 mg total) by mouth every 8 (eight) hours. 02/11/23   Joseph Art, DO  ipratropium-albuterol (DUONEB) 0.5-2.5 (3) MG/3ML SOLN Take 3 mLs by nebulization in the morning and at bedtime for 3 days. 02/04/23 02/13/23  Octavia Heir, NP  nitrofurantoin (MACRODANTIN) 50 MG capsule Take 50 mg by mouth 2 (two) times daily.    [provider]  polyethylene glycol (MIRALAX / GLYCOLAX) 17 g packet Take 17 g by mouth every other day.    [provider]  senna (SENOKOT) 8.6 MG TABS tablet Take 2 tablets by mouth at bedtime.    [provider]      Allergies    Sulfa antibiotics    Review of Systems   Review of Systems  Unable to perform ROS: Dementia    Physical Exam Updated Vital Signs BP (!) 117/50   Pulse 72   Temp 98.3 F (36.8 C) (Oral)   Resp (!) 28   SpO2 97%  Physical Exam Vitals and nursing  note reviewed.  Constitutional:      Appearance: She is well-developed.  HENT:     Head: Normocephalic and atraumatic.  Eyes:     Extraocular Movements: Extraocular movements intact.  Cardiovascular:     Rate and Rhythm: Normal rate and regular rhythm.     Heart sounds: Normal heart sounds.  Pulmonary:     Effort: Pulmonary effort is normal. Tachypnea present. No accessory muscle usage.     Breath sounds: Rhonchi present.  Abdominal:     General: There is no distension.     Palpations: Abdomen is soft.     Tenderness: There is no abdominal tenderness.    Musculoskeletal:     Cervical back: No rigidity.  Skin:    General: Skin is warm and dry.  Neurological:     Mental Status: She is alert. She is disoriented.     Comments: CN 3-12  grossly intact. Symmetric strength in all 4 extremities. Grossly normal sensation. Pleasantly confused     ED Results / Procedures / Treatments   Labs (all labs ordered are listed, but only abnormal results are displayed) Labs Reviewed  COMPREHENSIVE METABOLIC PANEL - Abnormal; Notable for the following components:      Result Value   Glucose, Bld 101 (*)    Creatinine, Ser 1.07 (*)    Calcium 8.8 (*)    GFR, Estimated 51 (*)    All other components within normal limits  CBC WITH DIFFERENTIAL/PLATELET - Abnormal; Notable for the following components:   HCT 46.1 (*)    All other components within normal limits  SARS CORONAVIRUS 2 BY RT PCR  CULTURE, BLOOD (ROUTINE X 2)  CULTURE, BLOOD (ROUTINE X 2)  AMMONIA  LACTIC ACID, PLASMA  PROTIME-INR  APTT  URINALYSIS, W/ REFLEX TO CULTURE (INFECTION SUSPECTED)  TROPONIN I (HIGH SENSITIVITY)    EKG EKG Interpretation  Date/Time:  Saturday February 15 2023 16:02:27 EDT Ventricular Rate:  77 PR Interval:  164 QRS Duration: 143 QT Interval:  426 QTC Calculation: 483 R Axis:   75 Text Interpretation: Sinus rhythm Right bundle branch block no significant change since 4/16 Confirmed by Pricilla Loveless [phone]) on 02/15/2023 5:20:30 PM  Radiology DG Chest 2 View  Result Date: 02/15/2023 CLINICAL DATA:  AMS EXAM: CHEST - 2 VIEW COMPARISON:  02/04/2023 FINDINGS: Left infrahilar and posterior lower lobe opacities, obscuring the left diaphragmatic leaflet. Right lung clear. Heart size and mediastinal contours are within normal limits. Aortic Atherosclerosis (ICD10-170.0). No effusion. Visualized bones unremarkable. IMPRESSION: Left lower lobe infiltrate or atelectasis. Electronically Signed   By: Corlis Leak M.D.   On: 02/15/2023 15:19    Procedures Procedures    Medications Ordered in ED Medications  lactated ringers bolus 500 mL (0 mLs Intravenous Stopped 02/15/23 1558)  ipratropium-albuterol (DUONEB) 0.5-2.5 (3) MG/3ML nebulizer solution 3  mL (3 mLs Nebulization Given 02/15/23 1531)    ED Course/ Medical Decision Making/ A&P                             Medical Decision Making Amount and/or Complexity of Data Reviewed External Data Reviewed: notes. Labs: ordered.    Details: Normal WBC, unremarkable urine, negative COVID test.  Normal troponin.  Mild bump in creatinine up to 1.07. Radiology: ordered and independent interpretation performed.    Details: Questionable pneumonia versus atelectasis. ECG/medicine tests: ordered and independent interpretation performed.    Details: No ischemia.  Risk Prescription drug management.  Patient presents with change in mental status.  I originally ordered a head CT though she is well-appearing on arrival here.  Discussed with daughter at the bedside who prefers to hold off on CT as the patient is back to normal.  I do think it be unlikely for her to have a serious CNS pathology such as head bleed that would cause her to have transient difficulty being woken up but now back to normal.  We did discuss the option of head CT but family prefers to defer.  I think this is reasonable.  Other workup is unremarkable including no sign of an obvious infection and she has been afebrile in the emergency department.  She was having a little bit of wheezing but it sounds like she is never gotten rid of the cough from pneumonia she had.  Questionable pneumonia on the x-ray today, though this could be atelectasis she has poor respiratory effort according to family and the pneumonia she had was left-sided last time.  We will hold off on antibiotics after discussion with family.  At this point, she is better after given a bolus of fluids and the daughter was able to get her to drink 32 ounces of a smoothie and feels like she is stable for discharge back to the facility.  Does not want a second troponin which I think is reasonable as ACS also seems unlikely to cause her symptoms today.  Will discharge back to her  facility with return precautions.        Final Clinical Impression(s) / ED Diagnoses Final diagnoses:  Dehydration    Rx / DC Orders ED Discharge Orders     None         Pricilla Loveless, MD 02/15/23 2128

## 2023-02-15 NOTE — ED Notes (Signed)
Bladder scan shows 197 mL

## 2023-02-17 ENCOUNTER — Non-Acute Institutional Stay (SKILLED_NURSING_FACILITY): Payer: Medicare Other | Admitting: Internal Medicine

## 2023-02-17 ENCOUNTER — Encounter: Payer: Self-pay | Admitting: Internal Medicine

## 2023-02-17 DIAGNOSIS — J690 Pneumonitis due to inhalation of food and vomit: Secondary | ICD-10-CM | POA: Diagnosis not present

## 2023-02-17 DIAGNOSIS — R2681 Unsteadiness on feet: Secondary | ICD-10-CM

## 2023-02-17 DIAGNOSIS — G301 Alzheimer's disease with late onset: Secondary | ICD-10-CM | POA: Diagnosis not present

## 2023-02-17 DIAGNOSIS — N390 Urinary tract infection, site not specified: Secondary | ICD-10-CM | POA: Diagnosis not present

## 2023-02-17 DIAGNOSIS — R0989 Other specified symptoms and signs involving the circulatory and respiratory systems: Secondary | ICD-10-CM | POA: Diagnosis not present

## 2023-02-17 DIAGNOSIS — F0280 Dementia in other diseases classified elsewhere without behavioral disturbance: Secondary | ICD-10-CM

## 2023-02-17 NOTE — Progress Notes (Unsigned)
Provider:   Location:  [facility] Room Number: 140A Place of Service:  SNF (31)  PCP: Mahlon Gammon, MD Patient Care Team: Mahlon Gammon, MD as PCP - General (Internal Medicine) Jamison Neighbor, MD (Urology)  Extended Emergency Contact Information Primary Emergency Contact: Heminger,[name] Address: [address]          [place], [place] [address] [place] Home Phone: [phone] Work Phone: [phone] Relation: Son Secondary Emergency Contact: Salehi,Prachi  [place] Mobile Phone: [phone] Relation: Daughter  Code Status: DNR Goals of Care: Advanced Directive information    02/17/2023    8:54 AM  Advanced Directives  Does Patient Have a Medical Advance Directive? Yes  Type of Advance Directive Living will;Out of facility DNR (pink MOST or yellow form)  Does patient want to make changes to medical advance directive? No - Patient declined      Chief Complaint  Patient presents with   New Admit To SNF    Patient is being seen for new admit to SNF.    HPI: Patient is a 88 y.o. female seen today for Readmit to SNF  Admitted in the hospital from 04/16-04/23 for Pneumonia  Lives in SNF in [place]   Patient has a history of Alzheimer's dementia with Aphasia, hypertension, orthostatic hypotension She also has history of recurrent UTIs and urinary incontinence  Also h/o Orthostatic BP with Wide variation SBP Can vary from 170-90   Patient was sitting in her wheelchair was noticed to have Episode of Choking ? On her own Mucus Started on Doxycyline but continued to have hypoxia so send to the hospital She was treated with Broad spectrum Antibiotics. CT scan LLL Pneumonia Vs Atelectasis  In the hospital she was Started on 2 new Meds for BP including Norvasc and Hydralazine Recommended Rehab Patient on the weekend had an episode here [facility] when nurses thought that she was having she was  taking a nap but then family came and she was Lethargic  She was sent to ED again but there patient was normal again.  Her blood work did not show any acute changes. Was diagnosed with Dehydration  and she was sent back .  This morning patient ate her breakfast and was sitting in her wheelchair with no issues Very pleasant following commands does have expressive aphasia and some cough. Past Medical History:  Diagnosis Date   Alzheimer disease (HCC)    Balance problem 07/19/2020   Constipation 07/19/2020   Dementia without behavioral disturbance (HCC) 07/19/2020   MMSE 21/30 07/21/20   Fall    Osteoarthritis    Pyelonephritis    Spinal stenosis 07/19/2020   Weakness of left lower extremity 07/19/2020   History reviewed. No pertinent surgical history.  reports that she has never smoked. She has never used smokeless tobacco. She reports that she does not currently use alcohol. She reports that she does not currently use drugs. Social History   Socioeconomic History   Marital status: Legally Separated    Spouse name: Not on file   Number of children: 3   Years of education: Not on file   Highest education level: Some college, no degree  Occupational History   Not on file  Tobacco Use   Smoking status: Never   Smokeless tobacco: Never  Vaping Use   Vaping Use: Never used  Substance and Sexual Activity   Alcohol use: Not Currently   Drug use: Not  Currently   Sexual activity: Not on file  Other Topics Concern   Not on file  Social History Narrative   Not on file   Social Determinants of Health   Financial Resource Strain: Not on file  Food Insecurity: Not on file  Transportation Needs: Not on file  Physical Activity: Not on file  Stress: Not on file  Social Connections: Not on file  Intimate Partner Violence: Not on file    Functional Status Survey:    Family History  Problem Relation Age of Onset   ALS Mother     Health Maintenance  Topic Date Due   COVID-19  Vaccine (5 - 2023-24 season) 03/05/2023 (Originally 10/21/2022)   INFLUENZA VACCINE  05/22/2023   Medicare Annual Wellness (AWV)  11/23/2023   DTaP/Tdap/Td (2 - Td or Tdap) 02/24/2031   Pneumonia Vaccine 10+ Years old  Completed   Zoster Vaccines- Shingrix  Completed   HPV VACCINES  Aged Out   DEXA SCAN  Discontinued    Allergies  Allergen Reactions   Sulfa Antibiotics Rash    Rash to trunk, legs, neck, scalp, and arms    Outpatient Encounter Medications as of 02/17/2023  Medication Sig   acetaminophen (TYLENOL) 325 MG tablet Take 2 tablets (650 mg total) by mouth every 6 (six) hours as needed for mild pain (or Fever >/= 101).   Acetaminophen 500 MG PACK Take by mouth.   amLODipine (NORVASC) 2.5 MG tablet Take 1 tablet (2.5 mg total) by mouth daily.   Cranberry (THERACRAN PO) Take 250 mg by mouth 2 (two) times daily.   guaiFENesin (MUCINEX) 600 MG 12 hr tablet Take 600 mg by mouth 2 (two) times daily.   hydrALAZINE (APRESOLINE) 25 MG tablet Take 1 tablet (25 mg total) by mouth every 8 (eight) hours.   nitrofurantoin (MACRODANTIN) 50 MG capsule Take 50 mg by mouth 2 (two) times daily.   polyethylene glycol (MIRALAX / GLYCOLAX) 17 g packet Take 17 g by mouth every other day.   senna (SENOKOT) 8.6 MG TABS tablet Take 2 tablets by mouth at bedtime.   ipratropium-albuterol (DUONEB) 0.5-2.5 (3) MG/3ML SOLN Take 3 mLs by nebulization in the morning and at bedtime for 3 days.   No facility-administered encounter medications on file as of 02/17/2023.    Review of Systems  Unable to perform ROS: Dementia    Vitals:   02/17/23 0845  BP: 115/67  Pulse: 70  Resp: 14  Temp: (!) 97.3 F (36.3 C)  TempSrc: Temporal  SpO2: 97%  Height: 5\' 2"  (1.575 m)   Body mass index is 28.24 kg/m. Physical Exam Vitals reviewed.  Constitutional:      Appearance: Normal appearance.  HENT:     Head: Normocephalic.     Nose: Nose normal.     Mouth/Throat:     Mouth: Mucous membranes are moist.      Pharynx: Oropharynx is clear.  Eyes:     Pupils: Pupils are equal, round, and reactive to light.  Cardiovascular:     Rate and Rhythm: Normal rate and regular rhythm.     Pulses: Normal pulses.     Heart sounds: Normal heart sounds. No murmur heard. Pulmonary:     Effort: Pulmonary effort is normal.     Breath sounds: Normal breath sounds.     Comments: Few Rales in the Bases Abdominal:     General: Abdomen is flat. Bowel sounds are normal.     Palpations: Abdomen is soft.  Musculoskeletal:  General: No swelling.     Cervical back: Neck supple.  Skin:    General: Skin is warm.     Comments: Back seen for and PU and there was no Issue  Neurological:     General: No focal deficit present.     Mental Status: She is alert.  Psychiatric:        Mood and Affect: Mood normal.        Thought Content: Thought content normal.     Labs reviewed: Basic Metabolic Panel: Recent Labs    02/08/23 0423 02/10/23 0348 02/15/23 1448  NA 136 138 136  K 3.3* 3.7 3.8  CL 104 101 100  CO2 25 25 25   GLUCOSE 116* 98 101*  BUN 13 6* 16  CREATININE 0.72 0.66 1.07*  CALCIUM 8.6* 8.7* 8.8*   Liver Function Tests: Recent Labs    02/03/23 0000 02/04/23 2205 02/15/23 1448  AST  --  24 27  ALT  --  19 25  ALKPHOS 96 82 79  BILITOT  --  0.9 0.5  PROT  --  6.0* 7.4  ALBUMIN 3.9 2.7* 3.5   No results for input(s): "LIPASE", "AMYLASE" in the last 8760 hours. Recent Labs    02/15/23 1448  AMMONIA 12   CBC: Recent Labs    04/25/22 1154 11/07/22 0000 02/04/23 2205 02/07/23 0504 02/08/23 0423 02/10/23 0348 02/15/23 1448  WBC 6.9   < > 6.5   < > 6.8 7.7 5.7  NEUTROABS 4.4  --  4.0  --   --   --  3.3  HGB 13.7   < > 12.8   < > 12.2 12.4 14.2  HCT 42.1   < > 40.1   < > 36.6 39.0 46.1*  MCV 89.6  --  92.0   < > 87.8 90.1 93.3  PLT 178   < > 166   < > 192 215 251   < > = values in this interval not displayed.   Cardiac Enzymes: No results for input(s): "CKTOTAL", "CKMB",  "CKMBINDEX", "TROPONINI" in the last 8760 hours. BNP: Invalid input(s): "POCBNP" No results found for: "HGBA1C" Lab Results  Component Value Date   TSH 0.942 04/25/2022   Lab Results  Component Value Date   VITAMINB12 479 07/20/2020   No results found for: "FOLATE" Lab Results  Component Value Date   IRON 60 12/[birth year]   TIBC 321 09-Jul-1935   FERRITIN 67.22 January 09, 1935    Imaging and Procedures obtained prior to SNF admission: DG Chest 2 View  Result Date: 02/15/2023 CLINICAL DATA:  AMS EXAM: CHEST - 2 VIEW COMPARISON:  02/04/2023 FINDINGS: Left infrahilar and posterior lower lobe opacities, obscuring the left diaphragmatic leaflet. Right lung clear. Heart size and mediastinal contours are within normal limits. Aortic Atherosclerosis (ICD10-170.0). No effusion. Visualized bones unremarkable. IMPRESSION: Left lower lobe infiltrate or atelectasis. Electronically Signed   By: Corlis Leak M.D.   On: 02/15/2023 15:19    Assessment/Plan 1. Aspiration pneumonia of left lower lobe, unspecified aspiration pneumonia type (HCC) Will Continue Duo Nebs BID for 3 days and Then PRN Doing much better  Working with ST On Regular Diet Her Aspiration was more sue to Mucus 2. Labile hypertension Will Discontinue New BP meds  She has h/o Very Labile BP Her BP here is running very low SBP in 110 Discontineu Norvasc and Hydralazine Restart Cozaar  Hydralazine 10 mg TID PRn  3. Late onset Alzheimer's dementia without behavioral disturbance [facility]) SNF appropriate MRI  showed 08/29/21 revealed no evidence of acute intracranial abnormality, Moderate chronic small vessel ischemic changes within the cerebral white matter. Aphasia Worse Continue SNF support  4. Recurrent UTI Macrodantin Cranberry and Estrace  5. Unstable gait Wheelchair Depednet Therpay     Family/ staff Communication:   Labs/tests ordered:

## 2023-02-18 LAB — HEPATIC FUNCTION PANEL
ALT: 25 U/L (ref 7–35)
AST: 28 (ref 13–35)
Alkaline Phosphatase: 88 (ref 25–125)
Bilirubin, Total: 0.3

## 2023-02-18 LAB — COMPREHENSIVE METABOLIC PANEL
Albumin: 3.8 (ref 3.5–5.0)
Calcium: 9.2 (ref 8.7–10.7)
Globulin: 2.9
eGFR: 89

## 2023-02-18 LAB — BASIC METABOLIC PANEL
BUN: 9 (ref 4–21)
CO2: 28 — AB (ref 13–22)
Chloride: 101 (ref 99–108)
Creatinine: 0.6 (ref 0.5–1.1)
Glucose: 109
Potassium: 4.5 mEq/L (ref 3.5–5.1)
Sodium: 139 (ref 137–147)

## 2023-02-18 LAB — CULTURE, BLOOD (ROUTINE X 2)

## 2023-02-19 LAB — CULTURE, BLOOD (ROUTINE X 2)

## 2023-02-20 LAB — CULTURE, BLOOD (ROUTINE X 2)
Culture: NO GROWTH
Special Requests: ADEQUATE

## 2023-03-20 ENCOUNTER — Non-Acute Institutional Stay (SKILLED_NURSING_FACILITY): Payer: Medicare Other | Admitting: Adult Health

## 2023-03-20 ENCOUNTER — Encounter: Payer: Self-pay | Admitting: Adult Health

## 2023-03-20 DIAGNOSIS — F0390 Unspecified dementia without behavioral disturbance: Secondary | ICD-10-CM

## 2023-03-20 DIAGNOSIS — K5901 Slow transit constipation: Secondary | ICD-10-CM

## 2023-03-20 DIAGNOSIS — I10 Essential (primary) hypertension: Secondary | ICD-10-CM

## 2023-03-20 DIAGNOSIS — M4800 Spinal stenosis, site unspecified: Secondary | ICD-10-CM | POA: Diagnosis not present

## 2023-03-20 DIAGNOSIS — N390 Urinary tract infection, site not specified: Secondary | ICD-10-CM

## 2023-03-20 NOTE — Progress Notes (Addendum)
Location:   [facility] Room Number: 140A Place of Service:  SNF [phone]) Provider:  Fletcher Anon, NP  Mahlon Gammon, MD  Patient Care Team: Mahlon Gammon, MD as PCP - General (Internal Medicine) Jamison Neighbor, MD (Urology)  Extended Emergency Contact Information Primary Emergency Contact: Broadfoot,[name] Address: [address]          [place], [place] [address] [place] Home Phone: [phone] Work Phone: [phone] Relation: Son Secondary Emergency Contact: Belen,Prachi  [place] Mobile Phone: [phone] Relation: Daughter  Code Status:  DNR Goals of care: Advanced Directive information    03/20/2023    1:48 PM  Advanced Directives  Does Patient Have a Medical Advance Directive? Yes  Type of Advance Directive Living will;Out of facility DNR (pink MOST or yellow form)  Does patient want to make changes to medical advance directive? No - Patient declined  Pre-existing out of facility DNR order (yellow form or pink MOST form) Yellow form placed in chart (order not valid for inpatient use)     Chief Complaint  Patient presents with   Medical Management of Chronic Issues    Routine follow up    HPI:  Pt is a 88 y.o. female seen today for medical management of chronic diseases.   She was admitted for pneumonia (possible aspiration)with hypoxia and weakness in April.  PMH significant for dementia, aphasia, recurrent UTI, orthostatic hypotension, and urinary retention and incontinence She is doing much better. Minimal residual cough and not on oxygen. No acute complaints for the visit.  She did work with therapy made small gains but still needs a stand lift at times for transfers.  She has also worked with speech therapy. No new choking issues. Currently on a regular diet.   BP is quite labile with highs and orthostatic hypotension historically. Recent numbers reviewed. PRN hydralazine has  not been needed.   Bps reviewed Blood Pressure: 130 / 63 mmHg  Blood Pressure: 134 / 71 mmHg  Blood Pressure: 157 / 82 mmHg   Blood Pressure: 164 / 72 mmHg  Blood Pressure: 156 / 80 mmHg  Blood Pressure: 133 / 66 mmHg  In regards to her dementia she has aphasia and memory loss. Has incontinence.  Needs assistance with ADLS. Can stand turn sit but also needs a lift for transfers at times.   Past Medical History:  Diagnosis Date   Alzheimer disease (HCC)    Balance problem 07/19/2020   Constipation 07/19/2020   Dementia without behavioral disturbance (HCC) 07/19/2020   MMSE 21/30 07/21/20   Fall    Osteoarthritis    Pyelonephritis    Spinal stenosis 07/19/2020   Weakness of left lower extremity 07/19/2020   History reviewed. No pertinent surgical history.  Allergies  Allergen Reactions   Sulfa Antibiotics Rash    Rash to trunk, legs, neck, scalp, and arms    Allergies as of 03/20/2023       Reactions   Sulfa Antibiotics Rash   Rash to trunk, legs, neck, scalp, and arms        Medication List        Accurate as of Mar 20, 2023  2:01 PM. If you have any questions, ask your nurse or doctor.          STOP taking these medications    ipratropium-albuterol 0.5-2.5 (3) MG/3ML Soln Commonly known as: DUONEB Stopped by: Fletcher Anon, NP  TAKE these medications    Acetaminophen 500 MG Pack Take by mouth.   acetaminophen 325 MG tablet Commonly known as: TYLENOL Take 2 tablets (650 mg total) by mouth every 6 (six) hours as needed for mild pain (or Fever >/= 101).   guaiFENesin 600 MG 12 hr tablet Commonly known as: MUCINEX Take 600 mg by mouth 2 (two) times daily.   hydrALAZINE 10 MG tablet Commonly known as: APRESOLINE Take 10 mg by mouth 3 (three) times daily as needed.   losartan 25 MG tablet Commonly known as: COZAAR Take 25 mg by mouth daily.   nitrofurantoin 50 MG capsule Commonly known as: MACRODANTIN Take 50 mg by mouth 2 (two) times  daily.   polyethylene glycol 17 g packet Commonly known as: MIRALAX / GLYCOLAX Take 17 g by mouth every other day.   senna 8.6 MG Tabs tablet Commonly known as: SENOKOT Take 2 tablets by mouth at bedtime.   THERACRAN PO Take 250 mg by mouth 2 (two) times daily.        Review of Systems  Constitutional:  Negative for activity change, appetite change, chills, diaphoresis, fatigue, fever and unexpected weight change.  HENT:  Negative for congestion.   Respiratory:  Negative for cough, shortness of breath and wheezing.   Cardiovascular:  Positive for leg swelling. Negative for chest pain and palpitations.  Gastrointestinal:  Negative for abdominal distention, abdominal pain, constipation and diarrhea.  Genitourinary:  Negative for difficulty urinating and dysuria.  Musculoskeletal:  Positive for back pain (low back) and gait problem. Negative for arthralgias, joint swelling and myalgias.  Neurological:  Negative for dizziness, tremors, seizures, syncope, facial asymmetry, speech difficulty, weakness, light-headedness, numbness and headaches.  Psychiatric/Behavioral:  Positive for confusion. Negative for agitation and behavioral problems.     Immunization History  Administered Date(s) Administered   Influenza, High Dose Seasonal PF 07/25/2021   Influenza-Unspecified 08/05/2018, 09/29/2018, 07/02/2019, 08/21/2019, 08/11/2020, 07/26/2022   Moderna Covid-19 Vaccine Bivalent Booster 50yrs & up 08/01/2021, 08/26/2022, 02/13/2023   Moderna SARS-COV2 Booster Vaccination 08/31/2020, 06/12/2021   Moderna Sars-Covid-2 Vaccination 11/04/2019, 12/02/2019   Pneumococcal Conjugate-13 09/28/2018   Pneumococcal Polysaccharide-23 10/21/2017   Tdap 02/23/2021   Zoster Recombinat (Shingrix) 05/24/2021, 07/24/2021   Pertinent  Health Maintenance Due  Topic Date Due   INFLUENZA VACCINE  05/22/2023   DEXA SCAN  Discontinued      06/03/2022    3:33 PM 10/01/2022    1:09 PM 11/05/2022   10:43 AM  12/05/2022    1:51 PM 02/17/2023    8:50 AM  Fall Risk  Falls in the past year?  0 0 0 0  Was there an injury with Fall?  0 0 0 0  Fall Risk Category Calculator  0 0 0 0  Fall Risk Category (Retired)  Low     (RETIRED) Patient Fall Risk Level High fall risk High fall risk     Patient at Risk for Falls Due to  History of fall(s) History of fall(s) History of fall(s) No Fall Risks  Fall risk Follow up  Falls evaluation completed Falls evaluation completed Falls evaluation completed Falls evaluation completed   Functional Status Survey:    Vitals:   03/20/23 1332  BP: 130/63  Pulse: 69  Resp: 17  Temp: (!) 96.8 F (36 C)  SpO2: 96%  Weight: 152 lb 9.6 oz (69.2 kg)  Height: 5\' 2"  (1.575 m)   Body mass index is 27.91 kg/m. Physical Exam Vitals and nursing note reviewed.  Constitutional:  General: She is not in acute distress.    Appearance: She is not diaphoretic.  HENT:     Head: Normocephalic and atraumatic.  Neck:     Vascular: No JVD.  Cardiovascular:     Rate and Rhythm: Normal rate and regular rhythm.     Heart sounds: No murmur heard. Pulmonary:     Effort: Pulmonary effort is normal. No respiratory distress.     Breath sounds: Normal breath sounds. No wheezing.  Abdominal:     General: Bowel sounds are normal. There is no distension.     Palpations: Abdomen is soft.     Tenderness: There is no abdominal tenderness.  Musculoskeletal:     Comments: BLE edema +1  Skin:    General: Skin is warm and dry.  Neurological:     General: No focal deficit present.     Mental Status: She is alert. Mental status is at baseline.  Psychiatric:        Mood and Affect: Mood normal.     Labs reviewed: Recent Labs    02/08/23 0423 02/10/23 0348 02/15/23 1448 02/18/23 0000  NA 136 138 136 139  K 3.3* 3.7 3.8 4.5  CL 104 101 100 101  CO2 25 25 25  28*  GLUCOSE 116* 98 101*  --   BUN 13 6* 16 9  CREATININE 0.72 0.66 1.07* 0.6  CALCIUM 8.6* 8.7* 8.8* 9.2    Recent Labs    02/04/23 2205 02/15/23 1448 02/18/23 0000  AST 24 27 28   ALT 19 25 25   ALKPHOS 82 79 88  BILITOT 0.9 0.5  --   PROT 6.0* 7.4  --   ALBUMIN 2.7* 3.5 3.8   Recent Labs    04/25/22 1154 11/07/22 0000 02/04/23 2205 02/07/23 0504 02/08/23 0423 02/10/23 0348 02/15/23 1448  WBC 6.9   < > 6.5   < > 6.8 7.7 5.7  NEUTROABS 4.4  --  4.0  --   --   --  3.3  HGB 13.7   < > 12.8   < > 12.2 12.4 14.2  HCT 42.1   < > 40.1   < > 36.6 39.0 46.1*  MCV 89.6  --  92.0   < > 87.8 90.1 93.3  PLT 178   < > 166   < > 192 215 251   < > = values in this interval not displayed.   Lab Results  Component Value Date   TSH 0.942 04/25/2022   No results found for: "HGBA1C" Lab Results  Component Value Date   CHOL 235 (A) 07/20/2020   HDL 100 (A) 07/20/2020   LDLCALC 134 07/20/2020   TRIG 102 07/20/2020    Significant Diagnostic Results in last 30 days:  No results found.  Assessment/Plan  1. Essential hypertension Controlled on low dose losartan  2. Dementia without behavioral disturbance (HCC) Progressive decline in cognition and physical function c/w the disease. Continue supportive care in the skilled environment.  3. Recurrent UTI Continue cranberry and macrodantin   4. Spinal stenosis, unspecified spinal region No issue Takes tylenol daily   5. Slow transit constipation Continue miralax and senokot.    Family/ staff Communication: nurse  Labs/tests ordered:  NA

## 2023-03-21 ENCOUNTER — Encounter: Payer: Self-pay | Admitting: Adult Health

## 2023-04-07 ENCOUNTER — Non-Acute Institutional Stay (SKILLED_NURSING_FACILITY): Payer: Medicare Other | Admitting: Internal Medicine

## 2023-04-07 ENCOUNTER — Encounter: Payer: Self-pay | Admitting: Internal Medicine

## 2023-04-07 DIAGNOSIS — N390 Urinary tract infection, site not specified: Secondary | ICD-10-CM | POA: Diagnosis not present

## 2023-04-07 DIAGNOSIS — I10 Essential (primary) hypertension: Secondary | ICD-10-CM | POA: Diagnosis not present

## 2023-04-07 DIAGNOSIS — R2681 Unsteadiness on feet: Secondary | ICD-10-CM | POA: Diagnosis not present

## 2023-04-07 DIAGNOSIS — F0280 Dementia in other diseases classified elsewhere without behavioral disturbance: Secondary | ICD-10-CM

## 2023-04-07 DIAGNOSIS — G301 Alzheimer's disease with late onset: Secondary | ICD-10-CM

## 2023-04-07 NOTE — Progress Notes (Unsigned)
Location:  [facility] Room Number: 140A Place of Service:  SNF [phone]) Provider:  Mahlon Gammon, MD   Mahlon Gammon, MD  Patient Care Team: Mahlon Gammon, MD as PCP - General (Internal Medicine) Jamison Neighbor, MD (Urology)  Extended Emergency Contact Information Primary Emergency Contact: Geil,[name] Address: [address]          [place], [place] [address] [place] Home Phone: [phone] Work Phone: [phone] Relation: Son Secondary Emergency Contact: Hurta,Prachi  [place] Mobile Phone: [phone] Relation: Daughter  Code Status:  DNR  Goals of care: Advanced Directive information    04/07/2023   12:44 PM  Advanced Directives  Does Patient Have a Medical Advance Directive? Yes  Type of Advance Directive Living will;Out of facility DNR (pink MOST or yellow form)  Does patient want to make changes to medical advance directive? No - Patient declined  Pre-existing out of facility DNR order (yellow form or pink MOST form) Yellow form placed in chart (order not valid for inpatient use)     Chief Complaint  Patient presents with   Medical Management of Chronic Issues    Patient is being seen for a routine visit     HPI:  Pt is a 88 y.o. female seen today for medical management of chronic diseases.   Lives in SNF in [place]   Patient has a history of Alzheimer's dementia with Aphasia, hypertension, orthostatic hypotension She also has history of recurrent UTIs and urinary incontinence  Also h/o Orthostatic BP with Wide variation SBP Can vary from 170-90  Past Medical History:  Diagnosis Date   Alzheimer disease (HCC)    Balance problem 07/19/2020   Constipation 07/19/2020   Dementia without behavioral disturbance (HCC) 07/19/2020   MMSE 21/30 07/21/20   Fall    Osteoarthritis    Pyelonephritis    Spinal stenosis 07/19/2020   Weakness of left lower extremity 07/19/2020   History  reviewed. No pertinent surgical history.  Allergies  Allergen Reactions   Sulfa Antibiotics Rash    Rash to trunk, legs, neck, scalp, and arms    Outpatient Encounter Medications as of 04/07/2023  Medication Sig   acetaminophen (TYLENOL) 325 MG tablet Take 2 tablets (650 mg total) by mouth every 6 (six) hours as needed for mild pain (or Fever >/= 101).   Acetaminophen 500 MG PACK Take by mouth.   Cranberry (THERACRAN PO) Take 250 mg by mouth 2 (two) times daily.   guaiFENesin (MUCINEX) 600 MG 12 hr tablet Take 600 mg by mouth 2 (two) times daily.   hydrALAZINE (APRESOLINE) 10 MG tablet Take 10 mg by mouth 3 (three) times daily as needed.   losartan (COZAAR) 25 MG tablet Take 25 mg by mouth daily.   nitrofurantoin (MACRODANTIN) 50 MG capsule Take 50 mg by mouth 2 (two) times daily.   polyethylene glycol (MIRALAX / GLYCOLAX) 17 g packet Take 17 g by mouth every other day.   senna (SENOKOT) 8.6 MG TABS tablet Take 2 tablets by mouth at bedtime.   No facility-administered encounter medications on file as of 04/07/2023.    Review of Systems  Immunization History  Administered Date(s) Administered   Influenza, High Dose Seasonal PF 07/25/2021   Influenza-Unspecified 08/05/2018, 09/29/2018, 07/02/2019, 08/21/2019, 08/11/2020, 07/26/2022   Moderna Covid-19 Vaccine Bivalent Booster 70yrs & up 08/01/2021, 08/26/2022, 02/13/2023   Moderna SARS-COV2 Booster Vaccination 08/31/2020, 06/12/2021   Moderna Sars-Covid-2 Vaccination 11/04/2019, 12/02/2019  Pneumococcal Conjugate-13 09/28/2018   Pneumococcal Polysaccharide-23 10/21/2017   Tdap 02/23/2021   Zoster Recombinat (Shingrix) 05/24/2021, 07/24/2021   Pertinent  Health Maintenance Due  Topic Date Due   INFLUENZA VACCINE  05/22/2023   DEXA SCAN  Discontinued      06/03/2022    3:33 PM 10/01/2022    1:09 PM 11/05/2022   10:43 AM 12/05/2022    1:51 PM 02/17/2023    8:50 AM  Fall Risk  Falls in the past year?  0 0 0 0  Was there an  injury with Fall?  0 0 0 0  Fall Risk Category Calculator  0 0 0 0  Fall Risk Category (Retired)  Low     (RETIRED) Patient Fall Risk Level High fall risk High fall risk     Patient at Risk for Falls Due to  History of fall(s) History of fall(s) History of fall(s) No Fall Risks  Fall risk Follow up  Falls evaluation completed Falls evaluation completed Falls evaluation completed Falls evaluation completed   Functional Status Survey:    Vitals:   04/07/23 1241  BP: 134/63  Pulse: 73  Resp: 16  Temp: (!) 97.5 F (36.4 C)  TempSrc: Temporal  SpO2: 94%  Weight: 154 lb 11.2 oz (70.2 kg)  Height: 5\' 2"  (1.575 m)   Body mass index is 28.3 kg/m. Physical Exam  Labs reviewed: Recent Labs    02/08/23 0423 02/10/23 0348 02/15/23 1448 02/18/23 0000  NA 136 138 136 139  K 3.3* 3.7 3.8 4.5  CL 104 101 100 101  CO2 25 25 25  28*  GLUCOSE 116* 98 101*  --   BUN 13 6* 16 9  CREATININE 0.72 0.66 1.07* 0.6  CALCIUM 8.6* 8.7* 8.8* 9.2   Recent Labs    02/04/23 2205 02/15/23 1448 02/18/23 0000  AST 24 27 28   ALT 19 25 25   ALKPHOS 82 79 88  BILITOT 0.9 0.5  --   PROT 6.0* 7.4  --   ALBUMIN 2.7* 3.5 3.8   Recent Labs    04/25/22 1154 11/07/22 0000 02/04/23 2205 02/07/23 0504 02/08/23 0423 02/10/23 0348 02/15/23 1448  WBC 6.9   < > 6.5   < > 6.8 7.7 5.7  NEUTROABS 4.4  --  4.0  --   --   --  3.3  HGB 13.7   < > 12.8   < > 12.2 12.4 14.2  HCT 42.1   < > 40.1   < > 36.6 39.0 46.1*  MCV 89.6  --  92.0   < > 87.8 90.1 93.3  PLT 178   < > 166   < > 192 215 251   < > = values in this interval not displayed.   Lab Results  Component Value Date   TSH 0.942 04/25/2022   No results found for: "HGBA1C" Lab Results  Component Value Date   CHOL 235 (A) 07/20/2020   HDL 100 (A) 07/20/2020   LDLCALC 134 07/20/2020   TRIG 102 07/20/2020    Significant Diagnostic Results in last 30 days:  No results found.  Assessment/Plan There are no diagnoses linked to this  encounter.   Family/ staff Communication: ***  Labs/tests ordered:  ***

## 2023-05-06 ENCOUNTER — Non-Acute Institutional Stay (SKILLED_NURSING_FACILITY): Payer: Medicare Other | Admitting: Orthopedic Surgery

## 2023-05-06 ENCOUNTER — Encounter: Payer: Self-pay | Admitting: Orthopedic Surgery

## 2023-05-06 DIAGNOSIS — I10 Essential (primary) hypertension: Secondary | ICD-10-CM

## 2023-05-06 DIAGNOSIS — K5901 Slow transit constipation: Secondary | ICD-10-CM

## 2023-05-06 DIAGNOSIS — F0390 Unspecified dementia without behavioral disturbance: Secondary | ICD-10-CM

## 2023-05-06 DIAGNOSIS — R2681 Unsteadiness on feet: Secondary | ICD-10-CM | POA: Diagnosis not present

## 2023-05-06 DIAGNOSIS — N390 Urinary tract infection, site not specified: Secondary | ICD-10-CM | POA: Diagnosis not present

## 2023-05-06 NOTE — Progress Notes (Signed)
[facility] Room Number: 140-A Place of Service:  SNF (31) Provider:  Hazle Nordmann, NP  WGN:FAOZH, Freddie Breech, MD  Patient Care Team: Mahlon Gammon, MD as PCP - General (Internal Medicine) Jamison Neighbor, MD (Urology)  Extended Emergency Contact Information Primary Emergency Contact: Pigman,[name] Address: [address]          [place], [place] [address] [place] of [place] Home Phone: [phone] Work Phone: [phone] Relation: Son Secondary Emergency Contact: Douse,Prachi  [place] Mobile Phone: [phone] Relation: Daughter  Code Status:  DNR Goals of care: Advanced Directive information    05/06/2023   10:02 AM  Advanced Directives  Does Patient Have a Medical Advance Directive? Yes  Type of Advance Directive Living will;Out of facility DNR (pink MOST or yellow form)  Does patient want to make changes to medical advance directive? No - Patient declined     Chief Complaint  Patient presents with   Medical Management of Chronic Issues    Routine Visit.    Immunizations    Discuss the need for [employer].     HPI:  Pt is a 88 y.o. female seen today for medical management of chronic diseases.    She currently resides on the skilled nursing unit at [facility] due to Alzheimer's dementia. Past medical history includes: HTN (SBP > 170), orthostatic bp, PNA with hospitalization 01/2023, recurrent UTI, constipation, neurogenic bladder and spinal stenosis.  HTN- BUN/creat 9/0.6 01/2023, see trends below, remains on losartan and hydralazine prn for SBP> 180 Unstable gait- ambulates with wheelchair, transfers with sit/stand lift Alzheimer's- MMSE 21/30, MRI brain noted moderate chronic ischemic changes in cerebral white matter, no behaviors, aphasia, will refuse medications at times, not on medication Recurrent UTI- remains on nitrofurantoin and cranberry supplement Constipation- LBM  07/15, remains on miralax, senna and colace  Recent blood pressures:  07/12- 117/76  07/10- 119/73  07/08- 136/88  Recent weights:  07/01- 156.8 lbs  06/01- 154.7 lbs  05/01- 152.6 lbs    Past Medical History:  Diagnosis Date   Alzheimer disease (HCC)    Balance problem 07/19/2020   Constipation 07/19/2020   Dementia without behavioral disturbance (HCC) 07/19/2020   MMSE 21/30 07/21/20   Fall    Osteoarthritis    Pyelonephritis    Spinal stenosis 07/19/2020   Weakness of left lower extremity 07/19/2020   History reviewed. No pertinent surgical history.  Allergies  Allergen Reactions   Sulfa Antibiotics Rash    Rash to trunk, legs, neck, scalp, and arms    Allergies as of 05/06/2023       Reactions   Sulfa Antibiotics Rash   Rash to trunk, legs, neck, scalp, and arms        Medication List        Accurate as of May 06, 2023 10:02 AM. If you have any questions, ask your nurse or doctor.          STOP taking these medications    guaiFENesin 600 MG 12 hr tablet Commonly known as: MUCINEX Stopped by: Octavia Heir       TAKE these medications    Acetaminophen 500 MG Pack Take by mouth. What changed: Another medication with the same name was removed. Continue taking this medication, and follow the directions you see here. Changed by: Octavia Heir   hydrALAZINE 10 MG tablet Commonly known as: APRESOLINE Take 10 mg by mouth every  8 (eight) hours as needed.   losartan 25 MG tablet Commonly known as: COZAAR Take 25 mg by mouth daily.   nitrofurantoin 50 MG capsule Commonly known as: MACRODANTIN Take 50 mg by mouth 2 (two) times daily.   polyethylene glycol 17 g packet Commonly known as: MIRALAX / GLYCOLAX Take 17 g by mouth every other day.   senna 8.6 MG Tabs tablet Commonly known as: SENOKOT Take 2 tablets by mouth at bedtime.   THERACRAN PO Take 250 mg by mouth 2 (two) times daily.        Review of Systems  Unable to perform ROS:  Dementia    Immunization History  Administered Date(s) Administered   Influenza, High Dose Seasonal PF 07/25/2021   Influenza-Unspecified 08/05/2018, 09/29/2018, 07/02/2019, 08/21/2019, 08/11/2020, 07/26/2022   Moderna Covid-19 Vaccine Bivalent Booster 21yrs & up 08/01/2021, 08/26/2022, 02/13/2023   Moderna SARS-COV2 Booster Vaccination 08/31/2020, 06/12/2021   Moderna Sars-Covid-2 Vaccination 11/04/2019, 12/02/2019   Pneumococcal Conjugate-13 09/28/2018   Pneumococcal Polysaccharide-23 10/21/2017   Tdap 02/23/2021   Zoster Recombinant(Shingrix) 05/24/2021, 07/24/2021   Pertinent  Health Maintenance Due  Topic Date Due   INFLUENZA VACCINE  05/22/2023   DEXA SCAN  Discontinued      06/03/2022    3:33 PM 10/01/2022    1:09 PM 11/05/2022   10:43 AM 12/05/2022    1:51 PM 02/17/2023    8:50 AM  Fall Risk  Falls in the past year?  0 0 0 0  Was there an injury with Fall?  0 0 0 0  Fall Risk Category Calculator  0 0 0 0  Fall Risk Category (Retired)  Low     (RETIRED) Patient Fall Risk Level High fall risk High fall risk     Patient at Risk for Falls Due to  History of fall(s) History of fall(s) History of fall(s) No Fall Risks  Fall risk Follow up  Falls evaluation completed Falls evaluation completed Falls evaluation completed Falls evaluation completed   Functional Status Survey:    Vitals:   05/06/23 0959  BP: 117/76  Pulse: 76  Resp: 18  Temp: (!) 97.2 F (36.2 C)  SpO2: 99%  Weight: 156 lb 12.8 oz (71.1 kg)  Height: 5\' 2"  (1.575 m)   Body mass index is 28.68 kg/m. Physical Exam Vitals reviewed.  Constitutional:      General: She is not in acute distress. HENT:     Head: Normocephalic.     Right Ear: There is impacted cerumen.     Left Ear: There is no impacted cerumen.     Nose: Nose normal.     Mouth/Throat:     Mouth: Mucous membranes are moist.  Eyes:     General:        Right eye: No discharge.        Left eye: No discharge.  Cardiovascular:     Rate  and Rhythm: Normal rate and regular rhythm.     Pulses: Normal pulses.     Heart sounds: Normal heart sounds.  Pulmonary:     Effort: Pulmonary effort is normal. No respiratory distress.     Breath sounds: Normal breath sounds. No wheezing.  Abdominal:     General: Bowel sounds are normal. There is no distension.     Palpations: Abdomen is soft.     Tenderness: There is no abdominal tenderness.  Musculoskeletal:     Cervical back: Neck supple.     Right lower leg: No edema.  Left lower leg: No edema.  Skin:    General: Skin is warm.     Capillary Refill: Capillary refill takes less than 2 seconds.     Comments: Small scab to nose, no sign of infection  Neurological:     General: No focal deficit present.     Mental Status: She is alert. Mental status is at baseline.     Motor: Weakness present.     Gait: Gait abnormal.  Psychiatric:        Mood and Affect: Mood normal.     Comments: Very pleasant, aphasia, follows some commands, alert to self/familiar face     Labs reviewed: Recent Labs    02/08/23 0423 02/10/23 0348 02/15/23 1448 02/18/23 0000  NA 136 138 136 139  K 3.3* 3.7 3.8 4.5  CL 104 101 100 101  CO2 25 25 25  28*  GLUCOSE 116* 98 101*  --   BUN 13 6* 16 9  CREATININE 0.72 0.66 1.07* 0.6  CALCIUM 8.6* 8.7* 8.8* 9.2   Recent Labs    02/04/23 2205 02/15/23 1448 02/18/23 0000  AST 24 27 28   ALT 19 25 25   ALKPHOS 82 79 88  BILITOT 0.9 0.5  --   PROT 6.0* 7.4  --   ALBUMIN 2.7* 3.5 3.8   Recent Labs    02/04/23 2205 02/07/23 0504 02/08/23 0423 02/10/23 0348 02/15/23 1448  WBC 6.5   < > 6.8 7.7 5.7  NEUTROABS 4.0  --   --   --  3.3  HGB 12.8   < > 12.2 12.4 14.2  HCT 40.1   < > 36.6 39.0 46.1*  MCV 92.0   < > 87.8 90.1 93.3  PLT 166   < > 192 215 251   < > = values in this interval not displayed.   Lab Results  Component Value Date   TSH 0.942 04/25/2022   No results found for: "HGBA1C" Lab Results  Component Value Date   CHOL 235 (A)  07/20/2020   HDL 100 (A) 07/20/2020   LDLCALC 134 07/20/2020   TRIG 102 07/20/2020    Significant Diagnostic Results in last 30 days:  No results found.  Assessment/Plan 1. Essential hypertension - controlled - cont losartan - cont hydralazine prn for SBP > 180  2. Unstable gait - ambulates with wheelchair - uses sit/stand lift for transfers - cont skilled nursing  3. Dementia without behavioral disturbance (HCC) - no behaviors - sometimes refuses medications - weights stable - not on medication  4. Recurrent UTI - cont nitrofurantoin and cranberry supplement  5. Slow transit constipation - LBM 07/15 - abdomen soft - cont senna and miralax    Family/ staff Communication: plan discussed with patient and nurse  Labs/tests ordered: none

## 2023-05-31 ENCOUNTER — Other Ambulatory Visit: Payer: Self-pay

## 2023-05-31 ENCOUNTER — Encounter (HOSPITAL_COMMUNITY): Payer: Self-pay

## 2023-05-31 ENCOUNTER — Observation Stay (HOSPITAL_COMMUNITY): Payer: Medicare Other

## 2023-05-31 ENCOUNTER — Emergency Department (HOSPITAL_COMMUNITY): Payer: Medicare Other

## 2023-05-31 ENCOUNTER — Inpatient Hospital Stay (HOSPITAL_COMMUNITY)
Admission: EM | Admit: 2023-05-31 | Discharge: 2023-06-05 | DRG: 757 | Disposition: A | Discharge status: Nursing Home (Includes ICF) | Payer: Medicare Other | Attending: Family Medicine | Admitting: Family Medicine

## 2023-05-31 DIAGNOSIS — F02811 Dementia in other diseases classified elsewhere, unspecified severity, with agitation: Secondary | ICD-10-CM | POA: Diagnosis present

## 2023-05-31 DIAGNOSIS — G9341 Metabolic encephalopathy: Secondary | ICD-10-CM | POA: Diagnosis present

## 2023-05-31 DIAGNOSIS — F05 Delirium due to known physiological condition: Secondary | ICD-10-CM | POA: Diagnosis not present

## 2023-05-31 DIAGNOSIS — I160 Hypertensive urgency: Secondary | ICD-10-CM | POA: Diagnosis present

## 2023-05-31 DIAGNOSIS — R32 Unspecified urinary incontinence: Secondary | ICD-10-CM | POA: Diagnosis present

## 2023-05-31 DIAGNOSIS — N390 Urinary tract infection, site not specified: Secondary | ICD-10-CM | POA: Diagnosis not present

## 2023-05-31 DIAGNOSIS — Z79899 Other long term (current) drug therapy: Secondary | ICD-10-CM

## 2023-05-31 DIAGNOSIS — I10 Essential (primary) hypertension: Secondary | ICD-10-CM | POA: Diagnosis present

## 2023-05-31 DIAGNOSIS — I951 Orthostatic hypotension: Secondary | ICD-10-CM | POA: Diagnosis present

## 2023-05-31 DIAGNOSIS — Z7401 Bed confinement status: Secondary | ICD-10-CM

## 2023-05-31 DIAGNOSIS — Z635 Disruption of family by separation and divorce: Secondary | ICD-10-CM

## 2023-05-31 DIAGNOSIS — G309 Alzheimer's disease, unspecified: Secondary | ICD-10-CM | POA: Diagnosis present

## 2023-05-31 DIAGNOSIS — B3749 Other urogenital candidiasis: Secondary | ICD-10-CM | POA: Diagnosis not present

## 2023-05-31 DIAGNOSIS — R4701 Aphasia: Secondary | ICD-10-CM | POA: Diagnosis present

## 2023-05-31 DIAGNOSIS — J189 Pneumonia, unspecified organism: Secondary | ICD-10-CM | POA: Diagnosis present

## 2023-05-31 DIAGNOSIS — G301 Alzheimer's disease with late onset: Secondary | ICD-10-CM | POA: Diagnosis present

## 2023-05-31 DIAGNOSIS — F0280 Dementia in other diseases classified elsewhere without behavioral disturbance: Secondary | ICD-10-CM | POA: Diagnosis present

## 2023-05-31 DIAGNOSIS — Z882 Allergy status to sulfonamides status: Secondary | ICD-10-CM

## 2023-05-31 DIAGNOSIS — M1990 Unspecified osteoarthritis, unspecified site: Secondary | ICD-10-CM | POA: Diagnosis present

## 2023-05-31 DIAGNOSIS — M7989 Other specified soft tissue disorders: Secondary | ICD-10-CM | POA: Diagnosis present

## 2023-05-31 DIAGNOSIS — Z66 Do not resuscitate: Secondary | ICD-10-CM | POA: Diagnosis present

## 2023-05-31 DIAGNOSIS — Z993 Dependence on wheelchair: Secondary | ICD-10-CM

## 2023-05-31 LAB — URINALYSIS, W/ REFLEX TO CULTURE (INFECTION SUSPECTED)
Bilirubin Urine: NEGATIVE
Glucose, UA: NEGATIVE mg/dL
Ketones, ur: NEGATIVE mg/dL
Nitrite: NEGATIVE
Protein, ur: NEGATIVE mg/dL
Specific Gravity, Urine: 1.016 (ref 1.005–1.030)
pH: 5 (ref 5.0–8.0)

## 2023-05-31 LAB — BRAIN NATRIURETIC PEPTIDE: B Natriuretic Peptide: 22.1 pg/mL (ref 0.0–100.0)

## 2023-05-31 LAB — CBC
HCT: 46 % (ref 36.0–46.0)
Hemoglobin: 14.3 g/dL (ref 12.0–15.0)
MCH: 27.6 pg (ref 26.0–34.0)
MCHC: 31.1 g/dL (ref 30.0–36.0)
MCV: 88.6 fL (ref 80.0–100.0)
Platelets: 211 10*3/uL (ref 150–400)
RBC: 5.19 MIL/uL — ABNORMAL HIGH (ref 3.87–5.11)
RDW: 13.4 % (ref 11.5–15.5)
WBC: 7.3 10*3/uL (ref 4.0–10.5)
nRBC: 0 % (ref 0.0–0.2)

## 2023-05-31 LAB — BASIC METABOLIC PANEL
Anion gap: 9 (ref 5–15)
BUN: 17 mg/dL (ref 8–23)
CO2: 28 mmol/L (ref 22–32)
Calcium: 9.4 mg/dL (ref 8.9–10.3)
Chloride: 100 mmol/L (ref 98–111)
Creatinine, Ser: 0.72 mg/dL (ref 0.44–1.00)
GFR, Estimated: >60 mL/min (ref >60)
Glucose, Bld: 95 mg/dL (ref 70–99)
Potassium: 4 mmol/L (ref 3.5–5.1)
Sodium: 137 mmol/L (ref 135–145)

## 2023-05-31 LAB — MAGNESIUM: Magnesium: 2.1 mg/dL (ref 1.7–2.4)

## 2023-05-31 MED ORDER — ACETAMINOPHEN 650 MG RE SUPP: 650.0000 mg | Freq: Four times a day (QID) | RECTAL | Status: DC | PRN

## 2023-05-31 MED ORDER — SODIUM CHLORIDE 0.9 % IV BOLUS
500.0000 mL | Freq: Once | INTRAVENOUS | Status: AC
  Administered 2023-05-31: 500 mL via INTRAVENOUS

## 2023-05-31 MED ORDER — ONDANSETRON HCL 4 MG/2ML IJ SOLN: 4.0000 mg | Freq: Four times a day (QID) | INTRAMUSCULAR | Status: DC | PRN

## 2023-05-31 MED ORDER — ACETAMINOPHEN 325 MG PO TABS
650.0000 mg | ORAL_TABLET | Freq: Four times a day (QID) | ORAL | Status: DC | PRN
  Administered 2023-06-02: 650 mg via ORAL
  Filled 2023-05-31: qty 2

## 2023-05-31 MED ORDER — LOSARTAN POTASSIUM 25 MG PO TABS
25.0000 mg | ORAL_TABLET | Freq: Every day | ORAL | Status: DC
  Administered 2023-06-01 – 2023-06-05 (×5): 25 mg via ORAL
  Filled 2023-05-31 (×6): qty 1

## 2023-05-31 MED ORDER — NITROGLYCERIN 2 % TD OINT
1.0000 [in_us] | TOPICAL_OINTMENT | Freq: Four times a day (QID) | TRANSDERMAL | Status: DC
  Administered 2023-05-31 – 2023-06-05 (×19): 1 [in_us] via TOPICAL
  Filled 2023-05-31: qty 30
  Filled 2023-05-31: qty 1
  Filled 2023-05-31: qty 30

## 2023-05-31 MED ORDER — SENNA 8.6 MG PO TABS
2.0000 | ORAL_TABLET | Freq: Every day | ORAL | Status: DC
  Administered 2023-05-31 – 2023-06-04 (×5): 17.2 mg via ORAL
  Filled 2023-05-31 (×5): qty 2

## 2023-05-31 MED ORDER — SODIUM CHLORIDE 0.9 % IV SOLN
1.0000 g | INTRAVENOUS | Status: DC
  Administered 2023-06-01 – 2023-06-02 (×2): 1 g via INTRAVENOUS
  Filled 2023-05-31 (×2): qty 10

## 2023-05-31 MED ORDER — SODIUM CHLORIDE 0.9 % IV SOLN: INTRAVENOUS | Status: AC

## 2023-05-31 MED ORDER — HYDRALAZINE HCL 10 MG PO TABS: 10.0000 mg | ORAL_TABLET | Freq: Three times a day (TID) | ORAL | Status: DC | PRN

## 2023-05-31 MED ORDER — HYDRALAZINE HCL 10 MG PO TABS
10.0000 mg | ORAL_TABLET | Freq: Once | ORAL | Status: AC
  Administered 2023-05-31: 10 mg via ORAL
  Filled 2023-05-31: qty 1

## 2023-05-31 MED ORDER — POLYETHYLENE GLYCOL 3350 17 G PO PACK
17.0000 g | PACK | ORAL | Status: DC
  Administered 2023-06-01 – 2023-06-03 (×2): 17 g via ORAL
  Filled 2023-05-31 (×2): qty 1

## 2023-05-31 MED ORDER — ENOXAPARIN SODIUM 40 MG/0.4ML IJ SOSY
40.0000 mg | PREFILLED_SYRINGE | INTRAMUSCULAR | Status: DC
  Administered 2023-06-01 – 2023-06-05 (×5): 40 mg via SUBCUTANEOUS
  Filled 2023-05-31 (×5): qty 0.4

## 2023-05-31 MED ORDER — ONDANSETRON HCL 4 MG PO TABS: 4.0000 mg | ORAL_TABLET | Freq: Four times a day (QID) | ORAL | Status: DC | PRN

## 2023-05-31 MED ORDER — SODIUM CHLORIDE 0.9 % IV SOLN
1.0000 g | Freq: Once | INTRAVENOUS | Status: AC
  Administered 2023-05-31: 1 g via INTRAVENOUS
  Filled 2023-05-31: qty 10

## 2023-05-31 NOTE — ED Notes (Signed)
ED TO INPATIENT HANDOFF REPORT  ED Nurse Name and Phone #: [phone]  S Name/Age/Gender Cheryl Monroe 88 y.o. female Room/Bed: 035C/035C  Code Status   Code Status: Prior  Home/SNF/Other Skilled nursing facility Patient oriented to: self Is this baseline? Yes   Triage Complete: Triage complete  Chief Complaint Acute UTI [N39.0]  Triage Note Her son wanted facility to send to hospital for leg and hand swelling. State she is a little less responsive at this time but as well has dementia.   Per EMS her slurred speech is baseline    Allergies Allergies  Allergen Reactions   Sulfa Antibiotics Rash    Rash to trunk, legs, neck, scalp, and arms    Level of Care/Admitting Diagnosis ED Disposition     ED Disposition  Admit   Condition  --   Comment  Hospital Area: [facility] [[id number]]  Level of Care: Med-Surg [16]  May place patient in observation at [facility] or [facility] if equivalent level of care is available:: Yes  Covid Evaluation: Asymptomatic - no recent exposure (last 10 days) testing not required  Diagnosis: Acute UTI [213086]  Admitting Physician: Conard Novak  Attending Physician: Conard Novak          B Medical/Surgery History Past Medical History:  Diagnosis Date   Alzheimer disease (HCC)    Balance problem 07/19/2020   Constipation 07/19/2020   Dementia without behavioral disturbance (HCC) 07/19/2020   MMSE 21/30 07/21/20   Fall    Osteoarthritis    Pyelonephritis    Spinal stenosis 07/19/2020   Weakness of left lower extremity 07/19/2020   History reviewed. No pertinent surgical history.   A IV Location/Drains/Wounds Patient Lines/Drains/Airways Status     Active Line/Drains/Airways     Name Placement date Placement time Site Days   Peripheral IV 05/31/23 20 G Right Antecubital 05/31/23  1505  Antecubital  less than 1            Intake/Output Last 24 hours No intake or output data in the 24  hours ending 05/31/23 1758  Labs/Imaging Results for orders placed or performed during the hospital encounter of 05/31/23 (from the past 48 hour(s))  Basic metabolic panel     Status: None   Collection Time: 05/31/23  3:06 PM  Result Value Ref Range   Sodium 137 135 - 145 mmol/L   Potassium 4.0 3.5 - 5.1 mmol/L   Chloride 100 98 - 111 mmol/L   CO2 28 22 - 32 mmol/L   Glucose, Bld 95 70 - 99 mg/dL    Comment: Glucose reference range applies only to samples taken after fasting for at least 8 hours.   BUN 17 8 - 23 mg/dL   Creatinine, Ser 5.78 0.44 - 1.00 mg/dL   Calcium 9.4 8.9 - 46.9 mg/dL   GFR, Estimated >62 >95 mL/min    Comment: (NOTE) Calculated using the CKD-EPI Creatinine Equation (2021)    Anion gap 9 5 - 15    Comment: Performed at [facility], [address], [place], [place] [address]  Magnesium     Status: None   Collection Time: 05/31/23  3:06 PM  Result Value Ref Range   Magnesium 2.1 1.7 - 2.4 mg/dL    Comment: Performed at [facility], [address] [place], [place] [address]  Brain natriuretic peptide (order ONLY if patient c/o SOB)     Status: None   Collection Time: 05/31/23  3:06 PM  Result Value Ref Range   B Natriuretic Peptide 22.1 0.0 - 100.0 pg/mL    Comment: Performed at [facility], [address] [place], [place] [address]  CBC     Status: Abnormal   Collection Time: 05/31/23  3:06 PM  Result Value Ref Range   WBC 7.3 4.0 - 10.5 K/uL   RBC 5.19 (H) 3.87 - 5.11 MIL/uL   Hemoglobin 14.3 12.0 - 15.0 g/dL   HCT 60.4 54.0 - 98.1 %   MCV 88.6 80.0 - 100.0 fL   MCH 27.6 26.0 - 34.0 pg   MCHC 31.1 30.0 - 36.0 g/dL   RDW 19.1 47.8 - 29.5 %   Platelets 211 150 - 400 K/uL   nRBC 0.0 0.0 - 0.2 %    Comment: Performed at [facility], [address] [place], [place] [address]  Urinalysis, w/ Reflex to Culture (Infection Suspected) -Urine, Catheterized     Status: Abnormal   Collection Time: 05/31/23  3:06 PM  Result Value  Ref Range   Specimen Source URINE, CLEAN CATCH    Color, Urine YELLOW YELLOW   APPearance HAZY (A) CLEAR   Specific Gravity, Urine 1.016 1.005 - 1.030   pH 5.0 5.0 - 8.0   Glucose, UA NEGATIVE NEGATIVE mg/dL   Hgb urine dipstick SMALL (A) NEGATIVE   Bilirubin Urine NEGATIVE NEGATIVE   Ketones, ur NEGATIVE NEGATIVE mg/dL   Protein, ur NEGATIVE NEGATIVE mg/dL   Nitrite NEGATIVE NEGATIVE   Leukocytes,Ua LARGE (A) NEGATIVE   RBC / HPF 11-20 0 - 5 RBC/hpf   WBC, UA 21-50 0 - 5 WBC/hpf    Comment:        Reflex urine culture not performed if WBC <=10, OR if Squamous epithelial cells >5. If Squamous epithelial cells >5 suggest recollection.    Bacteria, UA MANY (A) NONE SEEN   Squamous Epithelial / HPF 0-5 0 - 5 /HPF   Mucus PRESENT     Comment: Performed at [facility], [address], [place], [place] [address]   DG Chest Portable 1 View  Result Date: 05/31/2023 CLINICAL DATA:  Weakness EXAM: PORTABLE CHEST 1 VIEW COMPARISON:  02/15/2023 chest radiograph. FINDINGS: Stable cardiomediastinal silhouette with normal heart size. No pneumothorax. No pleural effusion. Streaky hazy opacity in the mid and lower lungs bilaterally. IMPRESSION: Streaky hazy opacity in the mid and lower lungs bilaterally, cannot exclude aspiration or pneumonia. Follow-up chest radiographs advised. Electronically Signed   By: Delbert Phenix M.D.   On: 05/31/2023 15:16    Pending Labs Unresulted Labs (From admission, onward)     Start     Ordered   05/31/23 1506  Urine Culture  Once,   R        05/31/23 1506            Vitals/Pain Today's Vitals   05/31/23 1700 05/31/23 1715 05/31/23 1730 05/31/23 1745  BP: (!) 202/90 (!) 213/94 (!) 226/91 (!) 226/93  Pulse:      Resp: 20 18 (!) 27 (!) 22  Temp:      TempSrc:      SpO2:      Weight:      Height:      PainSc:        Isolation Precautions No active isolations  Medications Medications  nitroGLYCERIN (NITROGLYN) 2 % ointment 1 inch (0  inches Topical Hold 05/31/23 1757)  hydrALAZINE (APRESOLINE) tablet 10 mg (has no administration  in time range)  cefTRIAXone (ROCEPHIN) 1 g in sodium chloride 0.9 % 100 mL IVPB (has no administration in time range)  sodium chloride 0.9 % bolus 500 mL (has no administration in time range)    Mobility non-ambulatory     Focused Assessments Cardiac Assessment Handoff:    No results found for: "CKTOTAL", "CKMB", "CKMBINDEX", "TROPONINI" No results found for: "DDIMER" Does the Patient currently have chest pain? No    R Recommendations: See Admitting Provider Note  Report given to:   Additional Notes: dementia at baseline left leg appears slightly larger then right both with swelling.  She is able to answer questions some are logically answered and some are hard to tell.  On arrival she was slurring some words.  Per EMS the facility reported that this was baseline.  Her son is an MD I believe here.  I have left his number on the packet from facility

## 2023-05-31 NOTE — ED Notes (Signed)
Son/POA Dr Gae Bon [phone] would like an update immediately

## 2023-05-31 NOTE — ED Triage Notes (Signed)
Per EMS her slurred speech is baseline

## 2023-05-31 NOTE — ED Provider Notes (Signed)
Care transferred to me.  Urine is consistent with UTI.  BNP is normal.  Discussed with patient's son who is a physician.  The leg swelling is not really different than baseline.  There was some concern about hand swelling though I do not appreciate this.  Seems like she was more altered today than normal and a little less responsive which happens with UTIs.  Urine is consistent with UTI and son states that she often needs IV antibiotics to help clear this.  Labs are unremarkable.  Will give a dose of IV Rocephin.  Is reasonable to watch her overnight.  She has had some progressive hypertension since being in the ED and he states she is sensitive to blood pressure meds.  However I do not think this is a stroke or acute CNS emergency.  Will give a dose of her oral hydralazine.  Discussed with Dr. Myriam Forehand for admission.   Pricilla Loveless, MD 05/31/23 [phone]

## 2023-05-31 NOTE — ED Provider Notes (Signed)
[place] EMERGENCY DEPARTMENT AT [facility] Provider Note   CSN: [id number] Arrival date & time: 05/31/23  1354     History  Chief Complaint  Patient presents with   Leg Swelling    Cheryl Monroe is a 88 y.o. female.  HPI   Patient has a history of Alzheimer's disease, osteoarthritis, dementia, spinal stenosis, lower extremity weakness.  Patient denies any complaints.  She states she is not feeling short of breath or weak.  She denies any fevers or chills.  Additional history was provided by the patient's son over the telephone.  Her son, Dr. Sherryll Burger reports that his mother is generally a poor historian because of her dementia.  He was notified by the nursing staff that she may be a little bit less responsive than usual.  In the past this has been associated with a UTI so they were concerned about that.  They also reported that she had some swelling in her extremities that was new for her.  She was sent to the ED for further evaluation.  Home Medications Prior to Admission medications   Medication Sig Start Date End Date Taking? Authorizing Provider  Acetaminophen 500 MG PACK Take by mouth.    [provider]  Cranberry (THERACRAN PO) Take 250 mg by mouth 2 (two) times daily.    [provider]  hydrALAZINE (APRESOLINE) 10 MG tablet Take 10 mg by mouth every 8 (eight) hours as needed.    [provider]  losartan (COZAAR) 25 MG tablet Take 25 mg by mouth daily.    [provider]  nitrofurantoin (MACRODANTIN) 50 MG capsule Take 50 mg by mouth 2 (two) times daily.    [provider]  polyethylene glycol (MIRALAX / GLYCOLAX) 17 g packet Take 17 g by mouth every other day.    [provider]  senna (SENOKOT) 8.6 MG TABS tablet Take 2 tablets by mouth at bedtime.    [provider]      Allergies    Sulfa antibiotics    Review of Systems   Review of Systems  Physical Exam Updated Vital Signs BP (!) 169/89    Pulse 63   Temp 97.6 F (36.4 C) (Oral)   Resp 17   Ht 1.575 m (5\' 2" )   Wt 71.1 kg   SpO2 100%   BMI 28.67 kg/m  Physical Exam Vitals and nursing note reviewed.  Constitutional:      General: She is not in acute distress.    Appearance: She is well-developed.  HENT:     Head: Normocephalic and atraumatic.     Right Ear: External ear normal.     Left Ear: External ear normal.  Eyes:     General: No scleral icterus.       Right eye: No discharge.        Left eye: No discharge.     Conjunctiva/sclera: Conjunctivae normal.  Neck:     Trachea: No tracheal deviation.  Cardiovascular:     Rate and Rhythm: Normal rate and regular rhythm.  Pulmonary:     Effort: Pulmonary effort is normal. No respiratory distress.     Breath sounds: Normal breath sounds. No stridor. No wheezing or rales.  Abdominal:     General: Bowel sounds are normal. There is no distension.     Palpations: Abdomen is soft.     Tenderness: There is no abdominal tenderness. There is no guarding or rebound.  Musculoskeletal:  General: No tenderness or deformity.     Cervical back: Neck supple.     Right lower leg: Edema present.     Left lower leg: Edema present.     Comments: Mild edema noted to upper extremity  Skin:    General: Skin is warm and dry.     Findings: No rash.  Neurological:     General: No focal deficit present.     Mental Status: She is alert.     Cranial Nerves: No cranial nerve deficit, dysarthria or facial asymmetry.     Sensory: No sensory deficit.     Motor: No abnormal muscle tone or seizure activity.     Coordination: Coordination normal.  Psychiatric:        Mood and Affect: Mood normal.     ED Results / Procedures / Treatments   Labs (all labs ordered are listed, but only abnormal results are displayed) Labs Reviewed  CBC - Abnormal; Notable for the following components:      Result Value   RBC 5.19 (*)    All other components within normal limits  BASIC METABOLIC  PANEL  MAGNESIUM  BRAIN NATRIURETIC PEPTIDE  URINALYSIS, W/ REFLEX TO CULTURE (INFECTION SUSPECTED)    EKG EKG Interpretation Date/Time:  Saturday May 31 2023 14:24:43 EDT Ventricular Rate:  61 PR Interval:  176 QRS Duration:  138 QT Interval:  435 QTC Calculation: 439 R Axis:   48  Text Interpretation: Sinus rhythm Right bundle branch block No significant change since last tracing Confirmed by Linwood Dibbles [phone]) on 05/31/2023 2:35:52 PM  Radiology DG Chest Portable 1 View  Result Date: 05/31/2023 CLINICAL DATA:  Weakness EXAM: PORTABLE CHEST 1 VIEW COMPARISON:  02/15/2023 chest radiograph. FINDINGS: Stable cardiomediastinal silhouette with normal heart size. No pneumothorax. No pleural effusion. Streaky hazy opacity in the mid and lower lungs bilaterally. IMPRESSION: Streaky hazy opacity in the mid and lower lungs bilaterally, cannot exclude aspiration or pneumonia. Follow-up chest radiographs advised. Electronically Signed   By: Delbert Phenix M.D.   On: 05/31/2023 15:16    Procedures Procedures    Medications Ordered in ED Medications  nitroGLYCERIN (NITROGLYN) 2 % ointment 1 inch (has no administration in time range)    ED Course/ Medical Decision Making/ A&P                                 Medical Decision Making Amount and/or Complexity of Data Reviewed Labs: ordered. Radiology: ordered.  Risk Prescription drug management.   Patient presented to the ED for evaluation of possible worsening confusion in the setting of chronic dementia.  Also noted to have edema in her extremities.  Patient is alert pleasant no specific complaints.  No focal deficits noted on exam.  Will check for urinalysis to assess for recurrent infection.  Will also assess for possible CHF exacerbation. Care turned over to Dr Criss Alvine at shift change        Final Clinical Impression(s) / ED Diagnoses Final diagnoses:  None    Rx / DC Orders ED Discharge Orders     None          Linwood Dibbles, MD 05/31/23 1558

## 2023-05-31 NOTE — H&P (Addendum)
History and Physical:    Cheryl Monroe   ZOX:[id number] DOB: 1935-09-10 DOA: 05/31/2023  Referring MD/provider: Sherlie Ban, MD PCP: Mahlon Gammon, MD   Patient coming from: Nursing home  Chief Complaint: Altered mental status, leg swelling  History of Present Illness:   Cheryl Monroe is a 88 y.o. female with medical history significant for hypertension, on advanced dementia, orthostatic hypotension, recurrent UTIs, urinary incontinence, aphasia, who was brought to the emergency department because of leg and hand swelling and change in mental status.  Patient has dementia and she is confused and unable to provide any history.  History was obtained from chart review and ED physician, Dr. Criss Alvine.  Some history was also obtained from her son, Dr. Gae Bon.  Dr. Sherryll Burger said that her mother is dehydrated and he believes that her mother is dehydrated and has a UTI which tends to affect her mental status.  ED Course:  The patient even IV ceftriaxone in the ED for acute UTI.  She was also given bolus of normal saline 500 cc.  ROS:   ROS unable to obtain because of confusion/dementia  Past Medical History:   Past Medical History:  Diagnosis Date   Alzheimer disease (HCC)    Balance problem 07/19/2020   Constipation 07/19/2020   Dementia without behavioral disturbance (HCC) 07/19/2020   MMSE 21/30 07/21/20   Fall    Osteoarthritis    Pyelonephritis    Spinal stenosis 07/19/2020   Weakness of left lower extremity 07/19/2020    Past Surgical History:   History reviewed. No pertinent surgical history.  Social History:   Social History   Socioeconomic History   Marital status: Legally Separated    Spouse name: Not on file   Number of children: 3   Years of education: Not on file   Highest education level: Some college, no degree  Occupational History   Not on file  Tobacco Use   Smoking status: Never   Smokeless tobacco: Never  Vaping Use   Vaping status: Never Used   Substance and Sexual Activity   Alcohol use: Not Currently   Drug use: Not Currently   Sexual activity: Not on file  Other Topics Concern   Not on file  Social History Narrative   Not on file   Social Determinants of Health   Financial Resource Strain: Not on file  Food Insecurity: Not on file  Transportation Needs: Not on file  Physical Activity: Not on file  Stress: Not on file  Social Connections: Not on file  Intimate Partner Violence: Not on file    Allergies   Sulfa antibiotics  Family history:   Family History  Problem Relation Age of Onset   ALS Mother     Current Medications:   Prior to Admission medications   Medication Sig Start Date End Date Taking? Authorizing Provider  Acetaminophen 500 MG PACK Take by mouth.    [provider]  Cranberry (THERACRAN PO) Take 250 mg by mouth 2 (two) times daily.    [provider]  hydrALAZINE (APRESOLINE) 10 MG tablet Take 10 mg by mouth every 8 (eight) hours as needed.    [provider]  losartan (COZAAR) 25 MG tablet Take 25 mg by mouth daily.    [provider]  nitrofurantoin (MACRODANTIN) 50 MG capsule Take 50 mg by mouth 2 (two) times daily.    [provider]  polyethylene glycol (MIRALAX / GLYCOLAX) 17 g packet Take 17 g by mouth  every other day.    [provider]  senna (SENOKOT) 8.6 MG TABS tablet Take 2 tablets by mouth at bedtime.    [provider]    Physical Exam:   Vitals:   05/31/23 1815 05/31/23 1830 05/31/23 1851 05/31/23 1906  BP: (!) 201/84 (!) 189/69  139/70  Pulse: 69     Resp: 16 (!) 21  18  Temp:   98 F (36.7 C) 97.6 F (36.4 C)  TempSrc:   Oral Oral  SpO2: 100%   97%  Weight:      Height:         Physical Exam: Blood pressure 139/70, pulse 69, temperature 97.6 F (36.4 C), temperature source Oral, resp. rate 18, height 5\' 2"  (1.575 m), weight 71.1 kg, SpO2 97%. Gen: No acute distress. Head: Normocephalic,  atraumatic. Eyes: Pupils equal, round and reactive to light. Extraocular movements intact.  Sclerae nonicteric.  Mouth: Moist mucous membranes Neck: Supple,  no jugular venous distention. Chest: Lungs are clear to auscultation with good air movement. No rales, rhonchi or wheezes.  CV: Heart sounds are regular with an S1, S2. No murmurs, rubs or gallops.  Abdomen: Soft, nontender, nondistended with normal active bowel sounds. No palpable masses. Extremities: Extremities are without clubbing, or cyanosis.  Bilateral pedal edema without erythema or tenderness.  Skin: Warm and dry.  Neuro: Alert and oriented x 1 (person); grossly nonfocal.  Psych: Poor Insight and judgment   Data Review:    Labs: Basic Metabolic Panel: Recent Labs  Lab 05/31/23 1506  NA 137  K 4.0  CL 100  CO2 28  GLUCOSE 95  BUN 17  CREATININE 0.72  CALCIUM 9.4  MG 2.1   Liver Function Tests: No results for input(s): "AST", "ALT", "ALKPHOS", "BILITOT", "PROT", "ALBUMIN" in the last 168 hours. No results for input(s): "LIPASE", "AMYLASE" in the last 168 hours. No results for input(s): "AMMONIA" in the last 168 hours. CBC: Recent Labs  Lab 05/31/23 1506  WBC 7.3  HGB 14.3  HCT 46.0  MCV 88.6  PLT 211   Cardiac Enzymes: No results for input(s): "CKTOTAL", "CKMB", "CKMBINDEX", "TROPONINI" in the last 168 hours.  BNP (last 3 results) No results for input(s): "PROBNP" in the last 8760 hours. CBG: No results for input(s): "GLUCAP" in the last 168 hours.  Urinalysis    Component Value Date/Time   COLORURINE YELLOW 05/31/2023 1506   APPEARANCEUR HAZY (A) 05/31/2023 1506   LABSPEC 1.016 05/31/2023 1506   LABSPEC 1.016 08/27/2022 0600   PHURINE 5.0 05/31/2023 1506   GLUCOSEU NEGATIVE 05/31/2023 1506   GLUCOSEU Normal 08/27/2022 0600   HGBUR SMALL (A) 05/31/2023 1506   HGBUR Negative 08/27/2022 0600   BILIRUBINUR NEGATIVE 05/31/2023 1506   BILIRUBINUR negative 08/27/2022 0600   KETONESUR NEGATIVE  05/31/2023 1506   PROTEINUR NEGATIVE 05/31/2023 1506   NITRITE NEGATIVE 05/31/2023 1506   LEUKOCYTESUR LARGE (A) 05/31/2023 1506      Radiographic Studies: CT HEAD WO CONTRAST ( )  Result Date: 05/31/2023 CLINICAL DATA:  Mental status change, unknown cause EXAM: CT HEAD WITHOUT CONTRAST TECHNIQUE: Contiguous axial images were obtained from the base of the skull through the vertex without intravenous contrast. RADIATION DOSE REDUCTION: This exam was performed according to the departmental dose-optimization program which includes automated exposure control, adjustment of the mA and/or kV according to patient size and/or use of iterative reconstruction technique. COMPARISON:  CT head 06/03/2022 FINDINGS: Brain: Patchy and confluent areas of decreased attenuation are noted throughout the  deep and periventricular white matter of the cerebral hemispheres bilaterally, compatible with chronic microvascular ischemic disease. No evidence of large-territorial acute infarction. No parenchymal hemorrhage. No mass lesion. No extra-axial collection. No mass effect or midline shift. No hydrocephalus. Basilar cisterns are patent. Vascular: No hyperdense vessel. Atherosclerotic calcifications are present within the cavernous internal carotid, and vertebral arteries. Skull: No acute fracture or focal lesion. Sinuses/Orbits: Paranasal sinuses and mastoid air cells are clear. Bilateral lens replacement. The orbits are unremarkable. Other: None. IMPRESSION: No acute intracranial abnormality. Electronically Signed   By: Tish Frederickson M.D.   On: 05/31/2023 19:11   DG Chest Portable 1 View  Result Date: 05/31/2023 CLINICAL DATA:  Weakness EXAM: PORTABLE CHEST 1 VIEW COMPARISON:  02/15/2023 chest radiograph. FINDINGS: Stable cardiomediastinal silhouette with normal heart size. No pneumothorax. No pleural effusion. Streaky hazy opacity in the mid and lower lungs bilaterally. IMPRESSION: Streaky hazy opacity in the mid and  lower lungs bilaterally, cannot exclude aspiration or pneumonia. Follow-up chest radiographs advised. Electronically Signed   By: Delbert Phenix M.D.   On: 05/31/2023 15:16    EKG: Independently reviewed by me showed normal sinus rhythm, right bundle branch block.    Assessment/Plan:   Principal Problem:   Acute UTI    Body mass index is 28.67 kg/m.   Acute UTI: Admit to MedSurg.  Treat with IV ceftriaxone.  Maintenance IV fluids with normal saline.  Follow-up urine cultures.   Suspected pneumonia on chest x-ray: Will cover with IV ceftriaxone.   Altered mental status, acute metabolic encephalopathy: CT head without contrast ordered by me did not show any acute abnormality.  This is likely due to an infectious process.   Hypertensive urgency: She was given hydralazine in the ED.  Resume hydralazine as needed and start losartan tomorrow.   General Weakness: PT evaluation   Dementia: Continue supportive care  Other information:   DVT prophylaxis: Lovenox  Code Status: DNR.  Confirmed by her son Family Communication: Plan discussed with Dr. Langston Masker (son and HPOA) over the phone Disposition Plan: Plan to discharge to SNF in 2 to 3 days Consults called: None Admission status: Observation     Lynk Marti [facility] Pager: Please check [email or web address]   How to contact the [facility] Attending or Consulting provider 7A - 7P or covering provider during after hours 7P -7A, for this patient?   Check the care team in [facility] and look for a) attending/consulting [facility] provider listed and b) the [facility] team listed Log into [email or web address] and use [place]'s universal password to access. If you do not have the password, please contact the hospital operator. Locate the [facility] provider you are looking for under [facility] and page to a number that you can be directly reached. If you still have difficulty reaching the provider, please page the [facility] (Director on Call) for the  Hospitalists listed on amion for assistance.  05/31/2023, 7:22 PM

## 2023-05-31 NOTE — ED Triage Notes (Signed)
Her son wanted facility to send to hospital for leg and hand swelling. State she is a little less responsive at this time but as well has dementia.

## 2023-05-31 NOTE — ED Notes (Signed)
Pt answers questions but unsure if understands the question due to dementia

## 2023-05-31 NOTE — Progress Notes (Signed)
New Admission Note:   Arrival Method: Arrived to unit via stretcher Mental Orientation: Alert and oriented to person Telemetry: Box #8 Assessment: Completed Skin: Intact IV: NSL-Rt AC Pain: 0/10 Tubes: N/A Safety Measures: Safety Fall Prevention Plan has been discussed. Bed alarm on. Call bell within reach. Admission: Completed Orientation: Patient has been oriented to the room, unit and staff.  Family: None at bedside  Orders have been reviewed and implemented. Will continue to monitor the patient. Call light has been placed within reach and bed alarm has been activated.   Breauna Mazzeo [employer], RN-BC Phone number: [phone]

## 2023-06-01 DIAGNOSIS — Z66 Do not resuscitate: Secondary | ICD-10-CM | POA: Diagnosis present

## 2023-06-01 DIAGNOSIS — G309 Alzheimer's disease, unspecified: Secondary | ICD-10-CM | POA: Diagnosis present

## 2023-06-01 DIAGNOSIS — F02811 Dementia in other diseases classified elsewhere, unspecified severity, with agitation: Secondary | ICD-10-CM | POA: Diagnosis present

## 2023-06-01 DIAGNOSIS — F0280 Dementia in other diseases classified elsewhere without behavioral disturbance: Secondary | ICD-10-CM | POA: Diagnosis not present

## 2023-06-01 DIAGNOSIS — N390 Urinary tract infection, site not specified: Secondary | ICD-10-CM | POA: Diagnosis present

## 2023-06-01 DIAGNOSIS — Z882 Allergy status to sulfonamides status: Secondary | ICD-10-CM | POA: Diagnosis not present

## 2023-06-01 DIAGNOSIS — I951 Orthostatic hypotension: Secondary | ICD-10-CM | POA: Diagnosis present

## 2023-06-01 DIAGNOSIS — Z993 Dependence on wheelchair: Secondary | ICD-10-CM | POA: Diagnosis not present

## 2023-06-01 DIAGNOSIS — Z635 Disruption of family by separation and divorce: Secondary | ICD-10-CM | POA: Diagnosis not present

## 2023-06-01 DIAGNOSIS — B3749 Other urogenital candidiasis: Secondary | ICD-10-CM | POA: Diagnosis present

## 2023-06-01 DIAGNOSIS — R32 Unspecified urinary incontinence: Secondary | ICD-10-CM | POA: Diagnosis present

## 2023-06-01 DIAGNOSIS — I10 Essential (primary) hypertension: Secondary | ICD-10-CM | POA: Diagnosis present

## 2023-06-01 DIAGNOSIS — R4701 Aphasia: Secondary | ICD-10-CM | POA: Diagnosis present

## 2023-06-01 DIAGNOSIS — Z7401 Bed confinement status: Secondary | ICD-10-CM | POA: Diagnosis not present

## 2023-06-01 DIAGNOSIS — G9341 Metabolic encephalopathy: Secondary | ICD-10-CM | POA: Diagnosis present

## 2023-06-01 DIAGNOSIS — F05 Delirium due to known physiological condition: Secondary | ICD-10-CM | POA: Diagnosis not present

## 2023-06-01 DIAGNOSIS — M1990 Unspecified osteoarthritis, unspecified site: Secondary | ICD-10-CM | POA: Diagnosis present

## 2023-06-01 DIAGNOSIS — G301 Alzheimer's disease with late onset: Secondary | ICD-10-CM | POA: Diagnosis present

## 2023-06-01 DIAGNOSIS — J189 Pneumonia, unspecified organism: Secondary | ICD-10-CM | POA: Diagnosis present

## 2023-06-01 DIAGNOSIS — I160 Hypertensive urgency: Secondary | ICD-10-CM | POA: Diagnosis present

## 2023-06-01 DIAGNOSIS — M7989 Other specified soft tissue disorders: Secondary | ICD-10-CM | POA: Diagnosis present

## 2023-06-01 DIAGNOSIS — Z79899 Other long term (current) drug therapy: Secondary | ICD-10-CM | POA: Diagnosis not present

## 2023-06-01 LAB — BASIC METABOLIC PANEL WITH GFR
Anion gap: 10 (ref 5–15)
BUN: 13 mg/dL (ref 8–23)
CO2: 25 mmol/L (ref 22–32)
Calcium: 9 mg/dL (ref 8.9–10.3)
Chloride: 104 mmol/L (ref 98–111)
Creatinine, Ser: 0.62 mg/dL (ref 0.44–1.00)
GFR, Estimated: >60 mL/min (ref >60)
Glucose, Bld: 91 mg/dL (ref 70–99)
Potassium: 5 mmol/L (ref 3.5–5.1)
Sodium: 139 mmol/L (ref 135–145)

## 2023-06-01 LAB — CBC
HCT: 40 % (ref 36.0–46.0)
Hemoglobin: 12.7 g/dL (ref 12.0–15.0)
MCH: 29.1 pg (ref 26.0–34.0)
MCHC: 31.8 g/dL (ref 30.0–36.0)
MCV: 91.5 fL (ref 80.0–100.0)
Platelets: 197 10*3/uL (ref 150–400)
RBC: 4.37 MIL/uL (ref 3.87–5.11)
RDW: 13.5 % (ref 11.5–15.5)
WBC: 8.4 10*3/uL (ref 4.0–10.5)
nRBC: 0 % (ref 0.0–0.2)

## 2023-06-01 NOTE — Progress Notes (Signed)
Progress Note    Cheryl Monroe  ZOX:[id number] DOB: 09-01-1935  DOA: 05/31/2023 PCP: Mahlon Gammon, MD      Brief Narrative:    Medical records reviewed and are as summarized below:  Cheryl Monroe is a 88 y.o. female  with medical history significant for hypertension, on advanced dementia, orthostatic hypotension, recurrent UTIs, urinary incontinence, aphasia, who was brought to the emergency department because of leg and hand swelling and change in mental status.  Patient has dementia and she is confused and unable to provide any history.  History was obtained from chart review and ED physician, Dr. Criss Alvine.  Some history was also obtained from her son, Dr. Gae Bon.  Dr. Sherryll Burger said that her mother is dehydrated and he believes that her mother is dehydrated and has a UTI which tends to affect her mental status.         Assessment/Plan:   Principal Problem:   Acute UTI Active Problems:   Essential hypertension   Acute metabolic encephalopathy   Alzheimer's dementia without behavioral disturbance (HCC)   Body mass index is 28.67 kg/m.   Acute UTI: Continue IV ceftriaxone.  Follow-up urine culture     Suspected pneumonia on chest x-ray: She is already on IV ceftriaxone.     Altered mental status, acute metabolic encephalopathy: CT head without contrast ordered by me did not show any acute abnormality.  This is likely due to an infectious process. Mental status appears to be at baseline.    Hypertensive urgency: BP has improved.  Continue losartan and hydralazine as needed..    General Weakness: PT evaluation     Dementia: Continue supportive care             Diet Order             Diet Heart Room service appropriate? Yes; Fluid consistency: Thin  Diet effective now                            Consultants: None  Procedures: None    Medications:    enoxaparin (LOVENOX) injection  40 mg Subcutaneous Q24H   losartan   25 mg Oral Daily   nitroGLYCERIN  1 inch Topical Q6H   polyethylene glycol  17 g Oral QODAY   senna  2 tablet Oral QHS   Continuous Infusions:  cefTRIAXone (ROCEPHIN)  IV 1 g (06/01/23 0956)     Anti-infectives (From admission, onward)    Start     Dose/Rate Route Frequency Ordered Stop   06/01/23 1000  cefTRIAXone (ROCEPHIN) 1 g in sodium chloride 0.9 % 100 mL IVPB        1 g 200 mL/hr over 30 Minutes Intravenous Every 24 hours 05/31/23 1940 06/04/23 0959   05/31/23 1745  cefTRIAXone (ROCEPHIN) 1 g in sodium chloride 0.9 % 100 mL IVPB        1 g 200 mL/hr over 30 Minutes Intravenous  Once 05/31/23 1730 05/31/23 1851              Family Communication/Anticipated D/C date and plan/Code Status   DVT prophylaxis: enoxaparin (LOVENOX) injection 40 mg Start: 06/01/23 1000     Code Status: DNR  Family Communication: None Disposition Plan: Plan to discharge to SNF tomorrow   Status is: Observation The patient will require care spanning > 2 midnights and should be moved to inpatient because: UTI on IV antibiotics awaiting urine culture  Subjective:   Interval events noted.  She is confused and cannot provide an adequate history.  Objective:    Vitals:   05/31/23 2322 06/01/23 0144 06/01/23 0502 06/01/23 0921  BP: 108/63 133/75 125/76 127/76  Pulse: 71 70 69 72  Resp: 20  19   Temp: 97.7 F (36.5 C)  (!) 97.1 F (36.2 C) 98 F (36.7 C)  TempSrc:    Oral  SpO2: 93%  94% 96%  Weight:      Height:       No data found.   Intake/Output Summary (Last 24 hours) at 06/01/2023 1211 Last data filed at 06/01/2023 0200 Gross per 24 hour  Intake 309.62 ml  Output 0 ml  Net 309.62 ml   Filed Weights   05/31/23 1411  Weight: 71.1 kg    Exam:  GEN: NAD SKIN: Warm and dry EYES: EOMI ENT: MMM CV: RRR PULM: CTA B ABD: soft, ND, NT, +BS CNS: AAO x 1 (person), non focal, slurred speech at baseline EXT: No edema or tenderness        Data  Reviewed:   I have personally reviewed following labs and imaging studies:  Labs: Labs show the following:   Basic Metabolic Panel: Recent Labs  Lab 05/31/23 1506 06/01/23 0219  NA 137 139  K 4.0 5.0  CL 100 104  CO2 28 25  GLUCOSE 95 91  BUN 17 13  CREATININE 0.72 0.62  CALCIUM 9.4 9.0  MG 2.1  --    GFR Estimated Creatinine Clearance: 47.5 mL/min (by C-G formula based on SCr of 0.62 mg/dL). Liver Function Tests: No results for input(s): "AST", "ALT", "ALKPHOS", "BILITOT", "PROT", "ALBUMIN" in the last 168 hours. No results for input(s): "LIPASE", "AMYLASE" in the last 168 hours. No results for input(s): "AMMONIA" in the last 168 hours. Coagulation profile No results for input(s): "INR", "PROTIME" in the last 168 hours.  CBC: Recent Labs  Lab 05/31/23 1506 06/01/23 0219  WBC 7.3 8.4  HGB 14.3 12.7  HCT 46.0 40.0  MCV 88.6 91.5  PLT 211 197   Cardiac Enzymes: No results for input(s): "CKTOTAL", "CKMB", "CKMBINDEX", "TROPONINI" in the last 168 hours. BNP (last 3 results) No results for input(s): "PROBNP" in the last 8760 hours. CBG: No results for input(s): "GLUCAP" in the last 168 hours. D-Dimer: No results for input(s): "DDIMER" in the last 72 hours. Hgb A1c: No results for input(s): "HGBA1C" in the last 72 hours. Lipid Profile: No results for input(s): "CHOL", "HDL", "LDLCALC", "TRIG", "CHOLHDL", "LDLDIRECT" in the last 72 hours. Thyroid function studies: No results for input(s): "TSH", "T4TOTAL", "T3FREE", "THYROIDAB" in the last 72 hours.  Invalid input(s): "FREET3" Anemia work up: No results for input(s): "VITAMINB12", "FOLATE", "FERRITIN", "TIBC", "IRON", "RETICCTPCT" in the last 72 hours. Sepsis Labs: Recent Labs  Lab 05/31/23 1506 06/01/23 0219  WBC 7.3 8.4    Microbiology No results found for this or any previous visit (from the past 240 hour(s)).  Procedures and diagnostic studies:  CT HEAD WO CONTRAST ( )  Result Date:  05/31/2023 CLINICAL DATA:  Mental status change, unknown cause EXAM: CT HEAD WITHOUT CONTRAST TECHNIQUE: Contiguous axial images were obtained from the base of the skull through the vertex without intravenous contrast. RADIATION DOSE REDUCTION: This exam was performed according to the departmental dose-optimization program which includes automated exposure control, adjustment of the mA and/or kV according to patient size and/or use of iterative reconstruction technique. COMPARISON:  CT head 06/03/2022 FINDINGS: Brain: Patchy and confluent  areas of decreased attenuation are noted throughout the deep and periventricular white matter of the cerebral hemispheres bilaterally, compatible with chronic microvascular ischemic disease. No evidence of large-territorial acute infarction. No parenchymal hemorrhage. No mass lesion. No extra-axial collection. No mass effect or midline shift. No hydrocephalus. Basilar cisterns are patent. Vascular: No hyperdense vessel. Atherosclerotic calcifications are present within the cavernous internal carotid, and vertebral arteries. Skull: No acute fracture or focal lesion. Sinuses/Orbits: Paranasal sinuses and mastoid air cells are clear. Bilateral lens replacement. The orbits are unremarkable. Other: None. IMPRESSION: No acute intracranial abnormality. Electronically Signed   By: Tish Frederickson M.D.   On: 05/31/2023 19:11   DG Chest Portable 1 View  Result Date: 05/31/2023 CLINICAL DATA:  Weakness EXAM: PORTABLE CHEST 1 VIEW COMPARISON:  02/15/2023 chest radiograph. FINDINGS: Stable cardiomediastinal silhouette with normal heart size. No pneumothorax. No pleural effusion. Streaky hazy opacity in the mid and lower lungs bilaterally. IMPRESSION: Streaky hazy opacity in the mid and lower lungs bilaterally, cannot exclude aspiration or pneumonia. Follow-up chest radiographs advised. Electronically Signed   By: Delbert Phenix M.D.   On: 05/31/2023 15:16               LOS: 0 days    Cheryl Monroe  [facility]   Pager on [email or web address]. If 7PM-7AM, please contact night-coverage at [email or web address]     06/01/2023, 12:11 PM

## 2023-06-01 NOTE — Evaluation (Signed)
Physical Therapy Evaluation Patient Details Name: Cheryl Monroe MRN: [id number] DOB: 01-Oct-1935 Today's Date: 06/01/2023  History of Present Illness  Pt is an 88 y.o. female admitted from nursing home on 05/31/23 with AMS. Workup for acute metabolic encephalopathy, UTI, PNA, hypertensive urgency. PMH includes Alzheimer's disease, aphasia, OA, orthostatic hypotension, recurrent UTIs.   Clinical Impression  Pt presents with an overall decrease in functional mobility secondary to above. Per chart, pt admitted from nursing home. Today, pt's speech difficult to understand with intermittently appropriate and intelligble responses in conversation; pt requiring maxA for bed mobility, unable to stand with assist +1. Per PT evaluation note from admission three months prior, pt requires hoyer lift for transfers to wheelchair. Pt would benefit from continued acute PT services to maximize functional mobility and decrease caregiver burden prior to return to SNF.       If plan is discharge home, recommend the following: Two people to help with walking and/or transfers;A lot of help with bathing/dressing/bathroom;Assistance with feeding   Can travel by private vehicle   Yes    Equipment Recommendations None recommended by PT  Recommendations for Other Services       Functional Status Assessment Patient has had a recent decline in their functional status and demonstrates the ability to make significant improvements in function in a reasonable and predictable amount of time.     Precautions / Restrictions Precautions Precautions: Fall Restrictions Weight Bearing Restrictions: No      Mobility  Bed Mobility Overal bed mobility: Needs Assistance Bed Mobility: Supine to Sit, Sit to Supine     Supine to sit: Max assist, HOB elevated Sit to supine: Max assist   General bed mobility comments: fair movement initiation with cues, maxA for BLE management and scooting hips to EOB, modA for trunk  elevation; maxA for LE management return to supine    Transfers Overall transfer level: Needs assistance                 General transfer comment: attempted 2x trials standing from EOB to RW, pt requiring max verbal/tactile cues for hand placement, minimal initiation of forward weight translation or BLE WB; ultimately unable to stand despite maxA    Ambulation/Gait                  Stairs            Wheelchair Mobility     Tilt Bed    Modified Rankin (Stroke Patients Only)       Balance Overall balance assessment: Needs assistance Sitting-balance support: No upper extremity supported, Feet unsupported Sitting balance-Leahy Scale: Fair Sitting balance - Comments: once assisted to scoot hips forward, pt able to maintain prolonged sitting balance at edge of bed; assist to don/doff bilateral shoes                                     Pertinent Vitals/Pain Pain Assessment Pain Assessment: Faces Faces Pain Scale: No hurt Pain Intervention(s): Monitored during session    Home Living Family/patient expects to be discharged to:: Skilled nursing facility                   Additional Comments: Per chart, from [facility] SNF    Prior Function Prior Level of Function : Needs assist;Patient poor historian/Family not available             Mobility Comments: per chart, pt  from SNF; PT eval three months prior says mostly using w/c with hoyer lift for transfers       Extremity/Trunk Assessment   Upper Extremity Assessment Upper Extremity Assessment: Generalized weakness;Difficult to assess due to impaired cognition (noted bilateral wrist/hand swelling; grimacing with discomfort shoulder AROM >120')    Lower Extremity Assessment Lower Extremity Assessment: Generalized weakness;Difficult to assess due to impaired cognition (noted bilateral lower leg/ankle swelling; gross observed knee strength >/ 3/5, hip < 3/5)       Communication    Communication Communication: Difficulty communicating thoughts/reduced clarity of speech  Cognition Arousal: Alert Behavior During Therapy: Restless Overall Cognitive Status: History of cognitive impairments - at baseline                                 General Comments: h/o advanced dementia. mumbling speech difficult to understand, intermittently appropriate and intelligble responses in conversation; following simple commands and performing some automatic tasks with min cues        General Comments      Exercises     Assessment/Plan    PT Assessment Patient needs continued PT services  PT Problem List Decreased strength;Decreased activity tolerance;Decreased balance;Decreased mobility;Decreased cognition;Decreased knowledge of use of DME       PT Treatment Interventions DME instruction;Functional mobility training;Therapeutic activities;Therapeutic exercise;Balance training;Wheelchair mobility training;Patient/family education    PT Goals (Current goals can be found in the Care Plan section)  Acute Rehab PT Goals PT Goal Formulation: Patient unable to participate in goal setting Time For Goal Achievement: 06/15/23 Potential to Achieve Goals: Fair    Frequency Min 1X/week     Co-evaluation               AM-PAC PT "6 Clicks" Mobility  Outcome Measure Help needed turning from your back to your side while in a flat bed without using bedrails?: A Lot Help needed moving from lying on your back to sitting on the side of a flat bed without using bedrails?: Total Help needed moving to and from a bed to a chair (including a wheelchair)?: Total Help needed standing up from a chair using your arms (e.g., wheelchair or bedside chair)?: Total Help needed to walk in hospital room?: Total Help needed climbing 3-5 steps with a railing? : Total 6 Click Score: 7    End of Session Equipment Utilized During Treatment: Gait belt Activity Tolerance: Patient  tolerated treatment well Patient left: in bed;with call bell/phone within reach;with bed alarm set Nurse Communication: Mobility status;Need for lift equipment PT Visit Diagnosis: Other abnormalities of gait and mobility (R26.89);Muscle weakness (generalized) (M62.81)    Time: 1610-9604 PT Time Calculation (min) (ACUTE ONLY): 21 min   Charges:   PT Evaluation $PT Eval Moderate Complexity: 1 Mod   PT General Charges $$ ACUTE PT VISIT: 1 Visit    Ina Homes, PT, DPT Acute Rehabilitation Services  Personal: Secure Chat Rehab Office: [phone]  Malachy Chamber 06/01/2023, 5:18 PM

## 2023-06-02 DIAGNOSIS — N390 Urinary tract infection, site not specified: Secondary | ICD-10-CM | POA: Diagnosis not present

## 2023-06-02 LAB — URINE CULTURE: Culture: 70000 — AB

## 2023-06-02 MED ORDER — FLUCONAZOLE 100 MG PO TABS: 100.0000 mg | ORAL_TABLET | Freq: Every day | ORAL | Status: DC

## 2023-06-02 MED ORDER — SODIUM CHLORIDE 0.9 % IV SOLN
1.0000 g | INTRAVENOUS | Status: AC
  Administered 2023-06-03 – 2023-06-04 (×2): 1 g via INTRAVENOUS
  Filled 2023-06-02 (×2): qty 10

## 2023-06-02 MED ORDER — FLUCONAZOLE 100 MG PO TABS
100.0000 mg | ORAL_TABLET | Freq: Every day | ORAL | Status: DC
  Administered 2023-06-02 – 2023-06-05 (×4): 100 mg via ORAL
  Filled 2023-06-02 (×6): qty 1

## 2023-06-02 NOTE — Progress Notes (Signed)
PROGRESS NOTE    Cheryl Monroe  UYQ:[id number] DOB: Jul 11, 1935 DOA: 05/31/2023 PCP: Mahlon Gammon, MD  Chief Complaint  Patient presents with   Leg Swelling    Brief Narrative:   Cheryl Monroe is Cheryl Monroe 88 y.o. female with medical history significant for hypertension, on advanced dementia, orthostatic hypotension, recurrent UTIs, urinary incontinence, aphasia, who was brought to the emergency department because of leg and hand swelling and change in mental status.  She's being treated for UTI and community acquired pneumonia.  Assessment & Plan:   Principal Problem:   Acute UTI Active Problems:   Essential hypertension   Acute metabolic encephalopathy   Alzheimer's dementia without behavioral disturbance (HCC)  Acute Metabolic Encephalopathy Dementia  Delirium Suspect this represents delirium in setting of pneumonia +/- UTI Head CT without acute abnormality Follow with treatment Delirium precautions Discussed with son, he'll let us know how close she is to baseline when he visits  Community Acquired Pneumonia CXR can't rule out aspiration or pneumonia Ceftriaxone No blood cultures collected on admission SLP eval  Yeast UTI:  Unclear if this represents true infection, but will treat with her presentation  Hypertension: Continue losartan and hydralazine as needed..   General Weakness: she's wheelchair/bed bound at baseline.  Therapy.    DVT prophylaxis: lovenox Code Status: DNR Family Communication: son Disposition:   Status is: Inpatient Remains inpatient appropriate because: need for continued inpatient care   Consultants:  none  Procedures:  none  Antimicrobials:  Anti-infectives (From admission, onward)    Start     Dose/Rate Route Frequency Ordered Stop   06/03/23 1000  cefTRIAXone (ROCEPHIN) 1 g in sodium chloride 0.9 % 100 mL IVPB        1 g 200 mL/hr over 30 Minutes Intravenous Every 24 hours 06/02/23 1319 06/05/23 0959   06/01/23 1000   cefTRIAXone (ROCEPHIN) 1 g in sodium chloride 0.9 % 100 mL IVPB  Status:  Discontinued        1 g 200 mL/hr over 30 Minutes Intravenous Every 24 hours 05/31/23 1940 06/02/23 1317   05/31/23 1745  cefTRIAXone (ROCEPHIN) 1 g in sodium chloride 0.9 % 100 mL IVPB        1 g 200 mL/hr over 30 Minutes Intravenous  Once 05/31/23 1730 05/31/23 1851       Subjective: Pleasantly confused, some mumbling  Objective: Vitals:   06/01/23 2123 06/02/23 0010 06/02/23 0434 06/02/23 0900  BP: 124/89 (!) 159/87 (!) 161/74 (!) 171/75  Pulse: 72 73 73 69  Resp: 16  17 18   Temp: (!) 97.5 F (36.4 C)  (!) 97.5 F (36.4 C) 97.6 F (36.4 C)  TempSrc: Oral  Oral   SpO2: 100%  99% 98%  Weight:      Height:        Intake/Output Summary (Last 24 hours) at 06/02/2023 1320 Last data filed at 06/02/2023 6387 Gross per 24 hour  Intake 200 ml  Output 1000 ml  Net -800 ml   Filed Weights   05/31/23 1411  Weight: 71.1 kg    Examination:  General exam: Appears calm and comfortable  Respiratory system: unlabored Cardiovascular system: RRR Gastrointestinal system: Abdomen is nondistended, soft and nontender.  Central nervous system: Alert, pleasantly confused. Extremities: no LEE   Data Reviewed: I have personally reviewed following labs and imaging studies  CBC: Recent Labs  Lab 05/31/23 1506 06/01/23 0219  WBC 7.3 8.4  HGB 14.3 12.7  HCT 46.0 40.0  MCV 88.6 91.5  PLT 211 197    Basic Metabolic Panel: Recent Labs  Lab 05/31/23 1506 06/01/23 0219  NA 137 139  K 4.0 5.0  CL 100 104  CO2 28 25  GLUCOSE 95 91  BUN 17 13  CREATININE 0.72 0.62  CALCIUM 9.4 9.0  MG 2.1  --     GFR: Estimated Creatinine Clearance: 47.5 mL/min (by C-G formula based on SCr of 0.62 mg/dL).  Liver Function Tests: No results for input(s): "AST", "ALT", "ALKPHOS", "BILITOT", "PROT", "ALBUMIN" in the last 168 hours.  CBG: No results for input(s): "GLUCAP" in the last 168 hours.   Recent Results  (from the past 240 hour(s))  Urine Culture     Status: Abnormal   Collection Time: 05/31/23  3:06 PM   Specimen: Urine, Random  Result Value Ref Range Status   Specimen Description URINE, RANDOM  Final   Special Requests   Final    NONE Reflexed from [id number] Performed at [facility], [address], [place], [place] [address]    Culture 70,000 COLONIES/mL YEAST (Lache Dagher)  Final   Report Status 06/02/2023 FINAL  Final         Radiology Studies: CT HEAD WO CONTRAST ( )  Result Date: 05/31/2023 CLINICAL DATA:  Mental status change, unknown cause EXAM: CT HEAD WITHOUT CONTRAST TECHNIQUE: Contiguous axial images were obtained from the base of the skull through the vertex without intravenous contrast. RADIATION DOSE REDUCTION: This exam was performed according to the departmental dose-optimization program which includes automated exposure control, adjustment of the mA and/or kV according to patient size and/or use of iterative reconstruction technique. COMPARISON:  CT head 06/03/2022 FINDINGS: Brain: Patchy and confluent areas of decreased attenuation are noted throughout the deep and periventricular white matter of the cerebral hemispheres bilaterally, compatible with chronic microvascular ischemic disease. No evidence of large-territorial acute infarction. No parenchymal hemorrhage. No mass lesion. No extra-axial collection. No mass effect or midline shift. No hydrocephalus. Basilar cisterns are patent. Vascular: No hyperdense vessel. Atherosclerotic calcifications are present within the cavernous internal carotid, and vertebral arteries. Skull: No acute fracture or focal lesion. Sinuses/Orbits: Paranasal sinuses and mastoid air cells are clear. Bilateral lens replacement. The orbits are unremarkable. Other: None. IMPRESSION: No acute intracranial abnormality. Electronically Signed   By: Tish Frederickson M.D.   On: 05/31/2023 19:11   DG Chest Portable 1 View  Result Date: 05/31/2023 CLINICAL  DATA:  Weakness EXAM: PORTABLE CHEST 1 VIEW COMPARISON:  02/15/2023 chest radiograph. FINDINGS: Stable cardiomediastinal silhouette with normal heart size. No pneumothorax. No pleural effusion. Streaky hazy opacity in the mid and lower lungs bilaterally. IMPRESSION: Streaky hazy opacity in the mid and lower lungs bilaterally, cannot exclude aspiration or pneumonia. Follow-up chest radiographs advised. Electronically Signed   By: Delbert Phenix M.D.   On: 05/31/2023 15:16        Scheduled Meds:  enoxaparin (LOVENOX) injection  40 mg Subcutaneous Q24H   losartan  25 mg Oral Daily   nitroGLYCERIN  1 inch Topical Q6H   polyethylene glycol  17 g Oral QODAY   senna  2 tablet Oral QHS   Continuous Infusions:  [START ON 06/03/2023] cefTRIAXone (ROCEPHIN)  IV       LOS: 1 day    Time spent: over 30 min    Lacretia Nicks, MD [facility]   To contact the attending provider between 7A-7P or the covering provider during after hours 7P-7A, please log into the web site [email or web address] and access using universal [facility] password for that web site. If you do not have the password, please call the hospital operator.  06/02/2023, 1:20 PM

## 2023-06-02 NOTE — Progress Notes (Signed)
Pharmacy Antibiotic Note  Cheryl Monroe is a 88 y.o. female admitted on 05/31/2023 with  altered mental status .  Pharmacy has been consulted for fluconazole dosing. Treating yeast in urine as pathogenic per d/w MD given clinical presentation. MD ok w/ short course of fluconazole.  Plan: Fluconazole 100mg  PO x 3 days (CrCl < 50) Ceftriaxone 1g IV q24 hours x 5 days per MD  Pharmacy will formally sign off consult but continue to monitor peripherally.  Height: 5\' 2"  (157.5 cm) Weight: 71.1 kg (156 lb 12 oz) IBW/kg (Calculated) : 50.1  Temp (24hrs), Avg:97.7 F (36.5 C), Min:97.5 F (36.4 C), Max:98.2 F (36.8 C)  Recent Labs  Lab 05/31/23 1506 06/01/23 0219  WBC 7.3 8.4  CREATININE 0.72 0.62    Estimated Creatinine Clearance: 47.5 mL/min (by C-G formula based on SCr of 0.62 mg/dL).    Allergies  Allergen Reactions   Sulfa Antibiotics Rash    Rash to trunk, legs, neck, scalp, and arms    Antimicrobials this admission: Ceftriaxone 8/10 >> 8/14 Fluconazole 8/12 >> 8/14  Dose adjustments this admission:  Microbiology results: 8/10 UCx: 70K yeast   Thank you for allowing pharmacy to be a part of this patient's care.  Rexford Maus, PharmD, BCPS 06/02/2023 1:53 PM

## 2023-06-03 DIAGNOSIS — N390 Urinary tract infection, site not specified: Secondary | ICD-10-CM | POA: Diagnosis not present

## 2023-06-03 MED ORDER — POLYETHYLENE GLYCOL 3350 17 G PO PACK
17.0000 g | PACK | Freq: Two times a day (BID) | ORAL | Status: DC
  Administered 2023-06-03 – 2023-06-05 (×5): 17 g via ORAL
  Filled 2023-06-03 (×7): qty 1

## 2023-06-03 NOTE — Evaluation (Signed)
Clinical/Bedside Swallow Evaluation Patient Details  Name: Cheryl Monroe MRN: [id number] Date of Birth: 1934-12-01  Today's Date: 06/03/2023 Time: SLP Start Time (ACUTE ONLY): 1720 SLP Stop Time (ACUTE ONLY): 1738 SLP Time Calculation (min) (ACUTE ONLY): 18 min  Past Medical History:  Past Medical History:  Diagnosis Date   Alzheimer disease (HCC)    Balance problem 07/19/2020   Constipation 07/19/2020   Dementia without behavioral disturbance (HCC) 07/19/2020   MMSE 21/30 07/21/20   Fall    Osteoarthritis    Pyelonephritis    Spinal stenosis 07/19/2020   Weakness of left lower extremity 07/19/2020   Past Surgical History: History reviewed. No pertinent surgical history. HPI:  Cheryl Monroe is an 88 yo female presenting to ED 8/10 with leg/hand swelling and AMS. Seen by SLP 02/05/23 with concern for silent aspiration. MBS completed 02/06/23 with presbyphagia and no penetration/aspiration. At that time, a regular diet with thin liquids was recommended and no further f/u. CXR with streaky hazy opacity in mid and lower lungs bilaterally. CTH unremarkable. PMH includes HTN, advanced dementia, orthostatic hypotension, recurrent UTIs, urinary incontinence, aphasia    Assessment / Plan / Recommendation  Clinical Impression  Pt extremely hesitant to participate with SLP. Attempted to provide education regarding role and importance of swallowing safety. Attempted to make pt comfortable by providing extra blanket. She initially denied any PO intake, but when given more extensive options from meal tray, hesitantly agreed. Pt with an extremely low vocal quality and appears confused. Unable to follow commands to complete an oral motor exam. Pt observed with minimal trials of purees and solids with no overt s/s of aspiration. Her dysphagia appears cognitive in nature as she expectorated PO trials and stated that she did not like them. She refused any trials of thin liquids. Given minimal amount of observed PO  trials and altered mentation, do not feel that she is appropriate for a PO diet. Recommend NPO pending further PO trials to assess risk for aspiration given CXR findings. Will continue to f/u as able to further assess swallowing as mentation improves. SLP Visit Diagnosis: Dysphagia, unspecified (R13.10)    Aspiration Risk  Moderate aspiration risk;Risk for inadequate nutrition/hydration    Diet Recommendation NPO    Medication Administration: Via alternative means    Other  Recommendations Oral Care Recommendations: Oral care QID    Recommendations for follow up therapy are one component of a multi-disciplinary discharge planning process, led by the attending physician.  Recommendations may be updated based on patient status, additional functional criteria and insurance authorization.  Follow up Recommendations Skilled nursing-short term rehab (<3 hours/day)      Assistance Recommended at Discharge    Functional Status Assessment Patient has had a recent decline in their functional status and demonstrates the ability to make significant improvements in function in a reasonable and predictable amount of time.  Frequency and Duration min 2x/week  2 weeks       Prognosis Prognosis for improved oropharyngeal function: Good Barriers to Reach Goals: Cognitive deficits;Language deficits      Swallow Study   General HPI: Cheryl Monroe is an 88 yo female presenting to ED 8/10 with leg/hand swelling and AMS. Seen by SLP 02/05/23 with concern for silent aspiration. MBS completed 02/06/23 with presbyphagia and no penetration/aspiration. At that time, a regular diet with thin liquids was recommended and no further f/u. CXR with streaky hazy opacity in mid and lower lungs bilaterally. CTH unremarkable. PMH includes HTN, advanced dementia, orthostatic hypotension, recurrent UTIs,  urinary incontinence, aphasia Type of Study: Bedside Swallow Evaluation Previous Swallow Assessment: see HPI Diet Prior  to this Study: Regular;Thin liquids (Level 0) Temperature Spikes Noted: No Respiratory Status: Room air History of Recent Intubation: No Behavior/Cognition: Alert;Confused;Agitated;Requires cueing;Doesn't follow directions Oral Cavity Assessment: Within Functional Limits Oral Care Completed by SLP: No Oral Cavity - Dentition: Adequate natural dentition Vision: Functional for self-feeding Self-Feeding Abilities: Needs assist Patient Positioning: Upright in bed Baseline Vocal Quality: Low vocal intensity Volitional Cough: Cognitively unable to elicit Volitional Swallow: Unable to elicit    Oral/Motor/Sensory Function Overall Oral Motor/Sensory Function: Within functional limits   Ice Chips Ice chips: Not tested   Thin Liquid Thin Liquid: Not tested    Nectar Thick Nectar Thick Liquid: Not tested   Honey Thick Honey Thick Liquid: Not tested   Puree Puree: Within functional limits Presentation: Spoon   Solid     Solid: Within functional limits      Gwynneth Aliment, M.A., CF-SLP Speech Language Pathology, Acute Rehabilitation Services  Secure Chat preferred [phone]  06/03/2023,6:10 PM

## 2023-06-03 NOTE — Progress Notes (Signed)
PROGRESS NOTE    Cheryl Monroe  NAT:[id number] DOB: [birth year] DOA: 05/31/2023 PCP: Cheryl Gammon, MD  Chief Complaint  Patient presents with   Leg Swelling    Brief Narrative:   Cheryl Monroe is Cheryl Monroe 88 y.o. female with medical history significant for hypertension, on advanced dementia, orthostatic hypotension, recurrent UTIs, urinary incontinence, aphasia, who was brought to the emergency department because of leg and hand swelling and change in mental status.  She's being treated for UTI and community acquired pneumonia.  Assessment & Plan:   Principal Problem:   Acute UTI Active Problems:   Essential hypertension   Acute metabolic encephalopathy   Alzheimer's dementia without behavioral disturbance (HCC)  Acute Metabolic Encephalopathy Dementia  Delirium Suspect this represents delirium in setting of pneumonia +/- UTI Head CT without acute abnormality Follow with treatment Delirium precautions Her son was here this morning, he notes she's still not to her baseline.  Will continue supportive care.  Community Acquired Pneumonia CXR can't rule out aspiration or pneumonia Ceftriaxone x 5 days No blood cultures collected on admission SLP eval  Yeast UTI:  Unclear if this represents true infection, but will treat given persistent encephalopathy. Will plan for 7 days fluconazole, discussed with her son.  Hypertension: Continue losartan and hydralazine as needed..   General Weakness: she's wheelchair/bed bound at baseline.  Therapy.    DVT prophylaxis: lovenox Code Status: DNR Family Communication: son Disposition:   Status is: Inpatient Remains inpatient appropriate because: need for continued inpatient care   Consultants:  none  Procedures:  none  Antimicrobials:  Anti-infectives (From admission, onward)    Start     Dose/Rate Route Frequency Ordered Stop   06/03/23 1000  cefTRIAXone (ROCEPHIN) 1 g in sodium chloride 0.9 % 100 mL IVPB        1 g 200  mL/hr over 30 Minutes Intravenous Every 24 hours 06/02/23 1319 06/05/23 0959   06/02/23 1445  fluconazole (DIFLUCAN) tablet 100 mg  Status:  Discontinued        100 mg Oral Daily 06/02/23 1352 06/02/23 1353   06/02/23 1445  fluconazole (DIFLUCAN) tablet 100 mg        100 mg Oral Daily 06/02/23 1353 06/05/23 0959   06/01/23 1000  cefTRIAXone (ROCEPHIN) 1 g in sodium chloride 0.9 % 100 mL IVPB  Status:  Discontinued        1 g 200 mL/hr over 30 Minutes Intravenous Every 24 hours 05/31/23 1940 06/02/23 1317   05/31/23 1745  cefTRIAXone (ROCEPHIN) 1 g in sodium chloride 0.9 % 100 mL IVPB        1 g 200 mL/hr over 30 Minutes Intravenous  Once 05/31/23 1730 05/31/23 1851       Subjective: Pleasantly confused, some mumbling  Objective: Vitals:   06/02/23 2122 06/03/23 0557 06/03/23 0827 06/03/23 1519  BP: (!) 192/83 (!) 147/56 (!) 143/83 (!) 155/60  Pulse: 72 71 71 81  Resp: 16  16 16   Temp: 98 F (36.7 C) (!) 97.5 F (36.4 C) 98.2 F (36.8 C) 97.9 F (36.6 C)  TempSrc:   Oral   SpO2:  99% 97% 96%  Weight:      Height:        Intake/Output Summary (Last 24 hours) at 06/03/2023 1539 Last data filed at 06/03/2023 1300 Gross per 24 hour  Intake 580 ml  Output 1200 ml  Net -620 ml   Filed Weights   05/31/23 1411  Weight: 71.1 kg  Examination:  General: No acute distress. Cardiovascular: RRR Lungs: unlabored Abdomen: Soft, nontender, nondistended  Neurological: pleasantly confused - surprised when I remind her she's in the hospital  Extremities: No clubbing or cyanosis. No edema.    Data Reviewed: I have personally reviewed following labs and imaging studies  CBC: Recent Labs  Lab 05/31/23 1506 06/01/23 0219 06/03/23 0212  WBC 7.3 8.4 7.3  NEUTROABS  --   --  4.7  HGB 14.3 12.7 13.3  HCT 46.0 40.0 41.6  MCV 88.6 91.5 86.0  PLT 211 197 195    Basic Metabolic Panel: Recent Labs  Lab 05/31/23 1506 06/01/23 0219 06/03/23 0212  NA 137 139  --   K 4.0 5.0   --   CL 100 104  --   CO2 28 25  --   GLUCOSE 95 91  --   BUN 17 13  --   CREATININE 0.72 0.62  --   CALCIUM 9.4 9.0  --   MG 2.1  --  1.9  PHOS  --   --  3.4    GFR: Estimated Creatinine Clearance: 47.5 mL/min (by C-G formula based on SCr of 0.62 mg/dL).  Liver Function Tests: No results for input(s): "AST", "ALT", "ALKPHOS", "BILITOT", "PROT", "ALBUMIN" in the last 168 hours.  CBG: No results for input(s): "GLUCAP" in the last 168 hours.   Recent Results (from the past 240 hour(s))  Urine Culture     Status: Abnormal   Collection Time: 05/31/23  3:06 PM   Specimen: Urine, Random  Result Value Ref Range Status   Specimen Description URINE, RANDOM  Final   Special Requests   Final    NONE Reflexed from [id number] Performed at [facility], [address] [place], [place] [address]    Culture 70,000 COLONIES/mL YEAST (Alcario Tinkey)  Final   Report Status 06/02/2023 FINAL  Final         Radiology Studies: No results found.      Scheduled Meds:  enoxaparin (LOVENOX) injection  40 mg Subcutaneous Q24H   fluconazole  100 mg Oral Daily   losartan  25 mg Oral Daily   nitroGLYCERIN  1 inch Topical Q6H   polyethylene glycol  17 g Oral BID   senna  2 tablet Oral QHS   Continuous Infusions:  cefTRIAXone (ROCEPHIN)  IV 1 g (06/03/23 1059)     LOS: 2 days    Time spent: over 30 min    Cheryl Nicks, MD [facility]   To contact the attending provider between 7A-7P or the covering provider during after hours 7P-7A, please log into the web site [email or web address] and access using universal [place] password for that web site. If you do not have the password, please call the hospital operator.  06/03/2023, 3:39 PM

## 2023-06-03 NOTE — TOC Progression Note (Signed)
Transition of Care [facility]) - Initial/Assessment Note    Patient Details  Name: Cheryl Monroe MRN: [id number] Date of Birth: 1935-10-18  [facility] of [facility]) CM/SW Contact:    Ralene Bathe, LCSW Phone Number: 06/03/2023, 1:20 PM  Clinical Narrative:                 LCSW spoke with Sallye Ober at [facility].  The patient is from the skilled unit and can return when medically ready.  The facility will need an FL2 and discharge summary prior to the patient returning.  The information can be faxed to [phone].  Report will need to be called to [phone] or [phone] when patient is medically ready to d/c.  TOC following.            Patient Goals and CMS Choice            Expected Discharge Plan and Services                                              Prior Living Arrangements/Services                       Activities of Daily Living      Permission Sought/Granted                  Emotional Assessment              Admission diagnosis:  Acute UTI [N39.0] Patient Active Problem List   Diagnosis Date Noted   Acute UTI 05/31/2023   CAP (community acquired pneumonia) 02/05/2023   Aphasia 02/05/2023   Neurogenic bladder 04/18/2022   Acute metabolic encephalopathy 08/02/2021   Word finding difficulty 02/22/2021   Alzheimer's dementia without behavioral disturbance (HCC) 09/10/2020   History of COVID-19 07/26/2020   Orthostatic dizziness 07/19/2020   Balance problem 07/19/2020   Constipation 07/19/2020   Clitoral irritation 07/19/2020   Asymptomatic bacteriuria 07/19/2020   Urinary frequency 07/19/2020   Urge incontinence 07/19/2020   Weakness of left lower extremity 07/19/2020   Essential hypertension 07/19/2020   Weakness 07/19/2020   Impaired mobility 07/19/2020   Recurrent UTI 07/19/2020   Dementia without behavioral disturbance (HCC) 07/19/2020   Edema 07/19/2020   Spinal stenosis 07/19/2020   PCP:  Mahlon Gammon, MD Pharmacy:   [facility] [place], [place] - [address] [place] [address] Phone: [phone] Fax: [phone]     Social Determinants of Health (SDOH) Social History: SDOH Screenings   Depression (PHQ2-9): Low Risk  (12/05/2022)  Tobacco Use: Low Risk  (05/31/2023)   SDOH Interventions:     Readmission Risk Interventions    02/11/2023    4:11 PM  Readmission Risk Prevention Plan  Post Dischage Appt Complete  Medication Screening Complete  Transportation Screening Complete

## 2023-06-04 DIAGNOSIS — G9341 Metabolic encephalopathy: Secondary | ICD-10-CM

## 2023-06-04 DIAGNOSIS — N390 Urinary tract infection, site not specified: Secondary | ICD-10-CM | POA: Diagnosis not present

## 2023-06-04 DIAGNOSIS — F0280 Dementia in other diseases classified elsewhere without behavioral disturbance: Secondary | ICD-10-CM

## 2023-06-04 DIAGNOSIS — G309 Alzheimer's disease, unspecified: Secondary | ICD-10-CM | POA: Diagnosis not present

## 2023-06-04 NOTE — Progress Notes (Signed)
Speech Language Pathology Treatment: Dysphagia  Patient Details Name: Cheryl Monroe MRN: [id number] DOB: 04-14-1935 Today's Date: 06/04/2023 Time: 8295-6213 SLP Time Calculation (min) (ACUTE ONLY): 25 min  Assessment / Plan / Recommendation Clinical Impression  Pt much more alert today and agreeable to PO trials. Observed with trials of thin liquids and solids with no overt s/s of dysphagia or aspiration. She requires cueing and increased wait time to initiate feeding, but is adequately able to self-feed given set up assistance. Suspect pt's cognition greatly impacts her interest in eating/drinking. Pt exhibits adequate ability to thoroughly masticate and initiate swallowing sequence given cueing. She did frequently belch after each swallow; suspect esophageal component. Recommend initiating diet of Dys 3 textures with thin liquids and meds given whole in puree. Pt will likely require full supervision to ensure adequate nutrition with frequent prompting and redirections to task and may benefit from increased support later in the day. No further SLP f/u is needed at this time.    HPI HPI: Cheryl Monroe is an 88 yo female presenting to ED 8/10 with leg/hand swelling and AMS. Seen by SLP 02/05/23 with concern for silent aspiration. MBS completed 02/06/23 with presbyphagia and no penetration/aspiration. At that time, a regular diet with thin liquids was recommended and no further f/u. CXR with streaky hazy opacity in mid and lower lungs bilaterally. CTH unremarkable. PMH includes HTN, advanced dementia, orthostatic hypotension, CVA with residual mild expressive aphasia, recurrent UTIs, urinary incontinence, aphasia      SLP Plan  All goals met      Recommendations for follow up therapy are one component of a multi-disciplinary discharge planning process, led by the attending physician.  Recommendations may be updated based on patient status, additional functional criteria and insurance authorization.     Recommendations  Diet recommendations: Dysphagia 3 (mechanical soft);Thin liquid Liquids provided via: Cup Medication Administration: Whole meds with puree Supervision: Staff to assist with self feeding;Full supervision/cueing for compensatory strategies Compensations: Minimize environmental distractions;Slow rate;Small sips/bites Postural Changes and/or Swallow Maneuvers: Seated upright 90 degrees;Upright 30-60 min after meal                  Oral care BID   Frequent or constant Supervision/Assistance Dysphagia, unspecified (R13.10)     All goals met     Gwynneth Aliment, M.A., CF-SLP Speech Language Pathology, Acute Rehabilitation Services  Secure Chat preferred [phone]   06/04/2023, 9:48 AM

## 2023-06-04 NOTE — Progress Notes (Signed)
Progress Note    Cheryl Monroe   ZOX:[id number]  DOB: 09/07/1935  DOA: 05/31/2023     3 PCP: Mahlon Gammon, MD  Initial CC: AMS  Hospital Course: Cheryl Monroe is a 88 y.o. female with medical history significant for hypertension, advanced dementia, orthostatic hypotension, recurrent UTIs, urinary incontinence, aphasia, who was brought to the emergency department because of leg and hand swelling and change in mental status.  She's being treated for UTI and community acquired pneumonia.  Interval History:  No events overnight.  Resting in bed comfortable when seen.  Follows commands easily but only oriented to name.  Assessment and Plan:  Acute Metabolic Encephalopathy Dementia  Delirium Suspect this represents delirium in setting of infection  Head CT without acute abnormality Delirium precautions - suspect not far from baseline and would be slow to improve   Community Acquired Pneumonia CXR can't rule out aspiration or pneumonia; CXR unimpressive - s/p 5 das Rocephin   Yeast UTI Unclear if this represents true infection, but will treat given persistent encephalopathy. Will plan for 7 days fluconazole   Hypertension: Continue losartan and hydralazine as needed..   General Weakness: she's wheelchair/bed bound at baseline.  Therapy.   Old records reviewed in assessment of this patient  Antimicrobials: Rocephin 8/10 >> 8/14 Fluconazole 8/12 >> current   DVT prophylaxis:  enoxaparin (LOVENOX) injection 40 mg Start: 06/01/23 1000   Code Status:   Code Status: DNR  Mobility Assessment (Last 72 Hours)     Mobility Assessment     Row Name 06/04/23 1000 06/03/23 2000 06/03/23 1452 06/02/23 1920 06/02/23 0845   Does patient have an order for bedrest or is patient medically unstable No - Continue assessment No - Continue assessment No - Continue assessment No - Continue assessment No - Continue assessment   What is the highest level of mobility based on the  progressive mobility assessment? Level 1 (Bedfast) - Unable to balance while sitting on edge of bed Level 2 (Chairfast) - Balance while sitting on edge of bed and cannot stand Level 2 (Chairfast) - Balance while sitting on edge of bed and cannot stand Level 2 (Chairfast) - Balance while sitting on edge of bed and cannot stand Level 2 (Chairfast) - Balance while sitting on edge of bed and cannot stand   Is the above level different from baseline mobility prior to current illness? Yes - Recommend PT order Yes - Recommend PT order -- Yes - Recommend PT order Yes - Recommend PT order    Row Name 06/01/23 2100 06/01/23 1716         Does patient have an order for bedrest or is patient medically unstable No - Continue assessment --      What is the highest level of mobility based on the progressive mobility assessment? Level 2 (Chairfast) - Balance while sitting on edge of bed and cannot stand Level 2 (Chairfast) - Balance while sitting on edge of bed and cannot stand      Is the above level different from baseline mobility prior to current illness? Yes - Recommend PT order --               Barriers to discharge:  Disposition Plan:  SNF Status is: Inpt  Objective: Blood pressure (!) 152/73, pulse 71, temperature 98 F (36.7 C), temperature source Oral, resp. rate 16, height 5\' 2"  (1.575 m), weight 71.1 kg, SpO2 98%.  Examination:  Physical Exam Constitutional:      Appearance: Normal  appearance.     Comments: Gross confusion appreciated  HENT:     Head: Normocephalic and atraumatic.     Mouth/Throat:     Mouth: Mucous membranes are moist.  Eyes:     Extraocular Movements: Extraocular movements intact.  Cardiovascular:     Rate and Rhythm: Normal rate and regular rhythm.  Pulmonary:     Effort: Pulmonary effort is normal.     Breath sounds: Normal breath sounds.  Abdominal:     General: Bowel sounds are normal. There is no distension.     Palpations: Abdomen is soft.      Tenderness: There is no abdominal tenderness.  Musculoskeletal:     Cervical back: Normal range of motion and neck supple.  Skin:    General: Skin is warm.  Neurological:     Mental Status: She is alert. Mental status is at baseline.      Consultants:    Procedures:    Data Reviewed: No results found for this or any previous visit (from the past 24 hour(s)).  I have reviewed pertinent nursing notes, vitals, labs, and images as necessary. I have ordered labwork to follow up on as indicated.  I have reviewed the last notes from staff over past 24 hours. I have discussed patient's care plan and test results with nursing staff, CM/SW, and other staff as appropriate.  Time spent: Greater than 50% of the 55 minute visit was spent in counseling/coordination of care for the patient as laid out in the A&P.   LOS: 3 days   Lewie Chamber, MD [facility] 06/04/2023, 1:03 PM

## 2023-06-04 NOTE — Hospital Course (Addendum)
Cheryl Monroe is a 88 y.o. female with medical history significant for hypertension, advanced dementia, orthostatic hypotension, recurrent UTIs, urinary incontinence, aphasia, who was brought to the emergency department because of leg and hand swelling and change in mental status.  She's being treated for UTI and community acquired pneumonia.

## 2023-06-04 NOTE — Progress Notes (Signed)
Physical Therapy Treatment Patient Details Name: Cheryl Monroe MRN: [id number] DOB: 15-May-1935 Today's Date: 06/04/2023   History of Present Illness Pt is an 88 y.o. female admitted from nursing home on 05/31/23 with AMS. Workup for acute metabolic encephalopathy, UTI, PNA, hypertensive urgency. PMH includes Alzheimer's disease, aphasia, OA, orthostatic hypotension, recurrent UTIs.    PT Comments  Pt received in supine, RN clearance for session given pt cognitive deficit at baseline, pt pleasantly confused with difficult to understand speech (low volume, dysarthric vs non-sensical) and with good participation in therapy session, responding to simple 1-step motor commands. Pt benefits from multimodal cues and Errorless Learning strategy. Pt needing up to maxA for bed mobility and demonstrates poor progressing to Fair seated balance with intermittent L lean. Pt attempted sit<>stand x2 trials but unable to achieve full upright posture. Pt with inconsistent report of previous mobility, per chart review was typically OOB to chair via hoyer but seems familiar with RW use. Pt with good participation in supine/seated BLE exercises, needed posterior peri-care due to x2 episodes of bowel incontinence during session (prior to sitting EOB and after standing attempt). Pt continues to benefit from PT services to progress toward functional mobility goals.     If plan is discharge home, recommend the following: Two people to help with walking and/or transfers;A lot of help with bathing/dressing/bathroom;Assistance with feeding;Assist for transportation;Supervision due to cognitive status;Help with stairs or ramp for entrance;Direct supervision/assist for medications management;Direct supervision/assist for financial management   Can travel by private vehicle     No (previous therapist response was error; pt non-ambulatory)  Equipment Recommendations  None recommended by PT    Recommendations for Other Services        Precautions / Restrictions Precautions Precautions: Fall Restrictions Weight Bearing Restrictions: No     Mobility  Bed Mobility Overal bed mobility: Needs Assistance Bed Mobility: Rolling, Sidelying to Sit, Sit to Sidelying Rolling: Max assist, Used rails Sidelying to sit: Max assist, HOB elevated, Used rails     Sit to sidelying: Max assist, Used rails General bed mobility comments: fair movement initiation with cues, maxA for BLE management and trunk raising, scooting hips to EOB, maxA for LE management return to supine via partial log roll. Pt rolling to L/R sides x2 prior to sitting up due to BM in supine, but again had BM while sitting EOB and needed to roll x2 again after return to supine with NT assist for rolling/peri-care.    Transfers Overall transfer level: Needs assistance Equipment used: Rolling walker (2 wheels) Transfers: Sit to/from Stand Sit to Stand: Total assist, From elevated surface           General transfer comment: attempted 2x trials standing from EOB to RW, pt requiring max verbal/tactile cues for hand placement, partial initiation of forward weight shift onto BLE, but pt maintains flexed trunk and knees and does not raise hips >1" off bed. Ultimately unable to stand despite maxA x2 attempts. Pt totalA for lateral seated scoot toward HOB, unable to follow cues for shifting hips.    Ambulation/Gait                   Stairs             Wheelchair Mobility     Tilt Bed    Modified Rankin (Stroke Patients Only)       Balance Overall balance assessment: Needs assistance Sitting-balance support: No upper extremity supported, Feet unsupported Sitting balance-Leahy Scale: Fair Sitting balance - Comments:  once assisted to scoot hips forward, pt able to maintain prolonged sitting balance at edge of bed; assist to don/doff bilateral shoes; L lean initially improves after pillow placed under L elbow Postural control: Left  lateral lean   Standing balance-Leahy Scale: Zero Standing balance comment: totalA for attempts x2, unable to stand fully upright (~10-50% upright)                            Cognition Arousal: Alert Behavior During Therapy: WFL for tasks assessed/performed Overall Cognitive Status: History of cognitive impairments - at baseline                                 General Comments: h/o advanced dementia. mumbling speech difficult to understand, intermittently appropriate and intelligble responses in conversation; following simple commands and performing some automatic tasks with min cues. Pt assisted to take a few bits of her food but needs cues to swallow and seems to swallow better with a sip of water. Pt does not like taste of salty foods and doesn't eat much of it but enjoys chocolate pudding, NT notified what worked well to get her to eat.        Exercises Other Exercises Other Exercises: supine BLE A/AAROM: tactile cues due to dementia but good initiation: SLR, heel slides, hip abduction, ankle pumps x10 reps ea Other Exercises: seated BLE AROM: LAQ, hip flexion x10 reps ea    General Comments General comments (skin integrity, edema, etc.): x2 BMs during session, NT called to room each time to assist; pt with bladder and bowel incontinence, but able to notify staff when second BM occuring.      Pertinent Vitals/Pain Pain Assessment Pain Assessment: PAINAD Breathing: normal Negative Vocalization: occasional moan/groan, low speech, negative/disapproving quality Facial Expression: facial grimacing Body Language: relaxed Consolability: distracted or reassured by voice/touch PAINAD Score: 4 Pain Location: RUE (pt pointing to biceps, but says "wrist") when rolling to her R side. Later in session, pt using RUE and AROM without complaint Pain Descriptors / Indicators: Grimacing, Discomfort Pain Intervention(s): Limited activity within patient's tolerance,  Monitored during session, Repositioned     PT Goals (current goals can now be found in the care plan section) Acute Rehab PT Goals Patient Stated Goal: to go back to SNF, eat better food PT Goal Formulation: Patient unable to participate in goal setting Time For Goal Achievement: 06/15/23 Progress towards PT goals: Progressing toward goals    Frequency    Min 1X/week      PT Plan         AM-PAC PT "6 Clicks" Mobility   Outcome Measure  Help needed turning from your back to your side while in a flat bed without using bedrails?: A Lot Help needed moving from lying on your back to sitting on the side of a flat bed without using bedrails?: Total Help needed moving to and from a bed to a chair (including a wheelchair)?: Total Help needed standing up from a chair using your arms (e.g., wheelchair or bedside chair)?: Total Help needed to walk in hospital room?: Total Help needed climbing 3-5 steps with a railing? : Total 6 Click Score: 7    End of Session Equipment Utilized During Treatment: Gait belt Activity Tolerance: Patient tolerated treatment well Patient left: in bed;with call bell/phone within reach;with bed alarm set;Other (comment) (heels floated, HOB >30* for reduced risk  of aspiration, purewick intact) Nurse Communication: Mobility status;Need for lift equipment;Other (comment) (hoyer baseline; b/b incontinence) PT Visit Diagnosis: Other abnormalities of gait and mobility (R26.89);Muscle weakness (generalized) (M62.81)     Time: 1914-7829 PT Time Calculation (min) (ACUTE ONLY): 38 min  Charges:    $Therapeutic Exercise: 23-37 mins $Therapeutic Activity: 8-22 mins PT General Charges $$ ACUTE PT VISIT: 1 Visit                     Shannen Flansburg P., PTA Acute Rehabilitation Services Secure Chat Preferred 9a-5:30pm Office: [phone]    Dorathy Kinsman [facility] 06/04/2023, 4:17 PM

## 2023-06-05 DIAGNOSIS — I10 Essential (primary) hypertension: Secondary | ICD-10-CM

## 2023-06-05 DIAGNOSIS — N390 Urinary tract infection, site not specified: Secondary | ICD-10-CM | POA: Diagnosis not present

## 2023-06-05 DIAGNOSIS — G309 Alzheimer's disease, unspecified: Secondary | ICD-10-CM | POA: Diagnosis not present

## 2023-06-05 DIAGNOSIS — G9341 Metabolic encephalopathy: Secondary | ICD-10-CM | POA: Diagnosis not present

## 2023-06-05 MED ORDER — AMLODIPINE BESYLATE 5 MG PO TABS: 5.0000 mg | ORAL_TABLET | Freq: Every day | ORAL | Status: DC

## 2023-06-05 MED ORDER — AMLODIPINE BESYLATE 10 MG PO TABS
10.0000 mg | ORAL_TABLET | Freq: Every day | ORAL | Status: DC
Start: 2023-06-06 — End: 2023-06-05

## 2023-06-05 MED ORDER — FLUCONAZOLE 100 MG PO TABS: 100.0000 mg | ORAL_TABLET | Freq: Every day | ORAL | Status: DC

## 2023-06-05 NOTE — Progress Notes (Signed)
Report called in and received by Deanna Artis, RN at [facility].

## 2023-06-05 NOTE — TOC Transition Note (Addendum)
Transition of Care [facility]) - CM/SW Discharge Note   Patient Details  Name: Cheryl Monroe MRN: [id number] Date of Birth: 01/26/1935  [facility]) CM/SW Contact:  Ralene Bathe, LCSW Phone Number: 06/05/2023, 1:35 PM   Clinical Narrative:    Patient will DC to: [facility] Anticipated DC date: 06/05/2023 Family notified: Yes Transport by: Sharin Mons   Per MD patient ready for DC to ALF. RN to call report prior to discharge [phone] or [phone]). RN,  patient's family, and facility (LM for [facility] and Facilities manager) notified of DC. Discharge Summary and FL2 sent to facility.  Ambulance transport will be requested for patient.   CSW will sign off for now as social work intervention is no longer needed. Please consult [place] again if new needs arise.    Final next level of care: Assisted Living Barriers to Discharge: No Barriers Identified   Patient Goals and CMS Choice      Discharge Placement                Patient chooses bed at:  ([facility]) Patient to be transferred to facility by: [facility] Name of family member notified: Son, Samone Disandro Patient and family notified of of transfer: 06/05/23  Discharge Plan and Services Additional resources added to the After Visit Summary for                                       Social Determinants of Health (SDOH) Interventions SDOH Screenings   Depression (PHQ2-9): Low Risk  (12/05/2022)  Tobacco Use: Low Risk  (05/31/2023)     Readmission Risk Interventions    02/11/2023    4:11 PM  Readmission Risk Prevention Plan  Post Dischage Appt Complete  Medication Screening Complete  Transportation Screening Complete

## 2023-06-05 NOTE — Discharge Summary (Addendum)
Physician Discharge Summary   Patient: Cheryl Monroe MRN: [id number] DOB: Nov 07, 1934  Admit date:     05/31/2023  Discharge date: 06/05/23  Discharge Physician: Meredeth Ide   PCP: Mahlon Gammon, MD   Recommendations at discharge:   Dysphagia 3 diet Start amlodipine 10 mg daily from tomorrow morning/06/06/2023 Continue fluconazole 100 mg p.o. daily for 3 more days  Discharge Diagnoses: Principal Problem:   Acute UTI Active Problems:   Essential hypertension   Acute metabolic encephalopathy   Alzheimer's dementia without behavioral disturbance (HCC)  Resolved Problems:   * No resolved hospital problems. *  Hospital Course: Cheryl Monroe is a 88 y.o. female with medical history significant for hypertension, advanced dementia, orthostatic hypotension, recurrent UTIs, urinary incontinence, aphasia, who was brought to the emergency department because of leg and hand swelling and change in mental status.  She's being treated for UTI and community acquired pneumonia.  Assessment and Plan:  Community-acquired pneumonia -Chest x-ray could not rule out aspiration or pneumonia -Completed 5 days of antibiotics with Rocephin -Currently not requiring oxygen  Yeast UTI -Started on Diflucan 100 mg p.o. daily for 7 days, starting 06/02/2023 -Will discharge on Diflucan 100 mg p.o. daily for 3 more days  Delirium/underlying dementia -Mental status improved, back to baseline -Head CT showed no acute abnormality  Hypertensive urgency -She was started on Nitropaste 15 mg every 6 hours -Will discontinue nitroglycerin -Blood pressure has improved -Continue losartan, will add amlodipine 5 mg daily  Generalized weakness -Patient is wheelchair-bound, at baseline        Consultants:  Procedures performed:   Disposition: Skilled nursing facility Diet recommendation: Dysphagia 3 diet Discharge Diet Orders (From admission, onward)     Start     Ordered   06/05/23 0000  Diet - low  sodium heart healthy        06/05/23 1145           Dysphagia type 3 thin Liquid DISCHARGE MEDICATION: Allergies as of 06/05/2023       Reactions   Sulfa Antibiotics Rash   Rash to trunk, legs, neck, scalp, and arms        Medication List     TAKE these medications    amLODipine 5 MG tablet Commonly known as: NORVASC Take 1 tablet (5 mg total) by mouth daily.   fluconazole 100 MG tablet Commonly known as: DIFLUCAN Take 1 tablet (100 mg total) by mouth daily for 3 days. Start taking on: June 06, 2023   hydrALAZINE 10 MG tablet Commonly known as: APRESOLINE Take 10 mg by mouth every 8 (eight) hours as needed (SBP > 180).   losartan 25 MG tablet Commonly known as: COZAAR Take 25 mg by mouth daily.   nitrofurantoin 50 MG capsule Commonly known as: MACRODANTIN Take 50 mg by mouth 2 (two) times daily.   polyethylene glycol 17 g packet Commonly known as: MIRALAX / GLYCOLAX Take 17 g by mouth every other day.   senna 8.6 MG Tabs tablet Commonly known as: SENOKOT Take 17.2 mg by mouth at bedtime.   THERACRAN PO Take 250 mg by mouth 2 (two) times daily.   Tylenol Dissolve Packs 500 MG Pack Generic drug: Acetaminophen Take 2 packets by mouth in the morning.        Discharge Exam: Filed Weights   05/31/23 1411  Weight: 71.1 kg   General-appears in no acute distress Heart-S1-S2, regular, no murmur auscultated Lungs-clear to auscultation bilaterally, no wheezing or crackles auscultated Abdomen-soft, nontender,  no organomegaly Extremities-no edema in the lower extremities Neuro-alert, oriented to self only  Condition at discharge: fair  The results of significant diagnostics from this hospitalization (including imaging, microbiology, ancillary and laboratory) are listed below for reference.   Imaging Studies: CT HEAD WO CONTRAST ( )  Result Date: 05/31/2023 CLINICAL DATA:  Mental status change, unknown cause EXAM: CT HEAD WITHOUT CONTRAST  TECHNIQUE: Contiguous axial images were obtained from the base of the skull through the vertex without intravenous contrast. RADIATION DOSE REDUCTION: This exam was performed according to the departmental dose-optimization program which includes automated exposure control, adjustment of the mA and/or kV according to patient size and/or use of iterative reconstruction technique. COMPARISON:  CT head 06/03/2022 FINDINGS: Brain: Patchy and confluent areas of decreased attenuation are noted throughout the deep and periventricular white matter of the cerebral hemispheres bilaterally, compatible with chronic microvascular ischemic disease. No evidence of large-territorial acute infarction. No parenchymal hemorrhage. No mass lesion. No extra-axial collection. No mass effect or midline shift. No hydrocephalus. Basilar cisterns are patent. Vascular: No hyperdense vessel. Atherosclerotic calcifications are present within the cavernous internal carotid, and vertebral arteries. Skull: No acute fracture or focal lesion. Sinuses/Orbits: Paranasal sinuses and mastoid air cells are clear. Bilateral lens replacement. The orbits are unremarkable. Other: None. IMPRESSION: No acute intracranial abnormality. Electronically Signed   By: Tish Frederickson M.D.   On: 05/31/2023 19:11   DG Chest Portable 1 View  Result Date: 05/31/2023 CLINICAL DATA:  Weakness EXAM: PORTABLE CHEST 1 VIEW COMPARISON:  02/15/2023 chest radiograph. FINDINGS: Stable cardiomediastinal silhouette with normal heart size. No pneumothorax. No pleural effusion. Streaky hazy opacity in the mid and lower lungs bilaterally. IMPRESSION: Streaky hazy opacity in the mid and lower lungs bilaterally, cannot exclude aspiration or pneumonia. Follow-up chest radiographs advised. Electronically Signed   By: Delbert Phenix M.D.   On: 05/31/2023 15:16    Microbiology: Results for orders placed or performed during the hospital encounter of 05/31/23  Urine Culture     Status:  Abnormal   Collection Time: 05/31/23  3:06 PM   Specimen: Urine, Random  Result Value Ref Range Status   Specimen Description URINE, RANDOM  Final   Special Requests   Final    NONE Reflexed from [id number] Performed at [facility], [address] [place], [place] [address]    Culture 70,000 COLONIES/mL YEAST (A)  Final   Report Status 06/02/2023 FINAL  Final    Labs: CBC: Recent Labs  Lab 05/31/23 1506 06/01/23 0219 06/03/23 0212  WBC 7.3 8.4 7.3  NEUTROABS  --   --  4.7  HGB 14.3 12.7 13.3  HCT 46.0 40.0 41.6  MCV 88.6 91.5 86.0  PLT 211 197 195   Basic Metabolic Panel: Recent Labs  Lab 05/31/23 1506 06/01/23 0219 06/03/23 0212  NA 137 139  --   K 4.0 5.0  --   CL 100 104  --   CO2 28 25  --   GLUCOSE 95 91  --   BUN 17 13  --   CREATININE 0.72 0.62  --   CALCIUM 9.4 9.0  --   MG 2.1  --  1.9  PHOS  --   --  3.4   Liver Function Tests: No results for input(s): "AST", "ALT", "ALKPHOS", "BILITOT", "PROT", "ALBUMIN" in the last 168 hours. CBG: No results for input(s): "GLUCAP" in the last 168 hours.  Discharge time spent: greater than 30 minutes.  Signed: Meredeth Ide, MD [facility] 06/05/2023

## 2023-06-05 NOTE — NC FL2 (Signed)
[place] MEDICAID FL2 LEVEL OF CARE FORM     IDENTIFICATION  Patient Name: Cheryl Monroe Birthdate: [birth year] Sex: female Admission Date (Current Location): 05/31/2023  [facility] and [place] Number:  Producer, television/film/video and Address:  [facility] [place], [address] [place], [place] [address]      Provider Number: [id number]  Attending Physician Name and Address:  Meredeth Ide, MD  Relative Name and Phone Number:  Lugarda, Ignacio [phone])  [phone]    Current Level of Care: Hospital Recommended Level of Care: Assisted Living Facility Prior Approval Number:    Date Approved/Denied:   PASRR Number:    Discharge Plan: Other (Comment) (Assisted Living facility)    Current Diagnoses: Patient Active Problem List   Diagnosis Date Noted   Acute UTI 05/31/2023   CAP (community acquired pneumonia) 02/05/2023   Aphasia 02/05/2023   Neurogenic bladder 04/18/2022   Acute metabolic encephalopathy 08/02/2021   Word finding difficulty 02/22/2021   Alzheimer's dementia without behavioral disturbance (HCC) 09/10/2020   History of COVID-19 07/26/2020   Orthostatic dizziness 07/19/2020   Balance problem 07/19/2020   Constipation 07/19/2020   Clitoral irritation 07/19/2020   Asymptomatic bacteriuria 07/19/2020   Urinary frequency 07/19/2020   Urge incontinence 07/19/2020   Weakness of left lower extremity 07/19/2020   Essential hypertension 07/19/2020   Weakness 07/19/2020   Impaired mobility 07/19/2020   Recurrent UTI 07/19/2020   Dementia without behavioral disturbance (HCC) 07/19/2020   Edema 07/19/2020   Spinal stenosis 07/19/2020    Orientation RESPIRATION BLADDER Height & Weight     Self  Normal Incontinent Weight: 156 lb 12 oz (71.1 kg) Height:  5\' 2"  (157.5 cm)  BEHAVIORAL SYMPTOMS/MOOD NEUROLOGICAL BOWEL NUTRITION STATUS      Incontinent Diet (dusphagia 3)  AMBULATORY STATUS COMMUNICATION OF NEEDS Skin   Total Care Verbally Normal                        Personal Care Assistance Level of Assistance  Dressing, Feeding, Bathing Bathing Assistance: Maximum assistance Feeding assistance: Limited assistance Dressing Assistance: Maximum assistance     Functional Limitations Info  Sight, Hearing, Speech Sight Info: Adequate Hearing Info: Adequate Speech Info: Impaired    SPECIAL CARE FACTORS FREQUENCY  PT (By licensed PT), OT (By licensed OT)     PT Frequency: 3x/ week OT Frequency: 3x/ week            Contractures Contractures Info: Not present    Additional Factors Info  Code Status, Allergies Code Status Info: DNR Allergies Info: Sulfa Antibiotics           Current Medications (06/05/2023):  This is the current hospital active medication list Current Facility-Administered Medications  Medication Dose Route Frequency Provider Last Rate Last Admin   acetaminophen (TYLENOL) tablet 650 mg  650 mg Oral Q6H PRN Lurene Shadow, MD   650 mg at 06/02/23 2049   Or   acetaminophen (TYLENOL) suppository 650 mg  650 mg Rectal Q6H PRN Lurene Shadow, MD       enoxaparin (LOVENOX) injection 40 mg  40 mg Subcutaneous Q24H Lurene Shadow, MD   40 mg at 06/05/23 0900   fluconazole (DIFLUCAN) tablet 100 mg  100 mg Oral Daily Zigmund Daniel., MD   100 mg at 06/05/23 0900   hydrALAZINE (APRESOLINE) tablet 10 mg  10 mg Oral Q8H PRN Lurene Shadow, MD       losartan (COZAAR) tablet 25 mg  25 mg Oral Daily Lurene Shadow, MD   25 mg at 06/05/23 0900   ondansetron (ZOFRAN) tablet 4 mg  4 mg Oral Q6H PRN Lurene Shadow, MD       Or   ondansetron [facility]) injection 4 mg  4 mg Intravenous Q6H PRN Lurene Shadow, MD       polyethylene glycol (MIRALAX / GLYCOLAX) packet 17 g  17 g Oral BID Zigmund Daniel., MD   17 g at 06/05/23 0901   senna (SENOKOT) tablet 17.2 mg  2 tablet Oral QHS Lurene Shadow, MD   17.2 mg at 06/04/23 2202     Discharge Medications: amLODipine 5 MG tablet Commonly known as: NORVASC Take 1  tablet (5 mg total) by mouth daily.    fluconazole 100 MG tablet Commonly known as: DIFLUCAN Take 1 tablet (100 mg total) by mouth daily for 3 days. Start taking on: June 06, 2023    hydrALAZINE 10 MG tablet Commonly known as: APRESOLINE Take 10 mg by mouth every 8 (eight) hours as needed (SBP > 180).    losartan 25 MG tablet Commonly known as: COZAAR Take 25 mg by mouth daily.    nitrofurantoin 50 MG capsule Commonly known as: MACRODANTIN Take 50 mg by mouth 2 (two) times daily.    polyethylene glycol 17 g packet Commonly known as: MIRALAX / GLYCOLAX Take 17 g by mouth every other day.    senna 8.6 MG Tabs tablet Commonly known as: SENOKOT Take 17.2 mg by mouth at bedtime.    THERACRAN PO Take 250 mg by mouth 2 (two) times daily.    Tylenol Dissolve Packs 500 MG Pack Generic drug: Acetaminophen Take 2 packets by mouth in the morning.    Relevant Imaging Results:  Relevant Lab Results:   Additional Information SSN: [id number];  Ardith Lewman F Drenda Sobecki, LCSW

## 2023-06-05 NOTE — Plan of Care (Signed)
  Problem: Health Behavior/Discharge Planning: Goal: Ability to manage health-related needs will improve Outcome: Adequate for Discharge   Problem: Clinical Measurements: Goal: Ability to maintain clinical measurements within normal limits will improve Outcome: Adequate for Discharge Goal: Will remain free from infection Outcome: Adequate for Discharge Goal: Diagnostic test results will improve Outcome: Adequate for Discharge Goal: Respiratory complications will improve Outcome: Adequate for Discharge Goal: Cardiovascular complication will be avoided Outcome: Adequate for Discharge   Problem: Activity: Goal: Risk for activity intolerance will decrease Outcome: Adequate for Discharge   Problem: Nutrition: Goal: Adequate nutrition will be maintained Outcome: Adequate for Discharge   Problem: Coping: Goal: Level of anxiety will decrease Outcome: Adequate for Discharge   Problem: Elimination: Goal: Will not experience complications related to bowel motility Outcome: Adequate for Discharge Goal: Will not experience complications related to urinary retention Outcome: Adequate for Discharge   Problem: Pain Managment: Goal: General experience of comfort will improve Outcome: Adequate for Discharge   Problem: Safety: Goal: Ability to remain free from injury will improve Outcome: Adequate for Discharge   Problem: Skin Integrity: Goal: Risk for impaired skin integrity will decrease Outcome: Adequate for Discharge   Problem: Urinary Elimination: Goal: Signs and symptoms of infection will decrease Outcome: Adequate for Discharge   

## 2023-06-06 ENCOUNTER — Non-Acute Institutional Stay (SKILLED_NURSING_FACILITY): Payer: Medicare Other | Admitting: Adult Health

## 2023-06-06 ENCOUNTER — Encounter: Payer: Self-pay | Admitting: Adult Health

## 2023-06-06 DIAGNOSIS — F0390 Unspecified dementia without behavioral disturbance: Secondary | ICD-10-CM

## 2023-06-06 DIAGNOSIS — J189 Pneumonia, unspecified organism: Secondary | ICD-10-CM | POA: Diagnosis not present

## 2023-06-06 DIAGNOSIS — I10 Essential (primary) hypertension: Secondary | ICD-10-CM | POA: Diagnosis not present

## 2023-06-06 DIAGNOSIS — R531 Weakness: Secondary | ICD-10-CM

## 2023-06-06 DIAGNOSIS — R1310 Dysphagia, unspecified: Secondary | ICD-10-CM

## 2023-06-06 DIAGNOSIS — N319 Neuromuscular dysfunction of bladder, unspecified: Secondary | ICD-10-CM

## 2023-06-06 DIAGNOSIS — N390 Urinary tract infection, site not specified: Secondary | ICD-10-CM | POA: Diagnosis not present

## 2023-06-06 DIAGNOSIS — K5901 Slow transit constipation: Secondary | ICD-10-CM

## 2023-06-06 DIAGNOSIS — M4800 Spinal stenosis, site unspecified: Secondary | ICD-10-CM

## 2023-06-06 NOTE — Progress Notes (Signed)
Location:  [facility] illustr[facility] of Service:  SNF (31) Provider:   Peggye Ley, ANP [facility] [phone]   Cheryl Gammon, MD  Patient Care Team: Cheryl Gammon, MD as PCP - General (Internal Medicine) Cheryl Neighbor, MD (Urology)  Extended Emergency Contact Information Primary Emergency Contact: Monroe,Cheryl Address: [address]          [place], [place] [address] [place] Cheryl of [place] Home Phone: [phone] Work Phone: [phone] Relation: Son Secondary Emergency Contact: Monroe,Cheryl  [place] Mobile Phone: [phone] Relation: Daughter  Code Status:  DNR Goals of care: Advanced Directive information    05/31/2023    2:12 PM  Advanced Directives  Does Patient Have a Medical Advance Directive? No     Chief Complaint  Patient presents with   f/u hospitalization    HPI:  Pt is a 88 y.o. female seen today for a hospital f/u s/p admission from 05/31/23-06/05/23. She was admitted with AMS and hand/leg swelling.  PMH significant for dementia, HTN, orthostatic hypotension. Aphasia, spinal stenosis, recurrent UTI, neurogenic bladder, and constipation.  CXR 05/31/23 showed streaky hazy opacity in the mid and lower lungs bilaterally.  UA reveal large amt of bacteria and WBCs and grew yeast.   She was treated with IV Rocephin for pna and diflucan for UTI Speech therapy evaluated her indicated that she needed a D3 diet and asp prec.  Swallow study in 02/06/23 showed presbyphagia, no overt aspiration.  CT of the head showed no acute abnormality. Mental status improved during her stay  Her BP was elevated and hydralazine and nitroglycerin paste were used to bring it down. Norvasc was restarted. In the past she ends up with low bp after taking this.   She was not ambulatory prior to this admission and required assistance for all ADLs. Has some aphasia. Very pleasant with no behaviors. Not able to  complete MMSE.   The nurse reports this am she is not coughing or having trouble breathing. Ate breakfast without difficulty.    Past Medical History:  Diagnosis Date   Alzheimer disease (HCC)    Balance problem 07/19/2020   Constipation 07/19/2020   Dementia without behavioral disturbance (HCC) 07/19/2020   MMSE 21/30 07/21/20   Fall    Osteoarthritis    Pyelonephritis    Spinal stenosis 07/19/2020   Weakness of left lower extremity 07/19/2020   History reviewed. No pertinent surgical history.  Allergies  Allergen Reactions   Sulfa Antibiotics Rash    Rash to trunk, legs, neck, scalp, and arms    Outpatient Encounter Medications as of 06/06/2023  Medication Sig   Acetaminophen (TYLENOL DISSOLVE PACKS) 500 MG PACK Take 2 packets by mouth in the morning.   amLODipine (NORVASC) 5 MG tablet Take 1 tablet (5 mg total) by mouth daily.   Cranberry (THERACRAN PO) Take 250 mg by mouth 2 (two) times daily.   fluconazole (DIFLUCAN) 100 MG tablet Take 1 tablet (100 mg total) by mouth daily for 3 days.   hydrALAZINE (APRESOLINE) 10 MG tablet Take 10 mg by mouth every 8 (eight) hours as needed (SBP > 180).   losartan (COZAAR) 25 MG tablet Take 25 mg by mouth daily.   nitrofurantoin (MACRODANTIN) 50 MG capsule Take 50 mg by mouth 2 (two) times daily.   polyethylene glycol (MIRALAX / GLYCOLAX) 17 g packet Take 17 g by mouth every other day.   senna (SENOKOT) 8.6 MG TABS tablet  Take 17.2 mg by mouth at bedtime.   No facility-administered encounter medications on file as of 06/06/2023.    Review of Systems  Unable to perform ROS: Dementia    Immunization History  Administered Date(s) Administered   Influenza, High Dose Seasonal PF 07/25/2021   Influenza-Unspecified 08/05/2018, 09/29/2018, 07/02/2019, 08/21/2019, 08/11/2020, 07/26/2022   Moderna Covid-19 Vaccine Bivalent Booster 54yrs & up 08/01/2021, 08/26/2022, 02/13/2023   Moderna SARS-COV2 Booster Vaccination 08/31/2020, 06/12/2021    Moderna Sars-Covid-2 Vaccination 11/04/2019, 12/02/2019   Pneumococcal Conjugate-13 09/28/2018   Pneumococcal Polysaccharide-23 10/21/2017   Tdap 02/23/2021   Zoster Recombinant(Shingrix) 05/24/2021, 07/24/2021   Pertinent  Health Maintenance Due  Topic Date Due   INFLUENZA VACCINE  05/22/2023   DEXA SCAN  Discontinued      06/03/2022    3:33 PM 10/01/2022    1:09 PM 11/05/2022   10:43 AM 12/05/2022    1:51 PM 02/17/2023    8:50 AM  Fall Risk  Falls in the past year?  0 0 0 0  Was there an injury with Fall?  0 0 0 0  Fall Risk Category Calculator  0 0 0 0  Fall Risk Category (Retired)  Low     (RETIRED) Patient Fall Risk Level High fall risk High fall risk     Patient at Risk for Falls Due to  History of fall(s) History of fall(s) History of fall(s) No Fall Risks  Fall risk Follow up  Falls evaluation completed Falls evaluation completed Falls evaluation completed Falls evaluation completed   Functional Status Survey:    Vitals:   06/06/23 1607  BP: (!) 166/80  Pulse: 73  Resp: 17  Temp: 97.9 F (36.6 C)  SpO2: 95%   There is no height or weight on file to calculate BMI. Physical Exam Vitals and nursing note reviewed.  Constitutional:      General: She is not in acute distress.    Appearance: She is not diaphoretic.  HENT:     Head: Normocephalic and atraumatic.     Nose: Nose normal.     Mouth/Throat:     Mouth: Mucous membranes are moist.     Pharynx: Oropharynx is clear.  Eyes:     Conjunctiva/sclera: Conjunctivae normal.     Pupils: Pupils are equal, round, and reactive to light.  Neck:     Vascular: No JVD.  Cardiovascular:     Rate and Rhythm: Normal rate and regular rhythm.     Heart sounds: No murmur heard. Pulmonary:     Effort: Pulmonary effort is normal. No respiratory distress.     Breath sounds: Normal breath sounds. No wheezing.  Abdominal:     General: Bowel sounds are normal. There is no distension.     Palpations: Abdomen is soft.      Tenderness: There is no abdominal tenderness.  Musculoskeletal:     Cervical back: No rigidity or tenderness.     Comments: Trace edema to both lower ext.  Lymphadenopathy:     Cervical: No cervical adenopathy.  Skin:    General: Skin is warm and dry.  Neurological:     General: No focal deficit present.     Mental Status: She is alert. Mental status is at baseline.  Psychiatric:        Mood and Affect: Mood normal.     Labs reviewed: Recent Labs    02/15/23 1448 02/18/23 0000 05/31/23 1506 06/01/23 0219 06/03/23 0212  NA 136 139 137 139  --  K 3.8 4.5 4.0 5.0  --   CL 100 101 100 104  --   CO2 25 28* 28 25  --   GLUCOSE 101*  --  95 91  --   BUN 16 9 17 13   --   CREATININE 1.07* 0.6 0.72 0.62  --   CALCIUM 8.8* 9.2 9.4 9.0  --   MG  --   --  2.1  --  1.9  PHOS  --   --   --   --  3.4   Recent Labs    02/04/23 2205 02/15/23 1448 02/18/23 0000  AST 24 27 28   ALT 19 25 25   ALKPHOS 82 79 88  BILITOT 0.9 0.5  --   PROT 6.0* 7.4  --   ALBUMIN 2.7* 3.5 3.8   Recent Labs    02/04/23 2205 02/07/23 0504 02/15/23 1448 05/31/23 1506 06/01/23 0219 06/03/23 0212  WBC 6.5   < > 5.7 7.3 8.4 7.3  NEUTROABS 4.0  --  3.3  --   --  4.7  HGB 12.8   < > 14.2 14.3 12.7 13.3  HCT 40.1   < > 46.1* 46.0 40.0 41.6  MCV 92.0   < > 93.3 88.6 91.5 86.0  PLT 166   < > 251 211 197 195   < > = values in this interval not displayed.   Lab Results  Component Value Date   TSH 0.942 04/25/2022   No results found for: "HGBA1C" Lab Results  Component Value Date   CHOL 235 (A) 07/20/2020   HDL 100 (A) 07/20/2020   LDLCALC 134 07/20/2020   TRIG 102 07/20/2020    Significant Diagnostic Results in last 30 days:  CT HEAD WO CONTRAST ( )  Result Date: 05/31/2023 CLINICAL DATA:  Mental status change, unknown cause EXAM: CT HEAD WITHOUT CONTRAST TECHNIQUE: Contiguous axial images were obtained from the base of the skull through the vertex without intravenous contrast. RADIATION  DOSE REDUCTION: This exam was performed according to the departmental dose-optimization program which includes automated exposure control, adjustment of the mA and/or kV according to patient size and/or use of iterative reconstruction technique. COMPARISON:  CT head 06/03/2022 FINDINGS: Brain: Patchy and confluent areas of decreased attenuation are noted throughout the deep and periventricular white matter of the cerebral hemispheres bilaterally, compatible with chronic microvascular ischemic disease. No evidence of large-territorial acute infarction. No parenchymal hemorrhage. No mass lesion. No extra-axial collection. No mass effect or midline shift. No hydrocephalus. Basilar cisterns are patent. Vascular: No hyperdense vessel. Atherosclerotic calcifications are present within the cavernous internal carotid, and vertebral arteries. Skull: No acute fracture or focal lesion. Sinuses/Orbits: Paranasal sinuses and mastoid air cells are clear. Bilateral lens replacement. The orbits are unremarkable. Other: None. IMPRESSION: No acute intracranial abnormality. Electronically Signed   By: Tish Frederickson M.D.   On: 05/31/2023 19:11   DG Chest Portable 1 View  Result Date: 05/31/2023 CLINICAL DATA:  Weakness EXAM: PORTABLE CHEST 1 VIEW COMPARISON:  02/15/2023 chest radiograph. FINDINGS: Stable cardiomediastinal silhouette with normal heart size. No pneumothorax. No pleural effusion. Streaky hazy opacity in the mid and lower lungs bilaterally. IMPRESSION: Streaky hazy opacity in the mid and lower lungs bilaterally, cannot exclude aspiration or pneumonia. Follow-up chest radiographs advised. Electronically Signed   By: Delbert Phenix M.D.   On: 05/31/2023 15:16    Assessment/Plan 1. Community acquired pneumonia, unspecified laterality Received IV rocephin for 5 days   2. Dysphagia, unspecified type ST during hospitalization  recommended D 3 diet Will consult ST here at [facility]  Asp prec recommended as she has  underlying dementia with fluctuating cognition.   3. Acute UTI Diflucan for 7 days, 3 more doses due at [facility].    4. Essential hypertension Above goal at this time Just started back on Norvasc 5 mg daily but has a hx of orthostatic hypotension Continue hydralazine prn and losartan Monitor bp daily for two weeks  5. Generalized weakness S/p hospitalization Recommend PT OT   6. Dementia without behavioral disturbance (HCC) Progressive decline in cognition and physical function c/w the disease. Continue supportive care in the skilled environment.  7. Neurogenic bladder Hx of retention was on flomax at one time but no longer Continues on macrodantin for UTI prophylaxis Also uses an estrogen ring   8. Slow transit constipation Continue miralax and senokot  9. Spinal stenosis, unspecified spinal region Uses tylenol each monring      Labs/tests ordered:  BMP 8/19

## 2023-06-09 ENCOUNTER — Non-Acute Institutional Stay (SKILLED_NURSING_FACILITY): Payer: Medicare Other | Admitting: Internal Medicine

## 2023-06-09 ENCOUNTER — Encounter: Payer: Self-pay | Admitting: Internal Medicine

## 2023-06-09 DIAGNOSIS — I10 Essential (primary) hypertension: Secondary | ICD-10-CM | POA: Diagnosis not present

## 2023-06-09 DIAGNOSIS — N319 Neuromuscular dysfunction of bladder, unspecified: Secondary | ICD-10-CM

## 2023-06-09 DIAGNOSIS — J189 Pneumonia, unspecified organism: Secondary | ICD-10-CM

## 2023-06-09 DIAGNOSIS — N390 Urinary tract infection, site not specified: Secondary | ICD-10-CM

## 2023-06-09 DIAGNOSIS — F0390 Unspecified dementia without behavioral disturbance: Secondary | ICD-10-CM

## 2023-06-09 DIAGNOSIS — R531 Weakness: Secondary | ICD-10-CM | POA: Diagnosis not present

## 2023-06-09 DIAGNOSIS — R1310 Dysphagia, unspecified: Secondary | ICD-10-CM

## 2023-06-09 NOTE — Progress Notes (Unsigned)
Provider:   [facility] Room Number: 140A Place of Service:  SNF (31)  PCP: Mahlon Gammon, MD Patient Care Team: Mahlon Gammon, MD as PCP - General (Internal Medicine) Jamison Neighbor, MD (Urology)  Extended Emergency Contact Information Primary Emergency Contact: Fadden,[name] Address: [address]          [place], [place] [address] [place] of [place] Home Phone: [phone] Work Phone: [phone] Relation: Son Secondary Emergency Contact: Calarco,Prachi  [place] Mobile Phone: [phone] Relation: Daughter  Code Status: DNR Goals of Care: Advanced Directive information    06/09/2023    8:52 AM  Advanced Directives  Does Patient Have a Medical Advance Directive? Yes  Type of Advance Directive Living will;Out of facility DNR (pink MOST or yellow form)  Does patient want to make changes to medical advance directive? No - Patient declined      Chief Complaint  Patient presents with   Immunizations    Discuss the need for covid and flu vaccine    Readmit To SNF    HPI: Patient is a 88 y.o. female seen today for Readmission to SNF  Admitted in the hospital from 08/10-08/15 for Pneumonia   Lives in SNF in [place]   Patient has a history of Alzheimer's dementia with Aphasia, hypertension, orthostatic hypotension She also has history of recurrent UTIs and urinary incontinence  Also h/o Orthostatic BP with Wide variation SBP Can vary from 170-90  She was send to the hospital for Lethargy and Change in Mental status Was found to have possible Aspiration pneumonia She was treated with Rocephin  Her diet is now D 3 Chopped Patient also had yeast infection in her Urine which was treated with Diflucan Her Mental status improved and now she is at her baseline CT if her head was negative for any acute issues  No Complains by nurses today She has aphasia but pleasant and alert today Stays in  her Wheelchair No Cough or fever or SOB   Past Medical History:  Diagnosis Date   Alzheimer disease (HCC)    Balance problem 07/19/2020   Constipation 07/19/2020   Dementia without behavioral disturbance (HCC) 07/19/2020   MMSE 21/30 07/21/20   Fall    Osteoarthritis    Pyelonephritis    Spinal stenosis 07/19/2020   Weakness of left lower extremity 07/19/2020   History reviewed. No pertinent surgical history.  reports that she has never smoked. She has never used smokeless tobacco. She reports that she does not currently use alcohol. She reports that she does not currently use drugs. Social History   Socioeconomic History   Marital status: Legally Separated    Spouse name: Not on file   Number of children: 3   Years of education: Not on file   Highest education level: Some college, no degree  Occupational History   Not on file  Tobacco Use   Smoking status: Never   Smokeless tobacco: Never  Vaping Use   Vaping status: Never Used  Substance and Sexual Activity   Alcohol use: Not Currently   Drug use: Not Currently   Sexual activity: Not on file  Other Topics Concern   Not on file  Social History Narrative   Not on file   Social Determinants of Health   Financial Resource Strain: Not on file  Food Insecurity: Not on file  Transportation Needs: Not on file  Physical Activity: Not on file  Stress: Not on file  Social Connections: Not on file  Intimate Partner Violence: Not on file    Functional Status Survey:    Family History  Problem Relation Age of Onset   ALS Mother     Health Maintenance  Topic Date Due   COVID-19 Vaccine (6 - 2023-24 season) 04/10/2023   INFLUENZA VACCINE  05/22/2023   Medicare Annual Wellness (AWV)  11/23/2023   DTaP/Tdap/Td (2 - Td or Tdap) 02/24/2031   Pneumonia Vaccine 52+ Years old  Completed   Zoster Vaccines- Shingrix  Completed   HPV VACCINES  Aged Out   DEXA SCAN  Discontinued    Allergies  Allergen Reactions   Sulfa  Antibiotics Rash    Rash to trunk, legs, neck, scalp, and arms    Outpatient Encounter Medications as of 06/09/2023  Medication Sig   Acetaminophen (TYLENOL DISSOLVE PACKS) 500 MG PACK Take 2 packets by mouth in the morning.   amLODipine (NORVASC) 5 MG tablet Take 1 tablet (5 mg total) by mouth daily.   Cranberry (THERACRAN PO) Take 250 mg by mouth 2 (two) times daily.   estradiol (ESTRING) 7.5 MCG/24HR vaginal ring Place 1 each vaginally every 3 (three) months. follow package directions   hydrALAZINE (APRESOLINE) 10 MG tablet Take 10 mg by mouth every 8 (eight) hours as needed (SBP > 180).   losartan (COZAAR) 25 MG tablet Take 25 mg by mouth daily.   nitrofurantoin (MACRODANTIN) 50 MG capsule Take 50 mg by mouth 2 (two) times daily.   polyethylene glycol (MIRALAX / GLYCOLAX) 17 g packet Take 17 g by mouth every other day.   senna (SENOKOT) 8.6 MG TABS tablet Take 17.2 mg by mouth at bedtime.   [DISCONTINUED] fluconazole (DIFLUCAN) 100 MG tablet Take 1 tablet (100 mg total) by mouth daily for 3 days.   No facility-administered encounter medications on file as of 06/09/2023.    Review of Systems  Unable to perform ROS: Dementia    Vitals:   06/09/23 0844  BP: 130/62  Pulse: 74  Resp: 20  Temp: 98.9 F (37.2 C)  TempSrc: Temporal  SpO2: 94%  Weight: 156 lb 12.8 oz (71.1 kg)  Height: 5\' 2"  (1.575 m)   Body mass index is 28.68 kg/m. Physical Exam Vitals reviewed.  Constitutional:      Appearance: Normal appearance.  HENT:     Head: Normocephalic.     Nose: Nose normal.     Mouth/Throat:     Mouth: Mucous membranes are moist.     Pharynx: Oropharynx is clear.  Eyes:     Pupils: Pupils are equal, round, and reactive to light.  Cardiovascular:     Rate and Rhythm: Normal rate and regular rhythm.     Pulses: Normal pulses.     Heart sounds: Murmur heard.  Pulmonary:     Effort: Pulmonary effort is normal.     Breath sounds: Normal breath sounds.     Comments: Few rales  in her Right Lower Lung Abdominal:     General: Abdomen is flat. Bowel sounds are normal.     Palpations: Abdomen is soft.  Musculoskeletal:        General: Swelling present.     Cervical back: Neck supple.     Comments: Has Mild swelling bilateral  Skin:    General: Skin is warm.     Comments: Small Bruise in her Left Hand where her IV was  Neurological:     General: No focal deficit  present.     Mental Status: She is alert.  Psychiatric:        Mood and Affect: Mood normal.        Thought Content: Thought content normal.     Labs reviewed: Basic Metabolic Panel: Recent Labs    02/15/23 1448 02/18/23 0000 05/31/23 1506 06/01/23 0219 06/03/23 0212  NA 136 139 137 139  --   K 3.8 4.5 4.0 5.0  --   CL 100 101 100 104  --   CO2 25 28* 28 25  --   GLUCOSE 101*  --  95 91  --   BUN 16 9 17 13   --   CREATININE 1.07* 0.6 0.72 0.62  --   CALCIUM 8.8* 9.2 9.4 9.0  --   MG  --   --  2.1  --  1.9  PHOS  --   --   --   --  3.4   Liver Function Tests: Recent Labs    02/04/23 2205 02/15/23 1448 02/18/23 0000  AST 24 27 28   ALT 19 25 25   ALKPHOS 82 79 88  BILITOT 0.9 0.5  --   PROT 6.0* 7.4  --   ALBUMIN 2.7* 3.5 3.8   No results for input(s): "LIPASE", "AMYLASE" in the last 8760 hours. Recent Labs    02/15/23 1448  AMMONIA 12   CBC: Recent Labs    02/04/23 2205 02/07/23 0504 02/15/23 1448 05/31/23 1506 06/01/23 0219 06/03/23 0212  WBC 6.5   < > 5.7 7.3 8.4 7.3  NEUTROABS 4.0  --  3.3  --   --  4.7  HGB 12.8   < > 14.2 14.3 12.7 13.3  HCT 40.1   < > 46.1* 46.0 40.0 41.6  MCV 92.0   < > 93.3 88.6 91.5 86.0  PLT 166   < > 251 211 197 195   < > = values in this interval not displayed.   Cardiac Enzymes: No results for input(s): "CKTOTAL", "CKMB", "CKMBINDEX", "TROPONINI" in the last 8760 hours. BNP: Invalid input(s): "POCBNP" No results found for: "HGBA1C" Lab Results  Component Value Date   TSH 0.942 04/25/2022   Lab Results  Component Value Date    VITAMINB12 479 07/20/2020   No results found for: "FOLATE" Lab Results  Component Value Date   IRON 60 11-Feb-1935   TIBC 321 1935-08-31   FERRITIN 67.22 [birth year]    Imaging and Procedures obtained prior to SNF admission: CT HEAD WO CONTRAST ( )  Result Date: 05/31/2023 CLINICAL DATA:  Mental status change, unknown cause EXAM: CT HEAD WITHOUT CONTRAST TECHNIQUE: Contiguous axial images were obtained from the base of the skull through the vertex without intravenous contrast. RADIATION DOSE REDUCTION: This exam was performed according to the departmental dose-optimization program which includes automated exposure control, adjustment of the mA and/or kV according to patient size and/or use of iterative reconstruction technique. COMPARISON:  CT head 06/03/2022 FINDINGS: Brain: Patchy and confluent areas of decreased attenuation are noted throughout the deep and periventricular white matter of the cerebral hemispheres bilaterally, compatible with chronic microvascular ischemic disease. No evidence of large-territorial acute infarction. No parenchymal hemorrhage. No mass lesion. No extra-axial collection. No mass effect or midline shift. No hydrocephalus. Basilar cisterns are patent. Vascular: No hyperdense vessel. Atherosclerotic calcifications are present within the cavernous internal carotid, and vertebral arteries. Skull: No acute fracture or focal lesion. Sinuses/Orbits: Paranasal sinuses and mastoid air cells are clear. Bilateral lens replacement. The orbits are unremarkable. Other:  None. IMPRESSION: No acute intracranial abnormality. Electronically Signed   By: Tish Frederickson M.D.   On: 05/31/2023 19:11   DG Chest Portable 1 View  Result Date: 05/31/2023 CLINICAL DATA:  Weakness EXAM: PORTABLE CHEST 1 VIEW COMPARISON:  02/15/2023 chest radiograph. FINDINGS: Stable cardiomediastinal silhouette with normal heart size. No pneumothorax. No pleural effusion. Streaky hazy opacity in the mid and  lower lungs bilaterally. IMPRESSION: Streaky hazy opacity in the mid and lower lungs bilaterally, cannot exclude aspiration or pneumonia. Follow-up chest radiographs advised. Electronically Signed   By: Delbert Phenix M.D.   On: 05/31/2023 15:16    Assessment/Plan 1. Essential hypertension Norvasc added BP stable Also on Cozaar Her BP does fluctuates   2. Dysphagia, unspecified type Now on D 3 diet Stays at risk of Aspiration due to her cognition Will work with [employer] 3. Community acquired pneumonia, unspecified laterality No Active symptoms  4. Generalized weakness Going to work with therapy  5. Dementia without behavioral disturbance (HCC) SNF level of care Wheelchair dependent and ADLS dependent MRI showed 78/2/95 revealed no evidence of acute intracranial abnormality, Moderate chronic small vessel ischemic changes within the cerebral white matter. Aphasia Worse Continue SNF support 6. Neurogenic bladder   7. Recurrent UTI Macrodantin and Cranberry Did get Diflucan for yeast 8 Unstable gait Worked with therapy Her issue is Cognition and Inability to follow complex Commands Also with Wide fluctuations in her BP she usually stays in chair. 9 Mild Edema in LE If Persists have to think about stopping Norvasc  Family/ staff Communication:   Labs/tests ordered:

## 2023-06-10 LAB — BASIC METABOLIC PANEL
BUN: 11 (ref 4–21)
CO2: 27 — AB (ref 13–22)
Chloride: 102 (ref 99–108)
Creatinine: 0.5 (ref 0.5–1.1)
Glucose: 109
Potassium: 4.2 mEq/L (ref 3.5–5.1)
Sodium: 139 (ref 137–147)

## 2023-06-10 LAB — COMPREHENSIVE METABOLIC PANEL
Calcium: 9.1 (ref 8.7–10.7)
eGFR: 90

## 2023-06-24 ENCOUNTER — Non-Acute Institutional Stay (SKILLED_NURSING_FACILITY): Payer: Medicare Other | Admitting: Orthopedic Surgery

## 2023-06-24 ENCOUNTER — Other Ambulatory Visit: Payer: Self-pay

## 2023-06-24 ENCOUNTER — Emergency Department (HOSPITAL_COMMUNITY): Payer: Medicare Other

## 2023-06-24 ENCOUNTER — Encounter: Payer: Self-pay | Admitting: Orthopedic Surgery

## 2023-06-24 ENCOUNTER — Encounter (HOSPITAL_COMMUNITY): Payer: Self-pay

## 2023-06-24 ENCOUNTER — Emergency Department (HOSPITAL_COMMUNITY)
Admission: EM | Admit: 2023-06-24 | Discharge: 2023-06-25 | Disposition: A | Discharge status: Home / Self Care | Payer: Medicare Other | Attending: Emergency Medicine | Admitting: Emergency Medicine

## 2023-06-24 DIAGNOSIS — K625 Hemorrhage of anus and rectum: Secondary | ICD-10-CM

## 2023-06-24 DIAGNOSIS — Z1152 Encounter for screening for COVID-19: Secondary | ICD-10-CM | POA: Insufficient documentation

## 2023-06-24 DIAGNOSIS — R1310 Dysphagia, unspecified: Secondary | ICD-10-CM

## 2023-06-24 DIAGNOSIS — F0390 Unspecified dementia without behavioral disturbance: Secondary | ICD-10-CM | POA: Diagnosis not present

## 2023-06-24 DIAGNOSIS — R531 Weakness: Secondary | ICD-10-CM | POA: Diagnosis present

## 2023-06-24 LAB — COMPREHENSIVE METABOLIC PANEL
ALT: 41 U/L (ref 0–44)
AST: 25 U/L (ref 15–41)
Albumin: 3.3 g/dL — ABNORMAL LOW (ref 3.5–5.0)
Alkaline Phosphatase: 93 U/L (ref 38–126)
Anion gap: 8 (ref 5–15)
BUN: 17 mg/dL (ref 8–23)
CO2: 28 mmol/L (ref 22–32)
Calcium: 8.9 mg/dL (ref 8.9–10.3)
Chloride: 98 mmol/L (ref 98–111)
Creatinine, Ser: 0.59 mg/dL (ref 0.44–1.00)
GFR, Estimated: >60 mL/min (ref >60)
Glucose, Bld: 105 mg/dL — ABNORMAL HIGH (ref 70–99)
Potassium: 3.9 mmol/L (ref 3.5–5.1)
Sodium: 134 mmol/L — ABNORMAL LOW (ref 135–145)
Total Bilirubin: 0.7 mg/dL (ref 0.3–1.2)
Total Protein: 7.3 g/dL (ref 6.5–8.1)

## 2023-06-24 LAB — CBC WITH DIFFERENTIAL/PLATELET
Abs Immature Granulocytes: 0.06 10*3/uL (ref 0.00–0.07)
Basophils Absolute: 0.1 10*3/uL (ref 0.0–0.1)
Basophils Relative: 1 %
Eosinophils Absolute: 0.3 10*3/uL (ref 0.0–0.5)
Eosinophils Relative: 2 %
HCT: 41.9 % (ref 36.0–46.0)
Hemoglobin: 13.2 g/dL (ref 12.0–15.0)
Immature Granulocytes: 1 %
Lymphocytes Relative: 15 %
Lymphs Abs: 1.7 10*3/uL (ref 0.7–4.0)
MCH: 28.2 pg (ref 26.0–34.0)
MCHC: 31.5 g/dL (ref 30.0–36.0)
MCV: 89.5 fL (ref 80.0–100.0)
Monocytes Absolute: 0.8 10*3/uL (ref 0.1–1.0)
Monocytes Relative: 7 %
Neutro Abs: 8.7 10*3/uL — ABNORMAL HIGH (ref 1.7–7.7)
Neutrophils Relative %: 74 %
Platelets: 255 10*3/uL (ref 150–400)
RBC: 4.68 MIL/uL (ref 3.87–5.11)
RDW: 13.4 % (ref 11.5–15.5)
WBC: 11.6 10*3/uL — ABNORMAL HIGH (ref 4.0–10.5)
nRBC: 0 % (ref 0.0–0.2)

## 2023-06-24 LAB — CBC AND DIFFERENTIAL
HCT: 40 (ref 36–46)
HCT: 40 (ref 36–46)
Hemoglobin: 12.9 (ref 12.0–16.0)
Hemoglobin: 12.9 (ref 12.0–16.0)
Platelets: 284 10*3/uL (ref 150–400)
Platelets: 284 10*3/uL (ref 150–400)
WBC: 14.7
WBC: 14.7

## 2023-06-24 LAB — CBC
RBC: 4.55 (ref 3.87–5.11)
RBC: 4.55 (ref 3.87–5.11)

## 2023-06-24 LAB — URINALYSIS, ROUTINE W REFLEX MICROSCOPIC
Bilirubin Urine: NEGATIVE
Glucose, UA: NEGATIVE mg/dL
Hgb urine dipstick: NEGATIVE
Ketones, ur: NEGATIVE mg/dL
Leukocytes,Ua: NEGATIVE
Nitrite: NEGATIVE
Protein, ur: NEGATIVE mg/dL
Specific Gravity, Urine: 1.011 (ref 1.005–1.030)
pH: 6 (ref 5.0–8.0)

## 2023-06-24 LAB — POC OCCULT BLOOD, ED: Fecal Occult Bld: NEGATIVE

## 2023-06-24 LAB — LIPASE, BLOOD: Lipase: 26 U/L (ref 11–51)

## 2023-06-24 LAB — SARS CORONAVIRUS 2 BY RT PCR: SARS Coronavirus 2 by RT PCR: NEGATIVE

## 2023-06-24 MED ORDER — SUCRALFATE 1 G PO TABS
1.0000 g | ORAL_TABLET | Freq: Three times a day (TID) | ORAL | Status: DC
Start: 2023-06-24 — End: 2023-06-25

## 2023-06-24 MED ORDER — LACTATED RINGERS IV SOLN: INTRAVENOUS | Status: DC

## 2023-06-24 MED ORDER — PANTOPRAZOLE SODIUM 40 MG PO TBEC: 40.0000 mg | DELAYED_RELEASE_TABLET | Freq: Every day | ORAL | Status: DC

## 2023-06-24 MED ORDER — PANTOPRAZOLE SODIUM 40 MG PO TBEC
40.0000 mg | DELAYED_RELEASE_TABLET | Freq: Two times a day (BID) | ORAL | Status: DC
Start: 2023-06-24 — End: 2023-06-25

## 2023-06-24 NOTE — Progress Notes (Addendum)
[facility] Room Number: 140-A Place of Service:  SNF [phone]) Provider: Hazle Nordmann, NP  PCP: Mahlon Gammon, MD  Patient Care Team: Mahlon Gammon, MD as PCP - General (Internal Medicine) Jamison Neighbor, MD (Urology)  Extended Emergency Contact Information Primary Emergency Contact: Doyon,[name] Address: [address]          [place], [place] [address] [place] Home Phone: [phone] Work Phone: [phone] Relation: Son Secondary Emergency Contact: Burkey,Prachi  [place] Mobile Phone: [phone] Relation: Daughter  Code Status:  DNR  Goals of care: Advanced Directive information    06/24/2023   10:48 AM  Advanced Directives  Does Patient Have a Medical Advance Directive? Yes  Type of Advance Directive Out of facility DNR (pink MOST or yellow form);Living will  Does patient want to make changes to medical advance directive? No - Patient declined     Chief Complaint  Patient presents with   Acute Visit    Rectal bleeding.    HPI:  Pt is a 88 y.o. female seen today for an acute visit due to rectal bleeding.   She currently resides on the skilled nursing unit at [facility] due to Alzheimer's dementia. Past medical history includes: HTN (SBP > 170), orthostatic bp, PNA with hospitalization 01/2023, recurrent UTI, constipation, neurogenic bladder and spinal stenosis.   Rectal bleeding with BRB per nursing noted 08/31. She has also been observed with dark emesis/ brown fluid dripping from her mouth> 2 incidents per [facility]. Clear liquid breakfast ordered this morning. Poor historian due to advanced dementia. LBM 09/02. Hgb 13.3 06/03/2023. She is not on PPI. No h/o colon cancer or abdominal surgeries. Afebrile. Vitals stable.   Recent hospitalization 08/10-08/15. CXR showed streaky opacity to mid and lower lungs bilaterally. UA large amount of bacteria and yeast. She was given IV  Rocephin and diflucan. Her mentation improved and she was discharged back to SNF.   Son does not want to send to hospital at this time.    Past Medical History:  Diagnosis Date   Alzheimer disease (HCC)    Balance problem 07/19/2020   Constipation 07/19/2020   Dementia without behavioral disturbance (HCC) 07/19/2020   MMSE 21/30 07/21/20   Fall    Osteoarthritis    Pyelonephritis    Spinal stenosis 07/19/2020   Weakness of left lower extremity 07/19/2020   History reviewed. No pertinent surgical history.  Allergies  Allergen Reactions   Sulfa Antibiotics Rash    Rash to trunk, legs, neck, scalp, and arms    Allergies as of 06/24/2023       Reactions   Sulfa Antibiotics Rash   Rash to trunk, legs, neck, scalp, and arms        Medication List        Accurate as of June 24, 2023 10:48 AM. If you have any questions, ask your nurse or doctor.          STOP taking these medications    estradiol 7.5 MCG/24HR vaginal ring Commonly known as: ESTRING Stopped by: Octavia Heir       TAKE these medications    amLODipine 5 MG tablet Commonly known as: NORVASC Take 1 tablet (5 mg total) by mouth daily.   hydrALAZINE 10 MG tablet Commonly known as: APRESOLINE Take 10 mg by mouth every 8 (eight) hours as needed (SBP > 180).   losartan 25 MG tablet Commonly known  as: COZAAR Take 25 mg by mouth daily.   nitrofurantoin 50 MG capsule Commonly known as: MACRODANTIN Take 50 mg by mouth 2 (two) times daily.   polyethylene glycol 17 g packet Commonly known as: MIRALAX / GLYCOLAX Take 17 g by mouth every other day.   senna 8.6 MG Tabs tablet Commonly known as: SENOKOT Take 17.2 mg by mouth at bedtime.   THERACRAN PO Take 250 mg by mouth 2 (two) times daily.   Tylenol Dissolve Packs 500 MG Pack Generic drug: Acetaminophen Take 2 packets by mouth in the morning.        Review of Systems  Unable to perform ROS: Dementia    Immunization History   Administered Date(s) Administered   Influenza, High Dose Seasonal PF 07/25/2021   Influenza-Unspecified 08/05/2018, 09/29/2018, 07/02/2019, 08/21/2019, 08/11/2020, 07/26/2022   Moderna Covid-19 Vaccine Bivalent Booster 76yrs & up 08/01/2021, 08/26/2022, 02/13/2023   Moderna SARS-COV2 Booster Vaccination 08/31/2020, 06/12/2021   Moderna Sars-Covid-2 Vaccination 11/04/2019, 12/02/2019   Pneumococcal Conjugate-13 09/28/2018   Pneumococcal Polysaccharide-23 10/21/2017   Tdap 02/23/2021   Zoster Recombinant(Shingrix) 05/24/2021, 07/24/2021   Pertinent  Health Maintenance Due  Topic Date Due   INFLUENZA VACCINE  05/22/2023   DEXA SCAN  Discontinued      06/03/2022    3:33 PM 10/01/2022    1:09 PM 11/05/2022   10:43 AM 12/05/2022    1:51 PM 02/17/2023    8:50 AM  Fall Risk  Falls in the past year?  0 0 0 0  Was there an injury with Fall?  0 0 0 0  Fall Risk Category Calculator  0 0 0 0  Fall Risk Category (Retired)  Low     (RETIRED) Patient Fall Risk Level High fall risk High fall risk     Patient at Risk for Falls Due to  History of fall(s) History of fall(s) History of fall(s) No Fall Risks  Fall risk Follow up  Falls evaluation completed Falls evaluation completed Falls evaluation completed Falls evaluation completed   Functional Status Survey:    Vitals:   06/24/23 1033  BP: 116/72  Pulse: 76  Resp: 20  Temp: 98.1 F (36.7 C)  SpO2: 90%  Weight: 157 lb (71.2 kg)  Height: 5\' 2"  (1.575 m)   Body mass index is 28.72 kg/m. Physical Exam Vitals reviewed.  Constitutional:      General: She is not in acute distress. HENT:     Head: Normocephalic.  Eyes:     General:        Right eye: No discharge.        Left eye: No discharge.  Cardiovascular:     Rate and Rhythm: Normal rate and regular rhythm.     Pulses: Normal pulses.     Heart sounds: Normal heart sounds.  Pulmonary:     Effort: Pulmonary effort is normal. No respiratory distress.     Breath sounds: Normal  breath sounds. No wheezing.  Abdominal:     General: Bowel sounds are normal. There is no distension.     Palpations: Abdomen is soft. There is no mass.     Tenderness: There is no abdominal tenderness. There is no guarding or rebound.     Hernia: No hernia is present.  Musculoskeletal:     Cervical back: Neck supple.     Right lower leg: No edema.     Left lower leg: No edema.  Skin:    General: Skin is warm.  Capillary Refill: Capillary refill takes less than 2 seconds.  Neurological:     General: No focal deficit present.     Mental Status: She is easily aroused.     Motor: Weakness present.     Gait: Gait abnormal.  Psychiatric:        Mood and Affect: Mood normal.     Labs reviewed: Recent Labs    02/15/23 1448 02/18/23 0000 05/31/23 1506 06/01/23 0219 06/03/23 0212 06/10/23 0000  NA 136   < > 137 139  --  139  K 3.8   < > 4.0 5.0  --  4.2  CL 100   < > 100 104  --  102  CO2 25   < > 28 25  --  27*  GLUCOSE 101*  --  95 91  --   --   BUN 16   < > 17 13  --  11  CREATININE 1.07*   < > 0.72 0.62  --  0.5  CALCIUM 8.8*   < > 9.4 9.0  --  9.1  MG  --   --  2.1  --  1.9  --   PHOS  --   --   --   --  3.4  --    < > = values in this interval not displayed.   Recent Labs    02/04/23 2205 02/15/23 1448 02/18/23 0000  AST 24 27 28   ALT 19 25 25   ALKPHOS 82 79 88  BILITOT 0.9 0.5  --   PROT 6.0* 7.4  --   ALBUMIN 2.7* 3.5 3.8   Recent Labs    02/04/23 2205 02/07/23 0504 02/15/23 1448 05/31/23 1506 06/01/23 0219 06/03/23 0212  WBC 6.5   < > 5.7 7.3 8.4 7.3  NEUTROABS 4.0  --  3.3  --   --  4.7  HGB 12.8   < > 14.2 14.3 12.7 13.3  HCT 40.1   < > 46.1* 46.0 40.0 41.6  MCV 92.0   < > 93.3 88.6 91.5 86.0  PLT 166   < > 251 211 197 195   < > = values in this interval not displayed.   Lab Results  Component Value Date   TSH 0.942 04/25/2022   No results found for: "HGBA1C" Lab Results  Component Value Date   CHOL 235 (A) 07/20/2020   HDL 100 (A)  07/20/2020   LDLCALC 134 07/20/2020   TRIG 102 07/20/2020    Significant Diagnostic Results in last 30 days:  CT HEAD WO CONTRAST ( )  Result Date: 05/31/2023 CLINICAL DATA:  Mental status change, unknown cause EXAM: CT HEAD WITHOUT CONTRAST TECHNIQUE: Contiguous axial images were obtained from the base of the skull through the vertex without intravenous contrast. RADIATION DOSE REDUCTION: This exam was performed according to the departmental dose-optimization program which includes automated exposure control, adjustment of the mA and/or kV according to patient size and/or use of iterative reconstruction technique. COMPARISON:  CT head 06/03/2022 FINDINGS: Brain: Patchy and confluent areas of decreased attenuation are noted throughout the deep and periventricular white matter of the cerebral hemispheres bilaterally, compatible with chronic microvascular ischemic disease. No evidence of large-territorial acute infarction. No parenchymal hemorrhage. No mass lesion. No extra-axial collection. No mass effect or midline shift. No hydrocephalus. Basilar cisterns are patent. Vascular: No hyperdense vessel. Atherosclerotic calcifications are present within the cavernous internal carotid, and vertebral arteries. Skull: No acute fracture or focal lesion. Sinuses/Orbits: Paranasal sinuses and mastoid  air cells are clear. Bilateral lens replacement. The orbits are unremarkable. Other: None. IMPRESSION: No acute intracranial abnormality. Electronically Signed   By: Tish Frederickson M.D.   On: 05/31/2023 19:11   DG Chest Portable 1 View  Result Date: 05/31/2023 CLINICAL DATA:  Weakness EXAM: PORTABLE CHEST 1 VIEW COMPARISON:  02/15/2023 chest radiograph. FINDINGS: Stable cardiomediastinal silhouette with normal heart size. No pneumothorax. No pleural effusion. Streaky hazy opacity in the mid and lower lungs bilaterally. IMPRESSION: Streaky hazy opacity in the mid and lower lungs bilaterally, cannot exclude aspiration  or pneumonia. Follow-up chest radiographs advised. Electronically Signed   By: Delbert Phenix M.D.   On: 05/31/2023 15:16    Assessment/Plan: 1. Rectal bleeding - noted 08/31 - 2 incidents of dark emesis/ dark brown fluid from mouth - suspect GI bleed - hgb 13.3 06/03/2023 - LBM 09/02 - exam unremarkable, vital stable - stat cbc/diff, h.pylori (family request) KUB> WBC 14.7, hgb 12.9> discussed results with son> agrees to send to ED for evaluation - start Protonix 40 mg po daily and carafate - repeat cbc/diff, bmp 09/05 - cont clear liquid diet - son does not want hospital evaluation at this time  2. Dysphagia, unspecified type - ongoing - aspiration pneumonia 01/2023 - followed by ST - cont DYS3 diet  3. Dementia without behavioral disturbance (HCC) - no behaviors  - sleeping more - dependent with ADLs - cont skilled nursing    Family/ staff Communication: plan discussed with patient and nurse  Labs/tests ordered:  stat cbc/diff, KUB, h.pylori

## 2023-06-24 NOTE — Addendum Note (Signed)
Addended byHazle Nordmann E on: 06/24/2023 12:49 PM   Modules accepted: Orders

## 2023-06-24 NOTE — ED Triage Notes (Signed)
EMS reports from [facility], staff called out states Pt altered from baseline and weakness since this morning. Hx of dementia. Pt slow speech is at baseline per staff. Pt offers no complaints.  BP 110/56 HR 87 RR 18 Sp02 98 RA CBG 139

## 2023-06-24 NOTE — ED Provider Notes (Signed)
[place] EMERGENCY DEPARTMENT AT [facility] Provider Note   CSN: [id number] Arrival date & time: 06/24/23  1655     History  Chief Complaint  Patient presents with   Altered Mental Status   Weakness    Cheryl Monroe is a 88 y.o. female.  88 year old female who presented from nursing home due to altered mental status.  Patient has a history dementia.  According to EMS, patient is not acting herself.  EMS called and patient's blood sugar was 139.  Otherwise normal vital signs.  Transport here for further evaluation       Home Medications Prior to Admission medications   Medication Sig Start Date End Date Taking? Authorizing Provider  Acetaminophen (TYLENOL DISSOLVE PACKS) 500 MG PACK Take 2 packets by mouth in the morning.    [provider]  amLODipine (NORVASC) 5 MG tablet Take 1 tablet (5 mg total) by mouth daily. 06/05/23 06/04/24  Meredeth Ide, MD  Cranberry (THERACRAN PO) Take 250 mg by mouth 2 (two) times daily.    [provider]  hydrALAZINE (APRESOLINE) 10 MG tablet Take 10 mg by mouth every 8 (eight) hours as needed (SBP > 180).    [provider]  losartan (COZAAR) 25 MG tablet Take 25 mg by mouth daily.    [provider]  nitrofurantoin (MACRODANTIN) 50 MG capsule Take 50 mg by mouth 2 (two) times daily.    [provider]  pantoprazole (PROTONIX) 40 MG tablet Take 1 tablet (40 mg total) by mouth 2 (two) times daily. 06/24/23   Fargo, Amy E, NP  polyethylene glycol (MIRALAX / GLYCOLAX) 17 g packet Take 17 g by mouth every other day.    [provider]  senna (SENOKOT) 8.6 MG TABS tablet Take 17.2 mg by mouth at bedtime.    [provider]  sucralfate (CARAFATE) 1 g tablet Take 1 tablet (1 g total) by mouth 4 (four) times daily -  with meals and at bedtime for 10 days. 06/24/23 07/04/23  Octavia Heir, NP      Allergies    Sulfa antibiotics    Review of Systems   Review of Systems  Unable to  perform ROS: Dementia    Physical Exam Updated Vital Signs BP (!) 142/61   Pulse 67   Temp 97.9 F (36.6 C) (Oral)   Resp 18   SpO2 96%  Physical Exam Vitals and nursing note reviewed.  Constitutional:      General: She is not in acute distress.    Appearance: Normal appearance. She is well-developed. She is not toxic-appearing.  HENT:     Head: Normocephalic and atraumatic.  Eyes:     General: Lids are normal.     Conjunctiva/sclera: Conjunctivae normal.     Pupils: Pupils are equal, round, and reactive to light.  Neck:     Thyroid: No thyroid mass.     Trachea: No tracheal deviation.  Cardiovascular:     Rate and Rhythm: Normal rate and regular rhythm.     Heart sounds: Normal heart sounds. No murmur heard.    No gallop.  Pulmonary:     Effort: Pulmonary effort is normal. No respiratory distress.     Breath sounds: No stridor. Examination of the right-upper field reveals decreased breath sounds. Examination of the left-upper field reveals decreased breath sounds. Decreased breath sounds present. No rhonchi or rales.  Abdominal:     General: There is no distension.  Palpations: Abdomen is soft.     Tenderness: There is no abdominal tenderness. There is no rebound.  Musculoskeletal:        General: No tenderness. Normal range of motion.     Cervical back: Normal range of motion and neck supple.  Skin:    General: Skin is warm and dry.     Findings: No abrasion or rash.  Neurological:     Mental Status: She is alert. She is disoriented and confused.     GCS: GCS eye subscore is 4. GCS verbal subscore is 5. GCS motor subscore is 6.     Cranial Nerves: Cranial nerves are intact and 2-12 are intact. No cranial nerve deficit.     Sensory: No sensory deficit.     Motor: Motor function is intact.     Comments: Strength 5 of 5 in upper as well as lower extremities  Psychiatric:        Attention and Perception: She is inattentive.        Speech: Speech is delayed.         Behavior: Behavior is slowed.     ED Results / Procedures / Treatments   Labs (all labs ordered are listed, but only abnormal results are displayed) Labs Reviewed  CBC WITH DIFFERENTIAL/PLATELET  URINALYSIS, ROUTINE W REFLEX MICROSCOPIC  COMPREHENSIVE METABOLIC PANEL    EKG None  Radiology No results found.  Procedures Procedures    Medications Ordered in ED Medications  lactated ringers infusion (has no administration in time range)    ED Course/ Medical Decision Making/ A&P                                 Medical Decision Making Amount and/or Complexity of Data Reviewed Labs: ordered. Radiology: ordered. ECG/medicine tests: ordered.  Risk Prescription drug management.   Patient's workup here is reassuring.  I did have a long discussion with patient's son who is a physician about patient's current symptoms.  He states that there was some concern for possible GI bleeding.  Her stool was guaiac negative here.  Hemoglobin is stable.  Her abdominal exam is benign.  Her chest x-ray did not show any evidence of free air.  Low suspicion for acute abdominal process at this time.  Urinalysis is negative.  COVID is negative as well 2.  Plan will be to send back to her facility.  Son is in agreement        Final Clinical Impression(s) / ED Diagnoses Final diagnoses:  None    Rx / DC Orders ED Discharge Orders     None         Lorre Nick, MD 06/24/23 2112

## 2023-06-24 NOTE — Addendum Note (Signed)
Addended byHazle Nordmann E on: 06/24/2023 02:59 PM   Modules accepted: Orders

## 2023-06-24 NOTE — ED Notes (Signed)
[facility] called and scheduled for transport back to [facility]. Pt's son at bedside and verbalized patient cannot normally walk.

## 2023-06-25 ENCOUNTER — Encounter: Payer: Self-pay | Admitting: Orthopedic Surgery

## 2023-06-25 ENCOUNTER — Non-Acute Institutional Stay (SKILLED_NURSING_FACILITY): Payer: Medicare Other | Admitting: Orthopedic Surgery

## 2023-06-25 DIAGNOSIS — R5383 Other fatigue: Secondary | ICD-10-CM

## 2023-06-25 DIAGNOSIS — F0390 Unspecified dementia without behavioral disturbance: Secondary | ICD-10-CM

## 2023-06-25 DIAGNOSIS — R198 Other specified symptoms and signs involving the digestive system and abdomen: Secondary | ICD-10-CM | POA: Diagnosis not present

## 2023-06-25 DIAGNOSIS — K625 Hemorrhage of anus and rectum: Secondary | ICD-10-CM | POA: Diagnosis not present

## 2023-06-25 MED ORDER — SUCRALFATE 1 G PO TABS
1.0000 g | ORAL_TABLET | Freq: Three times a day (TID) | ORAL | Status: DC
Start: 2023-06-25 — End: 2023-07-07

## 2023-06-25 MED ORDER — PANTOPRAZOLE SODIUM 20 MG PO TBEC: 20.0000 mg | DELAYED_RELEASE_TABLET | Freq: Two times a day (BID) | ORAL | Status: DC

## 2023-06-25 NOTE — Progress Notes (Signed)
Location[facility] Room Number: 140-A Place of Service:  SNF (31) Provider: Hazle Nordmann, NP  Code Status: DNR Goals of Care:     06/25/2023    2:48 PM  Advanced Directives  Does Patient Have a Medical Advance Directive? Yes  Type of Advance Directive Living will;Out of facility DNR (pink MOST or yellow form)  Does patient want to make changes to medical advance directive? No - Patient declined     Chief Complaint  Patient presents with   Hospitalization Follow-up    HPI: Patient is a 88 y.o. female seen today f/u s/p ED visit 06/24/2023.   She currently resides on the skilled nursing unit at [facility] due to Alzheimer's dementia. Past medical history includes: HTN (SBP > 170), orthostatic bp, PNA with hospitalization 01/2023, recurrent UTI, constipation, neurogenic bladder and spinal stenosis.   "Rectal bleeding with BRB per nursing noted 08/31. She has also been observed with dark emesis/ brown fluid dripping from her mouth> 2 incidents per [facility]. Clear liquid breakfast ordered this morning. Poor historian due to advanced dementia. LBM 09/02. Hgb 13.3 06/03/2023. She is not on PPI. No h/o colon cancer or abdominal surgeries. Afebrile. Vitals stable." Stat cbc/diff revealed WBC 14.7 and hgb 12.9. Nursing reported increased lethargy in late afternoon and she was sent to ED for evaluation. ED lab work: WBC 11.6, hgb 13.2, Na+ 134, K+3.9, BUN/creat 17/0.59, glucose 105,AST/ALT 25/41, UA unremarkable, covid negative, hemoccult negative. CXR with no active disease. She was discharged back to [facility] after discharge.   Today, she appears at baseline. She ate at least 50% of both meals. No episodes of N/V or bloody stools. Unable to express needs due to aphasia. She does smile when I say her son's name. Nursing collected stool sample for h. Pylori test> lab will pick up tomorrow. Afebrile. Vitals stable.     Past Medical History:  Diagnosis Date    Alzheimer disease (HCC)    Balance problem 07/19/2020   Constipation 07/19/2020   Dementia without behavioral disturbance (HCC) 07/19/2020   MMSE 21/30 07/21/20   Fall    Osteoarthritis    Pyelonephritis    Spinal stenosis 07/19/2020   Weakness of left lower extremity 07/19/2020    History reviewed. No pertinent surgical history.  Allergies  Allergen Reactions   Sulfa Antibiotics Rash    Rash to trunk, legs, neck, scalp, and arms    Outpatient Encounter Medications as of 06/25/2023  Medication Sig   Acetaminophen (TYLENOL DISSOLVE PACKS) 500 MG PACK Take 2 packets by mouth in the morning.   amLODipine (NORVASC) 5 MG tablet Take 1 tablet (5 mg total) by mouth daily.   Cranberry (THERACRAN PO) Take 250 mg by mouth 2 (two) times daily.   hydrALAZINE (APRESOLINE) 10 MG tablet Take 10 mg by mouth every 8 (eight) hours as needed (SBP > 180).   losartan (COZAAR) 25 MG tablet Take 25 mg by mouth daily.   nitrofurantoin (MACRODANTIN) 50 MG capsule Take 50 mg by mouth 2 (two) times daily.   polyethylene glycol (MIRALAX / GLYCOLAX) 17 g packet Take 17 g by mouth every other day.   senna (SENOKOT) 8.6 MG TABS tablet Take 17.2 mg by mouth at bedtime.   pantoprazole (PROTONIX) 40 MG tablet Take 1 tablet (40 mg total) by mouth 2 (two) times daily.   [DISCONTINUED] sucralfate (CARAFATE) 1 g tablet Take 1 tablet (1 g total) by mouth 4 (four) times daily -  with meals and  at bedtime for 10 days.   No facility-administered encounter medications on file as of 06/25/2023.    Review of Systems:  Review of Systems  Unable to perform ROS: Dementia    Health Maintenance  Topic Date Due   INFLUENZA VACCINE  05/22/2023   COVID-19 Vaccine (8 - 2023-24 season) 06/22/2023   Medicare Annual Wellness (AWV)  11/23/2023   DTaP/Tdap/Td (2 - Td or Tdap) 02/24/2031   Pneumonia Vaccine 74+ Years old  Completed   Zoster Vaccines- Shingrix  Completed   HPV VACCINES  Aged Out   DEXA SCAN  Discontinued    Physical  Exam: Vitals:   06/25/23 1443  BP: 114/76  Pulse: 71  Resp: 17  Temp: (!) 97.2 F (36.2 C)  SpO2: 92%  Weight: 157 lb (71.2 kg)  Height: 5\' 2"  (1.575 m)   Body mass index is 28.72 kg/m. Physical Exam Vitals reviewed.  Constitutional:      General: She is not in acute distress. HENT:     Head: Normocephalic.  Eyes:     General:        Right eye: No discharge.        Left eye: No discharge.  Cardiovascular:     Rate and Rhythm: Normal rate and regular rhythm.     Pulses: Normal pulses.     Heart sounds: Normal heart sounds.  Pulmonary:     Effort: Pulmonary effort is normal. No respiratory distress.     Breath sounds: Normal breath sounds. No wheezing.  Abdominal:     General: Bowel sounds are normal. There is no distension.     Palpations: Abdomen is soft.     Tenderness: There is no abdominal tenderness.  Musculoskeletal:     Cervical back: Neck supple.     Right lower leg: No edema.     Left lower leg: No edema.  Skin:    General: Skin is warm.     Capillary Refill: Capillary refill takes less than 2 seconds.  Neurological:     General: No focal deficit present.     Mental Status: She is alert. Mental status is at baseline.     Motor: Weakness present.     Gait: Gait abnormal.     Comments: wheelchair  Psychiatric:        Mood and Affect: Mood normal.     Labs reviewed: Basic Metabolic Panel: Recent Labs    05/31/23 1506 06/01/23 0219 06/03/23 0212 06/10/23 0000 06/24/23 1714  NA 137 139  --  139 134*  K 4.0 5.0  --  4.2 3.9  CL 100 104  --  102 98  CO2 28 25  --  27* 28  GLUCOSE 95 91  --   --  105*  BUN 17 13  --  11 17  CREATININE 0.72 0.62  --  0.5 0.59  CALCIUM 9.4 9.0  --  9.1 8.9  MG 2.1  --  1.9  --   --   PHOS  --   --  3.4  --   --    Liver Function Tests: Recent Labs    02/04/23 2205 02/15/23 1448 02/18/23 0000 06/24/23 1714  AST 24 27 28 25   ALT 19 25 25  41  ALKPHOS 82 79 88 93  BILITOT 0.9 0.5  --  0.7  PROT 6.0* 7.4   --  7.3  ALBUMIN 2.7* 3.5 3.8 3.3*   Recent Labs    06/24/23 1714  LIPASE 26  Recent Labs    02/15/23 1448  AMMONIA 12   CBC: Recent Labs    02/15/23 1448 05/31/23 1506 06/01/23 0219 06/03/23 0212 06/24/23 0000 06/24/23 1714  WBC 5.7   < > 8.4 7.3 14.7  14.7 11.6*  NEUTROABS 3.3  --   --  4.7  --  8.7*  HGB 14.2   < > 12.7 13.3 12.9  12.9 13.2  HCT 46.1*   < > 40.0 41.6 40  40 41.9  MCV 93.3   < > 91.5 86.0  --  89.5  PLT 251   < > 197 195 284  284 255   < > = values in this interval not displayed.   Lipid Panel: No results for input(s): "CHOL", "HDL", "LDLCALC", "TRIG", "CHOLHDL", "LDLDIRECT" in the last 8760 hours. No results found for: "HGBA1C"  Procedures since last visit: DG Chest Port 1 View  Result Date: 06/24/2023 CLINICAL DATA:  Shortness of breath and weakness since this morning. EXAM: PORTABLE CHEST 1 VIEW COMPARISON:  05/31/2023 FINDINGS: The heart size and mediastinal contours are within normal limits. Both lungs are clear. The visualized skeletal structures are unremarkable. IMPRESSION: No active disease. Electronically Signed   By: Burman Nieves M.D.   On: 06/24/2023 19:40    Assessment/Plan 1. Lethargy - 09/03 ED evaluation> unremarkable - baseline today  2. Rectal bleeding - 08/31 BRB per rectum  - hgb stable 13.3 in ED  - hemoccult negative in ED  3. Peptic ulcer symptoms - 2 incidents of dark emesis/ dark brown fluid from mouth - suspect GI bleed or PUD - hgb stable - h.pylori test pending - pantoprazole (PROTONIX) 20 MG tablet; Take 1 tablet (20 mg total) by mouth 2 (two) times daily. - sucralfate (CARAFATE) 1 g tablet; Take 1 tablet (1 g total) by mouth 4 (four) times daily -  with meals and at bedtime for 10 days.  4. Dementia without behavioral disturbance (HCC) - no behaviors - dependent with ADLs - sleeping more - ambulates with w/c - cont skilled nursing     Labs/tests ordered:  h.pylori test pending Next appt:   Visit date not found

## 2023-06-25 NOTE — ED Notes (Signed)
Attempted to call [facility] with no answer. PTAR given report at bedside. Patient's son Elby Showers notified that patient would be sent back to [facility] via ambulance.

## 2023-06-30 ENCOUNTER — Non-Acute Institutional Stay (SKILLED_NURSING_FACILITY): Payer: Self-pay | Admitting: Internal Medicine

## 2023-06-30 DIAGNOSIS — F0280 Dementia in other diseases classified elsewhere without behavioral disturbance: Secondary | ICD-10-CM

## 2023-06-30 DIAGNOSIS — N319 Neuromuscular dysfunction of bladder, unspecified: Secondary | ICD-10-CM

## 2023-06-30 DIAGNOSIS — I10 Essential (primary) hypertension: Secondary | ICD-10-CM | POA: Diagnosis not present

## 2023-06-30 DIAGNOSIS — K922 Gastrointestinal hemorrhage, unspecified: Secondary | ICD-10-CM | POA: Diagnosis not present

## 2023-06-30 DIAGNOSIS — R1310 Dysphagia, unspecified: Secondary | ICD-10-CM

## 2023-06-30 DIAGNOSIS — R2681 Unsteadiness on feet: Secondary | ICD-10-CM

## 2023-06-30 DIAGNOSIS — N390 Urinary tract infection, site not specified: Secondary | ICD-10-CM

## 2023-06-30 DIAGNOSIS — G301 Alzheimer's disease with late onset: Secondary | ICD-10-CM

## 2023-07-07 ENCOUNTER — Encounter: Payer: Self-pay | Admitting: Internal Medicine

## 2023-07-07 NOTE — Progress Notes (Unsigned)
Location:  Medical illustrator of Service:  SNF (31)  Provider:   Code Status: DNR Goals of Care:     06/25/2023    2:48 PM  Advanced Directives  Does Patient Have a Medical Advance Directive? Yes  Type of Advance Directive Living will;Out of facility DNR (pink MOST or yellow form)  Does patient want to make changes to medical advance directive? No - Patient declined     Chief Complaint  Patient presents with   Care Management    HPI: Patient is a 88 y.o. female seen today for medical management of chronic diseases.    Lives in SNF in [place]   Patient has a history of Alzheimer's dementia with Aphasia, hypertension, orthostatic hypotension She also has history of recurrent UTIs and urinary incontinence  Also h/o Orthostatic BP with Wide variation SBP Can vary from 170-90  Few days ago Patient was seen Brownish color stuff coming from her Mouth ? Blood She was then send to ED for Lethargy Work up including Chest Xray and CBC were all stable Patient was send back to SNF She I snow on Protonix and Carafate No More ? Bleeding noticed  She is at her baseline now No other issues per nurses Respond with smile Mostly stays in her Wheelchair   Weight is stable  Past Medical History:  Diagnosis Date   Alzheimer disease (HCC)    Balance problem 07/19/2020   Constipation 07/19/2020   Dementia without behavioral disturbance (HCC) 07/19/2020   MMSE 21/30 07/21/20   Fall    Osteoarthritis    Pyelonephritis    Spinal stenosis 07/19/2020   Weakness of left lower extremity 07/19/2020    No past surgical history on file.  Allergies  Allergen Reactions   Sulfa Antibiotics Rash    Rash to trunk, legs, neck, scalp, and arms    Outpatient Encounter Medications as of 06/30/2023  Medication Sig   Acetaminophen (TYLENOL DISSOLVE PACKS) 500 MG PACK Take 2 packets by mouth in the morning.   amLODipine (NORVASC) 5 MG tablet Take 1 tablet (5 mg total) by mouth daily.    Cranberry (THERACRAN PO) Take 250 mg by mouth 2 (two) times daily.   hydrALAZINE (APRESOLINE) 10 MG tablet Take 10 mg by mouth every 8 (eight) hours as needed (SBP > 180).   losartan (COZAAR) 25 MG tablet Take 25 mg by mouth daily.   nitrofurantoin (MACRODANTIN) 50 MG capsule Take 50 mg by mouth 2 (two) times daily.   pantoprazole (PROTONIX) 20 MG tablet Take 1 tablet (20 mg total) by mouth 2 (two) times daily.   polyethylene glycol (MIRALAX / GLYCOLAX) 17 g packet Take 17 g by mouth every other day.   senna (SENOKOT) 8.6 MG TABS tablet Take 17.2 mg by mouth at bedtime.   sucralfate (CARAFATE) 1 g tablet Take 1 tablet (1 g total) by mouth 4 (four) times daily -  with meals and at bedtime for 10 days.   No facility-administered encounter medications on file as of 06/30/2023.    Review of Systems:  Review of Systems  Unable to perform ROS: Dementia    Health Maintenance  Topic Date Due   INFLUENZA VACCINE  05/22/2023   COVID-19 Vaccine (8 - 2023-24 season) 06/22/2023   Medicare Annual Wellness (AWV)  11/23/2023   DTaP/Tdap/Td (2 - Td or Tdap) 02/24/2031   Pneumonia Vaccine 25+ Years old  Completed   Zoster Vaccines- Shingrix  Completed   HPV VACCINES  Aged Out   DEXA SCAN  Discontinued    Physical Exam: Vitals:   06/30/23 2203  BP: (!) 140/98  Pulse: 79  Resp: 17  Temp: (!) 97.2 F (36.2 C)  Weight: 157 lb (71.2 kg)   Body mass index is 28.72 kg/m. Physical Exam Vitals reviewed.  Constitutional:      Appearance: Normal appearance.  HENT:     Head: Normocephalic.     Nose: Nose normal.     Mouth/Throat:     Mouth: Mucous membranes are moist.     Pharynx: Oropharynx is clear.  Eyes:     Pupils: Pupils are equal, round, and reactive to light.  Cardiovascular:     Rate and Rhythm: Normal rate and regular rhythm.     Pulses: Normal pulses.     Heart sounds: Normal heart sounds. No murmur heard. Pulmonary:     Effort: Pulmonary effort is normal.     Breath sounds:  Normal breath sounds.  Abdominal:     General: Abdomen is flat. Bowel sounds are normal.     Palpations: Abdomen is soft.  Musculoskeletal:        General: Swelling present.     Cervical back: Neck supple.     Comments: Mild Edema Bilateral  Skin:    General: Skin is warm.  Neurological:     General: No focal deficit present.     Mental Status: She is alert.  Psychiatric:        Mood and Affect: Mood normal.        Thought Content: Thought content normal.     Labs reviewed: Basic Metabolic Panel: Recent Labs    05/31/23 1506 06/01/23 0219 06/03/23 0212 06/10/23 0000 06/24/23 1714  NA 137 139  --  139 134*  K 4.0 5.0  --  4.2 3.9  CL 100 104  --  102 98  CO2 28 25  --  27* 28  GLUCOSE 95 91  --   --  105*  BUN 17 13  --  11 17  CREATININE 0.72 0.62  --  0.5 0.59  CALCIUM 9.4 9.0  --  9.1 8.9  MG 2.1  --  1.9  --   --   PHOS  --   --  3.4  --   --    Liver Function Tests: Recent Labs    02/04/23 2205 02/15/23 1448 02/18/23 0000 06/24/23 1714  AST 24 27 28 25   ALT 19 25 25  41  ALKPHOS 82 79 88 93  BILITOT 0.9 0.5  --  0.7  PROT 6.0* 7.4  --  7.3  ALBUMIN 2.7* 3.5 3.8 3.3*   Recent Labs    06/24/23 1714  LIPASE 26   Recent Labs    02/15/23 1448  AMMONIA 12   CBC: Recent Labs    02/15/23 1448 05/31/23 1506 06/01/23 0219 06/03/23 0212 06/24/23 0000 06/24/23 1714  WBC 5.7   < > 8.4 7.3 14.7  14.7 11.6*  NEUTROABS 3.3  --   --  4.7  --  8.7*  HGB 14.2   < > 12.7 13.3 12.9  12.9 13.2  HCT 46.1*   < > 40.0 41.6 40  40 41.9  MCV 93.3   < > 91.5 86.0  --  89.5  PLT 251   < > 197 195 284  284 255   < > = values in this interval not displayed.   Lipid Panel: No results for input(s): "CHOL", "  HDL", "LDLCALC", "TRIG", "CHOLHDL", "LDLDIRECT" in the last 8760 hours. No results found for: "HGBA1C"  Procedures since last visit: DG Chest Port 1 View  Result Date: 06/24/2023 CLINICAL DATA:  Shortness of breath and weakness since this morning. EXAM:  PORTABLE CHEST 1 VIEW COMPARISON:  05/31/2023 FINDINGS: The heart size and mediastinal contours are within normal limits. Both lungs are clear. The visualized skeletal structures are unremarkable. IMPRESSION: No active disease. Electronically Signed   By: Burman Nieves M.D.   On: 06/24/2023 19:40    Assessment/Plan 1. Upper GI bleed Now on Protonix and Sucrafate Hgb Stable No more episodes  2. Dysphagia, unspecified type On D3 diet High Risk for Aspiration due to her Cognition  3. Essential hypertension Stable on Cozaar and Norvasc Has some edema now in her Legs will watch for now  4. Neurogenic bladder On   5. Recurrent UTI Macrodantin,Cranberry, Estrogen   6. Unstable gait Worked with therapy Her issue is Cognition and Inability to follow complex Commands Also with Wide fluctuations in her BP she usually stays in chair.  7. Late onset Alzheimer's dementia without behavioral disturbance (HCC) Wheelchair dependent and ADLS dependent MRI showed 84/6/96 revealed no evidence of acute intracranial abnormality, Moderate chronic small vessel ischemic changes within the cerebral white matter. Aphasia Worse Continue SNF support    Labs/tests ordered:  * No order type specified * Next appt:  Visit date not found

## 2023-07-31 ENCOUNTER — Non-Acute Institutional Stay (SKILLED_NURSING_FACILITY): Payer: Medicare Other | Admitting: Adult Health

## 2023-07-31 ENCOUNTER — Encounter: Payer: Self-pay | Admitting: Adult Health

## 2023-07-31 DIAGNOSIS — G301 Alzheimer's disease with late onset: Secondary | ICD-10-CM | POA: Diagnosis not present

## 2023-07-31 DIAGNOSIS — F0280 Dementia in other diseases classified elsewhere without behavioral disturbance: Secondary | ICD-10-CM

## 2023-07-31 DIAGNOSIS — R601 Generalized edema: Secondary | ICD-10-CM

## 2023-07-31 DIAGNOSIS — R198 Other specified symptoms and signs involving the digestive system and abdomen: Secondary | ICD-10-CM | POA: Diagnosis not present

## 2023-07-31 DIAGNOSIS — I10 Essential (primary) hypertension: Secondary | ICD-10-CM

## 2023-07-31 DIAGNOSIS — K5901 Slow transit constipation: Secondary | ICD-10-CM

## 2023-07-31 DIAGNOSIS — N390 Urinary tract infection, site not specified: Secondary | ICD-10-CM

## 2023-08-01 MED ORDER — SUCRALFATE 1 GM/10ML PO SUSP: 1.0000 g | Freq: Three times a day (TID) | ORAL | Status: DC

## 2023-08-01 NOTE — Progress Notes (Addendum)
Location:  Medical illustrator of Service:  SNF (31) Provider:   Peggye Ley, ANP [facility] [phone]   Mahlon Gammon, MD  Patient Care Team: Mahlon Gammon, MD as PCP - General (Internal Medicine) Jamison Neighbor, MD (Urology)  Extended Emergency Contact Information Primary Emergency Contact: Bodine,[name] Address: [address]          [place], [place] [address] [place] Home Phone: [phone] Work Phone: [phone] Relation: Son Secondary Emergency Contact: Abel,Prachi  [place] Mobile Phone: [phone] Relation: Daughter  Code Status:  DNR Goals of care: Advanced Directive information    06/25/2023    2:48 PM  Advanced Directives  Does Patient Have a Medical Advance Directive? Yes  Type of Advance Directive Living will;Out of facility DNR (pink MOST or yellow form)  Does patient want to make changes to medical advance directive? No - Patient declined     Chief Complaint  Patient presents with   Medical Management of Chronic Issues    HPI:  Pt is a 88 y.o. female seen today for medical management of chronic diseases.    PMH significant for dementia, HTN, orthostatic hypotension. Aphasia, spinal stenosis, recurrent UTI, neurogenic bladder, dysphagia, and constipation.  She was hospitalized 05/31/23-06/05/23  CXR 05/31/23 showed streaky hazy opacity in the mid and lower lungs bilaterally.  UA reveal large amt of bacteria and WBCs and grew yeast.  She was treated with IV Rocephin for pna and diflucan for UTI Speech therapy evaluated her indicated that she needed a D3 diet and asp prec.  Swallow study in 02/06/23 showed presbyphagia, no overt aspiration.  CT of the head showed no acute abnormality. Mental status improved during her stay  Sent to the ED weakness and concern for GI bleeding. She was hemoccult negative in the hospital and HGb was stable so she was released. Seen  by NP Amy 06/25/23 placed on carafate for two episodes of emesis as well as protonix. H pylori test ordered and is negative.   No further GIB issues reported. Hgb 13.2 06/24/23  She has worsening aphasia. Needing more cuing and assistance. No acute complaints per staff. No behaviors or falls. Need lift for transfers prn. Needs assistance with all ADLs. Has incontinence. Very pleasant.   Chronic edema in hands and feet. Weight up 3 lbs in the past 3 months. Wears compression hose. No sob or doe.   She has a hx of very labile BP with orthostatic hypotension. No recent episodes of syncope. BP ranging 120-140s Past Medical History:  Diagnosis Date   Alzheimer disease (HCC)    Balance problem 07/19/2020   Constipation 07/19/2020   Dementia without behavioral disturbance (HCC) 07/19/2020   MMSE 21/30 07/21/20   Fall    Osteoarthritis    Pyelonephritis    Spinal stenosis 07/19/2020   Weakness of left lower extremity 07/19/2020   History reviewed. No pertinent surgical history.  Allergies  Allergen Reactions   Sulfa Antibiotics Rash    Rash to trunk, legs, neck, scalp, and arms    Outpatient Encounter Medications as of 07/31/2023  Medication Sig   sucralfate (CARAFATE) 1 GM/10ML suspension Take 10 mLs (1 g total) by mouth in the morning, at noon, and at bedtime.   Acetaminophen (TYLENOL DISSOLVE PACKS) 500 MG PACK Take 2 packets by mouth in the morning.   amLODipine (NORVASC) 5 MG tablet Take 1 tablet (5 mg total) by mouth daily.  Cranberry (THERACRAN PO) Take 250 mg by mouth 2 (two) times daily.   hydrALAZINE (APRESOLINE) 10 MG tablet Take 10 mg by mouth every 8 (eight) hours as needed (SBP > 180).   losartan (COZAAR) 25 MG tablet Take 25 mg by mouth daily.   nitrofurantoin (MACRODANTIN) 50 MG capsule Take 50 mg by mouth 2 (two) times daily.   pantoprazole (PROTONIX) 20 MG tablet Take 1 tablet (20 mg total) by mouth 2 (two) times daily.   polyethylene glycol (MIRALAX / GLYCOLAX) 17 g packet  Take 17 g by mouth every other day.   senna (SENOKOT) 8.6 MG TABS tablet Take 17.2 mg by mouth at bedtime.   No facility-administered encounter medications on file as of 07/31/2023.    Review of Systems  Unable to perform ROS: Dementia    Immunization History  Administered Date(s) Administered   Influenza, High Dose Seasonal PF 07/25/2021   Influenza-Unspecified 08/05/2018, 09/29/2018, 07/02/2019, 08/21/2019, 08/11/2020, 07/26/2022   Moderna Covid-19 Vaccine Bivalent Booster 16yrs & up 08/01/2021, 08/26/2022, 02/13/2023   Moderna SARS-COV2 Booster Vaccination 08/31/2020, 06/12/2021   Moderna Sars-Covid-2 Vaccination 11/04/2019, 12/02/2019   Pneumococcal Conjugate-13 09/28/2018   Pneumococcal Polysaccharide-23 10/21/2017   Tdap 02/23/2021   Zoster Recombinant(Shingrix) 05/24/2021, 07/24/2021   Pertinent  Health Maintenance Due  Topic Date Due   INFLUENZA VACCINE  05/22/2023   DEXA SCAN  Discontinued      06/03/2022    3:33 PM 10/01/2022    1:09 PM 11/05/2022   10:43 AM 12/05/2022    1:51 PM 02/17/2023    8:50 AM  Fall Risk  Falls in the past year?  0 0 0 0  Was there an injury with Fall?  0 0 0 0  Fall Risk Category Calculator  0 0 0 0  Fall Risk Category (Retired)  Low     (RETIRED) Patient Fall Risk Level High fall risk High fall risk     Patient at Risk for Falls Due to  History of fall(s) History of fall(s) History of fall(s) No Fall Risks  Fall risk Follow up  Falls evaluation completed Falls evaluation completed Falls evaluation completed Falls evaluation completed   Functional Status Survey:    Vitals:   08/01/23 1044  BP: 122/69  Pulse: 70  Resp: 16  Temp: (!) 97 F (36.1 C)  SpO2: 96%  Weight: 159 lb (72.1 kg)   Body mass index is 29.08 kg/m. Wt Readings from Last 3 Encounters:  08/01/23 159 lb (72.1 kg)  06/30/23 157 lb (71.2 kg)  06/25/23 157 lb (71.2 kg)    Physical Exam Vitals and nursing note reviewed.  Constitutional:      General: She is  not in acute distress.    Appearance: She is not diaphoretic.  HENT:     Head: Normocephalic and atraumatic.  Neck:     Vascular: No JVD.  Cardiovascular:     Rate and Rhythm: Normal rate and regular rhythm.     Heart sounds: No murmur heard. Pulmonary:     Effort: Pulmonary effort is normal. No respiratory distress.     Breath sounds: No wheezing.     Comments: Decreased bases Abdominal:     General: Bowel sounds are normal. There is no distension.     Palpations: Abdomen is soft.     Tenderness: There is no abdominal tenderness. There is no right CVA tenderness.  Musculoskeletal:     Comments: 2 + pitting edema BLE. Mild edema to hands and face.  Skin:    General: Skin is warm and dry.  Neurological:     General: No focal deficit present.     Mental Status: She is alert. Mental status is at baseline.  Psychiatric:        Mood and Affect: Mood normal.     Labs reviewed: Recent Labs    05/31/23 1506 06/01/23 0219 06/03/23 0212 06/10/23 0000 06/24/23 1714  NA 137 139  --  139 134*  K 4.0 5.0  --  4.2 3.9  CL 100 104  --  102 98  CO2 28 25  --  27* 28  GLUCOSE 95 91  --   --  105*  BUN 17 13  --  11 17  CREATININE 0.72 0.62  --  0.5 0.59  CALCIUM 9.4 9.0  --  9.1 8.9  MG 2.1  --  1.9  --   --   PHOS  --   --  3.4  --   --    Recent Labs    02/04/23 2205 02/15/23 1448 02/18/23 0000 06/24/23 1714  AST 24 27 28 25   ALT 19 25 25  41  ALKPHOS 82 79 88 93  BILITOT 0.9 0.5  --  0.7  PROT 6.0* 7.4  --  7.3  ALBUMIN 2.7* 3.5 3.8 3.3*   Recent Labs    02/15/23 1448 05/31/23 1506 06/01/23 0219 06/03/23 0212 06/24/23 0000 06/24/23 1714  WBC 5.7   < > 8.4 7.3 14.7  14.7 11.6*  NEUTROABS 3.3  --   --  4.7  --  8.7*  HGB 14.2   < > 12.7 13.3 12.9  12.9 13.2  HCT 46.1*   < > 40.0 41.6 40  40 41.9  MCV 93.3   < > 91.5 86.0  --  89.5  PLT 251   < > 197 195 284  284 255   < > = values in this interval not displayed.   Lab Results  Component Value Date    TSH 0.942 04/25/2022   No results found for: "HGBA1C" Lab Results  Component Value Date   CHOL 235 (A) 07/20/2020   HDL 100 (A) 07/20/2020   LDLCALC 134 07/20/2020   TRIG 102 07/20/2020    Significant Diagnostic Results in last 30 days:  No results found.  Assessment/Plan  1. Peptic ulcer symptoms No further symptoms Hgb stable No aggressive work up due to age/dementia Continue carafate and protonix   2. Late onset Alzheimer's dementia without behavioral disturbance (HCC) Progressive decline in cognition and physical function c/w the disease. Continue supportive care in the skilled environment.  3. Essential hypertension Controlled with norvasc and losartan   4. Generalized edema Monitor weight Continue compression hose If worsening add mild diuretic.   5. Slow transit constipation Continue miralax and senokot.   6. Recurrent UTI Continue macrodantin   Labs/tests ordered:  NA

## 2023-08-05 ENCOUNTER — Encounter: Payer: Self-pay | Admitting: Orthopedic Surgery

## 2023-08-05 ENCOUNTER — Non-Acute Institutional Stay (SKILLED_NURSING_FACILITY): Payer: Medicare Other | Admitting: Orthopedic Surgery

## 2023-08-05 DIAGNOSIS — R601 Generalized edema: Secondary | ICD-10-CM

## 2023-08-05 NOTE — Progress Notes (Signed)
Location:  [facility] Room Number: 140A Place of Service:  SNF [phone]) Provider:  Hazle Nordmann, NP  Mahlon Gammon, MD  Patient Care Team: Mahlon Gammon, MD as PCP - General (Internal Medicine) Jamison Neighbor, MD (Urology)  Extended Emergency Contact Information Primary Emergency Contact: Myron,[name] Address: [address]          [place], [place] [address] [place] of [place] Home Phone: [phone] Work Phone: [phone] Relation: Son Secondary Emergency Contact: Poulsen,Prachi  [place] Mobile Phone: [phone] Relation: Daughter  Code Status:  DNR Goals of care: Advanced Directive information    08/05/2023   11:20 AM  Advanced Directives  Does Patient Have a Medical Advance Directive? Yes  Type of Advance Directive Out of facility DNR (pink MOST or yellow form)  Does patient want to make changes to medical advance directive? No - Patient declined     Chief Complaint  Patient presents with   Acute Visit    Swollen hands    HPI:  Pt is a 88 y.o. female seen today for acute visit due to hand swelling.   She currently resides on the skilled nursing unit at [facility] due to Alzheimer's dementia. Past medical history includes: HTN (SBP > 170), orthostatic bp, PNA with hospitalization 01/2023, recurrent UTI, constipation, neurogenic bladder and spinal stenosis.   H/o generalized edema. She is a poor historian due to advanced dementia. Nursing reports increased edema to bilateral hands. She is able to move hands without difficulty. Hand grips symmetrical. No recent injury or fall reported. She is on a regular diet. She has a h/o swollen extremities in the past with episodes resolving on own. Afebrile. Vitals stable.    Past Medical History:  Diagnosis Date   Alzheimer disease (HCC)    Balance problem 07/19/2020   Constipation 07/19/2020   Dementia without behavioral disturbance (HCC) 07/19/2020    MMSE 21/30 07/21/20   Fall    Osteoarthritis    Pyelonephritis    Spinal stenosis 07/19/2020   Weakness of left lower extremity 07/19/2020   History reviewed. No pertinent surgical history.  Allergies  Allergen Reactions   Sulfa Antibiotics Rash    Rash to trunk, legs, neck, scalp, and arms    Outpatient Encounter Medications as of 08/05/2023  Medication Sig   Acetaminophen (TYLENOL DISSOLVE PACKS) 500 MG PACK Take 2 packets by mouth in the morning.   amLODipine (NORVASC) 5 MG tablet Take 1 tablet (5 mg total) by mouth daily.   Cranberry (THERACRAN PO) Take 250 mg by mouth 2 (two) times daily.   hydrALAZINE (APRESOLINE) 10 MG tablet Take 10 mg by mouth every 8 (eight) hours as needed (SBP > 180).   losartan (COZAAR) 25 MG tablet Take 25 mg by mouth daily.   nitrofurantoin (MACRODANTIN) 50 MG capsule Take 50 mg by mouth 2 (two) times daily.   pantoprazole (PROTONIX) 20 MG tablet Take 1 tablet (20 mg total) by mouth 2 (two) times daily.   polyethylene glycol (MIRALAX / GLYCOLAX) 17 g packet Take 17 g by mouth every other day.   senna (SENOKOT) 8.6 MG TABS tablet Take 17.2 mg by mouth at bedtime.   sucralfate (CARAFATE) 1 GM/10ML suspension Take 10 mLs (1 g total) by mouth in the morning, at noon, and at bedtime.   No facility-administered encounter medications on file as of 08/05/2023.    Review of Systems  Unable to perform ROS: Dementia  Immunization History  Administered Date(s) Administered   Influenza, High Dose Seasonal PF 07/25/2021   Influenza-Unspecified 08/05/2018, 09/29/2018, 07/02/2019, 08/21/2019, 08/11/2020, 07/26/2022   Moderna Covid-19 Vaccine Bivalent Booster 49yrs & up 08/01/2021, 08/26/2022, 02/13/2023   Moderna SARS-COV2 Booster Vaccination 08/31/2020, 06/12/2021   Moderna Sars-Covid-2 Vaccination 11/04/2019, 12/02/2019   Pneumococcal Conjugate-13 09/28/2018   Pneumococcal Polysaccharide-23 10/21/2017   Tdap 02/23/2021   Zoster Recombinant(Shingrix)  05/24/2021, 07/24/2021   Pertinent  Health Maintenance Due  Topic Date Due   INFLUENZA VACCINE  05/22/2023   DEXA SCAN  Discontinued      06/03/2022    3:33 PM 10/01/2022    1:09 PM 11/05/2022   10:43 AM 12/05/2022    1:51 PM 02/17/2023    8:50 AM  Fall Risk  Falls in the past year?  0 0 0 0  Was there an injury with Fall?  0 0 0 0  Fall Risk Category Calculator  0 0 0 0  Fall Risk Category (Retired)  Low     (RETIRED) Patient Fall Risk Level High fall risk High fall risk     Patient at Risk for Falls Due to  History of fall(s) History of fall(s) History of fall(s) No Fall Risks  Fall risk Follow up  Falls evaluation completed Falls evaluation completed Falls evaluation completed Falls evaluation completed   Functional Status Survey:    Vitals:   08/05/23 1117  BP: (!) 170/92  Pulse: 67  Resp: (!) 22  Temp: (!) 97.5 F (36.4 C)  SpO2: 96%  Weight: 159 lb (72.1 kg)  Height: 5\' 2"  (1.575 m)   Body mass index is 29.08 kg/m. Physical Exam Vitals reviewed.  Constitutional:      General: She is not in acute distress. HENT:     Head: Normocephalic.  Eyes:     General:        Right eye: No discharge.        Left eye: No discharge.  Cardiovascular:     Rate and Rhythm: Normal rate and regular rhythm.     Pulses: Normal pulses.     Heart sounds: Normal heart sounds.  Pulmonary:     Effort: Pulmonary effort is normal.     Breath sounds: Normal breath sounds.  Abdominal:     General: Bowel sounds are normal.     Palpations: Abdomen is soft.  Musculoskeletal:     Cervical back: Neck supple.     Right lower leg: Edema present.     Left lower leg: Edema present.     Comments: Non pitting, mild swelling to dorsal hand and fingers on both hands, no apparent injury. Hands with FROM, grips 3/5.   Skin:    General: Skin is warm.     Capillary Refill: Capillary refill takes less than 2 seconds.  Neurological:     General: No focal deficit present.     Mental Status: She is  alert. Mental status is at baseline.     Motor: Weakness present.     Gait: Gait abnormal.     Comments: wheelchair  Psychiatric:        Mood and Affect: Mood normal.     Labs reviewed: Recent Labs    05/31/23 1506 06/01/23 0219 06/03/23 0212 06/10/23 0000 06/24/23 1714  NA 137 139  --  139 134*  K 4.0 5.0  --  4.2 3.9  CL 100 104  --  102 98  CO2 28 25  --  27* 28  GLUCOSE 95 91  --   --  105*  BUN 17 13  --  11 17  CREATININE 0.72 0.62  --  0.5 0.59  CALCIUM 9.4 9.0  --  9.1 8.9  MG 2.1  --  1.9  --   --   PHOS  --   --  3.4  --   --    Recent Labs    02/04/23 2205 02/15/23 1448 02/18/23 0000 06/24/23 1714  AST 24 27 28 25   ALT 19 25 25  41  ALKPHOS 82 79 88 93  BILITOT 0.9 0.5  --  0.7  PROT 6.0* 7.4  --  7.3  ALBUMIN 2.7* 3.5 3.8 3.3*   Recent Labs    02/15/23 1448 05/31/23 1506 06/01/23 0219 06/03/23 0212 06/24/23 0000 06/24/23 1714  WBC 5.7   < > 8.4 7.3 14.7  14.7 11.6*  NEUTROABS 3.3  --   --  4.7  --  8.7*  HGB 14.2   < > 12.7 13.3 12.9  12.9 13.2  HCT 46.1*   < > 40.0 41.6 40  40 41.9  MCV 93.3   < > 91.5 86.0  --  89.5  PLT 251   < > 197 195 284  284 255   < > = values in this interval not displayed.   Lab Results  Component Value Date   TSH 0.942 04/25/2022   No results found for: "HGBA1C" Lab Results  Component Value Date   CHOL 235 (A) 07/20/2020   HDL 100 (A) 07/20/2020   LDLCALC 134 07/20/2020   TRIG 102 07/20/2020    Significant Diagnostic Results in last 30 days:  No results found.  Assessment/Plan 1. Generalized edema - ongoing - increased edema to bilateral dorsal hands/fingers - non pitting edema to BLE - hand grips 3/5, FROM - do not recommend diuretics per goals of care - past episodes resolved on own - advised nursing to limit salt intake     Family/ staff Communication: plan discussed with patient and nurse  Labs/tests ordered:  none

## 2023-08-15 ENCOUNTER — Non-Acute Institutional Stay (SKILLED_NURSING_FACILITY): Payer: Medicare Other | Admitting: Adult Health

## 2023-08-15 DIAGNOSIS — N390 Urinary tract infection, site not specified: Secondary | ICD-10-CM

## 2023-08-15 DIAGNOSIS — R5383 Other fatigue: Secondary | ICD-10-CM | POA: Diagnosis not present

## 2023-08-18 ENCOUNTER — Encounter: Payer: Self-pay | Admitting: Adult Health

## 2023-08-18 NOTE — Progress Notes (Signed)
Location:  Medical illustrator of Service:  SNF (31) Provider:   Peggye Ley, ANP [facility] [phone]   Mahlon Gammon, MD  Patient Care Team: Mahlon Gammon, MD as PCP - General (Internal Medicine) Jamison Neighbor, MD (Urology)  Extended Emergency Contact Information Primary Emergency Contact: Figiel,[name] Address: [address]          [place], [place] [address] [place] Home Phone: [phone] Work Phone: [phone] Relation: Son Secondary Emergency Contact: Shahin,Prachi  [place] Mobile Phone: [phone] Relation: Daughter  Code Status:  DNR Goals of care: Advanced Directive information    08/05/2023   11:20 AM  Advanced Directives  Does Patient Have a Medical Advance Directive? Yes  Type of Advance Directive Out of facility DNR (pink MOST or yellow form)  Does patient want to make changes to medical advance directive? No - Patient declined     Chief Complaint  Patient presents with   Acute Visit    lethargy    HPI:  Pt is a 88 y.o. female seen today for an acute visit for lethargy  PMH significant for dementia, HTN, orthostatic hypotension. Aphasia, spinal stenosis, recurrent UTI, neurogenic bladder, dysphagia, and constipation.  Her nurse reports lethargy, weakness and decreased appetite present for two days. Her son is a physician and he is requesting a UA due to her hx of UTIs. The nurse obtained a UA which is pending. At this time she is more awake and ate breakfast adequately.   She is not having any urinary symptoms. Vitals are stable.  Of note she received both flu and covid vaccines on 10/22  Past Medical History:  Diagnosis Date   Alzheimer disease (HCC)    Balance problem 07/19/2020   Constipation 07/19/2020   Dementia without behavioral disturbance (HCC) 07/19/2020   MMSE 21/30 07/21/20   Fall    Osteoarthritis    Pyelonephritis    Spinal stenosis  07/19/2020   Weakness of left lower extremity 07/19/2020   No past surgical history on file.  Allergies  Allergen Reactions   Sulfa Antibiotics Rash    Rash to trunk, legs, neck, scalp, and arms    Outpatient Encounter Medications as of 08/15/2023  Medication Sig   Acetaminophen (TYLENOL DISSOLVE PACKS) 500 MG PACK Take 2 packets by mouth in the morning.   amLODipine (NORVASC) 5 MG tablet Take 1 tablet (5 mg total) by mouth daily.   Cranberry (THERACRAN PO) Take 250 mg by mouth 2 (two) times daily.   hydrALAZINE (APRESOLINE) 10 MG tablet Take 10 mg by mouth every 8 (eight) hours as needed (SBP > 180).   losartan (COZAAR) 25 MG tablet Take 25 mg by mouth daily.   nitrofurantoin (MACRODANTIN) 50 MG capsule Take 50 mg by mouth 2 (two) times daily.   pantoprazole (PROTONIX) 20 MG tablet Take 1 tablet (20 mg total) by mouth 2 (two) times daily.   polyethylene glycol (MIRALAX / GLYCOLAX) 17 g packet Take 17 g by mouth every other day.   senna (SENOKOT) 8.6 MG TABS tablet Take 17.2 mg by mouth at bedtime.   sucralfate (CARAFATE) 1 GM/10ML suspension Take 10 mLs (1 g total) by mouth in the morning, at noon, and at bedtime.   No facility-administered encounter medications on file as of 08/15/2023.    Review of Systems  Constitutional:  Positive for appetite change and fatigue. Negative for activity change, chills, diaphoresis, fever and  unexpected weight change.  HENT:  Negative for congestion.   Respiratory:  Negative for cough, shortness of breath and wheezing.   Cardiovascular:  Positive for leg swelling. Negative for chest pain and palpitations.  Gastrointestinal:  Negative for abdominal distention, abdominal pain, constipation and diarrhea.  Genitourinary:  Negative for difficulty urinating, dysuria, flank pain, frequency and hematuria.  Musculoskeletal:  Positive for gait problem. Negative for arthralgias, back pain, joint swelling and myalgias.  Neurological:  Negative for dizziness,  tremors, seizures, syncope, facial asymmetry, speech difficulty, weakness, light-headedness, numbness and headaches.  Psychiatric/Behavioral:  Positive for confusion. Negative for agitation and behavioral problems.     Immunization History  Administered Date(s) Administered   Influenza, High Dose Seasonal PF 07/25/2021   Influenza-Unspecified 08/05/2018, 09/29/2018, 07/02/2019, 08/21/2019, 08/11/2020, 07/26/2022   Moderna Covid-19 Vaccine Bivalent Booster 12yrs & up 08/01/2021, 08/26/2022, 02/13/2023   Moderna SARS-COV2 Booster Vaccination 08/31/2020, 06/12/2021   Moderna Sars-Covid-2 Vaccination 11/04/2019, 12/02/2019   Pneumococcal Conjugate-13 09/28/2018   Pneumococcal Polysaccharide-23 10/21/2017   Tdap 02/23/2021   Zoster Recombinant(Shingrix) 05/24/2021, 07/24/2021   Pertinent  Health Maintenance Due  Topic Date Due   INFLUENZA VACCINE  05/22/2023   DEXA SCAN  Discontinued      06/03/2022    3:33 PM 10/01/2022    1:09 PM 11/05/2022   10:43 AM 12/05/2022    1:51 PM 02/17/2023    8:50 AM  Fall Risk  Falls in the past year?  0 0 0 0  Was there an injury with Fall?  0 0 0 0  Fall Risk Category Calculator  0 0 0 0  Fall Risk Category (Retired)  Low     (RETIRED) Patient Fall Risk Level High fall risk High fall risk     Patient at Risk for Falls Due to  History of fall(s) History of fall(s) History of fall(s) No Fall Risks  Fall risk Follow up  Falls evaluation completed Falls evaluation completed Falls evaluation completed Falls evaluation completed   Functional Status Survey:    Vitals:   08/15/23 1156  BP: 120/81  Pulse: 69  Resp: 18  Temp: (!) 97.2 F (36.2 C)  SpO2: 100%  Weight: 159 lb (72.1 kg)   Body mass index is 29.08 kg/m.  Physical Exam Vitals and nursing note reviewed.  Constitutional:      General: She is not in acute distress.    Appearance: She is not diaphoretic.  HENT:     Head: Normocephalic and atraumatic.     Mouth/Throat:     Mouth:  Mucous membranes are moist.     Pharynx: Oropharynx is clear.  Eyes:     Conjunctiva/sclera: Conjunctivae normal.     Pupils: Pupils are equal, round, and reactive to light.  Neck:     Vascular: No JVD.  Cardiovascular:     Rate and Rhythm: Normal rate and regular rhythm.     Heart sounds: No murmur heard. Pulmonary:     Effort: Pulmonary effort is normal. No respiratory distress.     Breath sounds: No wheezing.     Comments: Decreased bases  Abdominal:     General: Bowel sounds are normal. There is no distension.     Palpations: Abdomen is soft.     Tenderness: There is no abdominal tenderness. There is no right CVA tenderness or left CVA tenderness.  Musculoskeletal:     Cervical back: No rigidity.     Comments: BLE edema +2 pitting ALso has generalized edema to face and hands.  Lymphadenopathy:     Cervical: No cervical adenopathy.  Skin:    General: Skin is warm and dry.  Neurological:     General: No focal deficit present.     Mental Status: She is alert. Mental status is at baseline.  Psychiatric:        Mood and Affect: Mood normal.     Labs reviewed: Recent Labs    05/31/23 1506 06/01/23 0219 06/03/23 0212 06/10/23 0000 06/24/23 1714  NA 137 139  --  139 134*  K 4.0 5.0  --  4.2 3.9  CL 100 104  --  102 98  CO2 28 25  --  27* 28  GLUCOSE 95 91  --   --  105*  BUN 17 13  --  11 17  CREATININE 0.72 0.62  --  0.5 0.59  CALCIUM 9.4 9.0  --  9.1 8.9  MG 2.1  --  1.9  --   --   PHOS  --   --  3.4  --   --    Recent Labs    02/04/23 2205 02/15/23 1448 02/18/23 0000 06/24/23 1714  AST 24 27 28 25   ALT 19 25 25  41  ALKPHOS 82 79 88 93  BILITOT 0.9 0.5  --  0.7  PROT 6.0* 7.4  --  7.3  ALBUMIN 2.7* 3.5 3.8 3.3*   Recent Labs    02/15/23 1448 05/31/23 1506 06/01/23 0219 06/03/23 0212 06/24/23 0000 06/24/23 1714  WBC 5.7   < > 8.4 7.3 14.7  14.7 11.6*  NEUTROABS 3.3  --   --  4.7  --  8.7*  HGB 14.2   < > 12.7 13.3 12.9  12.9 13.2  HCT 46.1*    < > 40.0 41.6 40  40 41.9  MCV 93.3   < > 91.5 86.0  --  89.5  PLT 251   < > 197 195 284  284 255   < > = values in this interval not displayed.   Lab Results  Component Value Date   TSH 0.942 04/25/2022   No results found for: "HGBA1C" Lab Results  Component Value Date   CHOL 235 (A) 07/20/2020   HDL 100 (A) 07/20/2020   LDLCALC 134 07/20/2020   TRIG 102 07/20/2020    Significant Diagnostic Results in last 30 days:  No results found.  Assessment/Plan  1. Lethargy Improving Possibly due to vaccinations Stable with no vital sign abnormalities UA Pending  2. Recurrent UTI Currently on  macrodantin for UTI prophylaxis.    Labs/tests ordered:  UA C and S ordered and pending.

## 2023-08-21 ENCOUNTER — Telehealth: Payer: Medicare Other | Admitting: Adult Health

## 2023-08-21 MED ORDER — CIPROFLOXACIN HCL 500 MG PO TABS: 500.0000 mg | ORAL_TABLET | Freq: Two times a day (BID) | ORAL | Status: AC

## 2023-08-21 NOTE — Telephone Encounter (Signed)
Final culture grew >100,000 colonies of Citrobacter She is on keflex. Organism is resistant to cephalosporins accept Cefepime which does not have an oral equivalent. She has a sulfa allergy. Will d/c keflex and prescribe Cipro for 5 days.

## 2023-08-26 ENCOUNTER — Non-Acute Institutional Stay (SKILLED_NURSING_FACILITY): Payer: Medicare Other | Admitting: Orthopedic Surgery

## 2023-08-26 ENCOUNTER — Encounter: Payer: Self-pay | Admitting: Orthopedic Surgery

## 2023-08-26 DIAGNOSIS — I10 Essential (primary) hypertension: Secondary | ICD-10-CM | POA: Diagnosis not present

## 2023-08-26 DIAGNOSIS — N390 Urinary tract infection, site not specified: Secondary | ICD-10-CM

## 2023-08-26 DIAGNOSIS — G301 Alzheimer's disease with late onset: Secondary | ICD-10-CM

## 2023-08-26 DIAGNOSIS — K5901 Slow transit constipation: Secondary | ICD-10-CM

## 2023-08-26 DIAGNOSIS — N3000 Acute cystitis without hematuria: Secondary | ICD-10-CM | POA: Diagnosis not present

## 2023-08-26 DIAGNOSIS — R198 Other specified symptoms and signs involving the digestive system and abdomen: Secondary | ICD-10-CM

## 2023-08-26 DIAGNOSIS — F0280 Dementia in other diseases classified elsewhere without behavioral disturbance: Secondary | ICD-10-CM

## 2023-08-26 NOTE — Progress Notes (Signed)
Location:  [facility] Room Number: 140/A Place of Service:  SNF [phone]) Provider:  Octavia Heir, NP   Mahlon Gammon, MD  Patient Care Team: Mahlon Gammon, MD as PCP - General (Internal Medicine) Jamison Neighbor, MD (Urology)  Extended Emergency Contact Information Primary Emergency Contact: Justman,[name] Address: [address]          [place], [place] [address] [place] Home Phone: [phone] Work Phone: [phone] Relation: Son Secondary Emergency Contact: Dahlen,Prachi  [place] Mobile Phone: [phone] Relation: Daughter  Code Status:  DNR Goals of care: Advanced Directive information    08/05/2023   11:20 AM  Advanced Directives  Does Patient Have a Medical Advance Directive? Yes  Type of Advance Directive Out of facility DNR (pink MOST or yellow form)  Does patient want to make changes to medical advance directive? No - Patient declined     Chief Complaint  Patient presents with   Medical Management of Chronic Issues    HPI:  Pt is a 88 y.o. female seen today for medical management of chronic diseases.    She currently resides on the skilled nursing unit at [facility] due to Alzheimer's dementia. Past medical history includes: HTN (SBP > 170), orthostatic bp, PNA with hospitalization 01/2023, recurrent UTI, constipation, neurogenic bladder and spinal stenosis.   Acute cystitis- recent urine culture > 100,000 cfu/mL citrobacter, keflex discontinued and Cipro started 10/31 x 5 days Peptic ulcer symptoms- 09/12 H.pylori negative, hgb 12.9 06/25/2023 HTN- BUN/creat 17/0.59 06/24/2023, see trends below, remains on losartan and hydralazine prn for SBP> 180 Alzheimer's- MMSE 21/30, MRI brain noted moderate chronic ischemic changes in cerebral white matter, no behaviors, aphasia, will refuse medications at times, not on medication Recurrent UTI- remains on nitrofurantoin and cranberry  supplement Constipation- remains on miralax, senna and colace  Recent blood pressures:  11/05- 156/86  11/06- 178/76, 165/94, 157/86  11/03- 153/83  Recent weights:  11/01- 160.4 lbs  10/02- 159 lbs  09/01- 159 lbs      Past Medical History:  Diagnosis Date   Alzheimer disease (HCC)    Balance problem 07/19/2020   Constipation 07/19/2020   Dementia without behavioral disturbance (HCC) 07/19/2020   MMSE 21/30 07/21/20   Fall    Osteoarthritis    Pyelonephritis    Spinal stenosis 07/19/2020   Weakness of left lower extremity 07/19/2020   No past surgical history on file.  Allergies  Allergen Reactions   Sulfa Antibiotics Rash    Rash to trunk, legs, neck, scalp, and arms    Outpatient Encounter Medications as of 08/26/2023  Medication Sig   Acetaminophen (TYLENOL DISSOLVE PACKS) 500 MG PACK Take 2 packets by mouth in the morning.   amLODipine (NORVASC) 5 MG tablet Take 1 tablet (5 mg total) by mouth daily.   ciprofloxacin (CIPRO) 500 MG tablet Take 1 tablet (500 mg total) by mouth 2 (two) times daily for 5 days.   Cranberry (THERACRAN PO) Take 250 mg by mouth 2 (two) times daily.   hydrALAZINE (APRESOLINE) 10 MG tablet Take 10 mg by mouth every 8 (eight) hours as needed (SBP > 180).   losartan (COZAAR) 25 MG tablet Take 25 mg by mouth daily.   nitrofurantoin (MACRODANTIN) 50 MG capsule Take 50 mg by mouth 2 (two) times daily.   pantoprazole (PROTONIX) 20 MG tablet Take 1 tablet (20 mg total) by mouth 2 (two) times daily.   polyethylene glycol (  MIRALAX / GLYCOLAX) 17 g packet Take 17 g by mouth every other day.   senna (SENOKOT) 8.6 MG TABS tablet Take 17.2 mg by mouth at bedtime.   sucralfate (CARAFATE) 1 GM/10ML suspension Take 10 mLs (1 g total) by mouth in the morning, at noon, and at bedtime.   No facility-administered encounter medications on file as of 08/26/2023.    Review of Systems  Unable to perform ROS: Dementia    Immunization History  Administered Date(s)  Administered   Fluad Quad(high Dose 65+) 08/12/2023   Influenza, High Dose Seasonal PF 07/25/2021   Influenza-Unspecified 08/05/2018, 09/29/2018, 07/02/2019, 08/21/2019, 08/11/2020, 07/26/2022   Moderna Covid-19 Vaccine Bivalent Booster 70yrs & up 08/01/2021, 08/26/2022, 02/13/2023   Moderna SARS-COV2 Booster Vaccination 08/31/2020, 06/12/2021, 08/12/2023   Moderna Sars-Covid-2 Vaccination 11/04/2019, 12/02/2019   Pneumococcal Conjugate-13 09/28/2018   Pneumococcal Polysaccharide-23 10/21/2017   Tdap 02/23/2021   Zoster Recombinant(Shingrix) 05/24/2021, 07/24/2021   Pertinent  Health Maintenance Due  Topic Date Due   INFLUENZA VACCINE  Completed   DEXA SCAN  Discontinued      06/03/2022    3:33 PM 10/01/2022    1:09 PM 11/05/2022   10:43 AM 12/05/2022    1:51 PM 02/17/2023    8:50 AM  Fall Risk  Falls in the past year?  0 0 0 0  Was there an injury with Fall?  0 0 0 0  Fall Risk Category Calculator  0 0 0 0  Fall Risk Category (Retired)  Low     (RETIRED) Patient Fall Risk Level High fall risk High fall risk     Patient at Risk for Falls Due to  History of fall(s) History of fall(s) History of fall(s) No Fall Risks  Fall risk Follow up  Falls evaluation completed Falls evaluation completed Falls evaluation completed Falls evaluation completed   Functional Status Survey:    Vitals:   08/26/23 1203  BP: (!) 156/86  Pulse: 73  Resp: 18  Temp: (!) 97 F (36.1 C)  SpO2: 95%  Weight: 160 lb 6.4 oz (72.8 kg)  Height: 5\' 2"  (1.575 m)   Body mass index is 29.34 kg/m. Physical Exam Vitals reviewed.  Constitutional:      General: She is not in acute distress. HENT:     Head: Normocephalic.     Right Ear: There is no impacted cerumen.     Left Ear: There is no impacted cerumen.     Nose: Nose normal.     Mouth/Throat:     Mouth: Mucous membranes are moist.  Eyes:     General:        Right eye: No discharge.        Left eye: No discharge.  Neck:     Thyroid: No thyroid  mass or thyromegaly.     Vascular: No carotid bruit.  Cardiovascular:     Rate and Rhythm: Normal rate and regular rhythm.     Pulses: Normal pulses.     Heart sounds: Normal heart sounds.  Pulmonary:     Effort: Pulmonary effort is normal.     Breath sounds: Normal breath sounds.  Abdominal:     General: Bowel sounds are normal.     Palpations: Abdomen is soft.  Musculoskeletal:     Cervical back: Neck supple.     Right lower leg: Edema present.     Left lower leg: Edema present.     Comments: Non pitting, compression stockings on  Lymphadenopathy:  Cervical: No cervical adenopathy.  Skin:    General: Skin is warm.     Capillary Refill: Capillary refill takes less than 2 seconds.  Neurological:     General: No focal deficit present.     Mental Status: She is alert. Mental status is at baseline.     Motor: Weakness present.     Gait: Gait abnormal.  Psychiatric:        Mood and Affect: Mood normal.     Comments: Follows commands, alert to self/familiar face     Labs reviewed: Recent Labs    05/31/23 1506 06/01/23 0219 06/03/23 0212 06/10/23 0000 06/24/23 1714  NA 137 139  --  139 134*  K 4.0 5.0  --  4.2 3.9  CL 100 104  --  102 98  CO2 28 25  --  27* 28  GLUCOSE 95 91  --   --  105*  BUN 17 13  --  11 17  CREATININE 0.72 0.62  --  0.5 0.59  CALCIUM 9.4 9.0  --  9.1 8.9  MG 2.1  --  1.9  --   --   PHOS  --   --  3.4  --   --    Recent Labs    02/04/23 2205 02/15/23 1448 02/18/23 0000 06/24/23 1714  AST 24 27 28 25   ALT 19 25 25  41  ALKPHOS 82 79 88 93  BILITOT 0.9 0.5  --  0.7  PROT 6.0* 7.4  --  7.3  ALBUMIN 2.7* 3.5 3.8 3.3*   Recent Labs    02/15/23 1448 05/31/23 1506 06/01/23 0219 06/03/23 0212 06/24/23 0000 06/24/23 1714  WBC 5.7   < > 8.4 7.3 14.7  14.7 11.6*  NEUTROABS 3.3  --   --  4.7  --  8.7*  HGB 14.2   < > 12.7 13.3 12.9  12.9 13.2  HCT 46.1*   < > 40.0 41.6 40  40 41.9  MCV 93.3   < > 91.5 86.0  --  89.5  PLT 251   < >  197 195 284  284 255   < > = values in this interval not displayed.   Lab Results  Component Value Date   TSH 0.942 04/25/2022   No results found for: "HGBA1C" Lab Results  Component Value Date   CHOL 235 (A) 07/20/2020   HDL 100 (A) 07/20/2020   LDLCALC 134 07/20/2020   TRIG 102 07/20/2020    Significant Diagnostic Results in last 30 days:  No results found.  Assessment/Plan 1. Acute cystitis without hematuria - 10/31 urine culture > 100,000 cfu/mL citrobacter - keflex discontinued - cont Cipro x 5 days - cont nitrofurantoin and cranberry for recurrent UTI  2. Peptic ulcer symptoms - hgb stable - h.pylori negative  - asymptomatic - recommend discontinuing sucralfate if HPOA approves - cont Protonix  3. Essential hypertension - uncontrolled, goal 150/90 - h/o hypotension - cont amlodipine and losartan - change hydralazine prn to SBP> 170  4. Late onset Alzheimer's dementia without behavioral disturbance (HCC) - no behaviors - dependent with ADLs - weight stable - ambulates with wheelchair - not on medication - cont skilled nursing  5. Slow transit constipation - abdomen soft - cont senna and miralax    Family/ staff Communication: plan discussed with patient and nurse  Labs/tests ordered:  none

## 2023-09-08 ENCOUNTER — Non-Acute Institutional Stay (SKILLED_NURSING_FACILITY): Payer: Self-pay | Admitting: Adult Health

## 2023-09-08 ENCOUNTER — Encounter: Payer: Self-pay | Admitting: Adult Health

## 2023-09-08 DIAGNOSIS — I10 Essential (primary) hypertension: Secondary | ICD-10-CM

## 2023-09-08 DIAGNOSIS — R601 Generalized edema: Secondary | ICD-10-CM

## 2023-09-08 MED ORDER — POTASSIUM CHLORIDE CRYS ER 10 MEQ PO TBCR
10.0000 meq | EXTENDED_RELEASE_TABLET | Freq: Every day | ORAL | Status: DC
Start: 2023-09-08 — End: 2023-10-05

## 2023-09-08 MED ORDER — AMLODIPINE BESYLATE 5 MG PO TABS: 2.5000 mg | ORAL_TABLET | Freq: Every day | ORAL | Status: DC

## 2023-09-08 MED ORDER — FUROSEMIDE 20 MG PO TABS: 20.0000 mg | ORAL_TABLET | Freq: Every day | ORAL | Status: DC

## 2023-09-08 NOTE — Progress Notes (Signed)
Location:  [facility] Room Number: 140A Place of Service:  SNF [id number]) Provider:  Tamsen Roers, MD  Patient Care Team: Mahlon Gammon, MD as PCP - General (Internal Medicine) Jamison Neighbor, MD (Urology)  Extended Emergency Contact Information Primary Emergency Contact: Overdorf,[name] Address: [address]          [place], [place] [address] [place] Home Phone: [phone] Work Phone: [phone] Relation: Son Secondary Emergency Contact: Massmann,Prachi  [place] Mobile Phone: [phone] Relation: Daughter  Code Status:  DNR Goals of care: Advanced Directive information    09/08/2023    1:24 PM  Advanced Directives  Does Patient Have a Medical Advance Directive? Yes  Type of Advance Directive Out of facility DNR (pink MOST or yellow form)  Does patient want to make changes to medical advance directive? No - Patient declined     Chief Complaint  Patient presents with   Acute Visit    Patient is being seen for acute edema     HPI:  Pt is a 88 y.o. female seen today for an acute visit for edema  She has gained 5 lbs in the past few days.  Staff reporting increased edema to her arms, legs, and face.  No sob or cough. She states she feels fine, just a little tired Overall a poor historian due to dementia.  No echo for review. Hx of HTN and labile BP with orthostatic hypotension  BNP 22.1 3 months ago.   SBP ranging 111-170/70-90s  Also her family is requesting a PT eval to see if she can get in and out of a car She has general weakness and requires a lift for standing.   Past Medical History:  Diagnosis Date   Alzheimer disease (HCC)    Balance problem 07/19/2020   Constipation 07/19/2020   Dementia without behavioral disturbance (HCC) 07/19/2020   MMSE 21/30 07/21/20   Fall    Osteoarthritis    Pyelonephritis    Spinal stenosis 07/19/2020   Weakness of left  lower extremity 07/19/2020   History reviewed. No pertinent surgical history.  Allergies  Allergen Reactions   Sulfa Antibiotics Rash    Rash to trunk, legs, neck, scalp, and arms    Outpatient Encounter Medications as of 09/08/2023  Medication Sig   Acetaminophen (TYLENOL DISSOLVE PACKS) 500 MG PACK Take 2 packets by mouth in the morning.   amLODipine (NORVASC) 5 MG tablet Take 1 tablet (5 mg total) by mouth daily.   Cranberry (THERACRAN PO) Take 250 mg by mouth 2 (two) times daily.   hydrALAZINE (APRESOLINE) 10 MG tablet Take 10 mg by mouth every 8 (eight) hours as needed (SBP > 180).   losartan (COZAAR) 25 MG tablet Take 25 mg by mouth daily.   nitrofurantoin (MACRODANTIN) 50 MG capsule Take 50 mg by mouth 2 (two) times daily.   pantoprazole (PROTONIX) 20 MG tablet Take 1 tablet (20 mg total) by mouth 2 (two) times daily.   polyethylene glycol (MIRALAX / GLYCOLAX) 17 g packet Take 17 g by mouth every other day.   senna (SENOKOT) 8.6 MG TABS tablet Take 17.2 mg by mouth at bedtime.   No facility-administered encounter medications on file as of 09/08/2023.    Review of Systems  Unable to perform ROS: Dementia    Immunization History  Administered Date(s) Administered   Fluad Quad(high Dose 65+) 08/12/2023   Influenza, High Dose Seasonal  PF 07/25/2021   Influenza-Unspecified 08/05/2018, 09/29/2018, 07/02/2019, 08/21/2019, 08/11/2020, 07/26/2022   Moderna Covid-19 Vaccine Bivalent Booster 23yrs & up 08/01/2021, 08/26/2022, 02/13/2023   Moderna SARS-COV2 Booster Vaccination 08/31/2020, 06/12/2021, 08/12/2023   Moderna Sars-Covid-2 Vaccination 11/04/2019, 12/02/2019   Pneumococcal Conjugate-13 09/28/2018   Pneumococcal Polysaccharide-23 10/21/2017   Tdap 02/23/2021   Zoster Recombinant(Shingrix) 05/24/2021, 07/24/2021   Pertinent  Health Maintenance Due  Topic Date Due   INFLUENZA VACCINE  Completed   DEXA SCAN  Discontinued      06/03/2022    3:33 PM 10/01/2022    1:09  PM 11/05/2022   10:43 AM 12/05/2022    1:51 PM 02/17/2023    8:50 AM  Fall Risk  Falls in the past year?  0 0 0 0  Was there an injury with Fall?  0 0 0 0  Fall Risk Category Calculator  0 0 0 0  Fall Risk Category (Retired)  Low     (RETIRED) Patient Fall Risk Level High fall risk High fall risk     Patient at Risk for Falls Due to  History of fall(s) History of fall(s) History of fall(s) No Fall Risks  Fall risk Follow up  Falls evaluation completed Falls evaluation completed Falls evaluation completed Falls evaluation completed   Functional Status Survey:    Vitals:   09/08/23 1321  BP: (!) 148/76  Pulse: 76  Resp: 20  Temp: 97.8 F (36.6 C)  TempSrc: Temporal  SpO2: 93%  Weight: 164 lb (74.4 kg)  Height: 5\' 2"  (1.575 m)   Body mass index is 30 kg/m. Physical Exam Vitals and nursing note reviewed.  Constitutional:      General: She is not in acute distress.    Appearance: She is not diaphoretic.  HENT:     Head: Normocephalic and atraumatic.     Mouth/Throat:     Mouth: Mucous membranes are moist.     Pharynx: Oropharynx is clear.  Neck:     Vascular: No JVD.  Cardiovascular:     Rate and Rhythm: Normal rate and regular rhythm.     Heart sounds: No murmur heard. Pulmonary:     Effort: Pulmonary effort is normal. No respiratory distress.     Breath sounds: Normal breath sounds. No wheezing.  Abdominal:     General: Bowel sounds are normal. There is no distension.     Palpations: Abdomen is soft.  Musculoskeletal:        General: Swelling (generalized swelling to arms, legs face) present.     Comments: BLE edema +2 pitting  Skin:    General: Skin is warm and dry.  Neurological:     General: No focal deficit present.     Mental Status: She is alert. Mental status is at baseline.  Psychiatric:        Mood and Affect: Mood normal.     Labs reviewed: Recent Labs    05/31/23 1506 06/01/23 0219 06/03/23 0212 06/10/23 0000 06/24/23 1714  NA 137 139  --   139 134*  K 4.0 5.0  --  4.2 3.9  CL 100 104  --  102 98  CO2 28 25  --  27* 28  GLUCOSE 95 91  --   --  105*  BUN 17 13  --  11 17  CREATININE 0.72 0.62  --  0.5 0.59  CALCIUM 9.4 9.0  --  9.1 8.9  MG 2.1  --  1.9  --   --  PHOS  --   --  3.4  --   --    Recent Labs    02/04/23 2205 02/15/23 1448 02/18/23 0000 06/24/23 1714  AST 24 27 28 25   ALT 19 25 25  41  ALKPHOS 82 79 88 93  BILITOT 0.9 0.5  --  0.7  PROT 6.0* 7.4  --  7.3  ALBUMIN 2.7* 3.5 3.8 3.3*   Recent Labs    02/15/23 1448 05/31/23 1506 06/01/23 0219 06/03/23 0212 06/24/23 0000 06/24/23 1714  WBC 5.7   < > 8.4 7.3 14.7  14.7 11.6*  NEUTROABS 3.3  --   --  4.7  --  8.7*  HGB 14.2   < > 12.7 13.3 12.9  12.9 13.2  HCT 46.1*   < > 40.0 41.6 40  40 41.9  MCV 93.3   < > 91.5 86.0  --  89.5  PLT 251   < > 197 195 284  284 255   < > = values in this interval not displayed.   Lab Results  Component Value Date   TSH 0.942 04/25/2022   No results found for: "HGBA1C" Lab Results  Component Value Date   CHOL 235 (A) 07/20/2020   HDL 100 (A) 07/20/2020   LDLCALC 134 07/20/2020   TRIG 102 07/20/2020    Significant Diagnostic Results in last 30 days:  No results found.  Assessment/Plan  1. Generalized edema  - furosemide (LASIX) 20 MG tablet; Take 1 tablet (20 mg total) by mouth daily for 3 days. - potassium chloride (KLOR-CON M) 10 MEQ tablet; Take 1 tablet (10 mEq total) by mouth daily for 3 days. Check CMP  2. Essential hypertension Reduce Norvasc to 2.5 mg due to edema Adding lasix for HTN and edema Will likely need additional meds and her son is requesting we add metoprolol Will add after lasix finishes since she has a hx of orthostatic hypotension we wanted to do one med at a time.   3. Gait abnormality PT eval and treat.   Family/ staff Communication: nurse communicated to her son Dr Sherryll Burger  Labs/tests ordered:  CMP CBC 09/11/23

## 2023-09-23 IMAGING — CT CT HEAD W/O CM
4 series · 16 of 47 positions shown, 18 images · non-contrast
Comparison: None.

CLINICAL DATA: TIA symptoms

EXAM:
CT HEAD WITHOUT CONTRAST
TECHNIQUE: Contiguous axial images were obtained from the base of the skull
through the vertex without intravenous contrast.

[Series 3: cor soft · coronal · 0.29mm/px · 3 of 55 slices shown]
[im 19/55  brain]
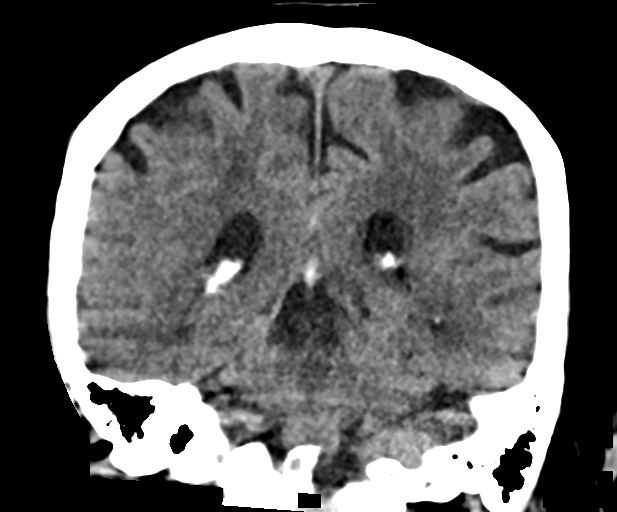
[im 25/55  brain]
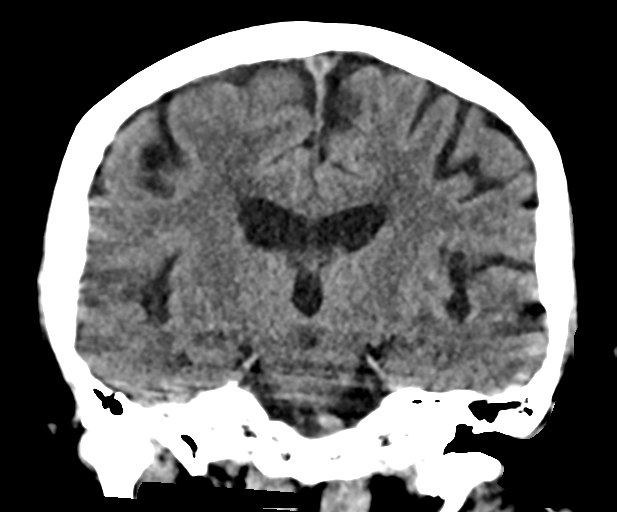
[im 31/55  brain]
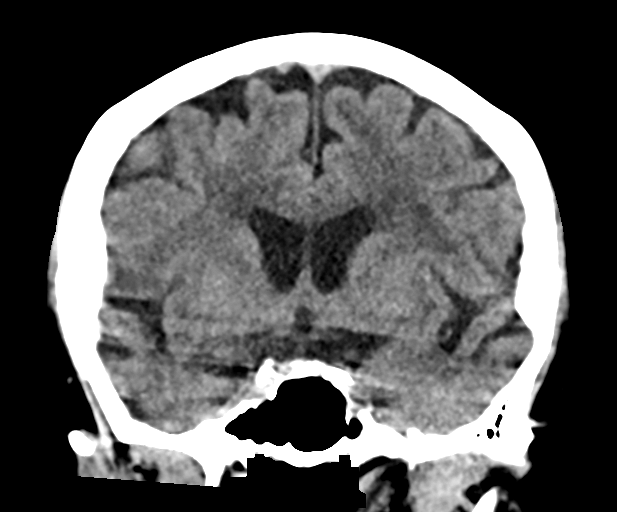

[Series 4: sag soft · sagittal · 0.29mm/px · 3 of 50 slices shown]
[im 17/50  brain]
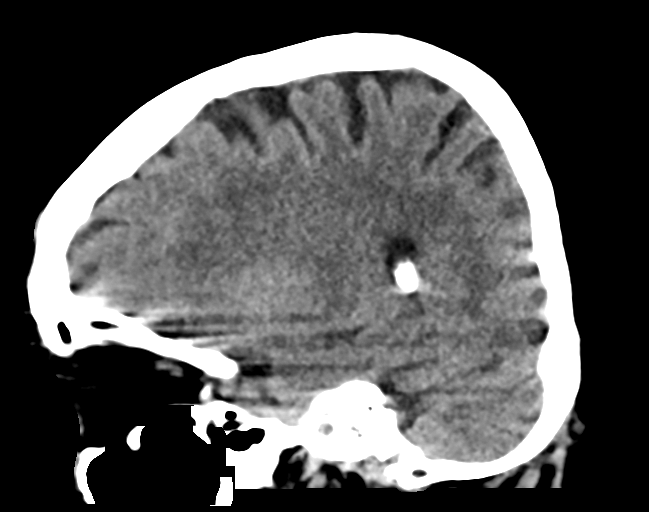
[im 25/50  brain]
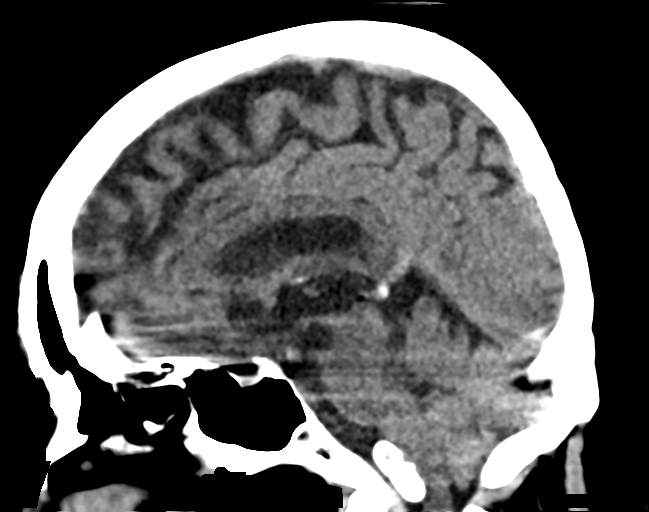
[im 33/50  brain]
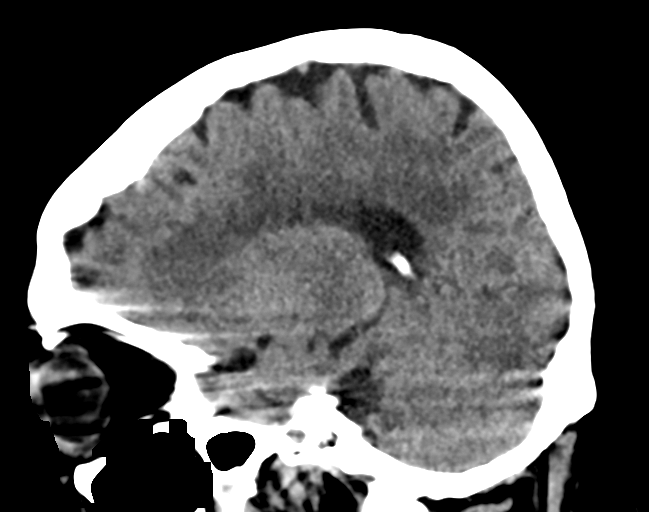

[Series 5: head wo · axial · 0.39mm/px · z∈[+988,+1108]mm · 7 of 32 slices shown, 9 images]
[im 4/32  brain]
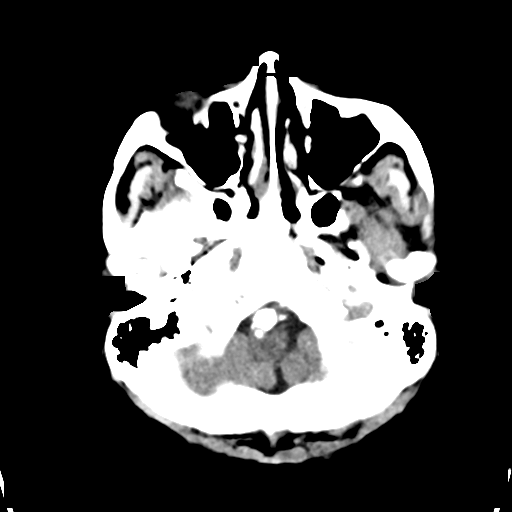
[im 4/32  bone]
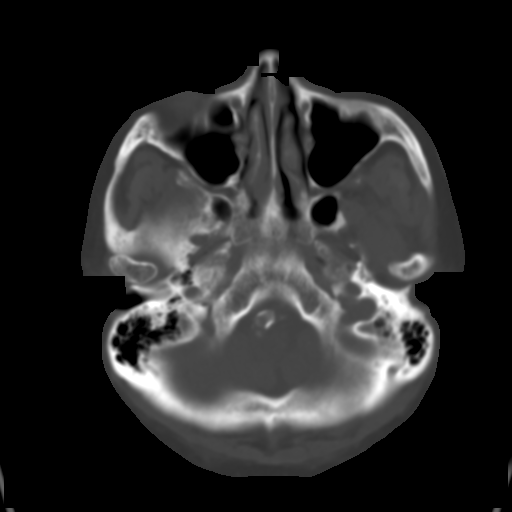
[im 8/32  brain]
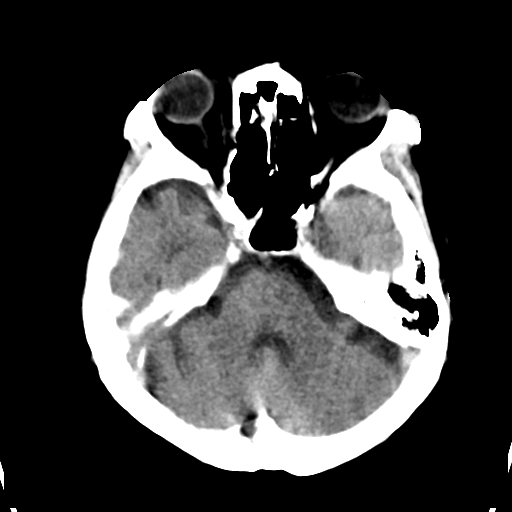
[im 12/32  brain]
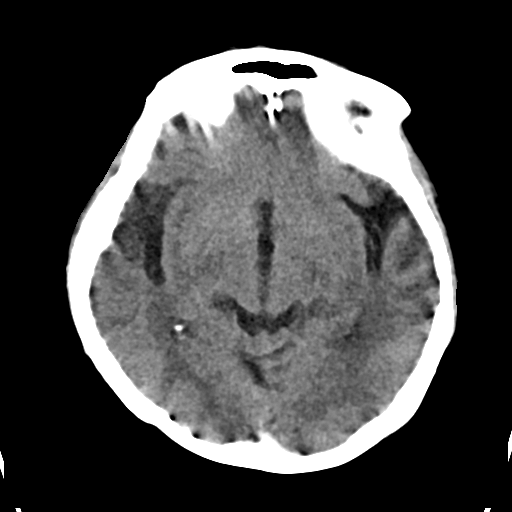
[im 16/32  brain]
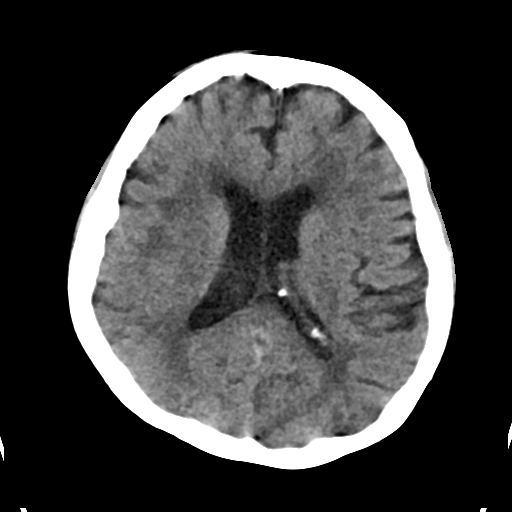
[im 20/32  brain]
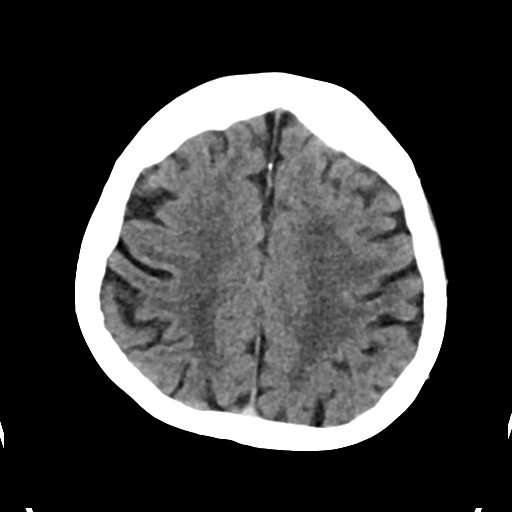
[im 20/32  bone]
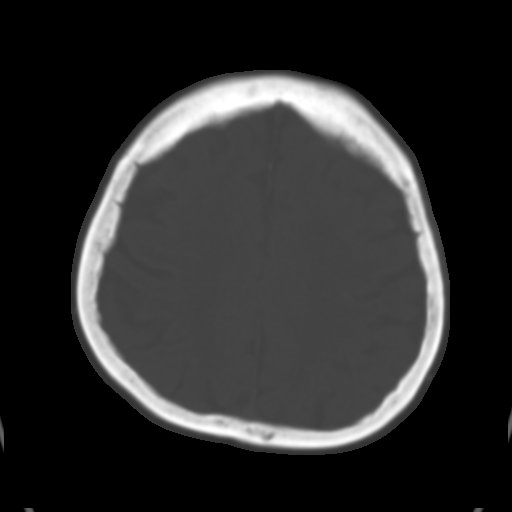
[im 24/32  brain]
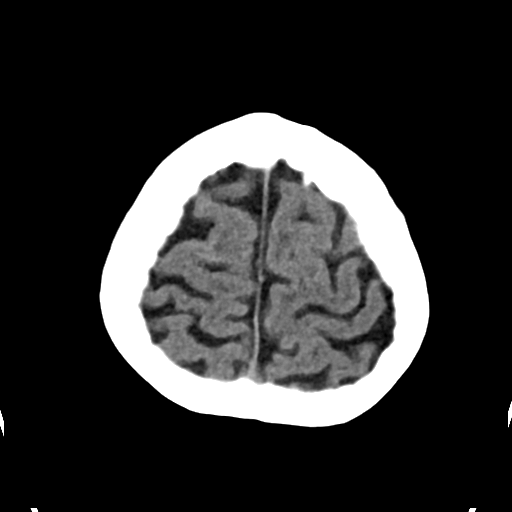
[im 28/32  brain]
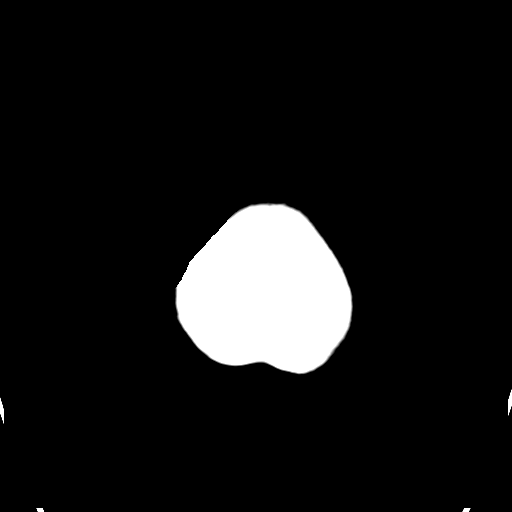

[Series 6: head bone · axial · 0.39mm/px · z∈[+986,+1018]mm · 3 of 78 slices shown]
[im 8/78  bone]
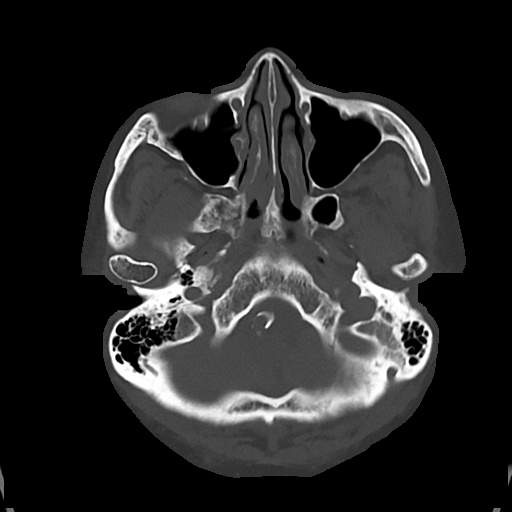
[im 16/78  bone]
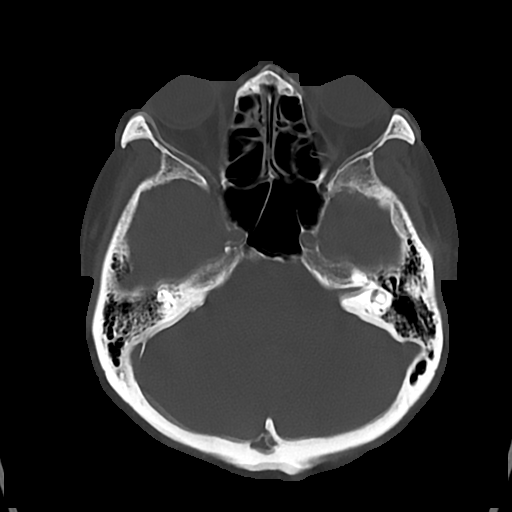
[im 24/78  bone]
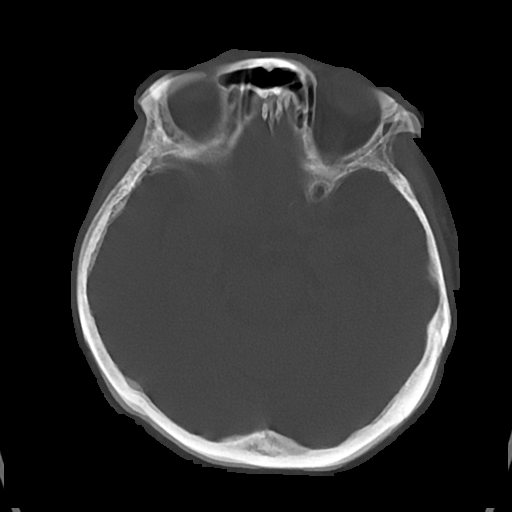

[16 of 47 positions shown; findings below may reference images not displayed]

FINDINGS: Brain: Patient motion limits evaluation of the skull base. There are
scattered hypodensities throughout the periventricular and
subcortical white matter most consistent with age-indeterminate
small vessel ischemic change. Chronic infarct superior left
cerebellar hemisphere. No evidence of acute hemorrhage. Lateral
ventricles and remaining midline structures are unremarkable. No
acute extra-axial fluid collections. No mass effect.

Vascular: Diffuse atherosclerosis of the internal carotid arteries
and basilar artery. No hyperdense vessel.

Skull: Normal. Negative for fracture or focal lesion.

Sinuses/Orbits: No acute finding.

Other: None.
IMPRESSION: 1. Patient motion limits evaluation.
2. Hypodensities throughout the periventricular and subcortical
white matter, most consistent with chronic small vessel ischemic
change. Chronic left cerebellar infarct.
3. Otherwise no acute infarct or hemorrhage.

## 2023-09-29 ENCOUNTER — Non-Acute Institutional Stay (SKILLED_NURSING_FACILITY): Payer: Self-pay | Admitting: Internal Medicine

## 2023-09-29 DIAGNOSIS — N390 Urinary tract infection, site not specified: Secondary | ICD-10-CM | POA: Diagnosis not present

## 2023-09-29 DIAGNOSIS — R2681 Unsteadiness on feet: Secondary | ICD-10-CM | POA: Diagnosis not present

## 2023-09-29 DIAGNOSIS — I10 Essential (primary) hypertension: Secondary | ICD-10-CM

## 2023-09-29 DIAGNOSIS — G301 Alzheimer's disease with late onset: Secondary | ICD-10-CM

## 2023-09-29 DIAGNOSIS — F0280 Dementia in other diseases classified elsewhere without behavioral disturbance: Secondary | ICD-10-CM

## 2023-10-03 NOTE — Progress Notes (Unsigned)
Location:  [facility] illustr[facility] of Service:  SNF (31)  Provider:   Code Status: DNR Goals of Care:     09/08/2023    1:24 PM  Advanced Directives  Does Patient Have a Medical Advance Directive? Yes  Type of Advance Directive Out of facility DNR (pink MOST or yellow form)  Does patient want to make changes to medical advance directive? No - Patient declined     Chief Complaint  Patient presents with  . Care Management    HPI: Patient is a 88 y.o. female seen today for medical management of chronic diseases.     Past Medical History:  Diagnosis Date  . Alzheimer disease (HCC)   . Balance problem 07/19/2020  . Constipation 07/19/2020  . Dementia without behavioral disturbance (HCC) 07/19/2020   MMSE 21/30 07/21/20  . Fall   . Osteoarthritis   . Pyelonephritis   . Spinal stenosis 07/19/2020  . Weakness of left lower extremity 07/19/2020    No past surgical history on file.  Allergies  Allergen Reactions  . Sulfa Antibiotics Rash    Rash to trunk, legs, neck, scalp, and arms    Outpatient Encounter Medications as of 09/29/2023  Medication Sig  . Acetaminophen (TYLENOL DISSOLVE PACKS) 500 MG PACK Take 2 packets by mouth in the morning.  Marland Kitchen amLODipine (NORVASC) 5 MG tablet Take 0.5 tablets (2.5 mg total) by mouth daily.  . Cranberry (THERACRAN PO) Take 250 mg by mouth 2 (two) times daily.  . hydrALAZINE (APRESOLINE) 10 MG tablet Take 10 mg by mouth every 8 (eight) hours as needed (SBP > 180).  Marland Kitchen losartan (COZAAR) 25 MG tablet Take 25 mg by mouth daily.  . nitrofurantoin (MACRODANTIN) 50 MG capsule Take 50 mg by mouth 2 (two) times daily.  . pantoprazole (PROTONIX) 20 MG tablet Take 1 tablet (20 mg total) by mouth 2 (two) times daily.  . polyethylene glycol (MIRALAX / GLYCOLAX) 17 g packet Take 17 g by mouth every other day.  . senna (SENOKOT) 8.6 MG TABS tablet Take 17.2 mg by mouth at bedtime.   No facility-administered encounter medications on  file as of 09/29/2023.    Review of Systems:  Review of Systems  Health Maintenance  Topic Date Due  . COVID-19 Vaccine (9 - 2024-25 season) 10/07/2023  . Medicare Annual Wellness (AWV)  11/23/2023  . DTaP/Tdap/Td (2 - Td or Tdap) 02/24/2031  . Pneumonia Vaccine 51+ Years old  Completed  . INFLUENZA VACCINE  Completed  . Zoster Vaccines- Shingrix  Completed  . HPV VACCINES  Aged Out  . DEXA SCAN  Discontinued    Physical Exam: There were no vitals filed for this visit. There is no height or weight on file to calculate BMI. Physical Exam  Labs reviewed: Basic Metabolic Panel: Recent Labs    05/31/23 1506 06/01/23 0219 06/03/23 0212 06/10/23 0000 06/24/23 1714  NA 137 139  --  139 134*  K 4.0 5.0  --  4.2 3.9  CL 100 104  --  102 98  CO2 28 25  --  27* 28  GLUCOSE 95 91  --   --  105*  BUN 17 13  --  11 17  CREATININE 0.72 0.62  --  0.5 0.59  CALCIUM 9.4 9.0  --  9.1 8.9  MG 2.1  --  1.9  --   --   PHOS  --   --  3.4  --   --  Liver Function Tests: Recent Labs    02/04/23 2205 02/15/23 1448 02/18/23 0000 06/24/23 1714  AST 24 27 28 25   ALT 19 25 25  41  ALKPHOS 82 79 88 93  BILITOT 0.9 0.5  --  0.7  PROT 6.0* 7.4  --  7.3  ALBUMIN 2.7* 3.5 3.8 3.3*   Recent Labs    06/24/23 1714  LIPASE 26   Recent Labs    02/15/23 1448  AMMONIA 12   CBC: Recent Labs    02/15/23 1448 05/31/23 1506 06/01/23 0219 06/03/23 0212 06/24/23 0000 06/24/23 1714  WBC 5.7   < > 8.4 7.3 14.7  14.7 11.6*  NEUTROABS 3.3  --   --  4.7  --  8.7*  HGB 14.2   < > 12.7 13.3 12.9  12.9 13.2  HCT 46.1*   < > 40.0 41.6 40  40 41.9  MCV 93.3   < > 91.5 86.0  --  89.5  PLT 251   < > 197 195 284  284 255   < > = values in this interval not displayed.   Lipid Panel: No results for input(s): "CHOL", "HDL", "LDLCALC", "TRIG", "CHOLHDL", "LDLDIRECT" in the last 8760 hours. No results found for: "HGBA1C"  Procedures since last visit: No results  found.  Assessment/Plan 1. Essential hypertension (Primary) ***  2. Late onset Alzheimer's dementia without behavioral disturbance (HCC) ***  3. Recurrent UTI ***  4. Unstable gait ***    Labs/tests ordered:  * No order type specified * Next appt:  Visit date not found

## 2023-10-05 ENCOUNTER — Encounter: Payer: Self-pay | Admitting: Internal Medicine

## 2023-10-27 ENCOUNTER — Non-Acute Institutional Stay (SKILLED_NURSING_FACILITY): Payer: Self-pay | Admitting: Adult Health

## 2023-10-27 ENCOUNTER — Encounter: Payer: Self-pay | Admitting: Adult Health

## 2023-10-27 DIAGNOSIS — G301 Alzheimer's disease with late onset: Secondary | ICD-10-CM

## 2023-10-27 DIAGNOSIS — F0280 Dementia in other diseases classified elsewhere without behavioral disturbance: Secondary | ICD-10-CM

## 2023-10-27 DIAGNOSIS — R0989 Other specified symptoms and signs involving the circulatory and respiratory systems: Secondary | ICD-10-CM | POA: Diagnosis not present

## 2023-10-27 DIAGNOSIS — R601 Generalized edema: Secondary | ICD-10-CM

## 2023-10-27 DIAGNOSIS — K5901 Slow transit constipation: Secondary | ICD-10-CM | POA: Diagnosis not present

## 2023-10-27 DIAGNOSIS — R1310 Dysphagia, unspecified: Secondary | ICD-10-CM

## 2023-10-27 DIAGNOSIS — N390 Urinary tract infection, site not specified: Secondary | ICD-10-CM

## 2023-10-27 NOTE — Progress Notes (Signed)
 Location:  [facility] of Service:  SNF (31) Provider:   Bari America, ANP [facility] [phone]   Charlanne Fredia CROME, MD  Patient Care Team: Charlanne Fredia CROME, MD as PCP - General (Internal Medicine) Janit Lamar PARAS, MD (Urology)  Extended Emergency Contact Information Primary Emergency Contact: Eblen,[name] Address: [address]          [place], [place] [address] [place] Home Phone: [phone] Work Phone: [phone] Relation: Son Secondary Emergency Contact: Heberlein,Prachi  [place] Phone: [phone] Relation: Daughter  Code Status:  DNR Goals of care: Advanced Directive information    09/08/2023    1:24 PM  Advanced Directives  Does Patient Have a Medical Advance Directive? Yes  Type of Advance Directive Out of facility DNR (pink MOST or yellow form)  Does patient want to make changes to medical advance directive? No - Patient declined     Chief Complaint  Patient presents with   Medical Management of Chronic Issues    HPI:  Pt is a 89 y.o. female seen today for medical management of chronic diseases.   PMH significant for dementia, HTN, orthostatic hypotension, Aphasia, spinal stenosis, recurrent UTI, neurogenic bladder, dysphagia, and constipation.  She was hospitalized in August of 2024 for UTI and pna.  Speech therapy evaluated her indicated that she needed a D3 diet and asp prec.  Swallow study in 02/06/23 showed presbyphagia, no overt aspiration.  CT of the head showed no acute abnormality. Mental status improved during her stay  Sent to the ED weakness and concern for GI bleeding. She was hemoccult negative in the hospital and HGb was stable so she was released. Seen by NP Amy 06/25/23 placed on carafate  for two episodes of emesis as well as protonix . H pylori test ordered and was negative.   No further incidence of bleeidng.  Hgb 13.2 06/24/23  She has  worsening aphasia. Needing more cuing and assistance. Need lift for transfers prn. Needs assistance with all ADLs. Has incontinence.    Chronic edema in hands and feet. Weight is stable. Did have lasix  in November for edema.   Wt Readings from Last 3 Encounters:  10/27/23 164 lb 4.8 oz (74.5 kg)  09/29/23 163 lb (73.9 kg)  09/08/23 164 lb (74.4 kg)     She has a hx of very labile BP with orthostatic hypotension. No recent episodes of syncope.   BPs reviewed in matrix Blood Pressure: 137 / 79 mmHg  Blood Pressure: 147 / 78 mmHg   Blood Pressure: 163 / 85 mmHg  Blood Pressure: 138 / 87 mmHg  Blood Pressure: 129 / 81 mmHg  Blood Pressure: 137 / 70 mmHg Past Medical History:  Diagnosis Date   Alzheimer disease (HCC)    Balance problem 07/19/2020   Constipation 07/19/2020   Dementia without behavioral disturbance (HCC) 07/19/2020   MMSE 21/30 07/21/20   Fall    Osteoarthritis    Pyelonephritis    Spinal stenosis 07/19/2020   Weakness of left lower extremity 07/19/2020   History reviewed. No pertinent surgical history.  Allergies  Allergen Reactions   Sulfa Antibiotics Rash    Rash to trunk, legs, neck, scalp, and arms    Outpatient Encounter Medications as of 10/27/2023  Medication Sig   Acetaminophen  (TYLENOL  DISSOLVE PACKS) 500 MG PACK Take 2 packets by mouth in the morning.   amLODipine  (NORVASC ) 5 MG tablet Take 0.5 tablets (2.5 mg  total) by mouth daily.   Cranberry (THERACRAN PO) Take 250 mg by mouth 2 (two) times daily.   hydrALAZINE  (APRESOLINE ) 10 MG tablet Take 10 mg by mouth every 8 (eight) hours as needed (SBP > 180).   losartan  (COZAAR ) 25 MG tablet Take 25 mg by mouth daily.   nitrofurantoin  (MACRODANTIN ) 50 MG capsule Take 50 mg by mouth 2 (two) times daily.   pantoprazole  (PROTONIX ) 20 MG tablet Take 1 tablet (20 mg total) by mouth 2 (two) times daily.   polyethylene glycol (MIRALAX  / GLYCOLAX ) 17 g packet Take 17 g by mouth every other day.   senna (SENOKOT)  8.6 MG TABS tablet Take 17.2 mg by mouth at bedtime.   No facility-administered encounter medications on file as of 10/27/2023.    Review of Systems  Constitutional:  Negative for activity change, appetite change, chills, diaphoresis, fatigue, fever and unexpected weight change.  HENT:  Negative for congestion.   Respiratory:  Negative for cough, shortness of breath and wheezing.   Cardiovascular:  Positive for leg swelling. Negative for chest pain and palpitations.  Gastrointestinal:  Negative for abdominal distention, abdominal pain, constipation and diarrhea.  Genitourinary:  Negative for difficulty urinating and dysuria.  Musculoskeletal:  Positive for gait problem. Negative for arthralgias, back pain, joint swelling and myalgias.  Neurological:  Negative for dizziness, tremors, seizures, syncope, facial asymmetry, speech difficulty, weakness, light-headedness, numbness and headaches.  Psychiatric/Behavioral:  Positive for confusion. Negative for agitation and behavioral problems.    Immunization History  Administered Date(s) Administered   Fluad Quad(high Dose 65+) 08/12/2023   Influenza, High Dose Seasonal PF 07/25/2021   Influenza-Unspecified 08/05/2018, 09/29/2018, 07/02/2019, 08/21/2019, 08/11/2020, 07/26/2022   Moderna Covid-19 Vaccine Bivalent Booster 50yrs & up 08/01/2021, 08/26/2022, 02/13/2023   Moderna SARS-COV2 Booster Vaccination 08/31/2020, 06/12/2021, 08/12/2023   Moderna Sars-Covid-2 Vaccination 11/04/2019, 12/02/2019   Pneumococcal Conjugate-13 09/28/2018   Pneumococcal Polysaccharide-23 10/21/2017   Tdap 02/23/2021   Zoster Recombinant(Shingrix) 05/24/2021, 07/24/2021   Pertinent  Health Maintenance Due  Topic Date Due   INFLUENZA VACCINE  Completed   DEXA SCAN  Discontinued      06/03/2022    3:33 PM 10/01/2022    1:09 PM 11/05/2022   10:43 AM 12/05/2022    1:51 PM 02/17/2023    8:50 AM  Fall Risk  Falls in the past year?  0 0 0 0  Was there an injury with  Fall?  0 0 0 0  Fall Risk Category Calculator  0 0 0 0  Fall Risk Category (Retired)  Low     (RETIRED) Patient Fall Risk Level High fall risk High fall risk     Patient at Risk for Falls Due to  History of fall(s) History of fall(s) History of fall(s) No Fall Risks  Fall risk Follow up  Falls evaluation completed Falls evaluation completed Falls evaluation completed Falls evaluation completed   Functional Status Survey:    Vitals:   10/27/23 1305  BP: (!) 143/82  Pulse: 76  Resp: 16  SpO2: 97%  Weight: 164 lb 4.8 oz (74.5 kg)   Body mass index is 30.05 kg/m. Physical Exam Vitals and nursing note reviewed.  Constitutional:      General: She is not in acute distress.    Appearance: She is not diaphoretic.  HENT:     Head: Normocephalic and atraumatic.  Neck:     Vascular: No JVD.  Cardiovascular:     Rate and Rhythm: Normal rate and regular rhythm.  Heart sounds: No murmur heard. Pulmonary:     Effort: Pulmonary effort is normal. No respiratory distress.     Breath sounds: Normal breath sounds. No wheezing.  Musculoskeletal:     Comments: BLE edema +1  Skin:    General: Skin is warm and dry.  Neurological:     General: No focal deficit present.     Mental Status: She is alert. Mental status is at baseline.  Psychiatric:        Mood and Affect: Mood normal.   Labs reviewed: Recent Labs    05/31/23 1506 06/01/23 0219 06/03/23 0212 06/10/23 0000 06/24/23 1714  NA 137 139  --  139 134*  K 4.0 5.0  --  4.2 3.9  CL 100 104  --  102 98  CO2 28 25  --  27* 28  GLUCOSE 95 91  --   --  105*  BUN 17 13  --  11 17  CREATININE 0.72 0.62  --  0.5 0.59  CALCIUM 9.4 9.0  --  9.1 8.9  MG 2.1  --  1.9  --   --   PHOS  --   --  3.4  --   --    Recent Labs    02/04/23 2205 02/15/23 1448 02/18/23 0000 06/24/23 1714  AST 24 27 28 25   ALT 19 25 25  41  ALKPHOS 82 79 88 93  BILITOT 0.9 0.5  --  0.7  PROT 6.0* 7.4  --  7.3  ALBUMIN 2.7* 3.5 3.8 3.3*   Recent Labs     02/15/23 1448 05/31/23 1506 06/01/23 0219 06/03/23 0212 06/24/23 0000 06/24/23 1714  WBC 5.7   < > 8.4 7.3 14.7  14.7 11.6*  NEUTROABS 3.3  --   --  4.7  --  8.7*  HGB 14.2   < > 12.7 13.3 12.9  12.9 13.2  HCT 46.1*   < > 40.0 41.6 40  40 41.9  MCV 93.3   < > 91.5 86.0  --  89.5  PLT 251   < > 197 195 284  284 255   < > = values in this interval not displayed.   Lab Results  Component Value Date   TSH 0.942 04/25/2022   No results found for: HGBA1C Lab Results  Component Value Date   CHOL 235 (A) 07/20/2020   HDL 100 (A) 07/20/2020   LDLCALC 134 07/20/2020   TRIG 102 07/20/2020    Significant Diagnostic Results in last 30 days:  No results found.  Assessment/Plan  1. Labile hypertension (Primary) Well controlled at this time Continue losartan  and norvasc    2. Late onset Alzheimer's dementia without behavioral disturbance (HCC) Progressive decline in cognition and physical function c/w the disease. Continue supportive care in the skilled environment.  3. Recurrent UTI On macrodantin  prophylaxis  4. Slow transit constipation Continue miralax  and senokot  5. Generalized edema Improved  6. Dysphagia Continue modified diet asp prec   7. Hx of GIB  Check CBC, on protonix       Family/ staff Communication: nurse  Labs/tests ordered:  CBC BMP

## 2023-11-07 ENCOUNTER — Inpatient Hospital Stay (HOSPITAL_COMMUNITY)
Admission: EM | Admit: 2023-11-07 | Discharge: 2023-11-13 | DRG: 872 | Disposition: A | Discharge status: Skilled Nursing Facility | Payer: Medicare Other | Source: Skilled Nursing Facility | Attending: Internal Medicine | Admitting: Internal Medicine

## 2023-11-07 DIAGNOSIS — N390 Urinary tract infection, site not specified: Secondary | ICD-10-CM | POA: Diagnosis present

## 2023-11-07 DIAGNOSIS — A419 Sepsis, unspecified organism: Secondary | ICD-10-CM | POA: Diagnosis not present

## 2023-11-07 DIAGNOSIS — G309 Alzheimer's disease, unspecified: Secondary | ICD-10-CM | POA: Diagnosis present

## 2023-11-07 DIAGNOSIS — N319 Neuromuscular dysfunction of bladder, unspecified: Secondary | ICD-10-CM | POA: Diagnosis present

## 2023-11-07 DIAGNOSIS — Z79899 Other long term (current) drug therapy: Secondary | ICD-10-CM

## 2023-11-07 DIAGNOSIS — R531 Weakness: Secondary | ICD-10-CM

## 2023-11-07 DIAGNOSIS — F0280 Dementia in other diseases classified elsewhere without behavioral disturbance: Secondary | ICD-10-CM | POA: Diagnosis present

## 2023-11-07 DIAGNOSIS — K219 Gastro-esophageal reflux disease without esophagitis: Secondary | ICD-10-CM | POA: Diagnosis present

## 2023-11-07 DIAGNOSIS — I10 Essential (primary) hypertension: Secondary | ICD-10-CM | POA: Diagnosis present

## 2023-11-07 DIAGNOSIS — Z882 Allergy status to sulfonamides status: Secondary | ICD-10-CM

## 2023-11-07 DIAGNOSIS — E8809 Other disorders of plasma-protein metabolism, not elsewhere classified: Secondary | ICD-10-CM | POA: Diagnosis present

## 2023-11-07 DIAGNOSIS — R4701 Aphasia: Secondary | ICD-10-CM | POA: Diagnosis present

## 2023-11-07 DIAGNOSIS — I4510 Unspecified right bundle-branch block: Secondary | ICD-10-CM | POA: Diagnosis present

## 2023-11-07 DIAGNOSIS — F0390 Unspecified dementia without behavioral disturbance: Secondary | ICD-10-CM | POA: Diagnosis present

## 2023-11-07 DIAGNOSIS — R339 Retention of urine, unspecified: Secondary | ICD-10-CM | POA: Clinically undetermined

## 2023-11-07 DIAGNOSIS — I714 Abdominal aortic aneurysm, without rupture, unspecified: Secondary | ICD-10-CM | POA: Diagnosis present

## 2023-11-07 DIAGNOSIS — Z7409 Other reduced mobility: Secondary | ICD-10-CM | POA: Diagnosis present

## 2023-11-07 DIAGNOSIS — E876 Hypokalemia: Secondary | ICD-10-CM | POA: Diagnosis not present

## 2023-11-07 DIAGNOSIS — I712 Thoracic aortic aneurysm, without rupture, unspecified: Secondary | ICD-10-CM | POA: Diagnosis present

## 2023-11-07 DIAGNOSIS — N3000 Acute cystitis without hematuria: Principal | ICD-10-CM

## 2023-11-07 DIAGNOSIS — R471 Dysarthria and anarthria: Secondary | ICD-10-CM | POA: Diagnosis present

## 2023-11-07 DIAGNOSIS — Z66 Do not resuscitate: Secondary | ICD-10-CM | POA: Diagnosis present

## 2023-11-07 DIAGNOSIS — Z7189 Other specified counseling: Secondary | ICD-10-CM

## 2023-11-07 DIAGNOSIS — Z87440 Personal history of urinary (tract) infections: Secondary | ICD-10-CM

## 2023-11-07 LAB — CBC WITH DIFFERENTIAL/PLATELET
Abs Immature Granulocytes: 0.07 10*3/uL (ref 0.00–0.07)
Basophils Absolute: 0.1 10*3/uL (ref 0.0–0.1)
Basophils Relative: 1 %
Eosinophils Absolute: 0.2 10*3/uL (ref 0.0–0.5)
Eosinophils Relative: 1 %
HCT: 43.9 % (ref 36.0–46.0)
Hemoglobin: 13.9 g/dL (ref 12.0–15.0)
Immature Granulocytes: 1 %
Lymphocytes Relative: 9 %
Lymphs Abs: 1.2 10*3/uL (ref 0.7–4.0)
MCH: 28 pg (ref 26.0–34.0)
MCHC: 31.7 g/dL (ref 30.0–36.0)
MCV: 88.5 fL (ref 80.0–100.0)
Monocytes Absolute: 1.3 10*3/uL — ABNORMAL HIGH (ref 0.1–1.0)
Monocytes Relative: 10 %
Neutro Abs: 10.3 10*3/uL — ABNORMAL HIGH (ref 1.7–7.7)
Neutrophils Relative %: 78 %
Platelets: 191 10*3/uL (ref 150–400)
RBC: 4.96 MIL/uL (ref 3.87–5.11)
RDW: 13.6 % (ref 11.5–15.5)
WBC: 13.1 10*3/uL — ABNORMAL HIGH (ref 4.0–10.5)
nRBC: 0 % (ref 0.0–0.2)

## 2023-11-07 LAB — BASIC METABOLIC PANEL
Anion gap: 12 (ref 5–15)
BUN: 22 mg/dL (ref 8–23)
CO2: 24 mmol/L (ref 22–32)
Calcium: 9.2 mg/dL (ref 8.9–10.3)
Chloride: 103 mmol/L (ref 98–111)
Creatinine, Ser: 0.85 mg/dL (ref 0.44–1.00)
GFR, Estimated: >60 mL/min (ref >60)
Glucose, Bld: 181 mg/dL — ABNORMAL HIGH (ref 70–99)
Potassium: 4.2 mmol/L (ref 3.5–5.1)
Sodium: 139 mmol/L (ref 135–145)

## 2023-11-07 NOTE — ED Triage Notes (Signed)
Pt to ED via [facility] from [facility] , c/o decrease appetite. apparently pt not eating as much x 1 day. Pt orientation at baseline. HX dementia . Facility was going to do work up there, pt family requesting pt to go to ED. Pt voicing no complaints.  Pt has also had diarrhea x 1 day.  No medications given by EMS.   Last VS 138/82, hr 78, 96%RA, 97.5, CBG 234.

## 2023-11-07 NOTE — ED Provider Notes (Signed)
[facility] AT [facility] Provider Note   CSN: [id number] Arrival date & time: 11/07/23  2012     History {Add pertinent medical, surgical, social history, OB history to HPI:1} Chief Complaint  Patient presents with   Diarrhea    Cheryl Monroe is a 89 y.o. female.   Diarrhea      Home Medications Prior to Admission medications   Medication Sig Start Date End Date Taking? Authorizing Provider  Acetaminophen (TYLENOL DISSOLVE PACKS) 500 MG PACK Take 2 packets by mouth in the morning.    [provider]  amLODipine (NORVASC) 5 MG tablet Take 0.5 tablets (2.5 mg total) by mouth daily. 09/08/23 09/07/24  Fletcher Anon, NP  Cranberry (THERACRAN PO) Take 250 mg by mouth 2 (two) times daily.    [provider]  hydrALAZINE (APRESOLINE) 10 MG tablet Take 10 mg by mouth every 8 (eight) hours as needed (SBP > 180).    [provider]  losartan (COZAAR) 25 MG tablet Take 25 mg by mouth daily.    [provider]  nitrofurantoin (MACRODANTIN) 50 MG capsule Take 50 mg by mouth 2 (two) times daily.    [provider]  pantoprazole (PROTONIX) 20 MG tablet Take 1 tablet (20 mg total) by mouth 2 (two) times daily. 06/25/23   Fargo, Amy E, NP  polyethylene glycol (MIRALAX / GLYCOLAX) 17 g packet Take 17 g by mouth every other day.    [provider]  senna (SENOKOT) 8.6 MG TABS tablet Take 17.2 mg by mouth at bedtime.    [provider]      Allergies    Sulfa antibiotics    Review of Systems   Review of Systems  Gastrointestinal:  Positive for diarrhea.    Physical Exam Updated Vital Signs BP (!) 128/57 (BP Location: Right Arm)   Pulse 79   Temp 97.6 F (36.4 C) (Axillary)   Resp 20   SpO2 96%  Physical Exam  ED Results / Procedures / Treatments   Labs (all labs ordered are listed, but only abnormal results are displayed) Labs Reviewed  CBC WITH DIFFERENTIAL/PLATELET - Abnormal;  Notable for the following components:      Result Value   WBC 13.1 (*)    Neutro Abs 10.3 (*)    Monocytes Absolute 1.3 (*)    All other components within normal limits  BASIC METABOLIC PANEL - Abnormal; Notable for the following components:   Glucose, Bld 181 (*)    All other components within normal limits  GASTROINTESTINAL PANEL BY PCR, STOOL (REPLACES STOOL CULTURE)  C DIFFICILE QUICK SCREEN W PCR REFLEX    URINALYSIS, ROUTINE W REFLEX MICROSCOPIC    EKG None  Radiology No results found.  Procedures Procedures  {Document cardiac monitor, telemetry assessment procedure when appropriate:1}  Medications Ordered in ED Medications - No data to display  ED Course/ Medical Decision Making/ A&P   {   Click here for ABCD2, HEART and other calculatorsREFRESH Note before signing :1}                              Medical Decision Making Amount and/or Complexity of Data Reviewed Labs: ordered. Radiology: ordered.   ***  {Document critical care time when appropriate:1} {Document review of labs and clinical decision tools ie heart score, Chads2Vasc2 etc:1}  {Document your independent review of radiology images, and any outside records:1} {Document your discussion  with family members, caretakers, and with consultants:1} {Document social determinants of health affecting pt's care:1} {Document your decision making why or why not admission, treatments were needed:1} Final Clinical Impression(s) / ED Diagnoses Final diagnoses:  None    Rx / DC Orders ED Discharge Orders     None

## 2023-11-07 NOTE — ED Notes (Signed)
RN spoke with son and son stated that pt does get UTIs often. If anymore information is needed son can be contacted at anytime.

## 2023-11-08 ENCOUNTER — Emergency Department (HOSPITAL_COMMUNITY): Payer: Medicare Other

## 2023-11-08 DIAGNOSIS — R4701 Aphasia: Secondary | ICD-10-CM | POA: Diagnosis present

## 2023-11-08 DIAGNOSIS — Z79899 Other long term (current) drug therapy: Secondary | ICD-10-CM | POA: Diagnosis not present

## 2023-11-08 DIAGNOSIS — I10 Essential (primary) hypertension: Secondary | ICD-10-CM

## 2023-11-08 DIAGNOSIS — R339 Retention of urine, unspecified: Secondary | ICD-10-CM | POA: Diagnosis not present

## 2023-11-08 DIAGNOSIS — E8809 Other disorders of plasma-protein metabolism, not elsewhere classified: Secondary | ICD-10-CM | POA: Diagnosis present

## 2023-11-08 DIAGNOSIS — Z7409 Other reduced mobility: Secondary | ICD-10-CM | POA: Diagnosis present

## 2023-11-08 DIAGNOSIS — I714 Abdominal aortic aneurysm, without rupture, unspecified: Secondary | ICD-10-CM | POA: Diagnosis present

## 2023-11-08 DIAGNOSIS — R471 Dysarthria and anarthria: Secondary | ICD-10-CM | POA: Diagnosis present

## 2023-11-08 DIAGNOSIS — Z882 Allergy status to sulfonamides status: Secondary | ICD-10-CM | POA: Diagnosis not present

## 2023-11-08 DIAGNOSIS — N390 Urinary tract infection, site not specified: Secondary | ICD-10-CM | POA: Diagnosis present

## 2023-11-08 DIAGNOSIS — A419 Sepsis, unspecified organism: Principal | ICD-10-CM | POA: Diagnosis present

## 2023-11-08 DIAGNOSIS — N319 Neuromuscular dysfunction of bladder, unspecified: Secondary | ICD-10-CM | POA: Diagnosis present

## 2023-11-08 DIAGNOSIS — E876 Hypokalemia: Secondary | ICD-10-CM | POA: Diagnosis not present

## 2023-11-08 DIAGNOSIS — I4510 Unspecified right bundle-branch block: Secondary | ICD-10-CM | POA: Diagnosis present

## 2023-11-08 DIAGNOSIS — K219 Gastro-esophageal reflux disease without esophagitis: Secondary | ICD-10-CM | POA: Diagnosis present

## 2023-11-08 DIAGNOSIS — K5901 Slow transit constipation: Secondary | ICD-10-CM | POA: Diagnosis not present

## 2023-11-08 DIAGNOSIS — G309 Alzheimer's disease, unspecified: Secondary | ICD-10-CM | POA: Diagnosis present

## 2023-11-08 DIAGNOSIS — F0390 Unspecified dementia without behavioral disturbance: Secondary | ICD-10-CM

## 2023-11-08 DIAGNOSIS — F0280 Dementia in other diseases classified elsewhere without behavioral disturbance: Secondary | ICD-10-CM | POA: Diagnosis present

## 2023-11-08 DIAGNOSIS — R531 Weakness: Secondary | ICD-10-CM

## 2023-11-08 DIAGNOSIS — I712 Thoracic aortic aneurysm, without rupture, unspecified: Secondary | ICD-10-CM | POA: Diagnosis present

## 2023-11-08 DIAGNOSIS — Z87440 Personal history of urinary (tract) infections: Secondary | ICD-10-CM | POA: Diagnosis not present

## 2023-11-08 DIAGNOSIS — R0989 Other specified symptoms and signs involving the circulatory and respiratory systems: Secondary | ICD-10-CM | POA: Diagnosis not present

## 2023-11-08 DIAGNOSIS — Z66 Do not resuscitate: Secondary | ICD-10-CM | POA: Diagnosis present

## 2023-11-08 LAB — URINALYSIS, ROUTINE W REFLEX MICROSCOPIC
Bilirubin Urine: NEGATIVE
Glucose, UA: NEGATIVE mg/dL
Hgb urine dipstick: NEGATIVE
Ketones, ur: NEGATIVE mg/dL
Nitrite: NEGATIVE
Protein, ur: NEGATIVE mg/dL
Specific Gravity, Urine: 1.025 (ref 1.005–1.030)
pH: 6 (ref 5.0–8.0)

## 2023-11-08 LAB — HEPATIC FUNCTION PANEL
ALT: 18 U/L (ref 0–44)
AST: 29 U/L (ref 15–41)
Albumin: 3.2 g/dL — ABNORMAL LOW (ref 3.5–5.0)
Alkaline Phosphatase: 87 U/L (ref 38–126)
Bilirubin, Direct: 0.2 mg/dL (ref 0.0–0.2)
Indirect Bilirubin: 0.1 mg/dL — ABNORMAL LOW (ref 0.3–0.9)
Total Bilirubin: 0.3 mg/dL (ref 0.0–1.2)
Total Protein: 6.3 g/dL — ABNORMAL LOW (ref 6.5–8.1)

## 2023-11-08 LAB — URINALYSIS, MICROSCOPIC (REFLEX)

## 2023-11-08 LAB — TSH: TSH: 1.386 u[IU]/mL (ref 0.350–4.500)

## 2023-11-08 LAB — LACTIC ACID, PLASMA
Lactic Acid, Venous: 3.3 mmol/L (ref 0.5–1.9)
Lactic Acid, Venous: 1.5 mmol/L (ref 0.5–1.9)

## 2023-11-08 LAB — LIPASE, BLOOD: Lipase: 42 U/L (ref 11–51)

## 2023-11-08 LAB — PREALBUMIN: Prealbumin: 18 mg/dL (ref 18–38)

## 2023-11-08 LAB — CBG MONITORING, ED: Glucose-Capillary: 130 mg/dL — ABNORMAL HIGH (ref 70–99)

## 2023-11-08 MED ORDER — SODIUM CHLORIDE 0.9 % IV SOLN
1.0000 g | INTRAVENOUS | Status: AC
  Administered 2023-11-08 – 2023-11-13 (×6): 1 g via INTRAVENOUS
  Filled 2023-11-08 (×6): qty 10

## 2023-11-08 MED ORDER — RISAQUAD PO CAPS
1.0000 | ORAL_CAPSULE | Freq: Two times a day (BID) | ORAL | Status: DC
  Administered 2023-11-08 – 2023-11-13 (×9): 1 via ORAL
  Filled 2023-11-08 (×11): qty 1

## 2023-11-08 MED ORDER — POLYETHYLENE GLYCOL 3350 17 G PO PACK: 17.0000 g | PACK | ORAL | Status: DC

## 2023-11-08 MED ORDER — AMLODIPINE BESYLATE 5 MG PO TABS
2.5000 mg | ORAL_TABLET | Freq: Every day | ORAL | Status: DC
  Administered 2023-11-09 – 2023-11-10 (×2): 2.5 mg via ORAL
  Filled 2023-11-08 (×2): qty 1

## 2023-11-08 MED ORDER — FLUCONAZOLE 100MG IVPB
100.0000 mg | INTRAVENOUS | Status: DC
  Administered 2023-11-08 – 2023-11-12 (×5): 100 mg via INTRAVENOUS
  Filled 2023-11-08 (×13): qty 50

## 2023-11-08 MED ORDER — ACETAMINOPHEN 325 MG PO TABS: 650.0000 mg | ORAL_TABLET | Freq: Four times a day (QID) | ORAL | Status: DC | PRN

## 2023-11-08 MED ORDER — ONDANSETRON HCL 4 MG/2ML IJ SOLN: 4.0000 mg | Freq: Four times a day (QID) | INTRAMUSCULAR | Status: DC | PRN

## 2023-11-08 MED ORDER — SENNA 8.6 MG PO TABS
17.2000 mg | ORAL_TABLET | Freq: Every evening | ORAL | Status: DC
  Administered 2023-11-08: 17.2 mg via ORAL
  Filled 2023-11-08 (×2): qty 2

## 2023-11-08 MED ORDER — ALBUTEROL SULFATE (2.5 MG/3ML) 0.083% IN NEBU
2.5000 mg | INHALATION_SOLUTION | Freq: Four times a day (QID) | RESPIRATORY_TRACT | Status: DC | PRN
Start: 2023-11-08 — End: 2023-11-13

## 2023-11-08 MED ORDER — PIPERACILLIN-TAZOBACTAM 3.375 G IVPB 30 MIN
3.3750 g | Freq: Once | INTRAVENOUS | Status: AC
  Administered 2023-11-08: 3.375 g via INTRAVENOUS
  Filled 2023-11-08: qty 50

## 2023-11-08 MED ORDER — ACETAMINOPHEN 650 MG RE SUPP: 650.0000 mg | Freq: Four times a day (QID) | RECTAL | Status: DC | PRN

## 2023-11-08 MED ORDER — FLUCONAZOLE 100 MG PO TABS
100.0000 mg | ORAL_TABLET | ORAL | Status: DC
  Filled 2023-11-08: qty 1

## 2023-11-08 MED ORDER — LOSARTAN POTASSIUM 25 MG PO TABS
25.0000 mg | ORAL_TABLET | Freq: Every day | ORAL | Status: DC
  Administered 2023-11-09 – 2023-11-13 (×5): 25 mg via ORAL
  Filled 2023-11-08 (×5): qty 1

## 2023-11-08 MED ORDER — IOHEXOL 350 MG/ML SOLN
75.0000 mL | Freq: Once | INTRAVENOUS | Status: AC | PRN
  Administered 2023-11-08: 75 mL via INTRAVENOUS

## 2023-11-08 MED ORDER — SODIUM CHLORIDE 0.9% FLUSH
3.0000 mL | Freq: Two times a day (BID) | INTRAVENOUS | Status: DC
  Administered 2023-11-08 – 2023-11-13 (×7): 3 mL via INTRAVENOUS

## 2023-11-08 MED ORDER — ONDANSETRON HCL 4 MG PO TABS: 4.0000 mg | ORAL_TABLET | Freq: Four times a day (QID) | ORAL | Status: DC | PRN

## 2023-11-08 MED ORDER — ENOXAPARIN SODIUM 40 MG/0.4ML IJ SOSY
40.0000 mg | PREFILLED_SYRINGE | INTRAMUSCULAR | Status: DC
  Administered 2023-11-08 – 2023-11-13 (×6): 40 mg via SUBCUTANEOUS
  Filled 2023-11-08 (×6): qty 0.4

## 2023-11-08 MED ORDER — SODIUM CHLORIDE 0.9 % IV BOLUS
1000.0000 mL | Freq: Once | INTRAVENOUS | Status: AC
  Administered 2023-11-08: 1000 mL via INTRAVENOUS

## 2023-11-08 MED ORDER — SODIUM CHLORIDE 0.9 % IV SOLN: INTRAVENOUS | Status: DC

## 2023-11-08 MED ORDER — PANTOPRAZOLE SODIUM 20 MG PO TBEC
20.0000 mg | DELAYED_RELEASE_TABLET | Freq: Two times a day (BID) | ORAL | Status: DC
  Administered 2023-11-08 – 2023-11-13 (×9): 20 mg via ORAL
  Filled 2023-11-08 (×10): qty 1

## 2023-11-08 MED ORDER — HYDRALAZINE HCL 10 MG PO TABS
10.0000 mg | ORAL_TABLET | Freq: Three times a day (TID) | ORAL | Status: DC | PRN
  Administered 2023-11-08 – 2023-11-11 (×2): 10 mg via ORAL
  Filled 2023-11-08 (×2): qty 1

## 2023-11-08 NOTE — Progress Notes (Addendum)
Pharmacy Antibiotic Note  Cheryl Monroe is a 89 y.o. female for which pharmacy has been consulted for  fluconazole  dosing for UTI.  Estimated Creatinine Clearance: 44.9 mL/min (by C-G formula based on SCr of 0.85 mg/dL). WBC 13.1; LA 1.5; T 99.7; HR 59; RR 31  Plan: Fluconazole 100mg  IV q24h (Change to PO once able to take by mouth) Ceftriaxone per MD Monitor WBC, fever, renal function, cultures De-escalate when able     Temp (24hrs), Avg:98.4 F (36.9 C), Min:97.6 F (36.4 C), Max:99.7 F (37.6 C)  Recent Labs  Lab 11/07/23 2052 11/08/23 0422 11/08/23 0610  WBC 13.1*  --   --   CREATININE 0.85  --   --   LATICACIDVEN  --  3.3* 1.5    Estimated Creatinine Clearance: 44.9 mL/min (by C-G formula based on SCr of 0.85 mg/dL).    Allergies  Allergen Reactions   Sulfa Antibiotics Rash   Microbiology results: Pending  Thank you for allowing pharmacy to be a part of this patient's care.  Delmar Landau, PharmD, BCPS 11/08/2023 12:01 PM ED Clinical Pharmacist -  [phone]

## 2023-11-08 NOTE — ED Notes (Signed)
Pt resist and refused blood draw,  nurse aware.  KM

## 2023-11-08 NOTE — ED Notes (Signed)
Left a VM with son [name]

## 2023-11-08 NOTE — ED Notes (Signed)
Patient unable to give stool sample.

## 2023-11-08 NOTE — Progress Notes (Signed)
ED Pharmacy Antibiotic Sign Off An antibiotic consult was received from an ED provider for Zosyn per pharmacy dosing for urosepsis. A chart review was completed to assess appropriateness.   The following one time order(s) were placed:   -Zosyn 3.375gm IV x1  Further antibiotic and/or antibiotic pharmacy consults should be ordered by the admitting provider if indicated.   Thank you for allowing pharmacy to be a part of this patient's care.   Arabella Merles, [facility]  Clinical Pharmacist 11/08/23 3:46 AM

## 2023-11-08 NOTE — ED Notes (Signed)
Patient returned from CT

## 2023-11-08 NOTE — ED Notes (Signed)
Sent two requests to pharmacy for IV fluconazole, still waiting on delivery

## 2023-11-08 NOTE — ED Notes (Signed)
Patient transported to CT 

## 2023-11-08 NOTE — ED Notes (Signed)
Per report from previous nurse, pt not able to take PO meds at this time

## 2023-11-08 NOTE — H&P (Signed)
History and Physical    Patient: Cheryl Monroe TKZ:[id number] DOB: 07/09/1935 DOA: 11/07/2023 DOS: the patient was seen and examined on 11/08/2023 PCP: Mahlon Gammon, MD  Patient coming from: Transfer from [facility] via EMS  Chief Complaint:  Chief Complaint  Patient presents with   Diarrhea   HPI: Cheryl Monroe is a 89 y.o. female with medical history significant of hypertension, Alzheimer's dementia, aphasia, neurogenic bladder, recurrent UTIs, and osteoarthritis presented for evaluation for diarrhea and reports of decreased appetite.  Patient is not able to provide any significant information therefore history is obtained from the patient's son who is a physician over the phone.  [place] of [place] and reports that the family had requested that she be transferred to the hospital for further evaluation.  He reports that she did not have issues with diarrhea but had been more lethargic and not her normal self for which they were concerned that she had developed a urinary tract infection.  He makes note that she sometimes retains urine especially when she has a urinary tract infection, and may need In-N-Out cath.  The patient's son request that no Foley catheter be placed.   In the emergency department patient was noted to have temperature of 99.7 F, respirations 15-26, blood pressures 119/74-164/66, and O2 saturation maintained on room air.  Labs significant for WBC 13.1, lactic acid 3.3->1.5, glucose 181, and lipase 42.  Urinalysis noted small leukocytes, many bacteria present, he is present, 6-10 squamous epithelial cells and 6-10 WBCs.  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  Blood cultures have been obtained.  C. difficile and GI panel was ordered.  Patient had been given 1 L normal saline IV fluids and started on Zosyn.  Review of Systems: unable to review all systems due to the inability of the patient to answer questions. Past Medical History:  Diagnosis Date   Alzheimer disease (HCC)    Balance problem 07/19/2020   Constipation 07/19/2020   Dementia without behavioral disturbance (HCC) 07/19/2020   MMSE 21/30 07/21/20   Fall    Osteoarthritis    Pyelonephritis    Spinal stenosis 07/19/2020   Weakness of left lower extremity 07/19/2020   No past surgical history on file. Social History:  reports that she has never smoked. She has never used smokeless tobacco. She reports that she does not currently use alcohol. She reports that she does not currently use drugs.  Allergies  Allergen Reactions   Sulfa Antibiotics Rash    Rash to trunk, legs, neck, scalp, and arms    Family History  Problem Relation Age of Onset   ALS Mother     Prior to Admission medications   Medication Sig Start Date End Date Taking? Authorizing Provider  Acetaminophen (TYLENOL DISSOLVE PACKS) 500 MG PACK Take 2 packets by mouth in the morning.    [provider]  amLODipine (NORVASC) 5 MG tablet Take 0.5 tablets (2.5 mg total) by mouth daily. 09/08/23 09/07/24  Fletcher Anon, NP  Cranberry (THERACRAN PO) Take 250 mg by mouth 2 (two) times daily.    [provider]  hydrALAZINE (APRESOLINE) 10 MG tablet Take 10 mg by mouth every 8 (eight) hours  as needed (SBP > 180).    [provider]  losartan (COZAAR) 25 MG tablet Take 25 mg by mouth daily.    [provider]  nitrofurantoin (MACRODANTIN) 50 MG capsule Take 50 mg by mouth 2 (two) times daily.    [provider]  pantoprazole (PROTONIX) 20 MG tablet Take 1 tablet (20 mg total) by mouth 2 (two) times daily. 06/25/23   Fargo,  Amy E, NP  polyethylene glycol (MIRALAX / GLYCOLAX) 17 g packet Take 17 g by mouth every other day.    [provider]  senna (SENOKOT) 8.6 MG TABS tablet Take 17.2 mg by mouth at bedtime.    [provider]    Physical Exam: Vitals:   11/08/23 0015 11/08/23 0422 11/08/23 0424 11/08/23 0545  BP: (!) 146/79  (!) 130/95 (!) 164/66  Pulse: 79  77 69  Resp: 19  15 (!) 26  Temp:  99.7 F (37.6 C)    TempSrc:  Axillary    SpO2: 97%  95% 96%   Constitutional: Elderly female who is lethargic and currently not following commands but appears able to protect airway. Eyes: PERRL, lids and conjunctivae normal ENMT: Mucous membranes are moist.  Normal dentition.  Neck: normal, supple, no JVD appreciated Respiratory: clear to auscultation bilaterally, no wheezing, no crackles. Normal respiratory effort. No accessory muscle use.  Cardiovascular: Regular rate and rhythm, no murmurs / rubs / gallops.  Trace lower extremity edema. 2+ pedal pulses. No carotid bruits.  Abdomen: Distended abdomen without significant tenderness palpation appreciated.  Bowel sounds present. Musculoskeletal: no clubbing / cyanosis. No joint deformity upper and lower extremities. Good ROM, no contractures. Normal muscle tone.  Skin: no rashes, lesions, ulcers. No induration Neurologic: CN 2-12 grossly intact.  Appears able to move all extremities to noxious stimuli.  Has known baseline aphasia Psychiatric: Lethargic  Data Reviewed:  Reviewed labs, imaging, and pertinent records as documented.  Assessment and Plan:   Suspected sepsis secondary to urinary tract infection Present on admission.  Patient noted to have frequent urinary tract infections for which she is on prophylactic Macrobid at baseline.  She presented after family noted her to be more lethargic for which they were concern for possible urinary tract infection.  In the ED patient was noted to be tachypneic with white blood cell count elevated  at 13.1 meeting SIRS criteria.  Initial lactic acid was 3.3.  After IV fluids repeat check within normal limits.  Urinalysis significant for small leukocytes, many bacteria present, yeast present, 6-10 squamous epithelial cells, and 6-10 WBCs.  Patient had been started on empiric antibiotics of Zosyn.  CT scan of abdomen pelvis noted some possible UPJ obstruction for which no clear stone was reported.  Last 2 available urine cultures were positive for yeast last year.  Review of records on care everywhere note prior cultures positive for Klebsiella and Pseudomonas back in 2022. -Admit to a telemetry bed -Follow-up blood cultures -Check urine culture -Change antibiotics to Rocephin.  Consider broadening antibiotics based off cultures -Added fluconazole IV per pharmacy -Resume nitrofurantoin once urinary tract infection treated  Bladder retention Patient son makes note patient sometimes returns urine especially if she has a urinary tract infection. -Bladder scan every 6 hours -In-N-Out cath greater than 150 mL of urine present.  Essential hypertension Blood pressures initially elevated up to 171/67. -Continue home blood pressure regimen of amlodipine and losartan with as needed hydralazine  Dementia with expressive aphasia Patient is usually more interactive and  has been more lethargic.  Thought secondary to acute urinary tract infection. -Delirium precautions -Aspiration precautions with elevation head of the bed -Bed alarm on  Generalized weakness with impaired mobility -PT/OT to evaluate and treat  Thoracic aortic aneurysm Patient noted to have 4 cm dilation of the ascending thoracic aorta on CT angiogram that appears similar from prior imaging done 02/04/2023. -Recommended to have annual imaging with CTA or MRA recommended for monitoring  Hypoalbuminemia Acute.  Albumin noted to be mildly low at 3.2. -Check prealbumin -Dietitian consulted for nutrition recommendations on  request  GERD -Continue Protonix  Reports of diarrhea Patient makes note that patient had not come to the hospital for diarrhea. -Probiotic added  DVT prophylaxis: Lovenox  Advance Care Planning:   Code Status: Do not attempt resuscitation (DNR) PRE-ARREST INTERVENTIONS DESIRED   Consults: None  Family Communication: Patient's son updated over the phone  Severity of Illness: The appropriate patient status for this patient is INPATIENT. Inpatient status is judged to be reasonable and necessary in order to provide the required intensity of service to ensure the patient's safety. The patient's presenting symptoms, physical exam findings, and initial radiographic and laboratory data in the context of their chronic comorbidities is felt to place them at high risk for further clinical deterioration. Furthermore, it is not anticipated that the patient will be medically stable for discharge from the hospital within 2 midnights of admission.   * I certify that at the point of admission it is my clinical judgment that the patient will require inpatient hospital care spanning beyond 2 midnights from the point of admission due to high intensity of service, high risk for further deterioration and high frequency of surveillance required.*  Author: Clydie Braun, MD 11/08/2023 7:12 AM  For on call review [email or web address].

## 2023-11-09 ENCOUNTER — Encounter (HOSPITAL_COMMUNITY): Payer: Self-pay | Admitting: Internal Medicine

## 2023-11-09 DIAGNOSIS — R339 Retention of urine, unspecified: Secondary | ICD-10-CM | POA: Diagnosis not present

## 2023-11-09 DIAGNOSIS — F0390 Unspecified dementia without behavioral disturbance: Secondary | ICD-10-CM | POA: Diagnosis not present

## 2023-11-09 DIAGNOSIS — A419 Sepsis, unspecified organism: Secondary | ICD-10-CM | POA: Diagnosis not present

## 2023-11-09 DIAGNOSIS — I10 Essential (primary) hypertension: Secondary | ICD-10-CM | POA: Diagnosis not present

## 2023-11-09 LAB — CBC
HCT: 41.8 % (ref 36.0–46.0)
Hemoglobin: 13.4 g/dL (ref 12.0–15.0)
MCH: 28 pg (ref 26.0–34.0)
MCHC: 32.1 g/dL (ref 30.0–36.0)
MCV: 87.3 fL (ref 80.0–100.0)
Platelets: 178 10*3/uL (ref 150–400)
RBC: 4.79 MIL/uL (ref 3.87–5.11)
RDW: 13.6 % (ref 11.5–15.5)
WBC: 6.4 10*3/uL (ref 4.0–10.5)
nRBC: 0 % (ref 0.0–0.2)

## 2023-11-09 LAB — URINE CULTURE

## 2023-11-09 LAB — BASIC METABOLIC PANEL
Anion gap: 10 (ref 5–15)
BUN: 10 mg/dL (ref 8–23)
CO2: 26 mmol/L (ref 22–32)
Calcium: 8.8 mg/dL — ABNORMAL LOW (ref 8.9–10.3)
Chloride: 106 mmol/L (ref 98–111)
Creatinine, Ser: 0.54 mg/dL (ref 0.44–1.00)
GFR, Estimated: >60 mL/min (ref >60)
Glucose, Bld: 113 mg/dL — ABNORMAL HIGH (ref 70–99)
Potassium: 3.5 mmol/L (ref 3.5–5.1)
Sodium: 142 mmol/L (ref 135–145)

## 2023-11-09 MED ORDER — POLYETHYLENE GLYCOL 3350 17 G PO PACK
17.0000 g | PACK | Freq: Every day | ORAL | Status: DC | PRN
  Administered 2023-11-11 – 2023-11-12 (×2): 17 g via ORAL
  Filled 2023-11-09 (×2): qty 1

## 2023-11-09 MED ORDER — SENNA 8.6 MG PO TABS
17.2000 mg | ORAL_TABLET | Freq: Every evening | ORAL | Status: DC | PRN
  Administered 2023-11-11 – 2023-11-12 (×2): 17.2 mg via ORAL
  Filled 2023-11-09 (×2): qty 2

## 2023-11-09 NOTE — Evaluation (Addendum)
Occupational Therapy Evaluation Patient Details Name: Cheryl Monroe MRN: [id number] DOB: 1935/03/15 Today's Date: 11/09/2023   History of Present Illness Cheryl Monroe is a 89 y.o. female who presented for evaluation for diarrhea and reports of decreased appetite; workup revealing UTI; with medical history significant of hypertension, Alzheimer's dementia, aphasia, neurogenic bladder, recurrent UTIs, and osteoarthritis   Clinical Impression   Pt from SNF, was having assist for all ADLs, at times able to feed self with encouragement. Pt utilizes lift (?sara plus? And hoyer lift) for transfers at baseline. Pt needing up to max A at time of evaluation for ADLs, and max +2 to sit EOB for self -feeding task. Pt able to sit EOB x10 min, but needs posterior support to prevent posterior lean at EOB. Pt presenting with impairments listed below, will follow acutely. Recommend return to SNF at d/c.         If plan is discharge home, recommend the following: Two people to help with walking and/or transfers;A lot of help with bathing/dressing/bathroom;Assistance with cooking/housework;Assistance with feeding;Direct supervision/assist for financial management;Direct supervision/assist for medications management;Help with stairs or ramp for entrance;Assist for transportation    Functional Status Assessment  Patient has had a recent decline in their functional status and demonstrates the ability to make significant improvements in function in a reasonable and predictable amount of time.  Equipment Recommendations  Other (comment) (defer)    Recommendations for Other Services PT consult     Precautions / Restrictions Precautions Precautions: Fall Precaution Comments: 'Enteric prec Restrictions Weight Bearing Restrictions Per Provider Order: No      Mobility Bed Mobility Overal bed mobility: Needs Assistance Bed Mobility: Rolling, Sidelying to Sit, Sit to Supine Rolling: Max assist Sidelying  to sit: +2 for physical assistance, Max assist   Sit to supine: +2 for physical assistance, Total assist        Transfers                   General transfer comment: deferred, pt hoyer at baseline      Balance Overall balance assessment: Needs assistance   Sitting balance-Leahy Scale: Poor Sitting balance - Comments: Sat EOB to engage in ADL task of self-feeding; Needed constant trunk/back support to maintain sitting                                   ADL either performed or assessed with clinical judgement   ADL Overall ADL's : Needs assistance/impaired Eating/Feeding: Minimal assistance   Grooming: Minimal assistance   Upper Body Bathing: Moderate assistance   Lower Body Bathing: Maximal assistance   Upper Body Dressing : Moderate assistance   Lower Body Dressing: Maximal assistance   Toilet Transfer: Maximal assistance;+2 for physical assistance   Toileting- Clothing Manipulation and Hygiene: Maximal assistance       Functional mobility during ADLs: Maximal assistance;+2 for physical assistance       Vision   Vision Assessment?: No apparent visual deficits     Perception Perception: Not tested       Praxis Praxis: Not tested       Pertinent Vitals/Pain Pain Assessment Pain Assessment: Faces Pain Score: 0-No pain Pain Intervention(s): Monitored during session     Extremity/Trunk Assessment Upper Extremity Assessment Upper Extremity Assessment: Generalized weakness;Difficult to assess due to impaired cognition (appeared to have more difficulty lifting LUE against gravity than RUE... used RUE to self feed)  Lower Extremity Assessment Lower Extremity Assessment: Defer to PT evaluation       Communication Communication Communication: Difficulty communicating thoughts/reduced clarity of speech (soft spoken) Cueing Techniques: Verbal cues;Gestural cues;Tactile cues;Visual cues   Cognition Arousal: Alert Behavior During  Therapy: WFL for tasks assessed/performed Overall Cognitive Status: History of cognitive impairments - at baseline                                 General Comments: Likely at baseline; answers questions appropriately; noted pt using gestures and pointing at times, at times appearing labile, laughing and crying during session     General Comments  Pleasant and participating well    Exercises     Shoulder Instructions      Home Living Family/patient expects to be discharged to:: Skilled nursing facility                                 Additional Comments: From [facility]; likely from SNF level      Prior Functioning/Environment Prior Level of Function : Needs assist             Mobility Comments: Typically uses the standing assist lift for bed to wheelchair transfers (similar to sara+); occasionally needs to use the hoyer-type lift, typically wehn more fatigued at night ADLs Comments: Completely dependent with ADLs; can self-feed, often needs supervision and encouragement        OT Problem List: Decreased strength;Decreased range of motion;Decreased activity tolerance;Impaired balance (sitting and/or standing);Decreased cognition;Decreased safety awareness      OT Treatment/Interventions: Self-care/ADL training;Therapeutic exercise;Energy conservation;DME and/or AE instruction;Therapeutic activities;Visual/perceptual remediation/compensation;Patient/family education;Balance training    OT Goals(Current goals can be found in the care plan section) Acute Rehab OT Goals Patient Stated Goal: none stated OT Goal Formulation: With patient Time For Goal Achievement: 11/23/23 Potential to Achieve Goals: Good ADL Goals Pt Will Perform Eating: with supervision;sitting Pt Will Perform Grooming: with supervision;sitting Pt/caregiver will Perform Home Exercise Program: Both right and left upper extremity;With Supervision Additional ADL Goal #1: pt will  perform bed mobility mod A in prep for ADLs  OT Frequency: Min 1X/week    Co-evaluation PT/OT/SLP Co-Evaluation/Treatment: Yes Reason for Co-Treatment: Complexity of the patient's impairments (multi-system involvement);Necessary to address cognition/behavior during functional activity;For patient/therapist safety;To address functional/ADL transfers PT goals addressed during session: Mobility/safety with mobility OT goals addressed during session: ADL's and self-care      AM-PAC OT "6 Clicks" Daily Activity     Outcome Measure Help from another person eating meals?: A Little Help from another person taking care of personal grooming?: A Little Help from another person toileting, which includes using toliet, bedpan, or urinal?: A Lot Help from another person bathing (including washing, rinsing, drying)?: A Lot Help from another person to put on and taking off regular upper body clothing?: A Lot Help from another person to put on and taking off regular lower body clothing?: A Lot 6 Click Score: 14   End of Session Nurse Communication: Need for lift equipment;Mobility status  Activity Tolerance: Patient tolerated treatment well Patient left: in bed;with bed alarm set;with call bell/phone within reach  OT Visit Diagnosis: Unsteadiness on feet (R26.81);Other abnormalities of gait and mobility (R26.89);Muscle weakness (generalized) (M62.81)                Time: 7371-0626 OT Time Calculation (min): 26 min Charges:  OT General Charges $OT Visit: 1 Visit OT Evaluation $OT Eval Moderate Complexity: 1 Mod  Zellie Jenning K, OTD, OTR/L SecureChat Preferred Acute Rehab [phone]   Dalphine Handing 11/09/2023, 12:47 PM

## 2023-11-09 NOTE — Progress Notes (Addendum)
[facility]                                                                               Cheryl Monroe, is a 89 y.o. female, DOB - December 30, 1934, EAV:[id number] Admit date - 11/07/2023    Outpatient Primary MD for the patient is Mahlon Gammon, MD  LOS - 1  days    Brief summary   Cheryl Monroe is a 89 y.o. female with medical history significant of hypertension, Alzheimer's dementia, aphasia, neurogenic bladder, recurrent UTIs, and osteoarthritis presented for evaluation for diarrhea and reports of decreased appetite.  Patient is not able to provide any significant information. Most of the history available from son over the phone.  He reports that she did not have issues with diarrhea but had been more lethargic and not her normal self for which they were concerned that she had developed a urinary tract infection. He makes note that she sometimes retains urine especially when she has a urinary tract infection, and may need In-N-Out cath.  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 Blood cultures have been obtained. C. difficile and GI panel was ordered.    Assessment & Plan    Assessment and Plan:  Sepsis from UTI present on admission.  Follow up urine and blood cultures.  Continue with IV Ceftriaxone and Iv Diflucan.  Afebrile and wbc count normalized.  Lactic acid normalized.  Continue with IV fluids for another 24 hours.     Diarrhea Appears to have resolved.  No BM last night and this am.    Hypertension BP parameters have been elevated on admission. Slowly improving after restarting home meds.  Pt on  Norvasc 2.5 mg daily, along with cozaar.    Alzheimer's dementia with aphasia/ dysarthria She appears to be back to baseline.     Neurogenic bladder.  Check post void bladder scan to evaluate for retention.  Urine cultures are pending.  Pt on nitrofurantoin at home.     Dilation of the ascending thoracic aorta 4.0 cm in diameter Monitor outpatient with PCP/ vascular surgery.    Therapy evaluations ordered.    Estimated body mass index is 29.96 kg/m as calculated from the following:   Height as of this encounter: 5\' 2"  (1.575 m).   Weight as of this encounter: 74.3 kg.  Code Status: DNR may intubate and use advanced airway interventions.  DVT Prophylaxis:  enoxaparin (LOVENOX) injection 40 mg Start: 11/08/23 1000   Level of Care: Level of care: Telemetry Medical Family Communication: none at bedside.   Disposition Plan:     Remains inpatient appropriate:  pending clinical improvement.  Awaiting for urine culture report.   Procedures:  None.   Consultants:   None.   Antimicrobials:   Anti-infectives (From admission, onward)    Start     Dose/Rate Route Frequency Ordered Stop   11/08/23 1300  fluconazole (DIFLUCAN) tablet 100 mg  Status:  Discontinued        100 mg Oral Every 24 hours 11/08/23 1204 11/08/23 1208   11/08/23 1300  fluconazole (DIFLUCAN) IVPB 100 mg        100 mg 50 mL/hr over 60 Minutes Intravenous Every 24 hours 11/08/23 1208     11/08/23 1145  cefTRIAXone (ROCEPHIN) 1 g in sodium chloride 0.9 % 100 mL IVPB        1 g 200 mL/hr over 30 Minutes Intravenous Every 24 hours 11/08/23 1143     11/08/23 0415  piperacillin-tazobactam (ZOSYN) IVPB 3.375 g        3.375 g 100 mL/hr over 30 Minutes Intravenous  Once 11/08/23 0410 11/08/23 0504        Medications  Scheduled Meds:  acidophilus  1 capsule Oral BID   amLODipine  2.5 mg Oral Daily   enoxaparin (LOVENOX) injection  40 mg Subcutaneous Q24H   losartan  25 mg Oral Daily   pantoprazole  20  mg Oral BID   polyethylene glycol  17 g Oral QODAY   senna  17.2 mg Oral QPM   sodium chloride flush  3 mL Intravenous Q12H   Continuous Infusions:  sodium chloride 75 mL/hr at 11/08/23 1415   cefTRIAXone (ROCEPHIN)  IV Stopped (11/08/23 1415)   fluconazole (DIFLUCAN) IV Stopped (11/08/23 1751)   PRN Meds:.acetaminophen **OR** acetaminophen, albuterol, hydrALAZINE, ondansetron **OR** ondansetron (ZOFRAN) IV    Subjective:   Cheryl Monroe was seen and examined today.  Appears calm and comfortable.   Objective:   Vitals:   11/08/23 1836 11/08/23 1919 11/09/23 0532 11/09/23 1001  BP:  (!) 189/76 (!) 174/65 (!) 151/68  Pulse:  65 63   Resp:  (!) 22 18 20   Temp: 98.8 F (37.1 C) (!) 97.2 F (36.2 C) 97.6 F (36.4 C) 97.8 F (36.6 C)  TempSrc: Oral  Oral Oral  SpO2:  98% 96% 100%  Weight:  74.3 kg    Height:  5\' 2"  (1.575 m)      Intake/Output Summary (Last 24 hours) at 11/09/2023 1152 Last data filed at 11/09/2023 0500 Gross per 24 hour  Intake 1136.67 ml  Output 400 ml  Net 736.67 ml   Filed Weights   11/08/23 1919  Weight: 74.3 kg     Exam General exam: elderly woman, not in distress.  Respiratory system: Clear to auscultation. Respiratory effort normal. Cardiovascular system: S1 & S2 heard, RRR. No JVD,  Gastrointestinal system: Abdomen is nondistended, soft and nontender.  Central nervous system: Alert ,  aphasic.  Extremities: warm extremities.  Skin: No rashes,  Psychiatry: unable to assess, but she remains calm and pleasant.    Data Reviewed:  I have personally reviewed following labs and imaging studies   CBC Lab Results  Component Value Date   WBC 6.4 11/09/2023   RBC 4.79 11/09/2023   HGB 13.4 11/09/2023   HCT 41.8 11/09/2023   MCV 87.3 11/09/2023   MCH 28.0 11/09/2023   PLT 178 11/09/2023   MCHC 32.1 11/09/2023   RDW 13.6 11/09/2023   LYMPHSABS 1.2 11/07/2023   MONOABS 1.3 (H) 11/07/2023   EOSABS 0.2 11/07/2023   BASOSABS 0.1  11/07/2023     Last metabolic panel Lab Results  Component Value Date   NA 142 11/09/2023   K 3.5 11/09/2023   CL 106 11/09/2023   CO2 26 11/09/2023   BUN 10 11/09/2023   CREATININE 0.54 11/09/2023  GLUCOSE 113 (H) 11/09/2023   GFRNONAA >60 11/09/2023   CALCIUM 8.8 (L) 11/09/2023   PHOS 3.4 06/03/2023   PROT 6.3 (L) 11/08/2023   ALBUMIN 3.2 (L) 11/08/2023   BILITOT 0.3 11/08/2023   ALKPHOS 87 11/08/2023   AST 29 11/08/2023   ALT 18 11/08/2023   ANIONGAP 10 11/09/2023    CBG (last 3)  Recent Labs    11/08/23 0416  GLUCAP 130*      Coagulation Profile: No results for input(s): "INR", "PROTIME" in the last 168 hours.   Radiology Studies: CT ABDOMEN PELVIS W CONTRAST Result Date: 11/08/2023 CLINICAL DATA:  Bowel obstruction EXAM: CT ABDOMEN AND PELVIS WITH CONTRAST TECHNIQUE: Multidetector CT imaging of the abdomen and pelvis was performed using the standard protocol following bolus administration of intravenous contrast. RADIATION DOSE REDUCTION: This exam was performed according to the departmental dose-optimization program which includes automated exposure control, adjustment of the mA and/or kV according to patient size and/or use of iterative reconstruction technique. CONTRAST:  75mL OMNIPAQUE IOHEXOL 350 MG/ML SOLN COMPARISON:  None Available. FINDINGS: Lower chest: Extensive multi-vessel coronary artery calcification. Global cardiac size within normal limits. There is dilation of the ascending thoracic aorta which measures at least 4.0 cm in diameter and is incompletely visualized on this examination. This is similar, however, to prior examination 02/04/2023. Bibasilar dependent atelectasis. Hepatobiliary: Evaluation of the upper abdominal solid organs is limited by respiratory motion artifact. Allowing for this, the liver and gallbladder are unremarkable. No intra or extrahepatic biliary ductal dilation. Pancreas: Unremarkable Spleen: Unremarkable Adrenals/Urinary Tract:  The adrenal glands are unremarkable. Mild asymmetric right renal cortical atrophy. The kidneys are normal in position. Mild asymmetric dilation of the right renal pelvis with decompression of the right ureter suggests a mild UPJ obstruction. No intrarenal or ureteral calculi. No hydronephrosis on the left. The bladder is unremarkable. Stomach/Bowel: Stomach is within normal limits. Appendix appears normal. No evidence of bowel wall thickening, distention, or inflammatory changes. Vascular/Lymphatic: There is dilation of the left gonadal vein with bilateral adnexal varices identified suggesting changes of ovarian vein reflux and pelvic venous insufficiency. Moderate atherosclerotic calcification within the abdominal aorta. No aortic aneurysm. No pathologic adenopathy within the abdomen and pelvis. Reproductive: Pessary in place. Pelvic organs are otherwise unremarkable. Other: Diastasis of the rectus abdominus musculature noted. No abdominopelvic ascites Musculoskeletal: The osseous structures are age-appropriate. No acute bone abnormality IMPRESSION: 1. No acute intra-abdominal pathology identified. No definite radiographic explanation for the patient's reported symptoms. 2. Extensive multi-vessel coronary artery calcification. 3. Dilation of the ascending thoracic aorta which measures at least 4.0 cm in diameter and is incompletely visualized on this examination. This is similar, however, to prior examination 02/04/2023. Follow-up recommendations are as outlined on prior examination. 4. 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 5. Dilation of the left gonadal vein with bilateral adnexal varices identified suggesting changes of ovarian vein reflux and pelvic venous insufficiency. Aortic Atherosclerosis (ICD10-I70.0). Electronically Signed   By: Helyn Numbers M.D.   On: 11/08/2023 01:21       Kathlen Mody M.D. [facility] 11/09/2023, 11:52 AM  Available via Epic secure  chat 7am-7pm After 7 pm, please refer to night coverage provider listed on amion.

## 2023-11-09 NOTE — Evaluation (Signed)
Physical Therapy Evaluation Patient Details Name: Cheryl Monroe MRN: [id number] DOB: 1935/08/21 Today's Date: 11/09/2023  History of Present Illness  Cheryl Monroe is a 89 y.o. female who presented for evaluation for diarrhea and reports of decreased appetite; workup revealing UTI; with medical history significant of hypertension, Alzheimer's dementia, aphasia, neurogenic bladder, recurrent UTIs, and osteoarthritis  Clinical Impression   Pt admitted with above diagnosis. Lives [facility] (likely at SNF level of care); Prior to admission, pt was able to be OOB in her wheelchair most of the day; Staff assists with OOB transfers with mechanical standing assist lift, when very tired, staff uses hoyer-type mechanical lift (typically at night); Completely dependent for bathing, toileting, dressing; Can self-feed with encouragement and supervision; Presents to PT with decr activity tolerance, generalized weakness;  Today, pt needed 2 person Max assist to come to unsupported sitting EOB, needed constant mod max assist and support to maintain sitting; Will plan for a few more PT sessions for OOB to recliner with lift while here at hospital; Pt currently with functional limitations due to the deficits listed below (see PT Problem List). Pt will benefit from skilled PT to increase their independence and safety with mobility to allow discharge to the venue listed below.           If plan is discharge home, recommend the following:     Can travel by private vehicle   No    Equipment Recommendations None recommended by PT  Recommendations for Other Services       Functional Status Assessment Patient has had a recent decline in their functional status and/or demonstrates limited ability to make significant improvements in function in a reasonable and predictable amount of time     Precautions / Restrictions Precautions Precautions: Fall Precaution Comments: 'Enteric prec Restrictions Weight  Bearing Restrictions Per Provider Order: No      Mobility  Bed Mobility Overal bed mobility: Needs Assistance Bed Mobility: Rolling, Sidelying to Sit, Sit to Supine Rolling: Max assist Sidelying to sit: +2 for physical assistance, Max assist   Sit to supine: +2 for physical assistance, Total assist   General bed mobility comments: Max assist of 2 to sit up to EOB and to lay back down    Transfers                        Ambulation/Gait                  Stairs            Wheelchair Mobility     Tilt Bed    Modified Rankin (Stroke Patients Only)       Balance Overall balance assessment: Needs assistance   Sitting balance-Leahy Scale: Poor Sitting balance - Comments: Sat EOB to engage in ADL task of self-feeding; Needed constant trunk/back support to maintain sitting                                     Pertinent Vitals/Pain Pain Assessment Pain Assessment: Faces Faces Pain Scale: No hurt Pain Intervention(s): Monitored during session    Home Living Family/patient expects to be discharged to:: Skilled nursing facility                   Additional Comments: From [facility]; likely from SNF level    Prior Function Prior Level of Function : Needs assist  Mobility Comments: Typically uses the standing assist lift for bed to wheelchair transfers (similar to sara+); occasionally needs to use the hoyer-type lift, typically wehn more fatigued at night ADLs Comments: Completely dependent with ADLs; can self-feed, often needs supervision and encouragement     Extremity/Trunk Assessment   Upper Extremity Assessment Upper Extremity Assessment: Defer to OT evaluation    Lower Extremity Assessment Lower Extremity Assessment: Generalized weakness;Difficult to assess due to impaired cognition (limited effort to move LEs noted)       Communication   Communication Communication: Difficulty communicating  thoughts/reduced clarity of speech Cueing Techniques: Verbal cues;Gestural cues;Tactile cues;Visual cues  Cognition Arousal: Alert Behavior During Therapy: WFL for tasks assessed/performed Overall Cognitive Status: History of cognitive impairments - at baseline                                 General Comments: Likely at baseline; answers questions appropriately; noted pt using gestures and pointing at times        General Comments General comments (skin integrity, edema, etc.): Pleasant and participating well    Exercises     Assessment/Plan    PT Assessment Patient needs continued PT services  PT Problem List Decreased strength;Decreased range of motion;Decreased activity tolerance;Decreased balance;Decreased mobility;Decreased coordination;Decreased cognition;Decreased knowledge of use of DME;Decreased safety awareness;Decreased knowledge of precautions;Cardiopulmonary status limiting activity;Impaired sensation       PT Treatment Interventions Functional mobility training;Therapeutic activities;Therapeutic exercise;Balance training;Neuromuscular re-education;Cognitive remediation;Patient/family education;Wheelchair mobility training    PT Goals (Current goals can be found in the Care Plan section)  Acute Rehab PT Goals Patient Stated Goal: Agreed to eat some oatmeal sitting up PT Goal Formulation: Patient unable to participate in goal setting Time For Goal Achievement: 11/23/23 Potential to Achieve Goals: Fair    Frequency Min 1X/week     Co-evaluation PT/OT/SLP Co-Evaluation/Treatment: Yes Reason for Co-Treatment: Complexity of the patient's impairments (multi-system involvement);Necessary to address cognition/behavior during functional activity;For patient/therapist safety;To address functional/ADL transfers PT goals addressed during session: Mobility/safety with mobility         AM-PAC PT "6 Clicks" Mobility  Outcome Measure Help needed turning from  your back to your side while in a flat bed without using bedrails?: A Lot Help needed moving from lying on your back to sitting on the side of a flat bed without using bedrails?: Total Help needed moving to and from a bed to a chair (including a wheelchair)?: Total Help needed standing up from a chair using your arms (e.g., wheelchair or bedside chair)?: Total Help needed to walk in hospital room?: Total Help needed climbing 3-5 steps with a railing? : Total 6 Click Score: 7    End of Session Equipment Utilized During Treatment:  (bed pads) Activity Tolerance: Patient tolerated treatment well Patient left: in bed;with call bell/phone within reach;with bed alarm set (bed in semi-chair position) Nurse Communication: Mobility status PT Visit Diagnosis: Other abnormalities of gait and mobility (R26.89);Muscle weakness (generalized) (M62.81)    Time: 1610-9604 PT Time Calculation (min) (ACUTE ONLY): 26 min   Charges:   PT Evaluation $PT Eval Moderate Complexity: 1 Mod   PT General Charges $$ ACUTE PT VISIT: 1 Visit         Van Clines, PT  Acute Rehabilitation Services Office [phone] Secure Chat welcomed   Levi Aland 11/09/2023, 12:15 PM

## 2023-11-09 NOTE — Plan of Care (Signed)

## 2023-11-10 ENCOUNTER — Other Ambulatory Visit: Payer: Self-pay

## 2023-11-10 DIAGNOSIS — N390 Urinary tract infection, site not specified: Secondary | ICD-10-CM | POA: Diagnosis not present

## 2023-11-10 DIAGNOSIS — A419 Sepsis, unspecified organism: Secondary | ICD-10-CM | POA: Diagnosis not present

## 2023-11-10 MED ORDER — AMLODIPINE BESYLATE 5 MG PO TABS
5.0000 mg | ORAL_TABLET | Freq: Every day | ORAL | Status: DC
  Administered 2023-11-11 – 2023-11-12 (×2): 5 mg via ORAL
  Filled 2023-11-10 (×2): qty 1

## 2023-11-10 MED ORDER — SENNA 8.6 MG PO TABS: 1.0000 | ORAL_TABLET | Freq: Every day | ORAL | Status: DC

## 2023-11-10 MED ORDER — ENSURE ENLIVE PO LIQD
237.0000 mL | Freq: Two times a day (BID) | ORAL | Status: DC
Start: 2023-11-10 — End: 2023-11-13
  Administered 2023-11-10 – 2023-11-13 (×6): 237 mL via ORAL

## 2023-11-10 NOTE — Progress Notes (Addendum)
PT with very poor oral intake today. She refused breakfast, consumed ~ 10% of lunch trying to focus on protein intake. PT has few bites of chicken and green beans for dinner. She did consume ~ 50% of chocolate ensure during lunch. She replies softly "I'm not hungry". I tried to reinforce that she may get weaker without nutrition.

## 2023-11-10 NOTE — Progress Notes (Signed)
Tried to finish admission but unable due to cognition

## 2023-11-10 NOTE — Progress Notes (Signed)
PROGRESS NOTE    Cheryl Monroe  MVH:[id number] DOB: Mar 11, 1935 DOA: 11/07/2023 PCP: Mahlon Gammon, MD   Brief Narrative:  This 89 yrs old female with medical history significant of hypertension, Alzheimer's dementia, aphasia, neurogenic bladder, recurrent UTIs, and osteoarthritis presented for evaluation for diarrhea and reports of decreased appetite.  Patient is not able to provide any significant information. Most of the history available from son over the phone.  He reports that she did not have issues with diarrhea but had been more lethargic and not her normal self for which they were concerned that she had developed a urinary tract infection. He makes note that she sometimes retains urine especially when she has a urinary tract infection, and may need In-N-Out cath.  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 Blood cultures have been obtained. C. difficile and GI panel was ordered.   Assessment & Plan:   Principal Problem:   Sepsis secondary to UTI [facility]) Active Problems:   Bladder retention   Essential hypertension   Dementia without behavioral disturbance (HCC)   Aphasia   Weakness   AAA (abdominal aortic aneurysm) (HCC)   Hypoalbuminemia   GERD (gastroesophageal reflux disease)  Sepsis sec. to UTI, POA: Blood cultures no growth so far, urine cultures contaminated. Continue with IV Ceftriaxone and IV Diflucan.  Afebrile and wbc count normalized.  Lactic acid normalized.  Continue IV fluids.   Diarrhea: Appears to have resolved.  Bowel movement since yesterday.   Hypertension: Blood Pressure remains  elevated. Continue Cozaar 25 mg daily. Increase Norvasc to 5 mg for better blood pressure control. Continue hydralazine as needed   Alzheimer's dementia with aphasia/ dysarthria: She appears to be back to baseline.    Neurogenic bladder: Check post void bladder scan to evaluate for retention.  Urine cultures contaminated. Pt on nitrofurantoin at home.    Dilation of the ascending thoracic aorta 4.0 cm in diameter Monitor outpatient with PCP/ vascular surgery.      PT and OT evaluation     Estimated body mass index is 29.96 kg/m as calculated from the following:   Height as of this encounter: 5\' 2"  (1.575 m).   Weight as of this encounter: 74.3 kg.   DVT prophylaxis: Lovenox Code Status: DNR Family Communication: No family at bed side Disposition Plan:    Status is: Inpatient Remains inpatient appropriate because: Clinical improvement, awaiting urine culture report    Consultants:  None  Procedures: None  Antimicrobials:  Anti-infectives (From admission, onward)    Start     Dose/Rate Route Frequency Ordered Stop   11/08/23 1300  fluconazole (DIFLUCAN) tablet 100 mg  Status:  Discontinued        100 mg Oral Every 24 hours 11/08/23 1204 11/08/23 1208   11/08/23 1300  fluconazole (DIFLUCAN) IVPB 100 mg        100 mg 50 mL/hr over 60 Minutes Intravenous Every 24 hours 11/08/23 1208     11/08/23 1145  cefTRIAXone (ROCEPHIN) 1 g in sodium chloride 0.9 % 100 mL IVPB        1 g 200 mL/hr over 30 Minutes Intravenous Every 24 hours 11/08/23 1143     11/08/23 0415  piperacillin-tazobactam (ZOSYN)  IVPB 3.375 g        3.375 g 100 mL/hr over 30 Minutes Intravenous  Once 11/08/23 0410 11/08/23 0504      Subjective: Patient was seen and examined at bedside. Overnight events noted.   Patient appears to be at her baseline.  Denies any concerns.  Objective: Vitals:   11/09/23 1637 11/09/23 1901 11/10/23 0453 11/10/23 0907  BP: (!) 168/80 (!) 167/84 (!) 160/82 (!) 161/77   Pulse: 69 73 69 74  Resp: 18  18 (!) 21  Temp: 98.2 F (36.8 C) 97.8 F (36.6 C) 98.2 F (36.8 C) 98.4 F (36.9 C)  TempSrc: Oral Oral  Oral  SpO2: 98% 97%  98%  Weight:      Height:        Intake/Output Summary (Last 24 hours) at 11/10/2023 1317 Last data filed at 11/10/2023 9147 Gross per 24 hour  Intake 100 ml  Output 1900 ml  Net -1800 ml   Filed Weights   11/08/23 1919  Weight: 74.3 kg    Examination:  General exam: Appears calm and comfortable, deconditioned, not in any acute distress. Respiratory system: Clear to auscultation. Respiratory effort normal.  RR 15 Cardiovascular system: S1 & S2 heard, RRR. No JVD, murmurs, rubs, gallops or clicks. Gastrointestinal system: Abdomen is non distended, soft and non tender. Normal bowel sounds heard. Central nervous system: Alert and oriented x 1. No focal neurological deficits. Extremities: No edema, no cyanosis, no clubbing Skin: No rashes, lesions or ulcers Psychiatry: Judgement and insight appear normal. Mood & affect appropriate.     Data Reviewed: I have personally reviewed following labs and imaging studies  CBC: Recent Labs  Lab 11/07/23 2052 11/09/23 0739  WBC 13.1* 6.4  NEUTROABS 10.3*  --   HGB 13.9 13.4  HCT 43.9 41.8  MCV 88.5 87.3  PLT 191 178   Basic Metabolic Panel: Recent Labs  Lab 11/07/23 2052 11/09/23 0739  NA 139 142  K 4.2 3.5  CL 103 106  CO2 24 26  GLUCOSE 181* 113*  BUN 22 10  CREATININE 0.85 0.54  CALCIUM 9.2 8.8*   GFR: Estimated Creatinine Clearance: 47.7 mL/min (by C-G formula based on SCr of 0.54 mg/dL). Liver Function Tests: Recent Labs  Lab 11/08/23 0422  AST 29  ALT 18  ALKPHOS 87  BILITOT 0.3  PROT 6.3*  ALBUMIN 3.2*   Recent Labs  Lab 11/07/23 2052  LIPASE 42   No results for input(s): "AMMONIA" in the last 168 hours. Coagulation Profile: No results for input(s): "INR", "PROTIME" in the last 168 hours. Cardiac Enzymes: No results for input(s):  "CKTOTAL", "CKMB", "CKMBINDEX", "TROPONINI" in the last 168 hours. BNP (last 3 results) No results for input(s): "PROBNP" in the last 8760 hours. HbA1C: No results for input(s): "HGBA1C" in the last 72 hours. CBG: Recent Labs  Lab 11/08/23 0416  GLUCAP 130*   Lipid Profile: No results for input(s): "CHOL", "HDL", "LDLCALC", "TRIG", "CHOLHDL", "LDLDIRECT" in the last 72 hours. Thyroid Function Tests: Recent Labs    11/08/23 0422  TSH 1.386   Anemia Panel: No results for input(s): "VITAMINB12", "FOLATE", "FERRITIN", "TIBC", "IRON", "RETICCTPCT" in the last 72 hours. Sepsis Labs: Recent Labs  Lab 11/08/23 0422 11/08/23 0610  LATICACIDVEN 3.3* 1.5    Recent Results (from the past 240 hours)  Blood culture (routine x 2)     Status: None (Preliminary result)   Collection Time: 11/08/23  4:22 AM   Specimen: BLOOD RIGHT HAND  Result  Value Ref Range Status   Specimen Description BLOOD RIGHT HAND  Final   Special Requests   Final    BOTTLES DRAWN AEROBIC ONLY Blood Culture results may not be optimal due to an inadequate volume of blood received in culture bottles   Culture   Final    NO GROWTH 2 DAYS Performed at [facility], [address] [place], [place] [address]    Report Status PENDING  Incomplete  Blood culture (routine x 2)     Status: None (Preliminary result)   Collection Time: 11/08/23  4:28 AM   Specimen: BLOOD LEFT ARM  Result Value Ref Range Status   Specimen Description BLOOD LEFT ARM  Final   Special Requests   Final    BOTTLES DRAWN AEROBIC AND ANAEROBIC Blood Culture adequate volume   Culture   Final    NO GROWTH 2 DAYS Performed at [facility], [address], [place], [place] [address]    Report Status PENDING  Incomplete  Urine Culture     Status: Abnormal   Collection Time: 11/08/23 11:57 AM   Specimen: Urine, Clean Catch  Result Value Ref Range Status   Specimen Description URINE, CLEAN CATCH  Final   Special Requests   Final     NONE Performed at [facility], [address] [place], [place] [address]    Culture MULTIPLE SPECIES PRESENT, SUGGEST RECOLLECTION (A)  Final   Report Status 11/09/2023 FINAL  Final    Radiology Studies: No results found.  Scheduled Meds:  acidophilus  1 capsule Oral BID   [START ON 11/11/2023] amLODipine  5 mg Oral Daily   enoxaparin (LOVENOX) injection  40 mg Subcutaneous Q24H   feeding supplement  237 mL Oral BID BM   losartan  25 mg Oral Daily   pantoprazole  20 mg Oral BID   sodium chloride flush  3 mL Intravenous Q12H   Continuous Infusions:  sodium chloride 75 mL/hr at 11/10/23 0512   cefTRIAXone (ROCEPHIN)  IV 1 g (11/10/23 1148)   fluconazole (DIFLUCAN) IV 100 mg (11/09/23 1823)     LOS: 2 days    Time spent: 50 mins    Willeen Niece, MD [facility]   If 7PM-7AM, please contact night-coverage

## 2023-11-10 NOTE — Progress Notes (Signed)
Initial Nutrition Assessment  DOCUMENTATION CODES:   Not applicable  INTERVENTION:  Liberalize diet to promote PO intake Magic cup TID with meals, each supplement provides 290 kcal and 9 grams of protein cu   NUTRITION DIAGNOSIS:  Inadequate oral intake related to poor appetite as evidenced by per patient/family report.  GOAL:  Patient will meet greater than or equal to 90% of their needs  MONITOR:  Supplement acceptance, PO intake  REASON FOR ASSESSMENT:  Consult Assessment of nutrition requirement/status  ASSESSMENT:  Pt presenting to hospital due to poor appetite and diarrhea x1 day. PMH: dementia, HTN, orthostatic hypotension, aphasia, spinal stenosis, UTI, dysphagia, constipation.  She is a resident at [facility]. Family requesting work up at hospital versus her extended care facility as she has shown some increased agitation with staff members, which is not her baseline. She has hx of recurring UTIs.   Per facility RD, patient appetite at baseline is adequate. She is ordered D3, thin liquids and does have adaptive equipment ordered, but prefers not to use it.  PT/OT endorsing she does need assistance and encouragement at bedside, however is able to self-feed.   No BMs since admission. Diarrhea appears resolved. She is ordered Miralax, per her home med list, as well as senna.   Admit Weight: 74.3kg  UBW, per facility RD, between 159-164 lbs over last quarter. She is not ordered any supplements at baseline.  Facility RD endorses patient with adequate wt to frame with no significant wasting. No supplements at facility, as family and patient has been more concerned with her sodium level and preferred electrolyte beverages to nutrition supplements.     Labs: CBGs 113-181mg dL Lactic Acid 1.6-->1.0  Meds: pantoprazole, IV ABX, NaCl  Diet Order:   Diet Order             Diet heart healthy/carb modified Fluid consistency: Thin  Diet effective now              EDUCATION NEEDS:   Not appropriate for education at this time  Skin:  Skin Assessment: Reviewed RN Assessment   Last BM:  PTA  Height:  Ht Readings from Last 1 Encounters:  11/08/23 5\' 2"  (1.575 m)    Weight:  Wt Readings from Last 1 Encounters:  11/08/23 74.3 kg    Ideal Body Weight:  50 kg  BMI:  Body mass index is 29.96 kg/m.  Estimated Nutritional Needs:   Kcal:  1500-1700kcal  Protein:  65-75g  Fluid:  >1.5L/day   Myrtie Cruise MS, RD, LDN Registered Dietitian Clinical Nutrition RD Inpatient Contact Info in Amion

## 2023-11-11 DIAGNOSIS — N390 Urinary tract infection, site not specified: Secondary | ICD-10-CM | POA: Diagnosis not present

## 2023-11-11 DIAGNOSIS — A419 Sepsis, unspecified organism: Secondary | ICD-10-CM | POA: Diagnosis not present

## 2023-11-11 NOTE — TOC Initial Note (Signed)
Transition of Care [facility]) - Initial/Assessment Note    Patient Details  Name: Cheryl Monroe MRN: [id number] Date of Birth: 04-02-1935  Transition of Care [facility]) CM/SW Contact:    Lawerance Sabal, RN Phone Number: 11/11/2023, 12:01 PM  Clinical Narrative:                  Patient admitted from [facility] History of hypertension, Alzheimer's dementia, aphasia, neurogenic bladder, recurrent UTIs, and osteoarthritis   Sepsis 2/2 UTI Diarrhea, fluid volume deficit, poor PO  Patient may need higher level of care at DC.  Will also need to maintain adequate oral nutrition prior to discharge.    Expected Discharge Plan: Skilled Nursing Facility Barriers to Discharge: Continued Medical Work up   Patient Goals and CMS Choice            Expected Discharge Plan and Services In-house Referral: Clinical Social Work Discharge Planning Services: CM Consult   Living arrangements for the past 2 months: Assisted Living Facility                                      Prior Living Arrangements/Services Living arrangements for the past 2 months: Assisted Living Facility                     Activities of Daily Living   ADL Screening (condition at time of admission) Independently performs ADLs?: No Does the patient have a NEW difficulty with bathing/dressing/toileting/self-feeding that is expected to last >3 days?: No Does the patient have a NEW difficulty with getting in/out of bed, walking, or climbing stairs that is expected to last >3 days?: No Does the patient have a NEW difficulty with communication that is expected to last >3 days?: No Is the patient deaf or have difficulty hearing?: No Does the patient have difficulty seeing, even when wearing glasses/contacts?: No Does the patient have difficulty concentrating, remembering, or making decisions?: No  Permission Sought/Granted                  Emotional Assessment              Admission diagnosis:   UTI (urinary tract infection) [N39.0] SIRS (systemic inflammatory response syndrome) (HCC) [R65.10] Patient Active Problem List   Diagnosis Date Noted   UTI (urinary tract infection) 11/08/2023   Sepsis secondary to UTI (HCC) 11/08/2023   Bladder retention 11/08/2023   AAA (abdominal aortic aneurysm) (HCC) 11/08/2023   Hypoalbuminemia 11/08/2023   GERD (gastroesophageal reflux disease) 11/08/2023   Acute UTI 05/31/2023   CAP (community acquired pneumonia) 02/05/2023   Aphasia 02/05/2023   Neurogenic bladder 04/18/2022   Acute metabolic encephalopathy 08/02/2021   Word finding difficulty 02/22/2021   Alzheimer's dementia without behavioral disturbance (HCC) 09/10/2020   History of COVID-19 07/26/2020   Orthostatic dizziness 07/19/2020   Balance problem 07/19/2020   Constipation 07/19/2020   Clitoral irritation 07/19/2020   Asymptomatic bacteriuria 07/19/2020   Urinary frequency 07/19/2020   Urge incontinence 07/19/2020   Weakness of left lower extremity 07/19/2020   Essential hypertension 07/19/2020   Weakness 07/19/2020   Impaired mobility 07/19/2020   Recurrent UTI 07/19/2020   Dementia without behavioral disturbance (HCC) 07/19/2020   Edema 07/19/2020   Spinal stenosis 07/19/2020   PCP:  Mahlon Gammon, MD Pharmacy:   [facility] - [place], [place] - [address] [place]  [place] [address] Phone: [phone] Fax: [phone]     Social Drivers of Health (SDOH) Social History: SDOH Screenings   Food Insecurity: No Food Insecurity (11/08/2023)  Housing: Unknown (11/08/2023)  Transportation Needs: No Transportation Needs (11/08/2023)  Utilities: Patient Unable To Answer (11/09/2023)  Depression (PHQ2-9): Low Risk  (12/05/2022)  Social Connections: Patient Unable To Answer (11/08/2023)  Tobacco Use: Low Risk  (10/27/2023)   SDOH Interventions:     Readmission Risk Interventions    02/11/2023    4:11 PM  Readmission  Risk Prevention Plan  Post Dischage Appt Complete  Medication Screening Complete  Transportation Screening Complete

## 2023-11-11 NOTE — Care Management Important Message (Signed)
Important Message  Patient Details  Name: Cheryl Monroe MRN: [id number] Date of Birth: [birth year]   Important Message Given:  Yes - Medicare IM     Dorena Bodo 11/11/2023, 3:34 PM

## 2023-11-11 NOTE — Progress Notes (Addendum)
PROGRESS NOTE    Cheryl Monroe  ZOX:[id number] DOB: 1935/01/30 DOA: 11/07/2023 PCP: Mahlon Gammon, MD   Brief Narrative:  This 89 yrs old female with medical history significant of hypertension, Alzheimer's dementia, aphasia, neurogenic bladder, recurrent UTIs, and osteoarthritis presented for evaluation for diarrhea and reports of decreased appetite.  Patient is not able to provide any significant information. Most of the history available from son over the phone.  He reports that she did not have issues with diarrhea but had been more lethargic and not her normal self for which they were concerned that she had developed a urinary tract infection. He makes note that she sometimes retains urine especially when she has a urinary tract infection, and may need In-N-Out cath.  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 Blood cultures have been obtained.  Assessment & Plan:   Principal Problem:   Sepsis secondary to UTI [facility]) Active Problems:   Bladder retention   Essential hypertension   Dementia without behavioral disturbance (HCC)   Aphasia   Weakness   AAA (abdominal aortic aneurysm) (HCC)   Hypoalbuminemia   GERD (gastroesophageal reflux disease)  Sepsis sec. to UTI, POA: Blood cultures > No growth so far, urine cultures contaminated. Continue with IV Ceftriaxone and IV Diflucan.  Afebrile and wbc count normalized.  Lactic acid normalized.  Continue IV fluids.   Diarrhea: Appears to have resolved.  No Bowel movement since yesterday.   Hypertension: Blood Pressure remains elevated. Continue Cozaar 25 mg daily. Increased  Norvasc to 5 mg for better blood pressure control. Continue hydralazine as needed. BP better controlled.    Alzheimer's dementia with aphasia/ dysarthria: She appears to be back to baseline.    Neurogenic bladder: Urine cultures contaminated. Pt on nitrofurantoin at home. Continue ceftriaxone for now,  may require oral antibiotics at discharge.   Dilation of the ascending thoracic aorta 4.0 cm in diameter Monitor outpatient with PCP/ vascular surgery.      PT and OT evaluation     Estimated body mass index is 29.96 kg/m as calculated from the following:   Height as of this encounter: 5\' 2"  (1.575 m).   Weight as of this encounter: 74.3 kg.   DVT prophylaxis: Lovenox Code Status: DNR Family Communication: No family at bed side Disposition Plan:    Status is: Inpatient Remains inpatient appropriate because: Clinical improvement, urine culture contaminated.    Consultants:  None  Procedures: None  Antimicrobials:  Anti-infectives (From admission, onward)    Start     Dose/Rate Route Frequency Ordered Stop   11/08/23 1300  fluconazole (DIFLUCAN) tablet 100 mg  Status:  Discontinued        100 mg Oral Every 24 hours 11/08/23 1204 11/08/23 1208   11/08/23 1300  fluconazole (DIFLUCAN) IVPB 100 mg        100 mg 50 mL/hr over 60 Minutes Intravenous Every 24 hours 11/08/23 1208     11/08/23 1145  cefTRIAXone (ROCEPHIN) 1 g in sodium chloride 0.9 % 100 mL IVPB        1 g 200 mL/hr over 30 Minutes Intravenous Every 24 hours 11/08/23 1143     11/08/23 0415  piperacillin-tazobactam (ZOSYN) IVPB 3.375 g  3.375 g 100 mL/hr over 30 Minutes Intravenous  Once 11/08/23 0410 11/08/23 0504      Subjective: Patient was seen and examined at bedside. Overnight events noted.   Patient seems slightly confused today but seems at her baseline.   Objective: Vitals:   11/10/23 2136 11/10/23 2136 11/11/23 0521 11/11/23 0900  BP: (!) 179/78 (!) 179/78 (!) 157/138 (!) 119/55   Pulse: 78 78 63 65  Resp: 19 19 20    Temp: 97.6 F (36.4 C) 97.6 F (36.4 C) (!) 97.5 F (36.4 C) 98 F (36.7 C)  TempSrc: Oral Oral  Oral  SpO2: 100% 100% 100% 96%  Weight:      Height:        Intake/Output Summary (Last 24 hours) at 11/11/2023 1215 Last data filed at 11/11/2023 1123 Gross per 24 hour  Intake 1834.64 ml  Output 700 ml  Net 1134.64 ml   Filed Weights   11/08/23 1919  Weight: 74.3 kg    Examination:  General exam: Appears comfortable, deconditioned, not in any acute distress. Respiratory system: CTA bilaterally. Respiratory effort normal.  RR 16 Cardiovascular system: S1 & S2 heard, RRR. No JVD, murmurs, rubs, gallops or clicks. Gastrointestinal system: Abdomen is non distended, soft and non tender. Normal bowel sounds heard. Central nervous system: Alert and oriented x 1. No focal neurological deficits. Extremities: No edema, no cyanosis, no clubbing Skin: No rashes, lesions or ulcers Psychiatry: Judgement and insight appear normal. Mood & affect appropriate.     Data Reviewed: I have personally reviewed following labs and imaging studies  CBC: Recent Labs  Lab 11/07/23 2052 11/09/23 0739  WBC 13.1* 6.4  NEUTROABS 10.3*  --   HGB 13.9 13.4  HCT 43.9 41.8  MCV 88.5 87.3  PLT 191 178   Basic Metabolic Panel: Recent Labs  Lab 11/07/23 2052 11/09/23 0739  NA 139 142  K 4.2 3.5  CL 103 106  CO2 24 26  GLUCOSE 181* 113*  BUN 22 10  CREATININE 0.85 0.54  CALCIUM 9.2 8.8*   GFR: Estimated Creatinine Clearance: 47.7 mL/min (by C-G formula based on SCr of 0.54 mg/dL). Liver Function Tests: Recent Labs  Lab 11/08/23 0422  AST 29  ALT 18  ALKPHOS 87  BILITOT 0.3  PROT 6.3*  ALBUMIN 3.2*   Recent Labs  Lab 11/07/23 2052  LIPASE 42   No results for input(s): "AMMONIA" in the last 168 hours. Coagulation Profile: No results for input(s): "INR", "PROTIME" in the last 168 hours. Cardiac Enzymes: No results for input(s): "CKTOTAL",  "CKMB", "CKMBINDEX", "TROPONINI" in the last 168 hours. BNP (last 3 results) No results for input(s): "PROBNP" in the last 8760 hours. HbA1C: No results for input(s): "HGBA1C" in the last 72 hours. CBG: Recent Labs  Lab 11/08/23 0416  GLUCAP 130*   Lipid Profile: No results for input(s): "CHOL", "HDL", "LDLCALC", "TRIG", "CHOLHDL", "LDLDIRECT" in the last 72 hours. Thyroid Function Tests: No results for input(s): "TSH", "T4TOTAL", "FREET4", "T3FREE", "THYROIDAB" in the last 72 hours.  Anemia Panel: No results for input(s): "VITAMINB12", "FOLATE", "FERRITIN", "TIBC", "IRON", "RETICCTPCT" in the last 72 hours. Sepsis Labs: Recent Labs  Lab 11/08/23 0422 11/08/23 0610  LATICACIDVEN 3.3* 1.5    Recent Results (from the past 240 hours)  Blood culture (routine x 2)     Status: None (Preliminary result)   Collection Time: 11/08/23  4:22 AM   Specimen: BLOOD RIGHT HAND  Result Value Ref Range Status   Specimen Description BLOOD RIGHT  HAND  Final   Special Requests   Final    BOTTLES DRAWN AEROBIC ONLY Blood Culture results may not be optimal due to an inadequate volume of blood received in culture bottles   Culture   Final    NO GROWTH 3 DAYS Performed at [facility], [address], [place], [place] [address]    Report Status PENDING  Incomplete  Blood culture (routine x 2)     Status: None (Preliminary result)   Collection Time: 11/08/23  4:28 AM   Specimen: BLOOD LEFT ARM  Result Value Ref Range Status   Specimen Description BLOOD LEFT ARM  Final   Special Requests   Final    BOTTLES DRAWN AEROBIC AND ANAEROBIC Blood Culture adequate volume   Culture   Final    NO GROWTH 3 DAYS Performed at [facility], [address], [place], [place] [address]    Report Status PENDING  Incomplete  Urine Culture     Status: Abnormal   Collection Time: 11/08/23 11:57 AM   Specimen: Urine, Clean Catch  Result Value Ref Range Status   Specimen Description URINE, CLEAN  CATCH  Final   Special Requests   Final    NONE Performed at [facility], [address], [place], [place] [address]    Culture MULTIPLE SPECIES PRESENT, SUGGEST RECOLLECTION (A)  Final   Report Status 11/09/2023 FINAL  Final    Radiology Studies: No results found.  Scheduled Meds:  acidophilus  1 capsule Oral BID   amLODipine  5 mg Oral Daily   enoxaparin (LOVENOX) injection  40 mg Subcutaneous Q24H   feeding supplement  237 mL Oral BID BM   losartan  25 mg Oral Daily   pantoprazole  20 mg Oral BID   sodium chloride flush  3 mL Intravenous Q12H   Continuous Infusions:  sodium chloride 75 mL/hr at 11/11/23 1123   cefTRIAXone (ROCEPHIN)  IV 1 g (11/11/23 1140)   fluconazole (DIFLUCAN) IV 50 mL/hr at 11/10/23 1500     LOS: 3 days    Time spent: 35 mins    Willeen Niece, MD [facility]   If 7PM-7AM, please contact night-coverage

## 2023-11-12 DIAGNOSIS — N390 Urinary tract infection, site not specified: Secondary | ICD-10-CM | POA: Diagnosis not present

## 2023-11-12 DIAGNOSIS — A419 Sepsis, unspecified organism: Secondary | ICD-10-CM | POA: Diagnosis not present

## 2023-11-12 LAB — CBC
HCT: 43.8 % (ref 36.0–46.0)
Hemoglobin: 14.1 g/dL (ref 12.0–15.0)
MCH: 28 pg (ref 26.0–34.0)
MCHC: 32.2 g/dL (ref 30.0–36.0)
MCV: 86.9 fL (ref 80.0–100.0)
Platelets: 209 10*3/uL (ref 150–400)
RBC: 5.04 MIL/uL (ref 3.87–5.11)
RDW: 13.4 % (ref 11.5–15.5)
WBC: 6.7 10*3/uL (ref 4.0–10.5)
nRBC: 0 % (ref 0.0–0.2)

## 2023-11-12 LAB — BASIC METABOLIC PANEL
Anion gap: 8 (ref 5–15)
BUN: 8 mg/dL (ref 8–23)
CO2: 27 mmol/L (ref 22–32)
Calcium: 9 mg/dL (ref 8.9–10.3)
Chloride: 107 mmol/L (ref 98–111)
Creatinine, Ser: 0.49 mg/dL (ref 0.44–1.00)
GFR, Estimated: >60 mL/min (ref >60)
Glucose, Bld: 119 mg/dL — ABNORMAL HIGH (ref 70–99)
Potassium: 3.4 mmol/L — ABNORMAL LOW (ref 3.5–5.1)
Sodium: 142 mmol/L (ref 135–145)

## 2023-11-12 LAB — MAGNESIUM: Magnesium: 2 mg/dL (ref 1.7–2.4)

## 2023-11-12 LAB — PHOSPHORUS: Phosphorus: 2.8 mg/dL (ref 2.5–4.6)

## 2023-11-12 MED ORDER — AMLODIPINE BESYLATE 10 MG PO TABS
10.0000 mg | ORAL_TABLET | Freq: Every day | ORAL | Status: DC
  Administered 2023-11-13: 10 mg via ORAL
  Filled 2023-11-12: qty 1

## 2023-11-12 MED ORDER — POTASSIUM CHLORIDE CRYS ER 20 MEQ PO TBCR
40.0000 meq | EXTENDED_RELEASE_TABLET | Freq: Once | ORAL | Status: AC
  Administered 2023-11-12: 40 meq via ORAL
  Filled 2023-11-12: qty 2

## 2023-11-12 NOTE — Progress Notes (Signed)
Bladder scan showed 324cc. In and out catheterization done aseptically,assisted by Janine Ores and Estill Dooms. Drained 400 cc of amber urine. Patient tolerated it well.

## 2023-11-12 NOTE — Progress Notes (Signed)
PROGRESS NOTE    Cheryl Monroe  QMV:[id number] DOB: 1935-03-15 DOA: 11/07/2023 PCP: Mahlon Gammon, MD   Brief Narrative:  89 yrs old female with medical history significant of hypertension, Alzheimer's dementia, aphasia, neurogenic bladder, recurrent UTIs, and osteoarthritis presented for evaluation for diarrhea and reports of decreased appetite with increased lethargy.  On presentation, CT of abdomen and pelvis did not show any acute intra-abdominal pathology but showed mild UPJ obstruction.  She was started on IV antibiotics for sepsis secondary to UTI.  Assessment & Plan:   Sepsis: Present on admission UTI: Present on admission -Blood cultures no growth so far.  Urine cultures showed multiple species -Currently on Rocephin and Diflucan -Sepsis has resolved.  Currently hemodynamically stable with no temperature spikes. -Lactic acid has normalized.  DC IV fluids  Diarrhea -Appears to have resolved  Hypertension -Blood pressure continues to be elevated.  Continue losartan.  Increase Norvasc to 10 mg daily.  Use hydralazine as needed  Alzheimer's dementia with aphasia/dysarthria Physical deconditioning -Appears to be back to baseline.  PT recommended no PT follow-up as patient will continue to bleed long-term care.  TOC following. -Fall precautions.  Delirium precautions.  Neurogenic bladder -Outpatient follow-up with urology.  Patient on nitrofurantoin prior to presentation  Dilatation of the ascending thoracic aorta 4 cm in diameter -Outpatient follow-up with PCP/vascular surgery  Goals of care -Consult palliative care for goals of care discussion   DVT prophylaxis: Lovenox Code Status: DNR Family Communication: None at bedside Disposition Plan: Status is: Inpatient Remains inpatient appropriate because: Of severity of illness    Consultants: None  Procedures: None  Antimicrobials:  Anti-infectives (From admission, onward)    Start     Dose/Rate Route  Frequency Ordered Stop   11/08/23 1300  fluconazole (DIFLUCAN) tablet 100 mg  Status:  Discontinued        100 mg Oral Every 24 hours 11/08/23 1204 11/08/23 1208   11/08/23 1300  fluconazole (DIFLUCAN) IVPB 100 mg        100 mg 50 mL/hr over 60 Minutes Intravenous Every 24 hours 11/08/23 1208     11/08/23 1145  cefTRIAXone (ROCEPHIN) 1 g in sodium chloride 0.9 % 100 mL IVPB        1 g 200 mL/hr over 30 Minutes Intravenous Every 24 hours 11/08/23 1143     11/08/23 0415  piperacillin-tazobactam (ZOSYN) IVPB 3.375 g        3.375 g 100 mL/hr over 30 Minutes Intravenous  Once 11/08/23 0410 11/08/23 0504        Subjective: Patient seen and examined at bedside.  No seizures, vomiting, agitation reported.  Objective: Vitals:   11/11/23 2129 11/11/23 2358 11/12/23 0617 11/12/23 0639  BP: (!) 185/86 (!) 169/78 (!) 190/84 (!) 165/68  Pulse:  70 66 64  Resp:   18   Temp:   98.1 F (36.7 C)   TempSrc:      SpO2:   99%   Weight:      Height:        Intake/Output Summary (Last 24 hours) at 11/12/2023 0840 Last data filed at 11/12/2023 0726 Gross per 24 hour  Intake 2845.4 ml  Output 2210 ml  Net 635.4 ml   Filed Weights   11/08/23 1919  Weight: 74.3 kg    Examination:  General exam: Appears calm and comfortable.  On room air. Respiratory system: Bilateral decreased breath sounds at bases Cardiovascular system: S1 & S2 heard, Rate controlled Gastrointestinal system: Abdomen is  nondistended, soft and nontender. Normal bowel sounds heard. Extremities: No cyanosis, clubbing, edema  Central nervous system: Awake but confused.  No focal neurological deficits. Moving extremities Skin: No rashes, lesions or ulcers Psychiatry: Flat affect.  Not agitated    Data Reviewed: I have personally reviewed following labs and imaging studies  CBC: Recent Labs  Lab 11/07/23 2052 11/09/23 0739 11/12/23 0336  WBC 13.1* 6.4 6.7  NEUTROABS 10.3*  --   --   HGB 13.9 13.4 14.1  HCT 43.9  41.8 43.8  MCV 88.5 87.3 86.9  PLT 191 178 209   Basic Metabolic Panel: Recent Labs  Lab 11/07/23 2052 11/09/23 0739 11/12/23 0336  NA 139 142 142  K 4.2 3.5 3.4*  CL 103 106 107  CO2 24 26 27   GLUCOSE 181* 113* 119*  BUN 22 10 8   CREATININE 0.85 0.54 0.49  CALCIUM 9.2 8.8* 9.0  MG  --   --  2.0  PHOS  --   --  2.8   GFR: Estimated Creatinine Clearance: 47.7 mL/min (by C-G formula based on SCr of 0.49 mg/dL). Liver Function Tests: Recent Labs  Lab 11/08/23 0422  AST 29  ALT 18  ALKPHOS 87  BILITOT 0.3  PROT 6.3*  ALBUMIN 3.2*   Recent Labs  Lab 11/07/23 2052  LIPASE 42   No results for input(s): "AMMONIA" in the last 168 hours. Coagulation Profile: No results for input(s): "INR", "PROTIME" in the last 168 hours. Cardiac Enzymes: No results for input(s): "CKTOTAL", "CKMB", "CKMBINDEX", "TROPONINI" in the last 168 hours. BNP (last 3 results) No results for input(s): "PROBNP" in the last 8760 hours. HbA1C: No results for input(s): "HGBA1C" in the last 72 hours. CBG: Recent Labs  Lab 11/08/23 0416  GLUCAP 130*   Lipid Profile: No results for input(s): "CHOL", "HDL", "LDLCALC", "TRIG", "CHOLHDL", "LDLDIRECT" in the last 72 hours. Thyroid Function Tests: No results for input(s): "TSH", "T4TOTAL", "FREET4", "T3FREE", "THYROIDAB" in the last 72 hours. Anemia Panel: No results for input(s): "VITAMINB12", "FOLATE", "FERRITIN", "TIBC", "IRON", "RETICCTPCT" in the last 72 hours. Sepsis Labs: Recent Labs  Lab 11/08/23 0422 11/08/23 0610  LATICACIDVEN 3.3* 1.5    Recent Results (from the past 240 hours)  Blood culture (routine x 2)     Status: None (Preliminary result)   Collection Time: 11/08/23  4:22 AM   Specimen: BLOOD RIGHT HAND  Result Value Ref Range Status   Specimen Description BLOOD RIGHT HAND  Final   Special Requests   Final    BOTTLES DRAWN AEROBIC ONLY Blood Culture results may not be optimal due to an inadequate volume of blood received in  culture bottles   Culture   Final    NO GROWTH 3 DAYS Performed at [facility], [address], [place], [place] [address]    Report Status PENDING  Incomplete  Blood culture (routine x 2)     Status: None (Preliminary result)   Collection Time: 11/08/23  4:28 AM   Specimen: BLOOD LEFT ARM  Result Value Ref Range Status   Specimen Description BLOOD LEFT ARM  Final   Special Requests   Final    BOTTLES DRAWN AEROBIC AND ANAEROBIC Blood Culture adequate volume   Culture   Final    NO GROWTH 3 DAYS Performed at [facility], [address] [place], [place] [address]    Report Status PENDING  Incomplete  Urine Culture     Status: Abnormal   Collection Time: 11/08/23  11:57 AM   Specimen: Urine, Clean Catch  Result Value Ref Range Status   Specimen Description URINE, CLEAN CATCH  Final   Special Requests   Final    NONE Performed at [facility], [address] [place], [place] [address]    Culture MULTIPLE SPECIES PRESENT, SUGGEST RECOLLECTION (A)  Final   Report Status 11/09/2023 FINAL  Final         Radiology Studies: No results found.      Scheduled Meds:  acidophilus  1 capsule Oral BID   amLODipine  5 mg Oral Daily   enoxaparin (LOVENOX) injection  40 mg Subcutaneous Q24H   feeding supplement  237 mL Oral BID BM   losartan  25 mg Oral Daily   pantoprazole  20 mg Oral BID   sodium chloride flush  3 mL Intravenous Q12H   Continuous Infusions:  sodium chloride 75 mL/hr at 11/12/23 0242   cefTRIAXone (ROCEPHIN)  IV 1 g (11/11/23 1140)   fluconazole (DIFLUCAN) IV 100 mg (11/11/23 1358)          Glade Lloyd, MD [facility] 11/12/2023, 8:40 AM

## 2023-11-12 NOTE — TOC Progression Note (Signed)
Transition of Care [facility]) - Progression Note    Patient Details  Name: Cheryl Monroe MRN: [id number] Date of Birth: 05-07-1935  [facility]) CM/SW Contact  Michaela Corner, [place] Phone Number: 11/12/2023, 2:28 PM  Clinical Narrative:   CSW spoke with admissions at [facility] and they confirmed that pt is from LTC side of [facility] and can return when medically stable.  TOC will continue to follow.     Expected Discharge Plan: Skilled Nursing Facility Barriers to Discharge: Continued Medical Work up  Expected Discharge Plan and Services In-house Referral: Clinical Social Work Discharge Planning Services: CM Consult   Living arrangements for the past 2 months: Assisted Living Facility                                       Social Determinants of Health (SDOH) Interventions SDOH Screenings   Food Insecurity: No Food Insecurity (11/08/2023)  Housing: Unknown (11/08/2023)  Transportation Needs: No Transportation Needs (11/08/2023)  Utilities: Patient Unable To Answer (11/09/2023)  Depression (PHQ2-9): Low Risk  (12/05/2022)  Social Connections: Patient Unable To Answer (11/08/2023)  Tobacco Use: Low Risk  (10/27/2023)    Readmission Risk Interventions    02/11/2023    4:11 PM  Readmission Risk Prevention Plan  Post Dischage Appt Complete  Medication Screening Complete  Transportation Screening Complete

## 2023-11-12 NOTE — Plan of Care (Signed)

## 2023-11-13 ENCOUNTER — Non-Acute Institutional Stay (SKILLED_NURSING_FACILITY): Payer: Medicare Other | Admitting: Adult Health

## 2023-11-13 DIAGNOSIS — M4800 Spinal stenosis, site unspecified: Secondary | ICD-10-CM

## 2023-11-13 DIAGNOSIS — K5901 Slow transit constipation: Secondary | ICD-10-CM | POA: Diagnosis not present

## 2023-11-13 DIAGNOSIS — R531 Weakness: Secondary | ICD-10-CM

## 2023-11-13 DIAGNOSIS — R0989 Other specified symptoms and signs involving the circulatory and respiratory systems: Secondary | ICD-10-CM | POA: Diagnosis not present

## 2023-11-13 DIAGNOSIS — A419 Sepsis, unspecified organism: Secondary | ICD-10-CM

## 2023-11-13 DIAGNOSIS — G301 Alzheimer's disease with late onset: Secondary | ICD-10-CM

## 2023-11-13 DIAGNOSIS — N390 Urinary tract infection, site not specified: Secondary | ICD-10-CM | POA: Diagnosis not present

## 2023-11-13 DIAGNOSIS — F0280 Dementia in other diseases classified elsewhere without behavioral disturbance: Secondary | ICD-10-CM

## 2023-11-13 DIAGNOSIS — I714 Abdominal aortic aneurysm, without rupture, unspecified: Secondary | ICD-10-CM

## 2023-11-13 LAB — CBC WITH DIFFERENTIAL/PLATELET
Abs Immature Granulocytes: 0.03 10*3/uL (ref 0.00–0.07)
Basophils Absolute: 0.1 10*3/uL (ref 0.0–0.1)
Basophils Relative: 1 %
Eosinophils Absolute: 0.2 10*3/uL (ref 0.0–0.5)
Eosinophils Relative: 3 %
HCT: 42 % (ref 36.0–46.0)
Hemoglobin: 13.7 g/dL (ref 12.0–15.0)
Immature Granulocytes: 0 %
Lymphocytes Relative: 13 %
Lymphs Abs: 0.9 10*3/uL (ref 0.7–4.0)
MCH: 28.2 pg (ref 26.0–34.0)
MCHC: 32.6 g/dL (ref 30.0–36.0)
MCV: 86.4 fL (ref 80.0–100.0)
Monocytes Absolute: 0.8 10*3/uL (ref 0.1–1.0)
Monocytes Relative: 11 %
Neutro Abs: 5.1 10*3/uL (ref 1.7–7.7)
Neutrophils Relative %: 72 %
Platelets: 174 10*3/uL (ref 150–400)
RBC: 4.86 MIL/uL (ref 3.87–5.11)
RDW: 13.5 % (ref 11.5–15.5)
WBC: 7.1 10*3/uL (ref 4.0–10.5)
nRBC: 0 % (ref 0.0–0.2)

## 2023-11-13 LAB — CULTURE, BLOOD (ROUTINE X 2)
Culture: NO GROWTH
Culture: NO GROWTH
Special Requests: ADEQUATE

## 2023-11-13 LAB — COMPREHENSIVE METABOLIC PANEL
ALT: 30 U/L (ref 0–44)
AST: 29 U/L (ref 15–41)
Albumin: 3 g/dL — ABNORMAL LOW (ref 3.5–5.0)
Alkaline Phosphatase: 73 U/L (ref 38–126)
Anion gap: 9 (ref 5–15)
BUN: 6 mg/dL — ABNORMAL LOW (ref 8–23)
CO2: 25 mmol/L (ref 22–32)
Calcium: 9.1 mg/dL (ref 8.9–10.3)
Chloride: 106 mmol/L (ref 98–111)
Creatinine, Ser: 0.53 mg/dL (ref 0.44–1.00)
GFR, Estimated: >60 mL/min (ref >60)
Glucose, Bld: 147 mg/dL — ABNORMAL HIGH (ref 70–99)
Potassium: 3.4 mmol/L — ABNORMAL LOW (ref 3.5–5.1)
Sodium: 140 mmol/L (ref 135–145)
Total Bilirubin: 0.5 mg/dL (ref 0.0–1.2)
Total Protein: 6.5 g/dL (ref 6.5–8.1)

## 2023-11-13 LAB — MAGNESIUM: Magnesium: 2 mg/dL (ref 1.7–2.4)

## 2023-11-13 MED ORDER — POTASSIUM CHLORIDE CRYS ER 20 MEQ PO TBCR
40.0000 meq | EXTENDED_RELEASE_TABLET | Freq: Once | ORAL | Status: AC
  Administered 2023-11-13: 40 meq via ORAL
  Filled 2023-11-13: qty 2

## 2023-11-13 MED ORDER — NITROFURANTOIN MACROCRYSTAL 50 MG PO CAPS: 50.0000 mg | ORAL_CAPSULE | Freq: Two times a day (BID) | ORAL | Status: DC

## 2023-11-13 NOTE — Progress Notes (Addendum)
Pharmacy Antibiotic Note  Cheryl Monroe is a 89 y.o. female for which pharmacy has been consulted for  fluconazole  dosing for UTI.  Estimated Creatinine Clearance: 47.7 mL/min (by C-G formula based on SCr of 0.49 mg/dL). WBC 13.1; LA 1.5; T 99.7; HR 59; RR 31  Plan: D/W Dr. Hanley Ben Continue Fluconazole 100mg  Iv q24h (patient with poor PO intake) through 1/23 Continue Ceftriaxone per MD through 1/23 Monitor WBC, fever, renal function, cultures   Height: 5\' 2"  (157.5 cm) Weight: 74.3 kg (163 lb 12.8 oz) IBW/kg (Calculated) : 50.1  Temp (24hrs), Avg:98.2 F (36.8 C), Min:98.1 F (36.7 C), Max:98.3 F (36.8 C)  Recent Labs  Lab 11/07/23 2052 11/08/23 0422 11/08/23 0610 11/09/23 0739 11/12/23 0336  WBC 13.1*  --   --  6.4 6.7  CREATININE 0.85  --   --  0.54 0.49  LATICACIDVEN  --  3.3* 1.5  --   --     Estimated Creatinine Clearance: 47.7 mL/min (by C-G formula based on SCr of 0.49 mg/dL).    Allergies  Allergen Reactions   Sulfa Antibiotics Rash   Microbiology results:  1/18 Urine Cxs: Multiple species  Jayquon Theiler A. Jeanella Craze, PharmD, BCPS, FNKF Clinical Pharmacist [place] Please utilize Amion for appropriate phone number to reach the unit pharmacist [facility])

## 2023-11-13 NOTE — TOC Transition Note (Signed)
Transition of Care [facility]) - Discharge Note   Patient Details  Name: Cheryl Monroe MRN: [id number] Date of Birth: [birth year]  Transition of Care [facility]) CM/SW Contact:  Erin Sons, LCSW Phone Number: 11/13/2023, 11:46 AM   Clinical Narrative:     Per MD patient ready for DC to [facility]. RN, patient, patient's family, and facility notified of DC. Discharge Summary and FL2 sent to facility. RN to call report prior to discharge [phone]). DC packet on chart. Ambulance transport requested for patient.   CSW will sign off for now as social work intervention is no longer needed. Please consult [place] again if new needs arise.   Final next level of care: Skilled Nursing Facility Barriers to Discharge: No Barriers Identified   Patient Goals and CMS Choice            Discharge Placement              Patient chooses bed at: [facility] Patient to be transferred to facility by: ptar   Patient and family notified of of transfer: 11/13/23  Discharge Plan and Services Additional resources added to the After Visit Summary for   In-house Referral: Clinical Social Work Discharge Planning Services: CM Consult                                 Social Drivers of Health (SDOH) Interventions SDOH Screenings   Food Insecurity: No Food Insecurity (11/08/2023)  Housing: Unknown (11/08/2023)  Transportation Needs: No Transportation Needs (11/08/2023)  Utilities: Patient Unable To Answer (11/09/2023)  Depression (PHQ2-9): Low Risk  (12/05/2022)  Social Connections: Patient Unable To Answer (11/08/2023)  Tobacco Use: Low Risk  (10/27/2023)     Readmission Risk Interventions    02/11/2023    4:11 PM  Readmission Risk Prevention Plan  Post Dischage Appt Complete  Medication Screening Complete  Transportation Screening Complete

## 2023-11-13 NOTE — Progress Notes (Signed)
Report called to [facility] and given to Virgil at [phone]  Pt unable to get the diflucan and dralekh made aware that the PTAR was here to get her and he knows that she will not get this dose

## 2023-11-13 NOTE — Discharge Summary (Addendum)
Physician Discharge Summary  Cheryl Monroe HYQ:[id number] DOB: 03/26/1935 DOA: 11/07/2023  PCP: Mahlon Gammon, MD  Admit date: 11/07/2023 Discharge date: 11/13/2023  Admitted From: SNF Disposition: SNF  Recommendations for Outpatient Follow-up:  Follow up with SNF provider at earliest convenience Outpatient evaluation and follow-up by palliative care for goals of care discussion  follow up in ED if symptoms worsen or new appear   Home Health: No Equipment/Devices: None  Discharge Condition: Stable CODE STATUS: DNR Diet recommendation: Heart healthy  Brief/Interim Summary: 89 yrs old female with medical history significant of hypertension, Alzheimer's dementia, aphasia, neurogenic bladder, recurrent UTIs, and osteoarthritis presented for evaluation for diarrhea and reports of decreased appetite with increased lethargy.  On presentation, CT of abdomen and pelvis did not show any acute intra-abdominal pathology but showed mild UPJ obstruction.  She was started on IV antibiotics for sepsis secondary to UTI.  During the hospitalization, her condition has improved.  She is currently hemodynamically stable with stable labs and vitals.  Sepsis has resolved.  She is currently on IV Diflucan and Rocephin.  Urine cultures grew multiple species.  Today is day #7 of IV antibiotics.  She will be discharged back to SNF today and she can resume suppressive nitrofurantoin.  Discharge Diagnoses:   Sepsis: Present on admission UTI: Present on admission -Blood cultures no growth so far.  Urine cultures showed multiple species -Currently on Rocephin and Diflucan -Sepsis has resolved.  Currently hemodynamically stable with no temperature spikes. -Lactic acid has normalized.  Off IV fluids -Today is day #7 of IV antibiotics.  She will be discharged back to SNF today and she can resume suppressive nitrofurantoin.   Diarrhea -Appears to have resolved  Hypokalemia -Replace prior to discharge.   Outpatient follow-up.   Hypertension -Blood pressure improving.  Continue losartan and as needed hydralazine.  Son concerned about bilateral pedal edema and thinks that this is due to amlodipine.  Discontinue amlodipine on discharge.  Outpatient follow-up with SNF provider regarding adjusting doses.  Losartan dose can probably be increased as well as an outpatient.    Alzheimer's dementia with aphasia/dysarthria Physical deconditioning -Appears to be back to baseline.  PT recommended no PT follow-up as patient will continue to need long-term care.   -Fall precautions.  Delirium precautions.   Neurogenic bladder -Outpatient follow-up with urology.  Patient on nitrofurantoin prior to presentation   Dilatation of the ascending thoracic aorta 4 cm in diameter -Outpatient follow-up with PCP/vascular surgery   Goals of care -palliative care for goals of care discussion is pending.  This can happen as an outpatient.  Discharge Instructions  Discharge Instructions     Amb Referral to Palliative Care   Complete by: As directed    Diet - low sodium heart healthy   Complete by: As directed    Increase activity slowly   Complete by: As directed       Allergies as of 11/13/2023       Reactions   Sulfa Antibiotics Rash        Medication List     STOP taking these medications    amLODipine 2.5 MG tablet Commonly known as: NORVASC       TAKE these medications    hydrALAZINE 10 MG tablet Commonly known as: APRESOLINE Take 10 mg by mouth every 8 (eight) hours as needed (SBP > 170).   losartan 25 MG tablet Commonly known as: COZAAR Take 25 mg by mouth daily.   nitrofurantoin 50 MG capsule Commonly  known as: MACRODANTIN Take 1 capsule (50 mg total) by mouth 2 (two) times daily. Start taking on: November 14, 2023   pantoprazole 20 MG tablet Commonly known as: Protonix Take 1 tablet (20 mg total) by mouth 2 (two) times daily.   polyethylene glycol 17 g packet Commonly  known as: MIRALAX / GLYCOLAX Take 17 g by mouth every other day.   senna 8.6 MG Tabs tablet Commonly known as: SENOKOT Take 17.2 mg by mouth every evening.   THERACRAN PO Take 250 mg by mouth 2 (two) times daily.   Tylenol Dissolve Packs 500 MG Pack Generic drug: Acetaminophen Take 2 packets by mouth daily.        Follow-up Information     Mahlon Gammon, MD. Schedule an appointment as soon as possible for a visit in 1 week(s).   Specialty: Internal Medicine Contact information: [address] [place] [address] [phone]                Allergies  Allergen Reactions   Sulfa Antibiotics Rash    Consultations: Palliative care consultation pending   Procedures/Studies: CT ABDOMEN PELVIS W CONTRAST Result Date: 11/08/2023 CLINICAL DATA:  Bowel obstruction EXAM: CT ABDOMEN AND PELVIS WITH CONTRAST TECHNIQUE: Multidetector CT imaging of the abdomen and pelvis was performed using the standard protocol following bolus administration of intravenous contrast. RADIATION DOSE REDUCTION: This exam was performed according to the departmental dose-optimization program which includes automated exposure control, adjustment of the mA and/or kV according to patient size and/or use of iterative reconstruction technique. CONTRAST:  75mL OMNIPAQUE IOHEXOL 350 MG/ML SOLN COMPARISON:  None Available. FINDINGS: Lower chest: Extensive multi-vessel coronary artery calcification. Global cardiac size within normal limits. There is dilation of the ascending thoracic aorta which measures at least 4.0 cm in diameter and is incompletely visualized on this examination. This is similar, however, to prior examination 02/04/2023. Bibasilar dependent atelectasis. Hepatobiliary: Evaluation of the upper abdominal solid organs is limited by respiratory motion artifact. Allowing for this, the liver and gallbladder are unremarkable. No intra or extrahepatic biliary ductal dilation. Pancreas: Unremarkable  Spleen: Unremarkable Adrenals/Urinary Tract: The adrenal glands are unremarkable. Mild asymmetric right renal cortical atrophy. The kidneys are normal in position. Mild asymmetric dilation of the right renal pelvis with decompression of the right ureter suggests a mild UPJ obstruction. No intrarenal or ureteral calculi. No hydronephrosis on the left. The bladder is unremarkable. Stomach/Bowel: Stomach is within normal limits. Appendix appears normal. No evidence of bowel wall thickening, distention, or inflammatory changes. Vascular/Lymphatic: There is dilation of the left gonadal vein with bilateral adnexal varices identified suggesting changes of ovarian vein reflux and pelvic venous insufficiency. Moderate atherosclerotic calcification within the abdominal aorta. No aortic aneurysm. No pathologic adenopathy within the abdomen and pelvis. Reproductive: Pessary in place. Pelvic organs are otherwise unremarkable. Other: Diastasis of the rectus abdominus musculature noted. No abdominopelvic ascites Musculoskeletal: The osseous structures are age-appropriate. No acute bone abnormality IMPRESSION: 1. No acute intra-abdominal pathology identified. No definite radiographic explanation for the patient's reported symptoms. 2. Extensive multi-vessel coronary artery calcification. 3. Dilation of the ascending thoracic aorta which measures at least 4.0 cm in diameter and is incompletely visualized on this examination. This is similar, however, to prior examination 02/04/2023. Follow-up recommendations are as outlined on prior examination. 4. 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 5. Dilation of the left gonadal vein with bilateral adnexal varices identified suggesting changes of ovarian vein reflux and pelvic venous insufficiency. Aortic  Atherosclerosis (ICD10-I70.0). Electronically Signed   By: Helyn Numbers M.D.   On: 11/08/2023 01:21      Subjective: Patient seen and examined at  bedside.  Wakes up slightly, remains confused.  No fever, seizures, vomiting or agitation reported.  Discharge Exam: Vitals:   11/13/23 0554 11/13/23 0932  BP: (!) 163/64 131/83  Pulse: 71 69  Resp: 18 18  Temp: 98.3 F (36.8 C) 98.3 F (36.8 C)  SpO2: 98% 94%    General: Elderly female lying in bed.  Wakes up slightly, confused.  On room air.  Looks chronically ill and deconditioned.  Slow to respond.  Flat affect. Cardiovascular: rate controlled, S1/S2 + Respiratory: bilateral decreased breath sounds at bases Abdominal: Soft, NT, ND, bowel sounds + Extremities: no edema, no cyanosis    The results of significant diagnostics from this hospitalization (including imaging, microbiology, ancillary and laboratory) are listed below for reference.     Microbiology: Recent Results (from the past 240 hours)  Blood culture (routine x 2)     Status: None (Preliminary result)   Collection Time: 11/08/23  4:22 AM   Specimen: BLOOD RIGHT HAND  Result Value Ref Range Status   Specimen Description BLOOD RIGHT HAND  Final   Special Requests   Final    BOTTLES DRAWN AEROBIC ONLY Blood Culture results may not be optimal due to an inadequate volume of blood received in culture bottles   Culture   Final    NO GROWTH 4 DAYS Performed at [facility], [address] [place], [place] [address]    Report Status PENDING  Incomplete  Blood culture (routine x 2)     Status: None (Preliminary result)   Collection Time: 11/08/23  4:28 AM   Specimen: BLOOD LEFT ARM  Result Value Ref Range Status   Specimen Description BLOOD LEFT ARM  Final   Special Requests   Final    BOTTLES DRAWN AEROBIC AND ANAEROBIC Blood Culture adequate volume   Culture   Final    NO GROWTH 4 DAYS Performed at [facility], [address] [place], [place] [address]    Report Status PENDING  Incomplete  Urine Culture     Status: Abnormal   Collection Time: 11/08/23 11:57 AM   Specimen: Urine, Clean Catch   Result Value Ref Range Status   Specimen Description URINE, CLEAN CATCH  Final   Special Requests   Final    NONE Performed at [facility], [address], [place], [place] [address]    Culture MULTIPLE SPECIES PRESENT, SUGGEST RECOLLECTION (A)  Final   Report Status 11/09/2023 FINAL  Final     Labs: BNP (last 3 results) Recent Labs    02/04/23 2205 05/31/23 1506  BNP 29.2 22.1   Basic Metabolic Panel: Recent Labs  Lab 11/07/23 2052 11/09/23 0739 11/12/23 0336 11/13/23 0938  NA 139 142 142 140  K 4.2 3.5 3.4* 3.4*  CL 103 106 107 106  CO2 24 26 27 25   GLUCOSE 181* 113* 119* 147*  BUN 22 10 8  6*  CREATININE 0.85 0.54 0.49 0.53  CALCIUM 9.2 8.8* 9.0 9.1  MG  --   --  2.0 2.0  PHOS  --   --  2.8  --    Liver Function Tests: Recent Labs  Lab 11/08/23 0422 11/13/23 0938  AST 29 29  ALT 18 30  ALKPHOS 87 73  BILITOT 0.3 0.5  PROT 6.3* 6.5  ALBUMIN  3.2* 3.0*   Recent Labs  Lab 11/07/23 2052  LIPASE 42   No results for input(s): "AMMONIA" in the last 168 hours. CBC: Recent Labs  Lab 11/07/23 2052 11/09/23 0739 11/12/23 0336 11/13/23 0938  WBC 13.1* 6.4 6.7 7.1  NEUTROABS 10.3*  --   --  5.1  HGB 13.9 13.4 14.1 13.7  HCT 43.9 41.8 43.8 42.0  MCV 88.5 87.3 86.9 86.4  PLT 191 178 209 174   Cardiac Enzymes: No results for input(s): "CKTOTAL", "CKMB", "CKMBINDEX", "TROPONINI" in the last 168 hours. BNP: Invalid input(s): "POCBNP" CBG: Recent Labs  Lab 11/08/23 0416  GLUCAP 130*   D-Dimer No results for input(s): "DDIMER" in the last 72 hours. Hgb A1c No results for input(s): "HGBA1C" in the last 72 hours. Lipid Profile No results for input(s): "CHOL", "HDL", "LDLCALC", "TRIG", "CHOLHDL", "LDLDIRECT" in the last 72 hours. Thyroid function studies No results for input(s): "TSH", "T4TOTAL", "T3FREE", "THYROIDAB" in the last 72 hours.  Invalid input(s): "FREET3" Anemia work up No results for input(s): "VITAMINB12", "FOLATE", "FERRITIN",  "TIBC", "IRON", "RETICCTPCT" in the last 72 hours. Urinalysis    Component Value Date/Time   COLORURINE YELLOW 11/07/2023 2357   APPEARANCEUR TURBID (A) 11/07/2023 2357   LABSPEC 1.025 11/07/2023 2357   LABSPEC 1.016 08/27/2022 0600   PHURINE 6.0 11/07/2023 2357   GLUCOSEU NEGATIVE 11/07/2023 2357   GLUCOSEU Normal 08/27/2022 0600   HGBUR NEGATIVE 11/07/2023 2357   HGBUR Negative 08/27/2022 0600   BILIRUBINUR NEGATIVE 11/07/2023 2357   BILIRUBINUR negative 08/27/2022 0600   KETONESUR NEGATIVE 11/07/2023 2357   PROTEINUR NEGATIVE 11/07/2023 2357   NITRITE NEGATIVE 11/07/2023 2357   LEUKOCYTESUR SMALL (A) 11/07/2023 2357   Sepsis Labs Recent Labs  Lab 11/07/23 2052 11/09/23 0739 11/12/23 0336 11/13/23 0938  WBC 13.1* 6.4 6.7 7.1   Microbiology Recent Results (from the past 240 hours)  Blood culture (routine x 2)     Status: None (Preliminary result)   Collection Time: 11/08/23  4:22 AM   Specimen: BLOOD RIGHT HAND  Result Value Ref Range Status   Specimen Description BLOOD RIGHT HAND  Final   Special Requests   Final    BOTTLES DRAWN AEROBIC ONLY Blood Culture results may not be optimal due to an inadequate volume of blood received in culture bottles   Culture   Final    NO GROWTH 4 DAYS Performed at [facility], [address], [place], [place] [address]    Report Status PENDING  Incomplete  Blood culture (routine x 2)     Status: None (Preliminary result)   Collection Time: 11/08/23  4:28 AM   Specimen: BLOOD LEFT ARM  Result Value Ref Range Status   Specimen Description BLOOD LEFT ARM  Final   Special Requests   Final    BOTTLES DRAWN AEROBIC AND ANAEROBIC Blood Culture adequate volume   Culture   Final    NO GROWTH 4 DAYS Performed at [facility], [address] [place], [place] [address]    Report Status PENDING  Incomplete  Urine Culture     Status: Abnormal   Collection Time: 11/08/23 11:57 AM   Specimen: Urine, Clean Catch  Result Value  Ref Range Status   Specimen Description URINE, CLEAN CATCH  Final   Special Requests   Final    NONE Performed at [facility], [address], [place], [place] [address]    Culture MULTIPLE SPECIES PRESENT, SUGGEST RECOLLECTION (A)  Final  Report Status 11/09/2023 FINAL  Final     Time coordinating discharge: 35 minutes  SIGNED:   Glade Lloyd, MD  [facility] 11/13/2023, 11:01 AM

## 2023-11-13 NOTE — NC FL2 (Signed)
[employer] MEDICAID FL2 LEVEL OF CARE FORM     IDENTIFICATION  Patient Name: Cheryl Monroe Birthdate: 1935-05-11 Sex: female Admission Date (Current Location): 11/07/2023  [facility] and [place] Number:  Producer, television/film/video and Address:  [facility], [address] [place], [place] [address]      Provider Number: [id number]  Attending Physician Name and Address:  Glade Lloyd, MD  Relative Name and Phone Number:  Hemma, Canel [phone])  [phone] (Work Phone)    Current Level of Care: Hospital Recommended Level of Care: Skilled Nursing Facility Prior Approval Number:    Date Approved/Denied:   PASRR Number:    Discharge Plan: SNF    Current Diagnoses: Patient Active Problem List   Diagnosis Date Noted   UTI (urinary tract infection) 11/08/2023   Sepsis secondary to UTI (HCC) 11/08/2023   Bladder retention 11/08/2023   AAA (abdominal aortic aneurysm) (HCC) 11/08/2023   Hypoalbuminemia 11/08/2023   GERD (gastroesophageal reflux disease) 11/08/2023   Acute UTI 05/31/2023   CAP (community acquired pneumonia) 02/05/2023   Aphasia 02/05/2023   Neurogenic bladder 04/18/2022   Acute metabolic encephalopathy 08/02/2021   Word finding difficulty 02/22/2021   Alzheimer's dementia without behavioral disturbance (HCC) 09/10/2020   History of COVID-19 07/26/2020   Orthostatic dizziness 07/19/2020   Balance problem 07/19/2020   Constipation 07/19/2020   Clitoral irritation 07/19/2020   Asymptomatic bacteriuria 07/19/2020   Urinary frequency 07/19/2020   Urge incontinence 07/19/2020   Weakness of left lower extremity 07/19/2020   Essential hypertension 07/19/2020   Weakness 07/19/2020   Impaired mobility 07/19/2020   Recurrent UTI 07/19/2020   Dementia without behavioral disturbance (HCC) 07/19/2020   Edema 07/19/2020   Spinal stenosis 07/19/2020    Orientation RESPIRATION BLADDER Height & Weight     Self  Normal Incontinent, External catheter  Weight: 163 lb 12.8 oz (74.3 kg) Height:  5\' 2"  (157.5 cm)  BEHAVIORAL SYMPTOMS/MOOD NEUROLOGICAL BOWEL NUTRITION STATUS      Incontinent Diet (regular diet)  AMBULATORY STATUS COMMUNICATION OF NEEDS Skin   Extensive Assist Verbally Normal                       Personal Care Assistance Level of Assistance  Bathing, Feeding, Dressing Bathing Assistance: Limited assistance Feeding assistance: Independent Dressing Assistance: Limited assistance     Functional Limitations Info  Sight, Hearing, Speech Sight Info: Adequate Hearing Info: Adequate Speech Info: Adequate    SPECIAL CARE FACTORS FREQUENCY                       Contractures Contractures Info: Not present    Additional Factors Info  Code Status, Allergies Code Status Info: DNR Allergies Info: sulfa antibiotics           Current Medications (11/13/2023):  This is the current hospital active medication list Current Facility-Administered Medications  Medication Dose Route Frequency Provider Last Rate Last Admin   acetaminophen (TYLENOL) tablet 650 mg  650 mg Oral Q6H PRN Madelyn Flavors A, MD       Or   acetaminophen (TYLENOL) suppository 650 mg  650 mg Rectal Q6H PRN Madelyn Flavors A, MD       acidophilus (RISAQUAD) capsule 1 capsule  1 capsule Oral BID Madelyn Flavors A, MD   1 capsule at 11/13/23 0822   albuterol (PROVENTIL) (2.5 MG/3ML) 0.083% nebulizer solution 2.5 mg  2.5 mg Nebulization Q6H PRN Clydie Braun, MD  amLODipine (NORVASC) tablet 10 mg  10 mg Oral Daily Hanley Ben, Kshitiz, MD   10 mg at 11/13/23 1610   cefTRIAXone (ROCEPHIN) 1 g in sodium chloride 0.9 % 100 mL IVPB  1 g Intravenous Q24H Pierce, Dwayne A, RPH   Stopped at 11/12/23 1243   enoxaparin (LOVENOX) injection 40 mg  40 mg Subcutaneous Q24H Smith, Rondell A, MD   40 mg at 11/13/23 9604   feeding supplement (ENSURE ENLIVE / ENSURE PLUS) liquid 237 mL  237 mL Oral BID BM Khatri, Pardeep, MD   237 mL at 11/13/23 0830   fluconazole  (DIFLUCAN) IVPB 100 mg  100 mg Intravenous Q24H Lodema Hong A, RPH   Stopped at 11/12/23 1411   hydrALAZINE (APRESOLINE) tablet 10 mg  10 mg Oral Q8H PRN Madelyn Flavors A, MD   10 mg at 11/11/23 2129   losartan (COZAAR) tablet 25 mg  25 mg Oral Daily Smith, Rondell A, MD   25 mg at 11/13/23 0822   ondansetron (ZOFRAN) tablet 4 mg  4 mg Oral Q6H PRN Madelyn Flavors A, MD       Or   ondansetron (ZOFRAN) injection 4 mg  4 mg Intravenous Q6H PRN Smith, Rondell A, MD       pantoprazole (PROTONIX) EC tablet 20 mg  20 mg Oral BID Katrinka Blazing, Rondell A, MD   20 mg at 11/13/23 5409   polyethylene glycol (MIRALAX / GLYCOLAX) packet 17 g  17 g Oral Daily PRN Kathlen Mody, MD   17 g at 11/12/23 2126   senna (SENOKOT) tablet 17.2 mg  17.2 mg Oral QHS PRN Kathlen Mody, MD   17.2 mg at 11/12/23 2127   sodium chloride flush (NS) 0.9 % injection 3 mL  3 mL Intravenous Q12H Madelyn Flavors A, MD   3 mL at 11/13/23 8119     Discharge Medications: Please see discharge summary for a list of discharge medications.  Relevant Imaging Results:  Relevant Lab Results:   Additional Information SSN: [id number];  Peter Keyworth Aris Lot, LCSW

## 2023-11-13 NOTE — Progress Notes (Signed)
Physical Therapy Treatment Patient Details Name: Cheryl Monroe MRN: [id number] DOB: [birth year] Today's Date: 11/13/2023   History of Present Illness Cheryl Monroe is a 89 y.o. female who presented for evaluation for diarrhea and reports of decreased appetite; workup revealing UTI; with medical history significant of hypertension, Alzheimer's dementia, aphasia, neurogenic bladder, recurrent UTIs, and osteoarthritis    PT Comments  Pt received in bed and very agreeable to treatment. Pt with minimal verbalization due to aphasia but able to express her thanks for assistance with mobility and being able to wash her face and brush her teeth. Pt needs max A +2 to come to EOB. Tightness noted B hips and knees causing posterior pelvic tilt and posterior bias EOB. Min A needed for safety. Pt maintained sitting >20 mins. Continue to recommend return to [facility] at d/ c.      If plan is discharge home, recommend the following: A lot of help with walking and/or transfers;A lot of help with bathing/dressing/bathroom;Assistance with feeding;Assist for transportation   Can travel by private vehicle     No  Equipment Recommendations  None recommended by PT    Recommendations for Other Services       Precautions / Restrictions Precautions Precautions: Fall Precaution Comments: 'Enteric prec Restrictions Weight Bearing Restrictions Per Provider Order: No     Mobility  Bed Mobility Overal bed mobility: Needs Assistance Bed Mobility: Rolling, Sidelying to Sit, Sit to Supine Rolling: Max assist Sidelying to sit: +2 for physical assistance, Max assist   Sit to supine: +2 for physical assistance, Total assist   General bed mobility comments: pt with increased stiffness BLE's, minimal knee flexion to bridge knees. Max A at LE's to sit up as well as trunk. Pt dependent for return to supine    Transfers                   General transfer comment: deferred, pt hoyer at baseline and  supposed to d/c back to [facility] today    Ambulation/Gait               General Gait Details: unable at baseline   Stairs             Wheelchair Mobility     Tilt Bed    Modified Rankin (Stroke Patients Only)       Balance Overall balance assessment: Needs assistance Sitting-balance support: No upper extremity supported, Feet supported Sitting balance-Leahy Scale: Poor Sitting balance - Comments: posterior bias in sitting and pt has difficulty shifting trunk fwd. Tight through hips and maintained in mild extension and knees unable to maintain 90 deg flex. Needed min A to safely sit OB. Tolerated 20 mins for washing face, brushing teeth, and LE ROM Postural control: Posterior lean     Standing balance comment: unable                            Cognition Arousal: Alert Behavior During Therapy: WFL for tasks assessed/performed Overall Cognitive Status: History of cognitive impairments - at baseline                                 General Comments: Likely at baseline; answers questions appropriately; noted pt using gestures and pointing at times        Exercises Total Joint Exercises Knee Flexion: AAROM, Both, 10 reps, Seated General Exercises - Lower  Extremity Long Arc Quad: AAROM, Both, 10 reps, Seated    General Comments General comments (skin integrity, edema, etc.): Pt very appreciative of mobilizing and being able to perform self care      Pertinent Vitals/Pain Pain Assessment Pain Assessment: No/denies pain    Home Living                          Prior Function            PT Goals (current goals can now be found in the care plan section) Acute Rehab PT Goals Patient Stated Goal: go back home PT Goal Formulation: Patient unable to participate in goal setting Time For Goal Achievement: 11/23/23 Potential to Achieve Goals: Fair Progress towards PT goals: Progressing toward goals    Frequency     Min 1X/week      PT Plan      Co-evaluation              AM-PAC PT "6 Clicks" Mobility   Outcome Measure  Help needed turning from your back to your side while in a flat bed without using bedrails?: A Lot Help needed moving from lying on your back to sitting on the side of a flat bed without using bedrails?: Total Help needed moving to and from a bed to a chair (including a wheelchair)?: Total Help needed standing up from a chair using your arms (e.g., wheelchair or bedside chair)?: Total Help needed to walk in hospital room?: Total Help needed climbing 3-5 steps with a railing? : Total 6 Click Score: 7    End of Session   Activity Tolerance: Patient tolerated treatment well Patient left: in bed;with call bell/phone within reach;with bed alarm set Nurse Communication: Mobility status PT Visit Diagnosis: Other abnormalities of gait and mobility (R26.89);Muscle weakness (generalized) (M62.81)     Time: 4098-1191 PT Time Calculation (min) (ACUTE ONLY): 25 min  Charges:    $Therapeutic Activity: 23-37 mins PT General Charges $$ ACUTE PT VISIT: 1 Visit                     Lyanne Co, PT  Acute Rehab Services Secure chat preferred Office [phone]    Lawana Chambers Minyon Billiter 11/13/2023, 11:23 AM

## 2023-11-14 ENCOUNTER — Encounter: Payer: Self-pay | Admitting: Adult Health

## 2023-11-14 NOTE — Progress Notes (Signed)
Location:  Medical illustrator of Service:  SNF (31) Provider:   Peggye Ley, ANP [facility] [phone]   Mahlon Gammon, MD  Patient Care Team: Mahlon Gammon, MD as PCP - General (Internal Medicine) Jamison Neighbor, MD (Urology)  Extended Emergency Contact Information Primary Emergency Contact: Pasquarella,[name] Address: [address]          [place], [place] [address] [place] Home Phone: [phone] Work Phone: [phone] Relation: Son Secondary Emergency Contact: Vorndran,Prachi  [place] Mobile Phone: [phone] Relation: Daughter  Code Status:  DNR Goals of care: Advanced Directive information    11/08/2023   10:00 PM  Advanced Directives  Does Patient Have a Medical Advance Directive? Yes  Type of Advance Directive Out of facility DNR (pink MOST or yellow form)  Does patient want to make changes to medical advance directive? No - Patient declined     Chief Complaint  Patient presents with   Medical Management of Chronic Issues    HPI:  Pt is a 89 y.o. female seen today for a hospital f/u s/p admission from 11/07/23-11/13/23.  PMH significant for Alz dementia with aphasia, labile blood pressure, spinal stenosis, bladder retention, recurrent UTI, atrophic vaginitis, OA, constipation,. She presented with decreased appetite and diarrhea and lethargy. Sepsis was diagnosed on admission due to UTI.  She was given Rocephin and Diflucan IV. Urine culture grew multiple organisms. Blood cultures were negative. Lactic acid was elevated but later normalized. Same for WBCs. Taken off norvasc due to edema. Diarrhea resolved. Hypokalemia was replenished. K 3.4 on discharge. Ct scan of the abd showed Dilatation of the ascending thoracic aorta 4 cm in diameter and also mild UPJ obstruction as well as aortic atherosclerosis.   Upon return she was able to request food. Other wise was comfortable, tired and  did not make further verbalizations. She is close to her baseline in terms of physical function. She uses a lift for transfers, needs assistance with all ADLs and has incontinence. Does appear generally weak.   Past Medical History:  Diagnosis Date   Alzheimer disease (HCC)    Balance problem 07/19/2020   Constipation 07/19/2020   Dementia without behavioral disturbance (HCC) 07/19/2020   MMSE 21/30 07/21/20   Fall    Osteoarthritis    Pyelonephritis    Spinal stenosis 07/19/2020   Weakness of left lower extremity 07/19/2020   History reviewed. No pertinent surgical history.  Allergies  Allergen Reactions   Sulfa Antibiotics Rash    Outpatient Encounter Medications as of 11/13/2023  Medication Sig   Acetaminophen (TYLENOL DISSOLVE PACKS) 500 MG PACK Take 2 packets by mouth daily.   Cranberry (THERACRAN PO) Take 250 mg by mouth 2 (two) times daily.   hydrALAZINE (APRESOLINE) 10 MG tablet Take 10 mg by mouth every 8 (eight) hours as needed (SBP > 170).   losartan (COZAAR) 25 MG tablet Take 25 mg by mouth daily.   nitrofurantoin (MACRODANTIN) 50 MG capsule Take 1 capsule (50 mg total) by mouth 2 (two) times daily.   pantoprazole (PROTONIX) 20 MG tablet Take 1 tablet (20 mg total) by mouth 2 (two) times daily.   polyethylene glycol (MIRALAX / GLYCOLAX) 17 g packet Take 17 g by mouth every other day.   senna (SENOKOT) 8.6 MG TABS tablet Take 17.2 mg by mouth every evening.   No facility-administered encounter medications on file as of 11/13/2023.    Review of  Systems  Unable to perform ROS: Dementia    Immunization History  Administered Date(s) Administered   Fluad Quad(high Dose 65+) 08/12/2023   Influenza, High Dose Seasonal PF 07/25/2021   Influenza-Unspecified 08/05/2018, 09/29/2018, 07/02/2019, 08/21/2019, 08/11/2020, 07/26/2022   Moderna Covid-19 Vaccine Bivalent Booster 7yrs & up 08/01/2021, 08/26/2022, 02/13/2023   Moderna SARS-COV2 Booster Vaccination 08/31/2020,  06/12/2021, 08/12/2023   Moderna Sars-Covid-2 Vaccination 11/04/2019, 12/02/2019   Pneumococcal Conjugate-13 09/28/2018   Pneumococcal Polysaccharide-23 10/21/2017   Tdap 02/23/2021   Zoster Recombinant(Shingrix) 05/24/2021, 07/24/2021   Pertinent  Health Maintenance Due  Topic Date Due   INFLUENZA VACCINE  Completed   DEXA SCAN  Discontinued      06/03/2022    3:33 PM 10/01/2022    1:09 PM 11/05/2022   10:43 AM 12/05/2022    1:51 PM 02/17/2023    8:50 AM  Fall Risk  Falls in the past year?  0 0 0 0  Was there an injury with Fall?  0 0 0 0  Fall Risk Category Calculator  0 0 0 0  Fall Risk Category (Retired)  Low     (RETIRED) Patient Fall Risk Level High fall risk High fall risk     Patient at Risk for Falls Due to  History of fall(s) History of fall(s) History of fall(s) No Fall Risks  Fall risk Follow up  Falls evaluation completed Falls evaluation completed Falls evaluation completed Falls evaluation completed   Functional Status Survey:    Vitals:   11/14/23 0901  BP: (!) 150/72  Pulse: 65  Resp: 20  Temp: (!) 97.2 F (36.2 C)  SpO2: 94%   There is no height or weight on file to calculate BMI. Physical Exam Vitals and nursing note reviewed.  Constitutional:      General: She is not in acute distress.    Appearance: She is not diaphoretic.  HENT:     Head: Normocephalic and atraumatic.     Nose: Nose normal.     Mouth/Throat:     Mouth: Mucous membranes are moist.     Pharynx: Oropharynx is clear.  Neck:     Vascular: No JVD.  Cardiovascular:     Rate and Rhythm: Normal rate and regular rhythm.     Heart sounds: No murmur heard. Pulmonary:     Effort: Pulmonary effort is normal. No respiratory distress.     Breath sounds: Wheezing (scattered exp mild) present.  Abdominal:     General: Bowel sounds are normal. There is no distension.     Palpations: Abdomen is soft.     Tenderness: There is no abdominal tenderness.  Musculoskeletal:     Comments:  Generalized edema  Skin:    General: Skin is warm and dry.  Neurological:     General: No focal deficit present.     Mental Status: She is alert. Mental status is at baseline.  Psychiatric:        Mood and Affect: Mood normal.     Labs reviewed: Recent Labs    06/03/23 0212 06/10/23 0000 11/09/23 0739 11/12/23 0336 11/13/23 0938  NA  --    < > 142 142 140  K  --    < > 3.5 3.4* 3.4*  CL  --    < > 106 107 106  CO2  --    < > 26 27 25   GLUCOSE  --    < > 113* 119* 147*  BUN  --    < >  10 8 6*  CREATININE  --    < > 0.54 0.49 0.53  CALCIUM  --    < > 8.8* 9.0 9.1  MG 1.9  --   --  2.0 2.0  PHOS 3.4  --   --  2.8  --    < > = values in this interval not displayed.   Recent Labs    06/24/23 1714 11/08/23 0422 11/13/23 0938  AST 25 29 29   ALT 41 18 30  ALKPHOS 93 87 73  BILITOT 0.7 0.3 0.5  PROT 7.3 6.3* 6.5  ALBUMIN 3.3* 3.2* 3.0*   Recent Labs    06/24/23 1714 11/07/23 2052 11/09/23 0739 11/12/23 0336 11/13/23 0938  WBC 11.6* 13.1* 6.4 6.7 7.1  NEUTROABS 8.7* 10.3*  --   --  5.1  HGB 13.2 13.9 13.4 14.1 13.7  HCT 41.9 43.9 41.8 43.8 42.0  MCV 89.5 88.5 87.3 86.9 86.4  PLT 255 191 178 209 174   Lab Results  Component Value Date   TSH 1.386 11/08/2023   No results found for: "HGBA1C" Lab Results  Component Value Date   CHOL 235 (A) 07/20/2020   HDL 100 (A) 07/20/2020   LDLCALC 134 07/20/2020   TRIG 102 07/20/2020    Significant Diagnostic Results in last 30 days:  CT ABDOMEN PELVIS W CONTRAST Result Date: 11/08/2023 CLINICAL DATA:  Bowel obstruction EXAM: CT ABDOMEN AND PELVIS WITH CONTRAST TECHNIQUE: Multidetector CT imaging of the abdomen and pelvis was performed using the standard protocol following bolus administration of intravenous contrast. RADIATION DOSE REDUCTION: This exam was performed according to the departmental dose-optimization program which includes automated exposure control, adjustment of the mA and/or kV according to patient size  and/or use of iterative reconstruction technique. CONTRAST:  75mL OMNIPAQUE IOHEXOL 350 MG/ML SOLN COMPARISON:  None Available. FINDINGS: Lower chest: Extensive multi-vessel coronary artery calcification. Global cardiac size within normal limits. There is dilation of the ascending thoracic aorta which measures at least 4.0 cm in diameter and is incompletely visualized on this examination. This is similar, however, to prior examination 02/04/2023. Bibasilar dependent atelectasis. Hepatobiliary: Evaluation of the upper abdominal solid organs is limited by respiratory motion artifact. Allowing for this, the liver and gallbladder are unremarkable. No intra or extrahepatic biliary ductal dilation. Pancreas: Unremarkable Spleen: Unremarkable Adrenals/Urinary Tract: The adrenal glands are unremarkable. Mild asymmetric right renal cortical atrophy. The kidneys are normal in position. Mild asymmetric dilation of the right renal pelvis with decompression of the right ureter suggests a mild UPJ obstruction. No intrarenal or ureteral calculi. No hydronephrosis on the left. The bladder is unremarkable. Stomach/Bowel: Stomach is within normal limits. Appendix appears normal. No evidence of bowel wall thickening, distention, or inflammatory changes. Vascular/Lymphatic: There is dilation of the left gonadal vein with bilateral adnexal varices identified suggesting changes of ovarian vein reflux and pelvic venous insufficiency. Moderate atherosclerotic calcification within the abdominal aorta. No aortic aneurysm. No pathologic adenopathy within the abdomen and pelvis. Reproductive: Pessary in place. Pelvic organs are otherwise unremarkable. Other: Diastasis of the rectus abdominus musculature noted. No abdominopelvic ascites Musculoskeletal: The osseous structures are age-appropriate. No acute bone abnormality IMPRESSION: 1. No acute intra-abdominal pathology identified. No definite radiographic explanation for the patient's  reported symptoms. 2. Extensive multi-vessel coronary artery calcification. 3. Dilation of the ascending thoracic aorta which measures at least 4.0 cm in diameter and is incompletely visualized on this examination. This is similar, however, to prior examination 02/04/2023. Follow-up recommendations are as outlined on prior  examination. 4. 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 5. Dilation of the left gonadal vein with bilateral adnexal varices identified suggesting changes of ovarian vein reflux and pelvic venous insufficiency. Aortic Atherosclerosis (ICD10-I70.0). Electronically Signed   By: Helyn Numbers M.D.   On: 11/08/2023 01:21    Assessment/Plan  1. Sepsis secondary to UTI (HCC) (Primary) Resolved  2. Recurrent UTI macrodantin  3. Slow transit constipation Continue miralax and senokot   4. Labile hypertension Off norvasc due to edema Continue losartan and prn hydralazine  5. Weakness PT OT eval and tx  6. Late onset Alzheimer's dementia without behavioral disturbance (HCC) Progressive decline in cognition and physical function c/w the disease. Continue supportive care in the skilled environment.  7. Spinal stenosis, unspecified spinal region On tylenol   8. Abdominal aortic aneurysm (AAA) without rupture, unspecified part (HCC) 4cm Recommended f/u per imaging study but given her age/dementia would need to consider risk vs benefit.    Family/ staff Communication: nurse  Labs/tests ordered:  NA

## 2023-11-17 ENCOUNTER — Encounter: Payer: Self-pay | Admitting: Internal Medicine

## 2023-11-17 ENCOUNTER — Non-Acute Institutional Stay (SKILLED_NURSING_FACILITY): Payer: Medicare Other | Admitting: Internal Medicine

## 2023-11-17 DIAGNOSIS — F0280 Dementia in other diseases classified elsewhere without behavioral disturbance: Secondary | ICD-10-CM

## 2023-11-17 DIAGNOSIS — N390 Urinary tract infection, site not specified: Secondary | ICD-10-CM

## 2023-11-17 DIAGNOSIS — I77810 Thoracic aortic ectasia: Secondary | ICD-10-CM

## 2023-11-17 DIAGNOSIS — G301 Alzheimer's disease with late onset: Secondary | ICD-10-CM

## 2023-11-17 DIAGNOSIS — R0989 Other specified symptoms and signs involving the circulatory and respiratory systems: Secondary | ICD-10-CM | POA: Diagnosis not present

## 2023-11-17 DIAGNOSIS — R600 Localized edema: Secondary | ICD-10-CM | POA: Diagnosis not present

## 2023-11-17 DIAGNOSIS — Z7189 Other specified counseling: Secondary | ICD-10-CM

## 2023-11-17 NOTE — Progress Notes (Unsigned)
Provider:   Location:  [facility] Room Number: 140A Place of Service:  SNF (31)  PCP: Mahlon Gammon, MD Patient Care Team: Mahlon Gammon, MD as PCP - General (Internal Medicine) Jamison Neighbor, MD (Urology)  Extended Emergency Contact Information Primary Emergency Contact: Philley,[name] Address: [address]          [place], [place] [address] [place] Home Phone: [phone] Work Phone: [phone] Relation: Son Secondary Emergency Contact: Keough,Prachi  [place] Mobile Phone: [phone] Relation: Daughter  Code Status: DNR Goals of Care: Advanced Directive information    11/17/2023   11:39 AM  Advanced Directives  Does Patient Have a Medical Advance Directive? Yes  Type of Advance Directive Out of facility DNR (pink MOST or yellow form)  Does patient want to make changes to medical advance directive? No - Patient declined      Chief Complaint  Patient presents with   New REAdmit To SNF    Patient is being seen for Re admit    HPI: Patient is a 89 y.o. female seen today for Readmission to SNF  Admitted in the hospital from 1/17 through 1/23 for possible sepsis . Lives in SNF in [place]   Patient has a history of Alzheimer's dementia with Aphasia, hypertension, orthostatic hypotension She also has history of recurrent UTIs and urinary incontinence  Also h/o Orthostatic BP with Wide variation SBP Can vary from 170-90 H/o Upper GI Bleed On Protonix  Patient was sent to the hospital for decreased appetite, diarrhea and lethargy.  On admission she was diagnosed with sepsis.  Her lactic acid was elevated and she had leukocytosis.  She was treated for UTI with Rocephin and Diflucan.  Her urine culture ultimately grew multiple species.  She also had a CT scan done of her abdomen showed no acute pathology.  She does have a dilatation of ascending thoracic aorta 4 cm And mild UPJ  obstruction Her blood cultures were negative for any growth.  She was taken off amlodipine due to swelling. Patient was discharged to the facility at her baseline Patient continues to be a little weak.  But seem to be at her baseline today.  No shortness of breath.  Does have aphasia  She stays in mostly Wheelchair now  Past Medical History:  Diagnosis Date   Alzheimer disease [facility])    Balance problem 07/19/2020   Constipation 07/19/2020   Dementia without behavioral disturbance (HCC) 07/19/2020   MMSE 21/30 07/21/20   Fall    Osteoarthritis    Pyelonephritis    Spinal stenosis 07/19/2020   Weakness of left lower extremity 07/19/2020   History reviewed. No pertinent surgical history.  reports that she has never smoked. She has never used smokeless tobacco. She reports that she does not currently use alcohol. She reports that she does not currently use drugs. Social History   Socioeconomic History   Marital status: Legally Separated    Spouse name: Not on file   Number of children: 3   Years of education: Not on file   Highest education level: Some college, no degree  Occupational History   Not on file  Tobacco Use   Smoking status: Never   Smokeless tobacco: Never  Vaping Use   Vaping status: Never Used  Substance and Sexual Activity   Alcohol use: Not Currently   Drug use: Not Currently   Sexual activity: Not on file  Other Topics Concern  Not on file  Social History Narrative   Not on file   Social Drivers of Health   Financial Resource Strain: Not on file  Food Insecurity: No Food Insecurity (11/08/2023)   Hunger Vital Sign    Worried About Running Out of Food in the Last Year: Never true    Ran Out of Food in the Last Year: Never true  Transportation Needs: No Transportation Needs (11/08/2023)   PRAPARE - Administrator, Civil Service (Medical): No    Lack of Transportation (Non-Medical): No  Physical Activity: Not on file  Stress: Not on file   Social Connections: Patient Unable To Answer (11/08/2023)   Social Connection and Isolation Panel [NHANES]    Frequency of Communication with Friends and Family: Patient unable to answer    Frequency of Social Gatherings with Friends and Family: Patient unable to answer    Attends Religious Services: Patient unable to answer    Active Member of Clubs or Organizations: Patient unable to answer    Attends Banker Meetings: Patient unable to answer    Marital Status: Patient unable to answer  Intimate Partner Violence: Not At Risk (11/08/2023)   Humiliation, Afraid, Rape, and Kick questionnaire    Fear of Current or Ex-Partner: No    Emotionally Abused: No    Physically Abused: No    Sexually Abused: No    Functional Status Survey:    Family History  Problem Relation Age of Onset   ALS Mother     Health Maintenance  Topic Date Due   COVID-19 Vaccine (9 - 2024-25 season) 10/07/2023   Medicare Annual Wellness (AWV)  11/23/2023   DTaP/Tdap/Td (2 - Td or Tdap) 02/24/2031   Pneumonia Vaccine 69+ Years old  Completed   INFLUENZA VACCINE  Completed   Zoster Vaccines- Shingrix  Completed   HPV VACCINES  Aged Out   DEXA SCAN  Discontinued    Allergies  Allergen Reactions   Sulfa Antibiotics Rash    Outpatient Encounter Medications as of 11/17/2023  Medication Sig   Acetaminophen (TYLENOL DISSOLVE PACKS) 500 MG PACK Take 2 packets by mouth daily.   Cranberry (THERACRAN PO) Take 250 mg by mouth 2 (two) times daily.   hydrALAZINE (APRESOLINE) 10 MG tablet Take 10 mg by mouth every 8 (eight) hours as needed (SBP > 170).   losartan (COZAAR) 25 MG tablet Take 25 mg by mouth daily.   nitrofurantoin (MACRODANTIN) 50 MG capsule Take 1 capsule (50 mg total) by mouth 2 (two) times daily.   pantoprazole (PROTONIX) 20 MG tablet Take 1 tablet (20 mg total) by mouth 2 (two) times daily.   polyethylene glycol (MIRALAX / GLYCOLAX) 17 g packet Take 17 g by mouth every other day.    senna (SENOKOT) 8.6 MG TABS tablet Take 17.2 mg by mouth every evening.   No facility-administered encounter medications on file as of 11/17/2023.    Review of Systems Dementia  Vitals:   11/17/23 1134  BP: (!) 142/82  Pulse: 67  Resp: 17  Temp: 98 F (36.7 C)  TempSrc: Temporal  SpO2: 97%  Weight: 164 lb 4.8 oz (74.5 kg)  Height: 5\' 2"  (1.575 m)   Body mass index is 30.05 kg/m. Physical Exam Vitals reviewed.  Constitutional:      Appearance: Normal appearance.  HENT:     Head: Normocephalic.     Nose: Nose normal.     Mouth/Throat:     Mouth: Mucous membranes are moist.  Pharynx: Oropharynx is clear.  Eyes:     Pupils: Pupils are equal, round, and reactive to light.  Cardiovascular:     Rate and Rhythm: Normal rate and regular rhythm.     Pulses: Normal pulses.     Heart sounds: Normal heart sounds. No murmur heard. Pulmonary:     Effort: Pulmonary effort is normal.     Breath sounds: Normal breath sounds.  Abdominal:     General: Abdomen is flat. Bowel sounds are normal.     Palpations: Abdomen is soft.  Musculoskeletal:        General: Swelling present.     Cervical back: Neck supple.  Skin:    General: Skin is warm.  Neurological:     General: No focal deficit present.     Mental Status: She is alert and oriented to person, place, and time.  Psychiatric:        Mood and Affect: Mood normal.        Thought Content: Thought content normal.     Labs reviewed: Basic Metabolic Panel: Recent Labs    06/03/23 0212 06/10/23 0000 11/09/23 0739 11/12/23 0336 11/13/23 0938  NA  --    < > 142 142 140  K  --    < > 3.5 3.4* 3.4*  CL  --    < > 106 107 106  CO2  --    < > 26 27 25   GLUCOSE  --    < > 113* 119* 147*  BUN  --    < > 10 8 6*  CREATININE  --    < > 0.54 0.49 0.53  CALCIUM  --    < > 8.8* 9.0 9.1  MG 1.9  --   --  2.0 2.0  PHOS 3.4  --   --  2.8  --    < > = values in this interval not displayed.   Liver Function Tests: Recent Labs     06/24/23 1714 11/08/23 0422 11/13/23 0938  AST 25 29 29   ALT 41 18 30  ALKPHOS 93 87 73  BILITOT 0.7 0.3 0.5  PROT 7.3 6.3* 6.5  ALBUMIN 3.3* 3.2* 3.0*   Recent Labs    06/24/23 1714 11/07/23 2052  LIPASE 26 42   Recent Labs    02/15/23 1448  AMMONIA 12   CBC: Recent Labs    06/24/23 1714 11/07/23 2052 11/09/23 0739 11/12/23 0336 11/13/23 0938  WBC 11.6* 13.1* 6.4 6.7 7.1  NEUTROABS 8.7* 10.3*  --   --  5.1  HGB 13.2 13.9 13.4 14.1 13.7  HCT 41.9 43.9 41.8 43.8 42.0  MCV 89.5 88.5 87.3 86.9 86.4  PLT 255 191 178 209 174   Cardiac Enzymes: No results for input(s): "CKTOTAL", "CKMB", "CKMBINDEX", "TROPONINI" in the last 8760 hours. BNP: Invalid input(s): "POCBNP" No results found for: "HGBA1C" Lab Results  Component Value Date   TSH 1.386 11/08/2023   Lab Results  Component Value Date   VITAMINB12 479 07/20/2020   No results found for: "FOLATE" Lab Results  Component Value Date   IRON 60 09/27/37   TIBC 321 12-29-36   FERRITIN 67.22 02-Nov-1934    Imaging and Procedures obtained prior to SNF admission: CT ABDOMEN PELVIS W CONTRAST Result Date: 11/08/2023 CLINICAL DATA:  Bowel obstruction EXAM: CT ABDOMEN AND PELVIS WITH CONTRAST TECHNIQUE: Multidetector CT imaging of the abdomen and pelvis was performed using the standard protocol following bolus administration of intravenous contrast. RADIATION DOSE  REDUCTION: This exam was performed according to the departmental dose-optimization program which includes automated exposure control, adjustment of the mA and/or kV according to patient size and/or use of iterative reconstruction technique. CONTRAST:  75mL OMNIPAQUE IOHEXOL 350 MG/ML SOLN COMPARISON:  None Available. FINDINGS: Lower chest: Extensive multi-vessel coronary artery calcification. Global cardiac size within normal limits. There is dilation of the ascending thoracic aorta which measures at least 4.0 cm in diameter and is incompletely visualized on  this examination. This is similar, however, to prior examination 02/04/2023. Bibasilar dependent atelectasis. Hepatobiliary: Evaluation of the upper abdominal solid organs is limited by respiratory motion artifact. Allowing for this, the liver and gallbladder are unremarkable. No intra or extrahepatic biliary ductal dilation. Pancreas: Unremarkable Spleen: Unremarkable Adrenals/Urinary Tract: The adrenal glands are unremarkable. Mild asymmetric right renal cortical atrophy. The kidneys are normal in position. Mild asymmetric dilation of the right renal pelvis with decompression of the right ureter suggests a mild UPJ obstruction. No intrarenal or ureteral calculi. No hydronephrosis on the left. The bladder is unremarkable. Stomach/Bowel: Stomach is within normal limits. Appendix appears normal. No evidence of bowel wall thickening, distention, or inflammatory changes. Vascular/Lymphatic: There is dilation of the left gonadal vein with bilateral adnexal varices identified suggesting changes of ovarian vein reflux and pelvic venous insufficiency. Moderate atherosclerotic calcification within the abdominal aorta. No aortic aneurysm. No pathologic adenopathy within the abdomen and pelvis. Reproductive: Pessary in place. Pelvic organs are otherwise unremarkable. Other: Diastasis of the rectus abdominus musculature noted. No abdominopelvic ascites Musculoskeletal: The osseous structures are age-appropriate. No acute bone abnormality IMPRESSION: 1. No acute intra-abdominal pathology identified. No definite radiographic explanation for the patient's reported symptoms. 2. Extensive multi-vessel coronary artery calcification. 3. Dilation of the ascending thoracic aorta which measures at least 4.0 cm in diameter and is incompletely visualized on this examination. This is similar, however, to prior examination 02/04/2023. Follow-up recommendations are as outlined on prior examination. 4. Mild asymmetric right renal cortical  atrophy with mild UPJ obstruction. 5. Dilation of the left gonadal vein with bilateral adnexal varices identified suggesting changes of ovarian vein reflux and pelvic venous insufficiency. Aortic Atherosclerosis (ICD10-I70.0). Electronically Signed   By: Helyn Numbers M.D.   On: 11/08/2023 01:21    Assessment/Plan 1. Labile hypertension (Primary) Her BP seems controlled oN Losartan Will continue to monoitor She is off norvasc  2. Recurrent UTI Possible UPJ obstruction on CT D/W Dr Lestine Mount Patient had Urology Eval 2 years ago Due to her Worsening Cogniton will hold off work up  [employer], Estrogen  3. Bilateral leg edema Lasix 20 mg 3/weel for 1 week Potassium 10 meq every day for 1 week Repeat BMP in 1 week  4. 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 Aphasia Worse Continue SNF support 5. Dilatation of thoracic aorta (HCC) Discussed wit POA No Follo wup fo rnow 6 h/o GI bleed Protonix 6. Goals of care, counseling/discussion Will Contineu to provide Palliattiva care in SNF Will hold off Referral to Palliative for now    Family/ staff Communication:   Labs/tests ordered:

## 2023-11-19 ENCOUNTER — Encounter: Payer: Self-pay | Admitting: Orthopedic Surgery

## 2023-11-19 ENCOUNTER — Non-Acute Institutional Stay (SKILLED_NURSING_FACILITY): Payer: Medicare Other | Admitting: Orthopedic Surgery

## 2023-11-19 DIAGNOSIS — F0390 Unspecified dementia without behavioral disturbance: Secondary | ICD-10-CM | POA: Diagnosis not present

## 2023-11-19 DIAGNOSIS — R29818 Other symptoms and signs involving the nervous system: Secondary | ICD-10-CM

## 2023-11-19 DIAGNOSIS — I10 Essential (primary) hypertension: Secondary | ICD-10-CM

## 2023-11-19 DIAGNOSIS — R0602 Shortness of breath: Secondary | ICD-10-CM | POA: Diagnosis not present

## 2023-11-19 NOTE — Progress Notes (Signed)
Location:  [facility] Room Number: 140/A Place of Service:  SNF [phone]) Provider:  Octavia Heir, NP   Mahlon Gammon, MD  Patient Care Team: Mahlon Gammon, MD as PCP - General (Internal Medicine) Jamison Neighbor, MD (Urology)  Extended Emergency Contact Information Primary Emergency Contact: Zilberman,[name] Address: [address]          [place], [place] [address] [place] Home Phone: [phone] Work Phone: [phone] Relation: Son Secondary Emergency Contact: Bramlett,Prachi  [place] Mobile Phone: [phone] Relation: Daughter  Code Status:  DNR Goals of care: Advanced Directive information    11/17/2023   11:39 AM  Advanced Directives  Does Patient Have a Medical Advance Directive? Yes  Type of Advance Directive Out of facility DNR (pink MOST or yellow form)  Does patient want to make changes to medical advance directive? No - Patient declined     Chief Complaint  Patient presents with   Shortness of Breath    HPI:  Pt is a 89 y.o. female seen today for acute visit due to shortness of breath.   She currently resides on the skilled nursing unit at [facility] due to Alzheimer's dementia. Past medical history includes: HTN (SBP > 170), orthostatic bp, PNA with hospitalization 01/2023, recurrent UTI, constipation, neurogenic bladder and spinal stenosis.   Poor historian due to dementia. Nursing reports increased respirations and use of accessory muscles to breathe last night. O2 sat were 84-90% on RA. O2 sats improved with 2 liters oxygen, > 90%. On call was notified and CXR was ordered. CXR indicated left pleural effusion and LLL pneumonia. At this time she denies shortness of breath or chest pain. No cough observed per nursing. Afebrile. H/o dysphagia, remains on chopped DYS 3 diet. O2 sats 99 % on 2 liters, HR 70. I removed oxygen x 15 minutes, rechecked O2 sat 94%, HR 70. I did note mild  wheezing on exam, but lung sounds were otherwise clear. Nursing did give her albuterol nebulizer prior to exam. Hospitalized 01/17-01/23 for sepsis secondary to UTI. She was treated with Rocephin and Diflucan IV. Discharge summary recommend ED evaluation if condition worsens. Family considering palliative consult. Treatment options discussed with son. He would like to continue observation at this time.      Past Medical History:  Diagnosis Date   Alzheimer disease (HCC)    Balance problem 07/19/2020   Constipation 07/19/2020   Dementia without behavioral disturbance (HCC) 07/19/2020   MMSE 21/30 07/21/20   Fall    Osteoarthritis    Pyelonephritis    Spinal stenosis 07/19/2020   Weakness of left lower extremity 07/19/2020   No past surgical history on file.  Allergies  Allergen Reactions   Sulfa Antibiotics Rash    Outpatient Encounter Medications as of 11/19/2023  Medication Sig   Acetaminophen (TYLENOL DISSOLVE PACKS) 500 MG PACK Take 2 packets by mouth daily.   Cranberry (THERACRAN PO) Take 250 mg by mouth 2 (two) times daily.   estradiol (ESTRING) 7.5 MCG/24HR vaginal ring Place 1 each vaginally every 3 (three) months. follow package directions   hydrALAZINE (APRESOLINE) 10 MG tablet Take 10 mg by mouth every 8 (eight) hours as needed (SBP > 170).   losartan (COZAAR) 25 MG tablet Take 25 mg by mouth daily.   nitrofurantoin (MACRODANTIN) 50 MG capsule Take 1 capsule (50 mg total) by mouth 2 (two) times daily.   pantoprazole (PROTONIX) 20 MG tablet Take  1 tablet (20 mg total) by mouth 2 (two) times daily.   polyethylene glycol (MIRALAX / GLYCOLAX) 17 g packet Take 17 g by mouth every other day.   senna (SENOKOT) 8.6 MG TABS tablet Take 17.2 mg by mouth every evening.   No facility-administered encounter medications on file as of 11/19/2023.    Review of Systems  Unable to perform ROS: Dementia    Immunization History  Administered Date(s) Administered   Fluad Quad(high Dose  65+) 08/12/2023   Influenza, High Dose Seasonal PF 07/25/2021   Influenza-Unspecified 08/05/2018, 09/29/2018, 07/02/2019, 08/21/2019, 08/11/2020, 07/26/2022   Moderna Covid-19 Vaccine Bivalent Booster 14yrs & up 08/01/2021, 08/26/2022, 02/13/2023   Moderna SARS-COV2 Booster Vaccination 08/31/2020, 06/12/2021, 08/12/2023   Moderna Sars-Covid-2 Vaccination 11/04/2019, 12/02/2019   Pneumococcal Conjugate-13 09/28/2018   Pneumococcal Polysaccharide-23 10/21/2017   Tdap 02/23/2021   Zoster Recombinant(Shingrix) 05/24/2021, 07/24/2021   Pertinent  Health Maintenance Due  Topic Date Due   INFLUENZA VACCINE  Completed   DEXA SCAN  Discontinued      06/03/2022    3:33 PM 10/01/2022    1:09 PM 11/05/2022   10:43 AM 12/05/2022    1:51 PM 02/17/2023    8:50 AM  Fall Risk  Falls in the past year?  0 0 0 0  Was there an injury with Fall?  0 0 0 0  Fall Risk Category Calculator  0 0 0 0  Fall Risk Category (Retired)  Low     (RETIRED) Patient Fall Risk Level High fall risk High fall risk     Patient at Risk for Falls Due to  History of fall(s) History of fall(s) History of fall(s) No Fall Risks  Fall risk Follow up  Falls evaluation completed Falls evaluation completed Falls evaluation completed Falls evaluation completed   Functional Status Survey:    Vitals:   11/19/23 1316  BP: (!) 164/82  Pulse: 70  Resp: 19  Temp: (!) 97.2 F (36.2 C)  SpO2: 99%  Weight: 164 lb 4.8 oz (74.5 kg)  Height: 5\' 2"  (1.575 m)   Body mass index is 30.05 kg/m. Physical Exam Vitals reviewed.  HENT:     Head: Normocephalic.     Mouth/Throat:     Mouth: Mucous membranes are moist.  Eyes:     Pupils: Pupils are equal, round, and reactive to light.  Cardiovascular:     Rate and Rhythm: Normal rate and regular rhythm.     Pulses: Normal pulses.     Heart sounds: Normal heart sounds.  Pulmonary:     Effort: Pulmonary effort is normal. No tachypnea or accessory muscle usage.     Breath sounds:  Examination of the right-upper field reveals wheezing. Examination of the left-upper field reveals wheezing. Wheezing present. No decreased breath sounds.  Abdominal:     Palpations: Abdomen is soft.  Musculoskeletal:     Cervical back: Neck supple.     Right lower leg: Edema present.     Left lower leg: Edema present.     Comments: Non pitting  Skin:    General: Skin is warm.     Capillary Refill: Capillary refill takes less than 2 seconds.  Neurological:     General: No focal deficit present.     Mental Status: She is alert. Mental status is at baseline.     Motor: Weakness present.     Gait: Gait abnormal.  Psychiatric:        Mood and Affect: Mood normal.  Comments: Alert to self/familiar face, follows some commands     Labs reviewed: Recent Labs    06/03/23 0212 06/10/23 0000 11/09/23 0739 11/12/23 0336 11/13/23 0938  NA  --    < > 142 142 140  K  --    < > 3.5 3.4* 3.4*  CL  --    < > 106 107 106  CO2  --    < > 26 27 25   GLUCOSE  --    < > 113* 119* 147*  BUN  --    < > 10 8 6*  CREATININE  --    < > 0.54 0.49 0.53  CALCIUM  --    < > 8.8* 9.0 9.1  MG 1.9  --   --  2.0 2.0  PHOS 3.4  --   --  2.8  --    < > = values in this interval not displayed.   Recent Labs    06/24/23 1714 11/08/23 0422 11/13/23 0938  AST 25 29 29   ALT 41 18 30  ALKPHOS 93 87 73  BILITOT 0.7 0.3 0.5  PROT 7.3 6.3* 6.5  ALBUMIN 3.3* 3.2* 3.0*   Recent Labs    06/24/23 1714 11/07/23 2052 11/09/23 0739 11/12/23 0336 11/13/23 0938  WBC 11.6* 13.1* 6.4 6.7 7.1  NEUTROABS 8.7* 10.3*  --   --  5.1  HGB 13.2 13.9 13.4 14.1 13.7  HCT 41.9 43.9 41.8 43.8 42.0  MCV 89.5 88.5 87.3 86.9 86.4  PLT 255 191 178 209 174   Lab Results  Component Value Date   TSH 1.386 11/08/2023   No results found for: "HGBA1C" Lab Results  Component Value Date   CHOL 235 (A) 07/20/2020   HDL 100 (A) 07/20/2020   LDLCALC 134 07/20/2020   TRIG 102 07/20/2020    Significant Diagnostic  Results in last 30 days:  CT ABDOMEN PELVIS W CONTRAST Result Date: 11/08/2023 CLINICAL DATA:  Bowel obstruction EXAM: CT ABDOMEN AND PELVIS WITH CONTRAST TECHNIQUE: Multidetector CT imaging of the abdomen and pelvis was performed using the standard protocol following bolus administration of intravenous contrast. RADIATION DOSE REDUCTION: This exam was performed according to the departmental dose-optimization program which includes automated exposure control, adjustment of the mA and/or kV according to patient size and/or use of iterative reconstruction technique. CONTRAST:  75mL OMNIPAQUE IOHEXOL 350 MG/ML SOLN COMPARISON:  None Available. FINDINGS: Lower chest: Extensive multi-vessel coronary artery calcification. Global cardiac size within normal limits. There is dilation of the ascending thoracic aorta which measures at least 4.0 cm in diameter and is incompletely visualized on this examination. This is similar, however, to prior examination 02/04/2023. Bibasilar dependent atelectasis. Hepatobiliary: Evaluation of the upper abdominal solid organs is limited by respiratory motion artifact. Allowing for this, the liver and gallbladder are unremarkable. No intra or extrahepatic biliary ductal dilation. Pancreas: Unremarkable Spleen: Unremarkable Adrenals/Urinary Tract: The adrenal glands are unremarkable. Mild asymmetric right renal cortical atrophy. The kidneys are normal in position. Mild asymmetric dilation of the right renal pelvis with decompression of the right ureter suggests a mild UPJ obstruction. No intrarenal or ureteral calculi. No hydronephrosis on the left. The bladder is unremarkable. Stomach/Bowel: Stomach is within normal limits. Appendix appears normal. No evidence of bowel wall thickening, distention, or inflammatory changes. Vascular/Lymphatic: There is dilation of the left gonadal vein with bilateral adnexal varices identified suggesting changes of ovarian vein reflux and pelvic venous  insufficiency. Moderate atherosclerotic calcification within the abdominal aorta. No aortic  aneurysm. No pathologic adenopathy within the abdomen and pelvis. Reproductive: Pessary in place. Pelvic organs are otherwise unremarkable. Other: Diastasis of the rectus abdominus musculature noted. No abdominopelvic ascites Musculoskeletal: The osseous structures are age-appropriate. No acute bone abnormality IMPRESSION: 1. No acute intra-abdominal pathology identified. No definite radiographic explanation for the patient's reported symptoms. 2. Extensive multi-vessel coronary artery calcification. 3. Dilation of the ascending thoracic aorta which measures at least 4.0 cm in diameter and is incompletely visualized on this examination. This is similar, however, to prior examination 02/04/2023. Follow-up recommendations are as outlined on prior examination. 4. 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 5. Dilation of the left gonadal vein with bilateral adnexal varices identified suggesting changes of ovarian vein reflux and pelvic venous insufficiency. Aortic Atherosclerosis (ICD10-I70.0). Electronically Signed   By: Helyn Numbers M.D.   On: 11/08/2023 01:21    Assessment/Plan 1. Shortness of breath (Primary) - occurred last night> RR 26, use of accessory muscles - CXR noted left pleural effusion and LLL PNA - mild wheezing to upper lobes, otherwise clear - no cough, fever - appears at baseline - suspect OSA since incident occurred at night - cont oxygen prn for O2 sats > 90% - cont albuterol nebs prn - son requesting observation over ED evaluation at this time  2. Suspected sleep apnea - see above  3. Dementia without behavioral disturbance (HCC) - no behaviors  - dependent with all ADLs - ambulates with wheelchair - not on medications - cont skilled nursing  4. Essential hypertension - amlodipine stopped in hospital due to swelling - will increased blood pressures  TID x 7 days (manual) - cont losartan and hydralazine prn    Family/ staff Communication: plan discussed with patient, son and nurse  Labs/tests ordered:  none

## 2023-11-20 ENCOUNTER — Inpatient Hospital Stay (HOSPITAL_COMMUNITY)
Admission: EM | Admit: 2023-11-20 | Discharge: 2023-12-20 | DRG: 871 | Disposition: E | Payer: Medicare Other | Source: Skilled Nursing Facility | Attending: Internal Medicine | Admitting: Internal Medicine

## 2023-11-20 ENCOUNTER — Emergency Department (HOSPITAL_COMMUNITY): Payer: Medicare Other

## 2023-11-20 DIAGNOSIS — Z79899 Other long term (current) drug therapy: Secondary | ICD-10-CM

## 2023-11-20 DIAGNOSIS — Z66 Do not resuscitate: Secondary | ICD-10-CM | POA: Diagnosis present

## 2023-11-20 DIAGNOSIS — Z515 Encounter for palliative care: Secondary | ICD-10-CM

## 2023-11-20 DIAGNOSIS — I10 Essential (primary) hypertension: Secondary | ICD-10-CM | POA: Diagnosis present

## 2023-11-20 DIAGNOSIS — G928 Other toxic encephalopathy: Secondary | ICD-10-CM | POA: Diagnosis present

## 2023-11-20 DIAGNOSIS — J690 Pneumonitis due to inhalation of food and vomit: Secondary | ICD-10-CM | POA: Diagnosis present

## 2023-11-20 DIAGNOSIS — R4701 Aphasia: Secondary | ICD-10-CM | POA: Diagnosis present

## 2023-11-20 DIAGNOSIS — J9601 Acute respiratory failure with hypoxia: Secondary | ICD-10-CM | POA: Diagnosis not present

## 2023-11-20 DIAGNOSIS — I719 Aortic aneurysm of unspecified site, without rupture: Secondary | ICD-10-CM | POA: Diagnosis present

## 2023-11-20 DIAGNOSIS — N319 Neuromuscular dysfunction of bladder, unspecified: Secondary | ICD-10-CM | POA: Diagnosis present

## 2023-11-20 DIAGNOSIS — R6520 Severe sepsis without septic shock: Secondary | ICD-10-CM | POA: Diagnosis present

## 2023-11-20 DIAGNOSIS — Z8711 Personal history of peptic ulcer disease: Secondary | ICD-10-CM

## 2023-11-20 DIAGNOSIS — J90 Pleural effusion, not elsewhere classified: Secondary | ICD-10-CM | POA: Diagnosis present

## 2023-11-20 DIAGNOSIS — T17908A Unspecified foreign body in respiratory tract, part unspecified causing other injury, initial encounter: Secondary | ICD-10-CM | POA: Diagnosis not present

## 2023-11-20 DIAGNOSIS — N390 Urinary tract infection, site not specified: Secondary | ICD-10-CM | POA: Diagnosis present

## 2023-11-20 DIAGNOSIS — G9340 Encephalopathy, unspecified: Secondary | ICD-10-CM

## 2023-11-20 DIAGNOSIS — R1110 Vomiting, unspecified: Secondary | ICD-10-CM | POA: Insufficient documentation

## 2023-11-20 DIAGNOSIS — A419 Sepsis, unspecified organism: Secondary | ICD-10-CM | POA: Diagnosis not present

## 2023-11-20 DIAGNOSIS — F0280 Dementia in other diseases classified elsewhere without behavioral disturbance: Secondary | ICD-10-CM | POA: Diagnosis present

## 2023-11-20 DIAGNOSIS — J9602 Acute respiratory failure with hypercapnia: Secondary | ICD-10-CM | POA: Diagnosis present

## 2023-11-20 DIAGNOSIS — Z1152 Encounter for screening for COVID-19: Secondary | ICD-10-CM

## 2023-11-20 DIAGNOSIS — R112 Nausea with vomiting, unspecified: Secondary | ICD-10-CM | POA: Diagnosis not present

## 2023-11-20 DIAGNOSIS — J189 Pneumonia, unspecified organism: Principal | ICD-10-CM | POA: Diagnosis present

## 2023-11-20 DIAGNOSIS — E860 Dehydration: Secondary | ICD-10-CM | POA: Diagnosis present

## 2023-11-20 DIAGNOSIS — Z993 Dependence on wheelchair: Secondary | ICD-10-CM

## 2023-11-20 DIAGNOSIS — G9341 Metabolic encephalopathy: Secondary | ICD-10-CM | POA: Diagnosis present

## 2023-11-20 DIAGNOSIS — Z882 Allergy status to sulfonamides status: Secondary | ICD-10-CM

## 2023-11-20 DIAGNOSIS — R1310 Dysphagia, unspecified: Secondary | ICD-10-CM | POA: Diagnosis present

## 2023-11-20 DIAGNOSIS — R5381 Other malaise: Secondary | ICD-10-CM | POA: Diagnosis present

## 2023-11-20 DIAGNOSIS — G309 Alzheimer's disease, unspecified: Secondary | ICD-10-CM | POA: Diagnosis present

## 2023-11-20 DIAGNOSIS — Z87440 Personal history of urinary (tract) infections: Secondary | ICD-10-CM

## 2023-11-20 DIAGNOSIS — G4733 Obstructive sleep apnea (adult) (pediatric): Secondary | ICD-10-CM | POA: Diagnosis present

## 2023-11-20 DIAGNOSIS — Z7401 Bed confinement status: Secondary | ICD-10-CM

## 2023-11-20 DIAGNOSIS — E870 Hyperosmolality and hypernatremia: Secondary | ICD-10-CM | POA: Diagnosis present

## 2023-11-20 DIAGNOSIS — E874 Mixed disorder of acid-base balance: Secondary | ICD-10-CM | POA: Diagnosis present

## 2023-11-20 LAB — I-STAT VENOUS BLOOD GAS, ED
Acid-Base Excess: 7 mmol/L — ABNORMAL HIGH (ref 0.0–2.0)
Acid-Base Excess: 0 mmol/L (ref 0.0–2.0)
Bicarbonate: 32.3 mmol/L — ABNORMAL HIGH (ref 20.0–28.0)
Bicarbonate: 28.2 mmol/L — ABNORMAL HIGH (ref 20.0–28.0)
Calcium, Ion: 1.11 mmol/L — ABNORMAL LOW (ref 1.15–1.40)
Calcium, Ion: 1.31 mmol/L (ref 1.15–1.40)
HCT: 40 % (ref 36.0–46.0)
HCT: 40 % (ref 36.0–46.0)
Hemoglobin: 13.6 g/dL (ref 12.0–15.0)
Hemoglobin: 13.6 g/dL (ref 12.0–15.0)
O2 Saturation: 97 %
O2 Saturation: 83 %
Patient temperature: 36.8
Potassium: 4.4 mmol/L (ref 3.5–5.1)
Potassium: 3.9 mmol/L (ref 3.5–5.1)
Sodium: 142 mmol/L (ref 135–145)
Sodium: 141 mmol/L (ref 135–145)
TCO2: 34 mmol/L — ABNORMAL HIGH (ref 22–32)
TCO2: 30 mmol/L (ref 22–32)
pCO2, Ven: 47.7 mm[Hg] (ref 44–60)
pCO2, Ven: 57.1 mm[Hg] (ref 44–60)
pH, Ven: 7.439 — ABNORMAL HIGH (ref 7.25–7.43)
pH, Ven: 7.3 (ref 7.25–7.43)
pO2, Ven: 94 mm[Hg] — ABNORMAL HIGH (ref 32–45)
pO2, Ven: 53 mm[Hg] — ABNORMAL HIGH (ref 32–45)

## 2023-11-20 LAB — COMPREHENSIVE METABOLIC PANEL
ALT: 27 U/L (ref 0–44)
AST: 37 U/L (ref 15–41)
Albumin: 3.2 g/dL — ABNORMAL LOW (ref 3.5–5.0)
Alkaline Phosphatase: 78 U/L (ref 38–126)
Anion gap: 11 (ref 5–15)
BUN: 20 mg/dL (ref 8–23)
CO2: 26 mmol/L (ref 22–32)
Calcium: 8.8 mg/dL — ABNORMAL LOW (ref 8.9–10.3)
Chloride: 105 mmol/L (ref 98–111)
Creatinine, Ser: 0.76 mg/dL (ref 0.44–1.00)
GFR, Estimated: >60 mL/min (ref >60)
Glucose, Bld: 163 mg/dL — ABNORMAL HIGH (ref 70–99)
Potassium: 4.5 mmol/L (ref 3.5–5.1)
Sodium: 142 mmol/L (ref 135–145)
Total Bilirubin: 0.6 mg/dL (ref 0.0–1.2)
Total Protein: 6.6 g/dL (ref 6.5–8.1)

## 2023-11-20 LAB — CBC WITH DIFFERENTIAL/PLATELET
Abs Immature Granulocytes: 0.18 10*3/uL — ABNORMAL HIGH (ref 0.00–0.07)
Basophils Absolute: 0.1 10*3/uL (ref 0.0–0.1)
Basophils Relative: 1 %
Eosinophils Absolute: 0.2 10*3/uL (ref 0.0–0.5)
Eosinophils Relative: 2 %
HCT: 43 % (ref 36.0–46.0)
Hemoglobin: 13.2 g/dL (ref 12.0–15.0)
Immature Granulocytes: 1 %
Lymphocytes Relative: 8 %
Lymphs Abs: 1.2 10*3/uL (ref 0.7–4.0)
MCH: 28 pg (ref 26.0–34.0)
MCHC: 30.7 g/dL (ref 30.0–36.0)
MCV: 91.3 fL (ref 80.0–100.0)
Monocytes Absolute: 0.8 10*3/uL (ref 0.1–1.0)
Monocytes Relative: 6 %
Neutro Abs: 11.8 10*3/uL — ABNORMAL HIGH (ref 1.7–7.7)
Neutrophils Relative %: 82 %
Platelets: 206 10*3/uL (ref 150–400)
RBC: 4.71 MIL/uL (ref 3.87–5.11)
RDW: 13.7 % (ref 11.5–15.5)
WBC: 14.3 10*3/uL — ABNORMAL HIGH (ref 4.0–10.5)
nRBC: 0 % (ref 0.0–0.2)

## 2023-11-20 LAB — D-DIMER, QUANTITATIVE: D-Dimer, Quant: 0.67 ug{FEU}/mL — ABNORMAL HIGH (ref 0.00–0.50)

## 2023-11-20 LAB — I-STAT ARTERIAL BLOOD GAS, ED
Acid-Base Excess: 1 mmol/L (ref 0.0–2.0)
Bicarbonate: 29.4 mmol/L — ABNORMAL HIGH (ref 20.0–28.0)
Calcium, Ion: 1.31 mmol/L (ref 1.15–1.40)
HCT: 40 % (ref 36.0–46.0)
Hemoglobin: 13.6 g/dL (ref 12.0–15.0)
O2 Saturation: 98 %
Patient temperature: 37
Potassium: 3.8 mmol/L (ref 3.5–5.1)
Sodium: 140 mmol/L (ref 135–145)
TCO2: 31 mmol/L (ref 22–32)
pCO2 arterial: 62.3 mm[Hg] — ABNORMAL HIGH (ref 32–48)
pH, Arterial: 7.282 — ABNORMAL LOW (ref 7.35–7.45)
pO2, Arterial: 116 mm[Hg] — ABNORMAL HIGH (ref 83–108)

## 2023-11-20 LAB — BRAIN NATRIURETIC PEPTIDE: B Natriuretic Peptide: 56.8 pg/mL (ref 0.0–100.0)

## 2023-11-20 LAB — PROTIME-INR
INR: 1 (ref 0.8–1.2)
Prothrombin Time: 13.5 s (ref 11.4–15.2)

## 2023-11-20 LAB — I-STAT CHEM 8, ED
BUN: 26 mg/dL — ABNORMAL HIGH (ref 8–23)
Calcium, Ion: 1.1 mmol/L — ABNORMAL LOW (ref 1.15–1.40)
Chloride: 107 mmol/L (ref 98–111)
Creatinine, Ser: 0.8 mg/dL (ref 0.44–1.00)
Glucose, Bld: 161 mg/dL — ABNORMAL HIGH (ref 70–99)
HCT: 42 % (ref 36.0–46.0)
Hemoglobin: 14.3 g/dL (ref 12.0–15.0)
Potassium: 4.4 mmol/L (ref 3.5–5.1)
Sodium: 142 mmol/L (ref 135–145)
TCO2: 30 mmol/L (ref 22–32)

## 2023-11-20 LAB — LACTIC ACID, PLASMA: Lactic Acid, Venous: 2.9 mmol/L (ref 0.5–1.9)

## 2023-11-20 LAB — I-STAT CG4 LACTIC ACID, ED
Lactic Acid, Venous: 1.5 mmol/L (ref 0.5–1.9)
Lactic Acid, Venous: 2.2 mmol/L (ref 0.5–1.9)

## 2023-11-20 LAB — RESP PANEL BY RT-PCR (RSV, FLU A&B, COVID)  RVPGX2
Influenza A by PCR: NEGATIVE
Influenza B by PCR: NEGATIVE
Resp Syncytial Virus by PCR: NEGATIVE
SARS Coronavirus 2 by RT PCR: NEGATIVE

## 2023-11-20 LAB — TROPONIN I (HIGH SENSITIVITY)
Troponin I (High Sensitivity): 8 ng/L (ref <18)
Troponin I (High Sensitivity): 9 ng/L (ref <18)

## 2023-11-20 LAB — APTT: aPTT: 31 s (ref 24–36)

## 2023-11-20 MED ORDER — LACTATED RINGERS IV BOLUS
1000.0000 mL | Freq: Once | INTRAVENOUS | Status: AC
  Administered 2023-11-20: 1000 mL via INTRAVENOUS

## 2023-11-20 MED ORDER — IPRATROPIUM-ALBUTEROL 0.5-2.5 (3) MG/3ML IN SOLN
RESPIRATORY_TRACT | Status: AC
  Filled 2023-11-20: qty 3

## 2023-11-20 MED ORDER — ONDANSETRON HCL 4 MG/2ML IJ SOLN
4.0000 mg | Freq: Once | INTRAMUSCULAR | Status: AC
  Administered 2023-11-20: 4 mg via INTRAVENOUS
  Filled 2023-11-20: qty 2

## 2023-11-20 MED ORDER — VANCOMYCIN HCL IN DEXTROSE 1-5 GM/200ML-% IV SOLN
1000.0000 mg | Freq: Once | INTRAVENOUS | Status: AC
  Administered 2023-11-20: 1000 mg via INTRAVENOUS
  Filled 2023-11-20: qty 200

## 2023-11-20 MED ORDER — IOHEXOL 350 MG/ML SOLN
65.0000 mL | Freq: Once | INTRAVENOUS | Status: AC | PRN
  Administered 2023-11-20: 65 mL via INTRAVENOUS

## 2023-11-20 MED ORDER — SODIUM CHLORIDE 0.9 % IV SOLN
2.0000 g | Freq: Once | INTRAVENOUS | Status: AC
  Administered 2023-11-20: 2 g via INTRAVENOUS
  Filled 2023-11-20: qty 12.5

## 2023-11-20 MED ORDER — SODIUM CHLORIDE 0.9 % IV SOLN
3.0000 g | Freq: Once | INTRAVENOUS | Status: AC
  Administered 2023-11-20: 3 g via INTRAVENOUS
  Filled 2023-11-20: qty 8

## 2023-11-20 NOTE — ED Provider Notes (Signed)
[place] EMERGENCY DEPARTMENT AT [facility] Provider Note   CSN: [id number] Arrival date & time: 11/20/23  1428     History  Chief Complaint  Patient presents with   Respiratory Distress    Cheryl Monroe is a 89 y.o. female.  HPI     89yo female with history of dementia, aphasia, neurogenic bladder, hypertension, recurrent UTIs, osteoarthritis, recent admission to the hospital with recent admission 1/17-1/23 for UTI who presents with concern for respiratory distress.  48 hours ago she had increased respirations, had CXR showing pleural effusion and possible pneumonia--had concern for shortness of breath, hypoxia, however it then improved and had been doing better until this afternoon.  This afternoon she vomited and following that was suddenly in respiratory distress.  She was reportedly cyanotic with increased work of breathing, EMS placed on nonrebreather and bagged en route intermittently.    Baseline she typically follows commands, is wheelchair dependent, dependent for ADLs, has difficulty speaking/underlying aphasia.  After event today with respiratory disress she is also sleepier    Home Medications Prior to Admission medications   Medication Sig Start Date End Date Taking? Authorizing Provider  Cranberry (THERACRAN PO) Take 250 mg by mouth 2 (two) times daily.   Yes [provider]  hydrALAZINE (APRESOLINE) 10 MG tablet Take 10 mg by mouth every 8 (eight) hours as needed (SBP > 170).   Yes [provider]  losartan (COZAAR) 25 MG tablet Take 25 mg by mouth daily.   Yes [provider]  nitrofurantoin (MACRODANTIN) 50 MG capsule Take 1 capsule (50 mg total) by mouth 2 (two) times daily. 11/14/23  Yes Glade Lloyd, MD  pantoprazole (PROTONIX) 20 MG tablet Take 1 tablet (20 mg total) by mouth 2 (two) times daily. 06/25/23  Yes Fargo, Amy E, NP  polyethylene glycol (MIRALAX / GLYCOLAX) 17 g packet Take 17 g by mouth every other day.    Yes [provider]  senna (SENOKOT) 8.6 MG TABS tablet Take 17.2 mg by mouth every evening.   Yes [provider]      Allergies    Sulfa antibiotics    Review of Systems   Review of Systems  Physical Exam Updated Vital Signs BP 116/62 (BP Location: Left Wrist)   Pulse 79   Temp 98.1 F (36.7 C) (Axillary)   Resp (!) 24   SpO2 99%  Physical Exam Vitals and nursing note reviewed.  Constitutional:      General: She is in acute distress.     Appearance: She is well-developed. She is ill-appearing. She is not diaphoretic.     Comments: Sleepy Will follow commands  HENT:     Head: Normocephalic and atraumatic.     Comments: No stridor, no posterior pharyngeal swelling Eyes:     Conjunctiva/sclera: Conjunctivae normal.  Cardiovascular:     Rate and Rhythm: Normal rate and regular rhythm.     Heart sounds: Normal heart sounds. No murmur heard.    No friction rub. No gallop.  Pulmonary:     Effort: Tachypnea, accessory muscle usage and respiratory distress present.     Breath sounds: Decreased breath sounds and wheezing present. No rales.  Abdominal:     General: There is no distension.     Palpations: Abdomen is soft.     Tenderness: There is no abdominal tenderness. There is no guarding.  Musculoskeletal:        General: No tenderness.     Cervical back: Normal  range of motion.  Skin:    General: Skin is warm and dry.     Findings: No erythema or rash.     ED Results / Procedures / Treatments   Labs (all labs ordered are listed, but only abnormal results are displayed) Labs Reviewed  COMPREHENSIVE METABOLIC PANEL - Abnormal; Notable for the following components:      Result Value   Glucose, Bld 163 (*)    Calcium 8.8 (*)    Albumin 3.2 (*)    All other components within normal limits  CBC WITH DIFFERENTIAL/PLATELET - Abnormal; Notable for the following components:   WBC 14.3 (*)    Neutro Abs 11.8 (*)    Abs Immature Granulocytes 0.18 (*)     All other components within normal limits  URINALYSIS, W/ REFLEX TO CULTURE (INFECTION SUSPECTED) - Abnormal; Notable for the following components:   Specific Gravity, Urine >1.046 (*)    All other components within normal limits  D-DIMER, QUANTITATIVE - Abnormal; Notable for the following components:   D-Dimer, Quant 0.67 (*)    All other components within normal limits  LACTIC ACID, PLASMA - Abnormal; Notable for the following components:   Lactic Acid, Venous 2.9 (*)    All other components within normal limits  BASIC METABOLIC PANEL - Abnormal; Notable for the following components:   Glucose, Bld 217 (*)    Calcium 8.8 (*)    All other components within normal limits  LACTIC ACID, PLASMA - Abnormal; Notable for the following components:   Lactic Acid, Venous 2.4 (*)    All other components within normal limits  I-STAT VENOUS BLOOD GAS, ED - Abnormal; Notable for the following components:   pH, Ven 7.439 (*)    pO2, Ven 94 (*)    Bicarbonate 32.3 (*)    TCO2 34 (*)    Acid-Base Excess 7.0 (*)    Calcium, Ion 1.11 (*)    All other components within normal limits  I-STAT ARTERIAL BLOOD GAS, ED - Abnormal; Notable for the following components:   pH, Arterial 7.282 (*)    pCO2 arterial 62.3 (*)    pO2, Arterial 116 (*)    Bicarbonate 29.4 (*)    All other components within normal limits  I-STAT CHEM 8, ED - Abnormal; Notable for the following components:   BUN 26 (*)    Glucose, Bld 161 (*)    Calcium, Ion 1.10 (*)    All other components within normal limits  I-STAT CG4 LACTIC ACID, ED - Abnormal; Notable for the following components:   Lactic Acid, Venous 2.2 (*)    All other components within normal limits  I-STAT VENOUS BLOOD GAS, ED - Abnormal; Notable for the following components:   pO2, Ven 53 (*)    Bicarbonate 28.2 (*)    All other components within normal limits  I-STAT VENOUS BLOOD GAS, ED - Abnormal; Notable for the following components:   Bicarbonate 28.8  (*)    All other components within normal limits  RESP PANEL BY RT-PCR (RSV, FLU A&B, COVID)  RVPGX2  CULTURE, BLOOD (ROUTINE X 2)  CULTURE, BLOOD (ROUTINE X 2)  RESPIRATORY PANEL BY PCR  MRSA NEXT GEN BY PCR, NASAL  PROTIME-INR  APTT  BRAIN NATRIURETIC PEPTIDE  CBC  MAGNESIUM  PHOSPHORUS  BRAIN NATRIURETIC PEPTIDE  TSH  LAB REPORT - SCANNED   Narrative:    Ordered by an unspecified provider.  BLOOD GAS, VENOUS  AMMONIA  I-STAT CG4 LACTIC ACID, ED  I-STAT ARTERIAL BLOOD GAS, ED  I-STAT ARTERIAL BLOOD GAS, ED  TROPONIN I (HIGH SENSITIVITY)  TROPONIN I (HIGH SENSITIVITY)    EKG EKG Interpretation Date/Time:  Thursday November 20 2023 14:34:33 EST Ventricular Rate:  75 PR Interval:  158 QRS Duration:  145 QT Interval:  410 QTC Calculation: 452 R Axis:   47  Text Interpretation: Sinus rhythm Multiple ventricular premature complexes Consider left atrial enlargement Confirmed by Virgina Norfolk [phone]) on 11/20/2023 2:52:22 PM  Radiology DG Abd 1 View Result Date: 11/21/2023 CLINICAL DATA:  Recent aspiration EXAM: ABDOMEN - 1 VIEW COMPARISON:  None Available. FINDINGS: Scattered large and small bowel gas is noted. No free air is seen. No abnormal mass or abnormal calcifications are noted. Contrast is noted within the bladder from recent CT. IMPRESSION: No acute abnormality noted. Electronically Signed   By: Alcide Clever M.D.   On: 11/21/2023 02:49   CT Angio Chest PE W and/or Wo Contrast Result Date: 11/20/2023 CLINICAL DATA:  Pulmonary embolism (PE) suspected, high prob. Leukocytosis EXAM: CT ANGIOGRAPHY CHEST WITH CONTRAST TECHNIQUE: Multidetector CT imaging of the chest was performed using the standard protocol during bolus administration of intravenous contrast. Multiplanar CT image reconstructions and MIPs were obtained to evaluate the vascular anatomy. RADIATION DOSE REDUCTION: This exam was performed according to the departmental dose-optimization program which includes  automated exposure control, adjustment of the mA and/or kV according to patient size and/or use of iterative reconstruction technique. CONTRAST:  65mL OMNIPAQUE IOHEXOL 350 MG/ML SOLN COMPARISON:  02/04/2023 FINDINGS: Cardiovascular: Satisfactory opacification of the pulmonary arteries to the segmental level. No evidence of pulmonary embolism. Mid ascending thoracic aorta measures 4.0 cm in diameter, stable. Scattered atherosclerotic vascular calcifications of the aorta and coronary arteries. Normal heart size. No pericardial effusion. Mediastinum/Nodes: No axillary, mediastinal, or hilar lymphadenopathy 4.7 cm fluid attenuation nodule within the left thyroid lobe, unchanged. This results in rightward tracheal deviation. Esophagus within normal limits. Lungs/Pleura: Extensive patchy bilateral airspace consolidations, most pronounced within the perihilar regions. No pleural effusion or pneumothorax. Upper Abdomen: No acute abnormality. Musculoskeletal: No chest wall abnormality. No acute or significant osseous findings. Review of the MIP images confirms the above findings. IMPRESSION: 1. No evidence of pulmonary embolism. 2. Extensive patchy bilateral airspace consolidations, most pronounced within the perihilar regions. Findings are most compatible with multifocal pneumonia. Radiographic follow-up to resolution is recommended. 3. Stable 4.7 cm left thyroid nodule. In the setting of significant comorbidities or limited life expectancy, no follow-up recommended (ref: J Am Coll Radiol. 2015 Feb;12(2): 143-50). 4. Aortic and coronary artery atherosclerosis (ICD10-I70.0). Electronically Signed   By: Duanne Guess D.O.   On: 11/20/2023 21:53   DG Chest Port 1 View Result Date: 11/20/2023 CLINICAL DATA:  Concern for sepsis.  Vomiting. EXAM: PORTABLE CHEST 1 VIEW COMPARISON:  Chest radiograph dated June 24, 2023. FINDINGS: The heart size and mediastinal contours are within normal limits. Aortic atherosclerosis.  Streaky opacities in the bilateral lower lungs, more pronounced on the right. No pleural effusion or pneumothorax. No acute osseous abnormality. IMPRESSION: Streaky opacities in the bilateral lower lungs, more pronounced on the right, could reflect atelectasis or infiltrate. Electronically Signed   By: Hart Robinsons M.D.   On: 11/20/2023 15:46    Procedures Procedures    Medications Ordered in ED Medications  vancomycin (VANCOREADY) IVPB 500 mg/100 mL (0 mg Intravenous Stopped 11/21/23 0326)  Ampicillin-Sulbactam (UNASYN) 3 g in sodium chloride 0.9 % 100 mL IVPB (0 g Intravenous Stopped 11/21/23 0818)  enoxaparin (  LOVENOX) injection 40 mg (has no administration in time range)  sodium chloride flush (NS) 0.9 % injection 3 mL (3 mLs Intravenous Given 11/21/23 1056)  albuterol (PROVENTIL) (2.5 MG/3ML) 0.083% nebulizer solution 2.5 mg (2.5 mg Nebulization Given 11/21/23 0758)  pantoprazole (PROTONIX) injection 40 mg (40 mg Intravenous Given 11/21/23 1102)  bisacodyl (DULCOLAX) suppository 10 mg (has no administration in time range)  bisacodyl (DULCOLAX) suppository 10 mg (10 mg Rectal Not Given 11/21/23 1058)  metoCLOPramide (REGLAN) injection 10 mg (10 mg Intravenous Given 11/21/23 1102)  vancomycin (VANCOCIN) IVPB 1000 mg/200 mL premix (0 mg Intravenous Stopped 11/20/23 1812)  ceFEPIme (MAXIPIME) 2 g in sodium chloride 0.9 % 100 mL IVPB (0 g Intravenous Stopped 11/20/23 1657)  ondansetron (ZOFRAN) injection 4 mg (4 mg Intravenous Given 11/20/23 1749)  Ampicillin-Sulbactam (UNASYN) 3 g in sodium chloride 0.9 % 100 mL IVPB (0 g Intravenous Stopped 11/20/23 2102)  lactated ringers bolus 1,000 mL (0 mLs Intravenous Stopped 11/20/23 1812)  ipratropium-albuterol (DUONEB) 0.5-2.5 (3) MG/3ML nebulizer solution (  Given 11/20/23 1844)  iohexol (OMNIPAQUE) 350 MG/ML injection 65 mL (65 mLs Intravenous Contrast Given 11/20/23 2118)  Ampicillin-Sulbactam (UNASYN) 3 g in sodium chloride 0.9 % 100 mL IVPB (0 g  Intravenous Stopped 11/21/23 0220)  bisacodyl (DULCOLAX) suppository 10 mg (10 mg Rectal Given 11/21/23 0604)  magnesium sulfate IVPB 2 g 50 mL (0 g Intravenous Stopped 11/21/23 5621)    ED Course/ Medical Decision Making/ A&P                                    [phone] female with history of dementia, aphasia, neurogenic bladder, hypertension, recurrent UTIs, osteoarthritis, recent admission to the hospital with recent admission 1/17-1/23 for UTI who presents with concern for respiratory distress.  Differential diagnosis for dyspnea includes ACS, PE, COPD exacerbation, CHF exacerbation, anemia, pneumonia, viral etiology such as COVID 19 infection, metabolic abnormality.    With EMS, was placed on nonrebreather en route and per report also was given some breaths via BVM.  On arrival to ED, transitioned to nasal cannula and maintaining saturations but significant tachypnea and increased work of breathing.  Discussed goals of care--she has DNR in place however POA Dr. Sherryll Burger would consider intubation depending if it is felt to be temporary.  Given history of aspiration with significant tachypnea, consulted pulmonology regarding possible intubation/bronchoscopy to evaluate for other obstructive pathology.  Did discuss given her wishes, we would avoid intubation at this time and reevaluate after BiPAP.  Critical care came to evaluate, did not feel intubation indicated at this time.  Called back as she appeared to be worsening and ordered ABG which showed pH 7.282 pCO2 62.  Do not feel this abnormality severe enough at this time to recommend intubation particularly in the setting of her prior wishes.    Labs completed and personally evaluated by me show mild hyperglycemia, no clinically significant electrolyte abnormalities. CBC without anemia but does show WBC 14000. TSH normal. BNP and troponin normal and have low suspicion for ACS/CHF. UA without infection.   CXR with streaky opacities.  Given vancomycin,  cefepime and ordered unasyn with concern for aspiration.    CT PE study ordered and shows no PE, does show multifocal pneumonia, stable thyroid nodule.   Suspect respiratory distress, encephalopathy secondary to multifocal pneumonia.  She is sleepy but does wake and follow commands.  At this time is stable on BiPAP, RR  is stable.  Ordered ABG and called RT to complete this. A VBG was completed and shows PH 7.365 and pCO2 50.  At this time feel we do not require intubation and can continue BiPAP, hospitalist admission for pneumonia.  Discussed with family including son and daughter at bedside and son on the phone to obtain clarity regarding desire for intubation.  Family would like to be involved in decisions regarding more aggressive care however at this time if she were to worsen and we could not get in touch with them POA states she will be DNR/DNI.           Final Clinical Impression(s) / ED Diagnoses Final diagnoses:  Multifocal pneumonia  Acute hypoxic respiratory failure (HCC)  Encephalopathy, unspecified type    Rx / DC Orders ED Discharge Orders     None         Alvira Monday, MD 11/21/23 1126

## 2023-11-20 NOTE — Progress Notes (Signed)
Pt transported to CT and back via Bipap w/o complications RT & RN @ bedside.

## 2023-11-20 NOTE — ED Notes (Signed)
Post void residual =4mL

## 2023-11-20 NOTE — Consult Note (Addendum)
NAME:  Cheryl Monroe, MRN:  [id number], DOB:  1934/11/03, LOS: 0 ADMISSION DATE:  11/20/2023, CONSULTATION DATE:  11/20/2023 REFERRING MD: Dr. Alvira Monday, CHIEF COMPLAINT: Gross aspiration  History of Present Illness:  This 89 year old Asian female seen in consultation at the request of Dr. Lafe Garin for recommendations on further evaluation and management of respiratory distress.  Specifically, she is called requesting recommendations on endotracheal intubation.  The patient is a resident from [facility].  She was apparently seen at [facility] for an acute outpatient visit yesterday (1 day prior to presentation) for a gross aspiration event after an episode of emesis.  She was observed to have room air SpO2 84-90%.  She was subsequently placed on supplemental oxygen at 2 LPM via nasal cannula with prompt improvement in SpO2 to 99%.  She was transferred to [facility] emergency department today due to concerns for possible aspiration pneumonia.  In the emergency department, the patient was noted to be hypoxic with increasing oxygen requirements.  She has been placed on BiPAP and is breathing comfortably.  She is hemodynamically stable.  Chest x-ray shows bilateral streaky opacities in the lower lungs, consistent with recent aspiration event.     Pertinent  Medical History  PAST MEDICAL HISTORY significant for Alzheimer's disease, balance problems, constipation, spinal stenosis, osteoarthritis, pyelonephritis.  She was recently hospitalized (1/17 - 1/23) for sepsis secondary to urinary tract infection.  She was treated with Rocephin/fluconazole IV.  Significant Hospital Events: Including procedures, antibiotic start and stop dates in addition to other pertinent events   1/30: Presented to ER 1 day after gross aspiration event at skilled nursing facility.  Placed on BiPAP for respiratory support.  Interim History / Subjective:  Not applicable  Objective    Blood pressure (!) 130/90, pulse 75, temperature 98.6 F (37 C), temperature source Oral, resp. rate (!) 32.    FiO2 (%):  [60 %] 60 %  No intake or output data in the 24 hours ending 11/20/23 1739 There were no vitals filed for this visit.  Examination: General: On BiPAP.  Breathing comfortably.  Awake, alert.  Follows commands.  Minimally conversant.  Baseline dementia. HENT: Pupils are equal round reactive light and accommodation.  Extraocular muscle functions are grossly intact.  No conjunctival anemia.  Mucosal membranes are moist. Lungs: Scattered bilateral crackles.  Scant wheezes.  Diminished air entry. Cardiovascular: Giller S1 and S2 without murmur, rub or gallop.  Not tachycardic. Abdomen: Soft.  Flat.  Bowel sounds present.  Nondistended.  Nontender. Extremities: Decreased tone.  Wheelchair-bound at baseline.  No pitting edema.  No clubbing.  No cyanosis. Neuro: Baseline dementia.  Cranial nerves II through XII are grossly symmetric and physiologic.  No sensory level elicited.  Bilateral lower extremity motor 3+. GU: No Foley catheter in situ.  Resolved Hospital Problem list   Not applicable  Assessment & Plan:  ACUTE HYPOXEMIC RESPIRATORY FAILURE There is no compelling indication for endotracheal intubation and INVASIVE mechanical ventilatory support at this time.  She is mentating well, protecting her airway, following commands and not in extremis.  On the other hand, I agree with liberal use of CPAP/BiPAP support for this patient. Check ABG.  Titrate CPAP/BiPAP settings based on ABG results. Albuterol every 4 hours as needed for wheezing. ASPIRATION PNEUMONIA Based on recent hospitalization, the patient should have broad-spectrum antibiotic coverage to include nosocomial coverage.  I agree with Unasyn/vancomycin. Aspiration precautions. Head of bed elevated to 30+ degrees at all times.  OBSTRUCTIVE SLEEP APNEA, witnessed, not treated to baseline NAUSEA/VOMITING in a SNF  patient  PCCM will follow. I will defer further evaluation and management to the admitting team.  Best Practice (right click and "Reselect all SmartList Selections" daily)   Diet/type: dysphagia diet (see orders) DVT prophylaxis prophylactic heparin  Pressure ulcer(s): N/A GI prophylaxis: PPI Lines: N/A Foley:  N/A Code Status:  DNR.  Of note, the patient has a durable DNR/DNI. Last date of multidisciplinary goals of care discussion [PENDING]  Labs   CBC: Recent Labs  Lab 11/20/23 1510 11/20/23 1518 11/20/23 1700  WBC 14.3*  --   --   NEUTROABS 11.8*  --   --   HGB 13.2 13.6 14.3  HCT 43.0 40.0 42.0  MCV 91.3  --   --   PLT 206  --   --     Basic Metabolic Panel: Recent Labs  Lab 11/20/23 1510 11/20/23 1518 11/20/23 1700  NA 142 142 142  K 4.5 4.4 4.4  CL 105  --  107  CO2 26  --   --   GLUCOSE 163*  --  161*  BUN 20  --  26*  CREATININE 0.76  --  0.80  CALCIUM 8.8*  --   --    GFR: Estimated Creatinine Clearance: 47.7 mL/min (by C-G formula based on SCr of 0.8 mg/dL). Recent Labs  Lab 11/20/23 1510 11/20/23 1518  WBC 14.3*  --   LATICACIDVEN  --  1.5    Liver Function Tests: Recent Labs  Lab 11/20/23 1510  AST 37  ALT 27  ALKPHOS 78  BILITOT 0.6  PROT 6.6  ALBUMIN 3.2*   No results for input(s): "LIPASE", "AMYLASE" in the last 168 hours. No results for input(s): "AMMONIA" in the last 168 hours.  ABG    Component Value Date/Time   HCO3 32.3 (H) 11/20/2023 1518   TCO2 30 11/20/2023 1700   O2SAT 97 11/20/2023 1518     Coagulation Profile: Recent Labs  Lab 11/20/23 1510  INR 1.0    Cardiac Enzymes: No results for input(s): "CKTOTAL", "CKMB", "CKMBINDEX", "TROPONINI" in the last 168 hours.  HbA1C: No results found for: "HGBA1C"  CBG: No results for input(s): "GLUCAP" in the last 168 hours.  Review of Systems:   As highlighted above and in the HPI.  Past Medical History:  She,  has a past medical history of Alzheimer disease  (HCC), Balance problem (07/19/2020), Constipation (07/19/2020), Dementia without behavioral disturbance (HCC) (07/19/2020), Fall, Osteoarthritis, Pyelonephritis, Spinal stenosis (07/19/2020), and Weakness of left lower extremity (07/19/2020).   Surgical History:  No past surgical history on file.   Social History:   reports that she has never smoked. She has never used smokeless tobacco. She reports that she does not currently use alcohol. She reports that she does not currently use drugs.   Family History:  Her family history includes ALS in her mother.   Allergies Allergies  Allergen Reactions   Sulfa Antibiotics Rash     Home Medications  Prior to Admission medications   Medication Sig Start Date End Date Taking? Authorizing Provider  Cranberry (THERACRAN PO) Take 250 mg by mouth 2 (two) times daily.   Yes [provider]  hydrALAZINE (APRESOLINE) 10 MG tablet Take 10 mg by mouth every 8 (eight) hours as needed (SBP > 170).   Yes [provider]  losartan (COZAAR) 25 MG tablet Take 25 mg by mouth daily.   Yes [provider]  nitrofurantoin (  MACRODANTIN) 50 MG capsule Take 1 capsule (50 mg total) by mouth 2 (two) times daily. 11/14/23  Yes Glade Lloyd, MD  pantoprazole (PROTONIX) 20 MG tablet Take 1 tablet (20 mg total) by mouth 2 (two) times daily. 06/25/23  Yes Fargo, Amy E, NP  polyethylene glycol (MIRALAX / GLYCOLAX) 17 g packet Take 17 g by mouth every other day.   Yes [provider]  senna (SENOKOT) 8.6 MG TABS tablet Take 17.2 mg by mouth every evening.   Yes [provider]     Critical care time: - minutes    Marcelle Smiling, MD Board Certified by the ABIM, Pulmonary Diseases & Critical Care Medicine

## 2023-11-20 NOTE — ED Notes (Signed)
MD at bedside at this time.

## 2023-11-20 NOTE — Sepsis Progress Note (Signed)
Notified provider of need to order repeat lactic acid.

## 2023-11-20 NOTE — Sepsis Progress Note (Signed)
Difficulty in gaining IV access for antibiotics per bedside RN Mariel.

## 2023-11-20 NOTE — Progress Notes (Signed)
Pt was placed on BiPAP 16/6 60% due to increase WOB. Pt is tolerating well at this time

## 2023-11-20 NOTE — ED Notes (Signed)
Complete bed change. Peri care completed prior to PureWick being placed.

## 2023-11-20 NOTE — ED Triage Notes (Signed)
Pt bib [facility] from [facility]. Coming from Pt was Dx pneumonia yesterday. EMS called out due to pt being in respiratory distress.GCS 6 per ems. No LKW.   Hx dementia  Cognitive Baseline has been declining over past week     88 RA  Nrb 15l, bagged in rout intermittently Pt now at 98% NRB   Ems vs  144cbg  10 capnography 130/90 22lhand 79 hr

## 2023-11-20 NOTE — Sepsis Progress Note (Signed)
eLink is following this Code Sepsis.

## 2023-11-20 NOTE — ED Notes (Signed)
Pt showing increased work of breathing on BiPap. RT at bedside. MD made aware.

## 2023-11-21 ENCOUNTER — Inpatient Hospital Stay (HOSPITAL_COMMUNITY): Payer: Medicare Other

## 2023-11-21 DIAGNOSIS — E860 Dehydration: Secondary | ICD-10-CM | POA: Diagnosis present

## 2023-11-21 DIAGNOSIS — E874 Mixed disorder of acid-base balance: Secondary | ICD-10-CM | POA: Diagnosis present

## 2023-11-21 DIAGNOSIS — I719 Aortic aneurysm of unspecified site, without rupture: Secondary | ICD-10-CM | POA: Diagnosis present

## 2023-11-21 DIAGNOSIS — I10 Essential (primary) hypertension: Secondary | ICD-10-CM | POA: Diagnosis present

## 2023-11-21 DIAGNOSIS — Z515 Encounter for palliative care: Secondary | ICD-10-CM | POA: Diagnosis not present

## 2023-11-21 DIAGNOSIS — Z7189 Other specified counseling: Secondary | ICD-10-CM | POA: Diagnosis not present

## 2023-11-21 DIAGNOSIS — Z66 Do not resuscitate: Secondary | ICD-10-CM | POA: Diagnosis present

## 2023-11-21 DIAGNOSIS — R569 Unspecified convulsions: Secondary | ICD-10-CM | POA: Diagnosis not present

## 2023-11-21 DIAGNOSIS — T17908A Unspecified foreign body in respiratory tract, part unspecified causing other injury, initial encounter: Secondary | ICD-10-CM | POA: Diagnosis present

## 2023-11-21 DIAGNOSIS — N390 Urinary tract infection, site not specified: Secondary | ICD-10-CM | POA: Diagnosis present

## 2023-11-21 DIAGNOSIS — G309 Alzheimer's disease, unspecified: Secondary | ICD-10-CM | POA: Diagnosis present

## 2023-11-21 DIAGNOSIS — N319 Neuromuscular dysfunction of bladder, unspecified: Secondary | ICD-10-CM | POA: Diagnosis present

## 2023-11-21 DIAGNOSIS — J189 Pneumonia, unspecified organism: Secondary | ICD-10-CM

## 2023-11-21 DIAGNOSIS — F0280 Dementia in other diseases classified elsewhere without behavioral disturbance: Secondary | ICD-10-CM | POA: Diagnosis present

## 2023-11-21 DIAGNOSIS — R6520 Severe sepsis without septic shock: Secondary | ICD-10-CM | POA: Diagnosis present

## 2023-11-21 DIAGNOSIS — G4733 Obstructive sleep apnea (adult) (pediatric): Secondary | ICD-10-CM

## 2023-11-21 DIAGNOSIS — R112 Nausea with vomiting, unspecified: Secondary | ICD-10-CM

## 2023-11-21 DIAGNOSIS — Z1152 Encounter for screening for COVID-19: Secondary | ICD-10-CM | POA: Diagnosis not present

## 2023-11-21 DIAGNOSIS — T17908D Unspecified foreign body in respiratory tract, part unspecified causing other injury, subsequent encounter: Secondary | ICD-10-CM | POA: Diagnosis not present

## 2023-11-21 DIAGNOSIS — J90 Pleural effusion, not elsewhere classified: Secondary | ICD-10-CM | POA: Diagnosis present

## 2023-11-21 DIAGNOSIS — J690 Pneumonitis due to inhalation of food and vomit: Secondary | ICD-10-CM | POA: Diagnosis present

## 2023-11-21 DIAGNOSIS — R1110 Vomiting, unspecified: Secondary | ICD-10-CM | POA: Insufficient documentation

## 2023-11-21 DIAGNOSIS — R1310 Dysphagia, unspecified: Secondary | ICD-10-CM | POA: Diagnosis present

## 2023-11-21 DIAGNOSIS — E870 Hyperosmolality and hypernatremia: Secondary | ICD-10-CM | POA: Diagnosis present

## 2023-11-21 DIAGNOSIS — R4701 Aphasia: Secondary | ICD-10-CM | POA: Diagnosis present

## 2023-11-21 DIAGNOSIS — J9602 Acute respiratory failure with hypercapnia: Secondary | ICD-10-CM | POA: Diagnosis present

## 2023-11-21 DIAGNOSIS — A419 Sepsis, unspecified organism: Secondary | ICD-10-CM | POA: Diagnosis present

## 2023-11-21 DIAGNOSIS — G9340 Encephalopathy, unspecified: Secondary | ICD-10-CM | POA: Diagnosis not present

## 2023-11-21 DIAGNOSIS — R5381 Other malaise: Secondary | ICD-10-CM | POA: Diagnosis present

## 2023-11-21 DIAGNOSIS — G9341 Metabolic encephalopathy: Secondary | ICD-10-CM | POA: Diagnosis present

## 2023-11-21 DIAGNOSIS — J9601 Acute respiratory failure with hypoxia: Secondary | ICD-10-CM | POA: Diagnosis present

## 2023-11-21 DIAGNOSIS — G928 Other toxic encephalopathy: Secondary | ICD-10-CM | POA: Diagnosis present

## 2023-11-21 LAB — URINALYSIS, W/ REFLEX TO CULTURE (INFECTION SUSPECTED)
Bacteria, UA: NONE SEEN
Bilirubin Urine: NEGATIVE
Glucose, UA: NEGATIVE mg/dL
Hgb urine dipstick: NEGATIVE
Ketones, ur: NEGATIVE mg/dL
Leukocytes,Ua: NEGATIVE
Nitrite: NEGATIVE
Protein, ur: NEGATIVE mg/dL
Specific Gravity, Urine: >1.046 — ABNORMAL HIGH (ref 1.005–1.030)
pH: 5 (ref 5.0–8.0)

## 2023-11-21 LAB — RESPIRATORY PANEL BY PCR

## 2023-11-21 LAB — CBC
HCT: 43.6 % (ref 36.0–46.0)
Hemoglobin: 13.3 g/dL (ref 12.0–15.0)
MCH: 28.1 pg (ref 26.0–34.0)
MCHC: 30.5 g/dL (ref 30.0–36.0)
MCV: 92.2 fL (ref 80.0–100.0)
Platelets: 157 10*3/uL (ref 150–400)
RBC: 4.73 MIL/uL (ref 3.87–5.11)
RDW: 14 % (ref 11.5–15.5)
WBC: 10.2 10*3/uL (ref 4.0–10.5)
nRBC: 0 % (ref 0.0–0.2)

## 2023-11-21 LAB — BASIC METABOLIC PANEL
Anion gap: 10 (ref 5–15)
BUN: 18 mg/dL (ref 8–23)
CO2: 28 mmol/L (ref 22–32)
Calcium: 8.8 mg/dL — ABNORMAL LOW (ref 8.9–10.3)
Chloride: 104 mmol/L (ref 98–111)
Creatinine, Ser: 0.74 mg/dL (ref 0.44–1.00)
GFR, Estimated: >60 mL/min (ref >60)
Glucose, Bld: 217 mg/dL — ABNORMAL HIGH (ref 70–99)
Potassium: 4.1 mmol/L (ref 3.5–5.1)
Sodium: 142 mmol/L (ref 135–145)

## 2023-11-21 LAB — I-STAT VENOUS BLOOD GAS, ED
Acid-Base Excess: 2 mmol/L (ref 0.0–2.0)
Bicarbonate: 28.8 mmol/L — ABNORMAL HIGH (ref 20.0–28.0)
Calcium, Ion: 1.16 mmol/L (ref 1.15–1.40)
HCT: 41 % (ref 36.0–46.0)
Hemoglobin: 13.9 g/dL (ref 12.0–15.0)
O2 Saturation: 76 %
Potassium: 4.1 mmol/L (ref 3.5–5.1)
Sodium: 143 mmol/L (ref 135–145)
TCO2: 30 mmol/L (ref 22–32)
pCO2, Ven: 50.4 mm[Hg] (ref 44–60)
pH, Ven: 7.365 (ref 7.25–7.43)
pO2, Ven: 43 mm[Hg] (ref 32–45)

## 2023-11-21 LAB — TSH: TSH: 0.443 u[IU]/mL (ref 0.350–4.500)

## 2023-11-21 LAB — MAGNESIUM: Magnesium: 1.9 mg/dL (ref 1.7–2.4)

## 2023-11-21 LAB — BRAIN NATRIURETIC PEPTIDE: B Natriuretic Peptide: 81.6 pg/mL (ref 0.0–100.0)

## 2023-11-21 LAB — PHOSPHORUS: Phosphorus: 3.1 mg/dL (ref 2.5–4.6)

## 2023-11-21 LAB — LACTIC ACID, PLASMA: Lactic Acid, Venous: 2.4 mmol/L (ref 0.5–1.9)

## 2023-11-21 MED ORDER — PIPERACILLIN-TAZOBACTAM 3.375 G IVPB
3.3750 g | Freq: Three times a day (TID) | INTRAVENOUS | Status: DC
  Administered 2023-11-21 – 2023-11-25 (×12): 3.375 g via INTRAVENOUS
  Filled 2023-11-21 (×12): qty 50

## 2023-11-21 MED ORDER — VANCOMYCIN HCL 500 MG/100ML IV SOLN
500.0000 mg | Freq: Two times a day (BID) | INTRAVENOUS | Status: DC
  Administered 2023-11-21 – 2023-11-22 (×3): 500 mg via INTRAVENOUS
  Filled 2023-11-21 (×4): qty 100

## 2023-11-21 MED ORDER — LACTATED RINGERS IV SOLN: INTRAVENOUS | Status: AC

## 2023-11-21 MED ORDER — METOCLOPRAMIDE HCL 5 MG/ML IJ SOLN
5.0000 mg | Freq: Three times a day (TID) | INTRAMUSCULAR | Status: DC
  Administered 2023-11-21: 5 mg via INTRAVENOUS
  Filled 2023-11-21: qty 2

## 2023-11-21 MED ORDER — BISACODYL 10 MG RE SUPP
10.0000 mg | Freq: Every day | RECTAL | Status: DC | PRN
  Filled 2023-11-21: qty 1

## 2023-11-21 MED ORDER — MAGNESIUM SULFATE 2 GM/50ML IV SOLN
2.0000 g | Freq: Once | INTRAVENOUS | Status: AC
  Administered 2023-11-21: 2 g via INTRAVENOUS
  Filled 2023-11-21: qty 50

## 2023-11-21 MED ORDER — PANTOPRAZOLE SODIUM 40 MG IV SOLR
40.0000 mg | Freq: Every day | INTRAVENOUS | Status: DC
  Administered 2023-11-21 – 2023-11-25 (×5): 40 mg via INTRAVENOUS
  Filled 2023-11-21 (×5): qty 10

## 2023-11-21 MED ORDER — SODIUM CHLORIDE 0.9 % IV SOLN
3.0000 g | Freq: Once | INTRAVENOUS | Status: AC
  Administered 2023-11-21: 3 g via INTRAVENOUS
  Filled 2023-11-21: qty 8

## 2023-11-21 MED ORDER — SODIUM CHLORIDE 0.9% FLUSH
3.0000 mL | Freq: Two times a day (BID) | INTRAVENOUS | Status: DC
  Administered 2023-11-21 – 2023-11-25 (×9): 3 mL via INTRAVENOUS

## 2023-11-21 MED ORDER — ENOXAPARIN SODIUM 40 MG/0.4ML IJ SOSY
40.0000 mg | PREFILLED_SYRINGE | INTRAMUSCULAR | Status: DC
  Administered 2023-11-21 – 2023-11-24 (×4): 40 mg via SUBCUTANEOUS
  Filled 2023-11-21 (×4): qty 0.4

## 2023-11-21 MED ORDER — SODIUM CHLORIDE 0.9 % IV SOLN
3.0000 g | Freq: Four times a day (QID) | INTRAVENOUS | Status: DC
  Administered 2023-11-21: 3 g via INTRAVENOUS
  Filled 2023-11-21 (×2): qty 8

## 2023-11-21 MED ORDER — ALBUTEROL SULFATE (2.5 MG/3ML) 0.083% IN NEBU
2.5000 mg | INHALATION_SOLUTION | RESPIRATORY_TRACT | Status: DC
  Administered 2023-11-21 – 2023-11-22 (×10): 2.5 mg via RESPIRATORY_TRACT
  Filled 2023-11-21 (×10): qty 3

## 2023-11-21 MED ORDER — BISACODYL 10 MG RE SUPP
10.0000 mg | Freq: Once | RECTAL | Status: AC
  Administered 2023-11-21: 10 mg via RECTAL
  Filled 2023-11-21: qty 1

## 2023-11-21 MED ORDER — BISACODYL 10 MG RE SUPP
10.0000 mg | Freq: Two times a day (BID) | RECTAL | Status: DC
  Administered 2023-11-22 – 2023-11-24 (×6): 10 mg via RECTAL
  Filled 2023-11-21 (×5): qty 1

## 2023-11-21 MED ORDER — SODIUM CHLORIDE 0.9 % IV SOLN: 3.0000 g | Freq: Three times a day (TID) | INTRAVENOUS | Status: DC

## 2023-11-21 MED ORDER — METOCLOPRAMIDE HCL 5 MG/ML IJ SOLN
10.0000 mg | Freq: Three times a day (TID) | INTRAMUSCULAR | Status: AC
  Administered 2023-11-21 – 2023-11-22 (×6): 10 mg via INTRAVENOUS
  Filled 2023-11-21 (×6): qty 2

## 2023-11-21 NOTE — Progress Notes (Signed)
Brief progress note: -Admitted with likely aspiration pneumonia/multifocal pneumonia, acute hypoxic/hypercapnic respiratory failure, lactic acidosis. -Patient remains on BiPAP. -Chronic medical problems includes Alzheimer's dementia, aphasia, dysphagia, neurogenic bladder, recurrent UTI, hypertension, history of peptic ulcer disease and aortic aneurysm. -Patient was seen alongside patient's husband and son, both ophthalmologists. -Patient is currently on IV vancomycin and Unasyn.  However, patient's son insists on Unasyn being changed to Zosyn. -Will change IV Unasyn to Zosyn. -Patient is critically ill.  As per H&P done earlier today: "Cheryl Monroe is a 89 y.o. female, nursing home resident [facility], with history of Alzheimer's dementia, progressive aphasia, dysphagia, neurogenic bladder with UTI, hypertension, aortic aneurysm, recent admission 1/17-23 for sepsis secondary to presumed UTI, who was brought in from nursing home due to acute decompensation and respiratory failure after witnessed vomiting event.  Patient became cyanotic, placed on a nonrebreather with EMS and in the emergency department maintained on BiPAP.    Per chart review and discussion with daughter that on 1/29 she was noted to have desaturations to 84% on room air the night prior, placed on 2 L oxygen.  Chest x-ray reportedly with left lower lobe pneumonia and they discussed with family who preferred observation.  Not started on antibiotics at that time.    Otherwise has not had recent illness to daughter's knowledge.  Her oral intake has been good and she likes to eat, maintains a dysphagia 3 diet.  Has been getting weight at the nursing home.  Her current level of function is very debilitated and mostly bedbound, transfers to and from wheelchair with [facility] lift.  She can recognize her children but otherwise has disorientation related to her Alzheimer's".    Guarded prognosis.

## 2023-11-21 NOTE — Progress Notes (Signed)
Pharmacy Antibiotic Note  Cheryl Monroe is a 89 y.o. female admitted on 11/20/2023 with gross aspiration event at SNF. PMH include recurrent UTIs with recent hospitalization for UTI on ceftriaxone/fluconazole.  Pharmacy has been consulted for vancomycin and Zosyn dosing for pneumonia.  WBC down to 10.2, sCr 0.74 (~bl 0.5-0.8), lactate 2.4, afebrile  Plan: Discontinue Unasyn Start Zosyn 3.375g Q8H Continue vancomycin 500 mg IV every 12 hours (AUC 496, Vd 0.72, IBW) F/u MRSA PCR Monitor renal function Follow up signs of clinical improvement, LOT, de-escalation of antibiotics   Temp (24hrs), Avg:98.4 F (36.9 C), Min:98 F (36.7 C), Max:99.1 F (37.3 C)  Recent Labs  Lab 11/20/23 1510 11/20/23 1518 11/20/23 1700 11/20/23 1804 11/20/23 2244 11/21/23 0406  WBC 14.3*  --   --   --   --  10.2  CREATININE 0.76  --  0.80  --   --  0.74  LATICACIDVEN  --  1.5  --  2.2* 2.9* 2.4*    Estimated Creatinine Clearance: 47.7 mL/min (by C-G formula based on SCr of 0.74 mg/dL).    Allergies  Allergen Reactions   Sulfa Antibiotics Rash    Antimicrobials this admission: Cefepime x1 Unasyn 1/30 >> 1/31 Vancomycin 1/31 >>  Zosyn 1/31 >>  Microbiology results: 1/30 BCx: ng < 24 hours 1/30 RPP (-) 1/31 MRSA PCR: to be collected  Thank you for allowing pharmacy to be a part of this patient's care.  Jenita Seashore 11/21/2023 2:51 PM

## 2023-11-21 NOTE — H&P (Signed)
History and Physical    Cheryl Monroe ZOX:[id number] DOB: 21-Jul-1935 DOA: 11/20/2023  PCP: Cheryl Gammon, MD   Patient coming from:  nursing home    Chief Complaint:  Chief Complaint  Patient presents with   Respiratory Distress    HPI: History provided by chart review and discussion with patient's daughter Cheryl Monroe is a 89 y.o. female, [facility], with history of Alzheimer's dementia, progressive aphasia, dysphagia, neurogenic bladder with UTI, hypertension, aortic aneurysm, recent admission 1/17-23 for sepsis secondary to presumed UTI, who was brought in from nursing home due to acute decompensation and respiratory failure after witnessed vomiting event.  Patient became cyanotic, placed on a nonrebreather with EMS and in the emergency department maintained on BiPAP.   Per chart review and discussion with daughter that on 1/29 she was noted to have desaturations to 84% on room air the night prior, placed on 2 L oxygen.  Chest x-ray reportedly with left lower lobe pneumonia and they discussed with family who preferred observation.  Not started on antibiotics at that time.   Otherwise has not had recent illness to daughter's knowledge.  Her oral intake has been good and she likes to eat, maintains a dysphagia 3 diet.  Has been getting weight at the nursing home.  Her current level of function is very debilitated and mostly bedbound, transfers to and from wheelchair with [facility] lift.  She can recognize her children but otherwise has disorientation related to her Alzheimer's.    Review of Systems:  ROS complete and negative except as marked above   Allergies  Allergen Reactions   Sulfa Antibiotics Rash    Prior to Admission medications   Medication Sig Start Date End Date Taking? Authorizing Provider  Cranberry (THERACRAN PO) Take 250 mg by mouth 2 (two) times daily.   Yes [provider]  hydrALAZINE (APRESOLINE) 10 MG tablet Take 10 mg by  mouth every 8 (eight) hours as needed (SBP > 170).   Yes [provider]  losartan (COZAAR) 25 MG tablet Take 25 mg by mouth daily.   Yes [provider]  nitrofurantoin (MACRODANTIN) 50 MG capsule Take 1 capsule (50 mg total) by mouth 2 (two) times daily. 11/14/23  Yes Cheryl Lloyd, MD  pantoprazole (PROTONIX) 20 MG tablet Take 1 tablet (20 mg total) by mouth 2 (two) times daily. 06/25/23  Yes Fargo, Amy E, NP  polyethylene glycol (MIRALAX / GLYCOLAX) 17 g packet Take 17 g by mouth every other day.   Yes [provider]  senna (SENOKOT) 8.6 MG TABS tablet Take 17.2 mg by mouth every evening.   Yes [provider]    Past Medical History:  Diagnosis Date   Alzheimer disease (HCC)    Balance problem 07/19/2020   Constipation 07/19/2020   Dementia without behavioral disturbance (HCC) 07/19/2020   MMSE 21/30 07/21/20   Fall    Osteoarthritis    Pyelonephritis    Spinal stenosis 07/19/2020   Weakness of left lower extremity 07/19/2020    No past surgical history on file.   reports that she has never smoked. She has never used smokeless tobacco. She reports that she does not currently use alcohol. She reports that she does not currently use drugs.  Family History  Problem Relation Age of Onset   ALS Mother      Physical Exam: Vitals:   11/21/23 0030 11/21/23 0045 11/21/23 0145 11/21/23 0345  BP: 122/66 127/67  119/70  Pulse: 76  78  75  Resp: (!) 25 (!) 26  (!) 22  Temp:   98 F (36.7 C)   TempSrc:   Axillary   SpO2: 100% 100%  99%    Gen: Somnolent, acutely ill, on BiPAP CV: Regular, normal S1, S2, no murmurs  Resp: Mild respiratory distress, tachypneic, no accessory muscle use, on BiPAP 22/8, 60% fio2.  Scattered rales anteriorly. Abd: Slightly distended over the epigastric region and hyperresonant, hypoactive, mildly tender to palpation.  No rebound, guarding, rigidity.   MSK: Symmetric, no edema  Skin: No rashes or lesions to exposed skin   Neuro: Somnolent, opens eyes to voice, moving extremities spontaneously. Psych: Unable to assess with her mental status.   Data review:   Labs reviewed, notable for:   Lactate 1.5 -> 2.9 VBG 7.28/62, improving to 7.3/57, bicarb 26. BNP 56 High-sensitivity Trop 8 -> 9 WBC 14  Micro:  Results for orders placed or performed during the hospital encounter of 11/20/23  Resp panel by RT-PCR (RSV, Flu A&B, Covid) Anterior Nasal Swab     Status: None   Collection Time: 11/20/23  2:42 PM   Specimen: Anterior Nasal Swab  Result Value Ref Range Status   SARS Coronavirus 2 by RT PCR NEGATIVE NEGATIVE Final   Influenza A by PCR NEGATIVE NEGATIVE Final   Influenza B by PCR NEGATIVE NEGATIVE Final    Comment: (NOTE) The Xpert Xpress SARS-CoV-2/FLU/RSV plus assay is intended as an aid in the diagnosis of influenza from Nasopharyngeal swab specimens and should not be used as a sole basis for treatment. Nasal washings and aspirates are unacceptable for Xpert Xpress SARS-CoV-2/FLU/RSV testing.  Fact Sheet for Patients: BloggerCourse.com  Fact Sheet for Healthcare Providers: SeriousBroker.it  This test is not yet approved or cleared by the [place] FDA and has been authorized for detection and/or diagnosis of SARS-CoV-2 by FDA under an Emergency Use Authorization (EUA). This EUA will remain in effect (meaning this test can be used) for the duration of the COVID-19 declaration under Section 564(b)(1) of the Act, 21 U.S.C. section 360bbb-3(b)(1), unless the authorization is terminated or revoked.     Resp Syncytial Virus by PCR NEGATIVE NEGATIVE Final    Comment: (NOTE) Fact Sheet for Patients: BloggerCourse.com  Fact Sheet for Healthcare Providers: SeriousBroker.it  This test is not yet approved or cleared by the [place] FDA and has been authorized for detection and/or  diagnosis of SARS-CoV-2 by FDA under an Emergency Use Authorization (EUA). This EUA will remain in effect (meaning this test can be used) for the duration of the COVID-19 declaration under Section 564(b)(1) of the Act, 21 U.S.C. section 360bbb-3(b)(1), unless the authorization is terminated or revoked.  Performed at [facility], [address], [place], [place] [address]     Imaging reviewed:  DG Abd 1 View Result Date: 11/21/2023 CLINICAL DATA:  Recent aspiration EXAM: ABDOMEN - 1 VIEW COMPARISON:  None Available. FINDINGS: Scattered large and small bowel gas is noted. No free air is seen. No abnormal mass or abnormal calcifications are noted. Contrast is noted within the bladder from recent CT. IMPRESSION: No acute abnormality noted. Electronically Signed   By: Alcide Clever M.D.   On: 11/21/2023 02:49   CT Angio Chest PE W and/or Wo Contrast Result Date: 11/20/2023 CLINICAL DATA:  Pulmonary embolism (PE) suspected, high prob. Leukocytosis EXAM: CT ANGIOGRAPHY CHEST WITH CONTRAST TECHNIQUE: Multidetector CT imaging of the chest was performed using the standard protocol during bolus administration of intravenous  contrast. Multiplanar CT image reconstructions and MIPs were obtained to evaluate the vascular anatomy. RADIATION DOSE REDUCTION: This exam was performed according to the departmental dose-optimization program which includes automated exposure control, adjustment of the mA and/or kV according to patient size and/or use of iterative reconstruction technique. CONTRAST:  65mL OMNIPAQUE IOHEXOL 350 MG/ML SOLN COMPARISON:  02/04/2023 FINDINGS: Cardiovascular: Satisfactory opacification of the pulmonary arteries to the segmental level. No evidence of pulmonary embolism. Mid ascending thoracic aorta measures 4.0 cm in diameter, stable. Scattered atherosclerotic vascular calcifications of the aorta and coronary arteries. Normal heart size. No pericardial effusion. Mediastinum/Nodes: No  axillary, mediastinal, or hilar lymphadenopathy 4.7 cm fluid attenuation nodule within the left thyroid lobe, unchanged. This results in rightward tracheal deviation. Esophagus within normal limits. Lungs/Pleura: Extensive patchy bilateral airspace consolidations, most pronounced within the perihilar regions. No pleural effusion or pneumothorax. Upper Abdomen: No acute abnormality. Musculoskeletal: No chest wall abnormality. No acute or significant osseous findings. Review of the MIP images confirms the above findings. IMPRESSION: 1. No evidence of pulmonary embolism. 2. Extensive patchy bilateral airspace consolidations, most pronounced within the perihilar regions. Findings are most compatible with multifocal pneumonia. Radiographic follow-up to resolution is recommended. 3. Stable 4.7 cm left thyroid nodule. In the setting of significant comorbidities or limited life expectancy, no follow-up recommended (ref: J Am Coll Radiol. 2015 Feb;12(2): 143-50). 4. Aortic and coronary artery atherosclerosis (ICD10-I70.0). Electronically Signed   By: Duanne Guess D.O.   On: 11/20/2023 21:53   DG Chest Port 1 View Result Date: 11/20/2023 CLINICAL DATA:  Concern for sepsis.  Vomiting. EXAM: PORTABLE CHEST 1 VIEW COMPARISON:  Chest radiograph dated June 24, 2023. FINDINGS: The heart size and mediastinal contours are within normal limits. Aortic atherosclerosis. Streaky opacities in the bilateral lower lungs, more pronounced on the right. No pleural effusion or pneumothorax. No acute osseous abnormality. IMPRESSION: Streaky opacities in the bilateral lower lungs, more pronounced on the right, could reflect atelectasis or infiltrate. Electronically Signed   By: Hart Robinsons M.D.   On: 11/20/2023 15:46    ED Course:  Placed on BiPAP in the ED. with concern for worsening respiratory status PCCM was consulted.  Recommendation to continue BiPAP at this time, feel no compelling indication for intubation.   Recommendation for Unasyn/vancomycin, aspiration precautions, elevation of head of bed.  PCCM to follow. Had initially been treated with vancomycin, cefepime -> changed to Unasyn   Assessment/Plan:  89 y.o. female with hx nursing [facility], with history of Alzheimer's dementia, progressive aphasia, dysphagia, neurogenic bladder with UTI, hypertension, aortic aneurysm, recent admission 1/17-23 for sepsis secondary to presumed UTI, who was brought in from nursing home due to acute decompensation and respiratory failure after witnessed vomiting event.  Acute hypoxic and hypercapnic respiratory failure Acute aspiration event, with aspiration pneumonia 1/29 noted to have desaturation and left lower lobe pneumonia, had not been started on antibiotics at that time.  Acute vomiting episode 1/30 and acutely developed respiratory failure cyanotic, and evaluation here revealing mixed hypoxic and hypercarbic respiratory failure.  She was placed on BiPAP in the ED for work of breathing; currently on 22/8, 60% FiO2 and breathing above set rate in the low 30s. Lactate 1.5 -> 2.9 after IV fluids and despite BiPAP. VBG 7.28/62, improving to 7.3/57, bicarb 26.  WBC 14.  CTA PE demonstrating extensive patchy bilateral airspace disease worse in the perihilar region consistent with multifocal pneumonia.  This is likely acute aspiration event with possible recent aspiration in days leading  up to this.  Her respiratory physiology concerning for significant dead space related to her pulmonary infiltrates.  -PCCM consulted by EDP Recommendation to continue BiPAP at this time, feel no compelling indication for intubation.  PCCM will continue follow.  Please contact them in the morning to ensure that they round on patient. - Continue vancomycin, Unasyn for aspiration pneumonia - MRSA nares, can discontinue vancomycin if negative - once stabilized off BiPAP Check RVP, sputum sample (not ordered at this time) -  Albuterol nebulizer every 4 hours scheduled, chest PT, I-S/flutter valve if able when off BiPAP - Aspiration precautions, elevate head of bed - Management of underlying aspiration risk per below. - Repeat VBG in the morning for trend; as hypercapnia and work of breathing improved would prefer her off BiPAP due to aspiration risk.  I discussed with patient's daughter that BiPAP carries risk of aspiration event but with her goals of care and current respiratory status prefer continued BiPAP therapy. -If remains on BiPAP will need maintenance IV fluid -Strict n.p.o. for now  Vomiting episode, question mild ileus No recent GI illness to knowledge. acute vomiting this morning leading to respiratory failure.  On exam abdomen slightly distended especially in epigastric area and hyperresonant here.  On review of her included upper abdomen on the CTA chest appears that she has slightly distended stomach with air and gastric contacts.  Suspect she may have mild ileus.  - Trial of Reglan 5 mg IV every 8 hours scheduled for nausea control and gastric motility. - Once breathing stable, give suppository and continue bowel regimen - Maintain electrolyte repletion's  Lactic acidosis Worsening lactic acidosis likely reflecting her respiratory work and hypoxic episode.  Initial worsening with IV fluids 1L.  Would hold on large-volume IV fluid until repeat lab studies in the morning. -Status post 1 L IV fluid.  Hold on large-volume IV fluid, feel respiratory/hypoxia driving increased. -Repeat lactate in the morning  Goals of care Patient has durable DNR and in discussions between EDP and patient's family initially unclear as to their thoughts on whether patient would want intubation if there was a reversible process.  Ultimately after family reviewed her advanced directive they feel she would not want intubation.  However I have also discussed with patient's daughter and family still feel that it is a gray area and  they think that patient may in some circumstances want intubation if there was a reversible process.  They have asked that if there is any worsening decompensation to the point where she would need intubation and that family would be called to determine if they ultimately want patient intubated.  For now she will continue as DNR/DNI. - For now maintain his DNR/DNI - If any respiratory decompensation call family, in certain circumstances may want intubation.  Chronic medical problems: History Alzheimer's dementia, aphasia: Not on medication Dysphagia: On dysphagia 3 diet when able to take p.o.'s Neurogenic bladder, recurrent UTI: Takes Macrobid for suppressive therapy, restart when able to take p.o. Hypertension: On losartan, hydralazine prn at facility; currently on hold History peptic ulcer disease: Switch PPI to IV History aortic aneurysm: Avoid fluoroquinolones, outpatient surveillance  There is no height or weight on file to calculate BMI.    DVT prophylaxis:  Lovenox Code Status:  DNR/DNI(Do NOT Intubate); see GOC above  Diet:  Diet Orders (From admission, onward)     Start     Ordered   11/21/23 0216  Diet NPO time specified  Diet effective now  11/21/23 0219           Family Communication:  Yes discussed with patient's daughter at the bedside Consults:  PCCM   Admission status:   Inpatient, Step Down Unit  Severity of Illness: The appropriate patient status for this patient is INPATIENT. Inpatient status is judged to be reasonable and necessary in order to provide the required intensity of service to ensure the patient's safety. The patient's presenting symptoms, physical exam findings, and initial radiographic and laboratory data in the context of their chronic comorbidities is felt to place them at high risk for further clinical deterioration. Furthermore, it is not anticipated that the patient will be medically stable for discharge from the hospital within 2 midnights  of admission.   * I certify that at the point of admission it is my clinical judgment that the patient will require inpatient hospital care spanning beyond 2 midnights from the point of admission due to high intensity of service, high risk for further deterioration and high frequency of surveillance required.*   Dolly Rias, MD [facility]  How to contact the [facility] Attending or Consulting provider 7A - 7P or covering provider during after hours 7P -7A, for this patient.  Check the care team in [facility] and look for a) attending/consulting [facility] provider listed and b) the [facility] team listed Log into [email or web address] and use [facility]'s universal password to access. If you do not have the password, please contact the hospital operator. Locate the [facility] provider you are looking for under [facility] and page to a number that you can be directly reached. If you still have difficulty reaching the provider, please page the [facility] (Director on Call) for the Hospitalists listed on amion for assistance.  11/21/2023, 4:55 AM

## 2023-11-21 NOTE — ED Notes (Signed)
Patient had very small soft stool patient cleaned and new linens applied.

## 2023-11-21 NOTE — Progress Notes (Signed)
Pharmacy Antibiotic Note  Cheryl Monroe is a 89 y.o. female admitted on 11/20/2023 with gross aspiration event at SNF. PMH include recurrent UTIs with recent hospitalization for UTI on ceftriaxone/fluconazole.  Pharmacy has been consulted for vancomycin dosing for pneumonia.  -WBC 14, sCr 0.8 (~bl 0.5-0.8), lactate 2.9, afebrile -cefepime/vanc x1 + unasyn x1 -CT chest: Streaky opacities in the bilateral lower lungs, more pronounced on the right, atelectasis vs infiltrate -Respiratory panel negative, no MRSA PCR  Plan: -Unasyn 3g IV every 6 hours -Vancomycin 1000 mg IV x1 -Vancomycin 500 mg IV every 12 hours (AUC 496, Vd 0.72, IBW) -Order MRSA PCR -Monitor renal function -Follow up signs of clinical improvement, LOT, de-escalation of antibiotics   Temp (24hrs), Avg:98.5 F (36.9 C), Min:98.3 F (36.8 C), Max:98.6 F (37 C)  Recent Labs  Lab 11/20/23 1510 11/20/23 1518 11/20/23 1700 11/20/23 1804 11/20/23 2244  WBC 14.3*  --   --   --   --   CREATININE 0.76  --  0.80  --   --   LATICACIDVEN  --  1.5  --  2.2* 2.9*    Estimated Creatinine Clearance: 47.7 mL/min (by C-G formula based on SCr of 0.8 mg/dL).    Allergies  Allergen Reactions   Sulfa Antibiotics Rash    Antimicrobials this admission: Cefepime x1 Unasyn 1/30 >>  Vancomycin 1/31 >>   Microbiology results: 1/30 BCx:  1/31 MRSA PCR:  Thank you for allowing pharmacy to be a part of this patient's care.  Fayrene Fearing [facility] 11/21/2023 1:31 AM

## 2023-11-21 NOTE — ED Provider Notes (Signed)
Had discussion regarding code status with family.  Her son and POA Dr. Gae Bon notes the decision to intubate may be nuanced depending on the data present at the time, however agree with family Dr. Betha Loa, and Rogelia Boga that she will be DNR/DNI.  Otherwise desires aggressive and appropriate care and further discussions may be had regarding interventions.   Alvira Monday, MD 11/21/23 [phone]

## 2023-11-21 NOTE — Progress Notes (Signed)
   NAME:  Cheryl Monroe, MRN:  [id number], DOB:  1935-08-21, LOS: 0 ADMISSION DATE:  11/20/2023, CONSULTATION DATE:  11/20/2023 REFERRING MD: Dr. Alvira Monday, CHIEF COMPLAINT: Gross aspiration  History of Present Illness:  This 89 year old Asian female seen in consultation at the request of Dr. Lafe Garin for recommendations on further evaluation and management of respiratory distress.  Specifically, she is called requesting recommendations on endotracheal intubation.  The patient is a resident from [facility].  She was apparently seen at [facility] for an acute outpatient visit yesterday (1 day prior to presentation) for a gross aspiration event after an episode of emesis.  She was observed to have room air SpO2 84-90%.  She was subsequently placed on supplemental oxygen at 2 LPM via nasal cannula with prompt improvement in SpO2 to 99%.  She was transferred to [facility] emergency department today due to concerns for possible aspiration pneumonia.  In the emergency department, the patient was noted to be hypoxic with increasing oxygen requirements.  She has been placed on BiPAP and is breathing comfortably.  She is hemodynamically stable.  Chest x-ray shows bilateral streaky opacities in the lower lungs, consistent with recent aspiration event.     Pertinent  Medical History  PAST MEDICAL HISTORY significant for Alzheimer's disease, balance problems, constipation, spinal stenosis, osteoarthritis, pyelonephritis.  She was recently hospitalized (1/17 - 1/23) for sepsis secondary to urinary tract infection.  She was treated with Rocephin/fluconazole IV.  Significant Hospital Events: Including procedures, antibiotic start and stop dates in addition to other pertinent events   1/30: Presented to ER 1 day after gross aspiration event at skilled nursing facility.  Placed on BiPAP for respiratory support.  Interim History / Subjective:  Maybe slightly more awake. VBG  improved gas exchange.  Objective   Blood pressure 109/65, pulse 75, temperature 99.1 F (37.3 C), temperature source Axillary, resp. rate (!) 23, SpO2 99%.    FiO2 (%):  [60 %] 60 % PEEP:  [8 cmH20] 8 cmH20   Intake/Output Summary (Last 24 hours) at 11/21/2023 0827 Last data filed at 11/21/2023 0220 Gross per 24 hour  Intake 100.47 ml  Output --  Net 100.47 ml   There were no vitals filed for this visit.  Examination: Somnolent on bipap Distant breath sounds Occasional purposeful movement Abd soft, hypoactive BS  CTA/KUB reviewed  Resolved Hospital Problem list   Not applicable  Assessment & Plan:  Acute hypoxemic and hypercarbic respiratory failure secondary to aspiration pneumonia Acute encephalopathy- hypercarbic, hypoxemic, ?septic Dementia- primary aphasia predominant Bedbound- progressive weakness, slowness, ?dizziness culminating in SNF decision to keep in bed w/ hoyer Question of ileus- KUB not that impressive  - Continue BIPAP, use mental status and WOB rather than ABG/VBG - If wakes up more can take breaks - Continue vanc/zosyn - Long talk with family: if does not turn around in 48h (Sunday) recommend considering transition to comfort care  Myrla Halsted MD PCCM

## 2023-11-22 ENCOUNTER — Inpatient Hospital Stay (HOSPITAL_COMMUNITY): Payer: Medicare Other

## 2023-11-22 DIAGNOSIS — R569 Unspecified convulsions: Secondary | ICD-10-CM | POA: Diagnosis not present

## 2023-11-22 DIAGNOSIS — G9340 Encephalopathy, unspecified: Secondary | ICD-10-CM | POA: Diagnosis not present

## 2023-11-22 DIAGNOSIS — J9601 Acute respiratory failure with hypoxia: Secondary | ICD-10-CM

## 2023-11-22 DIAGNOSIS — G4733 Obstructive sleep apnea (adult) (pediatric): Secondary | ICD-10-CM | POA: Diagnosis not present

## 2023-11-22 DIAGNOSIS — J189 Pneumonia, unspecified organism: Secondary | ICD-10-CM | POA: Diagnosis not present

## 2023-11-22 DIAGNOSIS — R112 Nausea with vomiting, unspecified: Secondary | ICD-10-CM | POA: Diagnosis not present

## 2023-11-22 LAB — MAGNESIUM: Magnesium: 2.8 mg/dL — ABNORMAL HIGH (ref 1.7–2.4)

## 2023-11-22 LAB — BLOOD GAS, VENOUS
Acid-Base Excess: 8.8 mmol/L — ABNORMAL HIGH (ref 0.0–2.0)
Bicarbonate: 34.1 mmol/L — ABNORMAL HIGH (ref 20.0–28.0)
O2 Saturation: 99.6 %
Patient temperature: 37.4
pCO2, Ven: 49 mm[Hg] (ref 44–60)
pH, Ven: 7.45 — ABNORMAL HIGH (ref 7.25–7.43)
pO2, Ven: 201 mm[Hg] — ABNORMAL HIGH (ref 32–45)

## 2023-11-22 LAB — BASIC METABOLIC PANEL
Anion gap: 12 (ref 5–15)
BUN: 27 mg/dL — ABNORMAL HIGH (ref 8–23)
CO2: 27 mmol/L (ref 22–32)
Calcium: 9.1 mg/dL (ref 8.9–10.3)
Chloride: 107 mmol/L (ref 98–111)
Creatinine, Ser: 0.89 mg/dL (ref 0.44–1.00)
GFR, Estimated: >60 mL/min (ref >60)
Glucose, Bld: 200 mg/dL — ABNORMAL HIGH (ref 70–99)
Potassium: 3.5 mmol/L (ref 3.5–5.1)
Sodium: 146 mmol/L — ABNORMAL HIGH (ref 135–145)

## 2023-11-22 LAB — CBC
HCT: 39 % (ref 36.0–46.0)
Hemoglobin: 11.8 g/dL — ABNORMAL LOW (ref 12.0–15.0)
MCH: 27.8 pg (ref 26.0–34.0)
MCHC: 30.3 g/dL (ref 30.0–36.0)
MCV: 92 fL (ref 80.0–100.0)
Platelets: 167 10*3/uL (ref 150–400)
RBC: 4.24 MIL/uL (ref 3.87–5.11)
RDW: 14.5 % (ref 11.5–15.5)
WBC: 11.6 10*3/uL — ABNORMAL HIGH (ref 4.0–10.5)
nRBC: 0 % (ref 0.0–0.2)

## 2023-11-22 LAB — AMMONIA: Ammonia: 29 umol/L (ref 9–35)

## 2023-11-22 LAB — PROCALCITONIN: Procalcitonin: 18.27 ng/mL

## 2023-11-22 LAB — MRSA NEXT GEN BY PCR, NASAL: MRSA by PCR Next Gen: NOT DETECTED

## 2023-11-22 MED ORDER — FUROSEMIDE 10 MG/ML IJ SOLN
40.0000 mg | Freq: Four times a day (QID) | INTRAMUSCULAR | Status: DC
  Administered 2023-11-22: 40 mg via INTRAVENOUS
  Filled 2023-11-22: qty 4

## 2023-11-22 MED ORDER — ALBUTEROL SULFATE (2.5 MG/3ML) 0.083% IN NEBU
2.5000 mg | INHALATION_SOLUTION | Freq: Four times a day (QID) | RESPIRATORY_TRACT | Status: DC
  Administered 2023-11-23 – 2023-11-25 (×8): 2.5 mg via RESPIRATORY_TRACT
  Filled 2023-11-22 (×9): qty 3

## 2023-11-22 MED ORDER — METHYLPREDNISOLONE SODIUM SUCC 125 MG IJ SOLR
60.0000 mg | Freq: Two times a day (BID) | INTRAMUSCULAR | Status: DC
  Administered 2023-11-22 – 2023-11-24 (×6): 60 mg via INTRAVENOUS
  Filled 2023-11-22 (×6): qty 2

## 2023-11-22 NOTE — Procedures (Signed)
Patient Name: TAKEESHA ISLEY  MRN: [id number]  Epilepsy Attending: Charlsie Quest  Referring Physician/Provider: Lorin Glass, MD Date: 11/22/2023 Duration: 24.09 mins  Patient history: 89yo F with ams. EEG to evaluate for seizure  Level of alertness: lethargic /sleep  AEDs during EEG study: None  Technical aspects: This EEG study was done with scalp electrodes positioned according to the 10-20 International system of electrode placement. Electrical activity was reviewed with band pass filter of 1-70Hz , sensitivity of 7 uV/mm, display speed of 63mm/sec with a 60Hz  notched filter applied as appropriate. EEG data were recorded continuously and digitally stored.  Video monitoring was available and reviewed as appropriate.  Description: EEG showed continuous generalized 3 to 6 Hz theta-delta slowing. Hyperventilation and photic stimulation were not performed.     ABNORMALITY - Continuous slow, generalized  IMPRESSION: This study is suggestive of moderate diffuse encephalopathy. No seizures or epileptiform discharges were seen throughout the recording.  Julee Stoll Annabelle Harman

## 2023-11-22 NOTE — Progress Notes (Signed)
11/22/2023 Will get a head CT. Tomorrow if still this encephalopathic going to have to talk about nutrition and GOC.  Myrla Halsted MD PCCM

## 2023-11-22 NOTE — Plan of Care (Signed)

## 2023-11-22 NOTE — Progress Notes (Signed)
RT note.  Patient transported from ED to [facility] 10 on bipap without any complications.

## 2023-11-22 NOTE — ED Notes (Signed)
RT at bedside performing chest PT

## 2023-11-22 NOTE — Progress Notes (Signed)
RT note. Patient transported on bipap from room 19 to 12 in ED without any complications.

## 2023-11-22 NOTE — Progress Notes (Signed)
SLP Cancellation Note  Patient Details Name: Cheryl Monroe MRN: [id number] DOB: 03/18/1935   Cancelled treatment:       Reason Eval/Treat Not Completed: Patient not medically ready. Pt on BiPAP. Will continue f/u.     Avie Echevaria, MA, CCC-SLP Acute Rehabilitation Services Office Number: [phone]  Paulette Blanch 11/22/2023, 9:47 AM

## 2023-11-22 NOTE — Progress Notes (Signed)
11/22/2023     I have seen and evaluated the patient for aspiration PNA, encephalopathy.   S:  A little more awake today but still very somnolent.   O: Blood pressure 96/66, pulse 64, temperature 97.6 F (36.4 C), temperature source Oral, resp. rate 17, height 5\' 3"  (1.6 m), weight 81.6 kg, SpO2 94 %.    No distress Minimal bipap leak Rhonci, mildly tachypneic but better than yesterday 1-2+ edema Moves with enough prompting x 4 Not vocalizing RASS -2  Na/BUN/Cr up Ammonia neg CBC stable CXR more conspicuous infiltrates c/w CT   A:  Acute hypoxemic and hypercarbic respiratory failure secondary to aspiration pneumonia Acute encephalopathy- hypercarbic, hypoxemic, ?septic Dementia- primary aphasia predominant Bedbound- progressive weakness, slowness, ?dizziness culminating in SNF decision to keep in bed w/ hoyer DNR   P:  - Check EEG, VBG - Continue abx - Add steroids - May consider CT head later today - Family updated at bedside - If continues to languish family may be interested in transition to comfort care given current clinical circumstances - Family also interested in hospice services on DC   Myrla Halsted MD [facility] Prefer epic messenger for cross cover needs If after hours, please call E-link

## 2023-11-22 NOTE — ED Notes (Signed)
Patient transferred to hospital bed, tolerated well, family at bedside.

## 2023-11-22 NOTE — Progress Notes (Signed)
STAT EEG complete - results pending. ? ?

## 2023-11-22 NOTE — Progress Notes (Signed)
PROGRESS NOTE    ANIELLA WANDREY  AOZ:[id number] DOB: 09/28/1935 DOA: 11/20/2023 PCP: Mahlon Gammon, MD  Outpatient Specialists:     Brief Narrative:  Patient is an 89 year old female, skilled nursing facility resident, with past medical history significant for dementia, progressive aphasia, dysphagia, neurogenic bladder, UTI, hypertension, and aortic aneurysm.  Patient was recently admitted from January 17 through November 13, 2023 with sepsis that is presumed to be secondary to UTI.  Patient has been admitted with aspiration pneumonia, acute combined hypoxic and hypercapnic respiratory failure (currently on BiPAP).  Pulmonary input is appreciated.  EEG has not revealed any seizures.  Patient is currently on IV Zosyn.  Patient is very ill.  11/22/2023: Patient seen alongside patient's husband, 2 sons and daughter-in-law.  Patient remains on BiPAP.  Patient remains encephalopathic.  No documented fever.  No hypotension.  Guarded prognosis.  Have low threshold to consider palliative care input.  Assessment & Plan:   Principal Problem:   Aspiration into airway Active Problems:   Multifocal pneumonia   Aspiration pneumonia (HCC)   Vomiting   Acute combined hypoxic/hypercapnic respiratory failure/aspiration pneumonia: -Continue BiPAP. -Pulmonary/critical care team is directing. -Continue IV Zosyn.  Encephalopathy: -Likely multifactorial. -On treatment for aspiration pneumonia. -Possible CT head is planned. -EEG is negative for seizures.  Chronic medical problems: History Alzheimer's dementia, aphasia: Not on medication Dysphagia: On dysphagia 3 diet when able to take p.o.'s Neurogenic bladder, recurrent UTI: Takes Macrobid for suppressive therapy, restart when able to take p.o. Hypertension: On losartan, hydralazine prn at facility; currently on hold History peptic ulcer disease:  History aortic aneurysm:    DVT prophylaxis: Subcutaneous Lovenox Code Status: Not  resuscitate Family Communication: Husband, 2 sons, daughter and daughter-in-law Disposition Plan: Patient remains inpatient.  Patient remains very ill.   Consultants:  Pulmonary/critical care team.  Procedures:  Patient is on BiPAP.  Antimicrobials:  IV Zosyn.   Subjective: No history from patient.  Objective: Vitals:   11/22/23 1242 11/22/23 1537 11/22/23 1600 11/22/23 2000  BP: (!) 159/93  (!) 146/78 (!) 158/76  Pulse: 87  89 76  Resp: (!) 25  (!) 23 20  Temp: 97.8 F (36.6 C)  98.1 F (36.7 C) 98.5 F (36.9 C)  TempSrc: Axillary  Axillary Axillary  SpO2: 99% 96% 97% 96%    Intake/Output Summary (Last 24 hours) at 11/22/2023 2013 Last data filed at 11/22/2023 2000 Gross per 24 hour  Intake 131.93 ml  Output 450 ml  Net -318.07 ml   There were no vitals filed for this visit.  Examination:  General exam: Patient is encephalopathic.  Patient is on BiPAP.  Minimal BiPAP leak.   Respiratory system: Decreased air entry. Cardiovascular system: S1 & S2  Gastrointestinal system: Abdomen is obese, soft and nontender.  Central nervous system: Patient remains encephalopathic.    Data Reviewed: I have personally reviewed following labs and imaging studies  CBC: Recent Labs  Lab 11/20/23 1510 11/20/23 1518 11/20/23 1834 11/20/23 2323 11/21/23 0406 11/21/23 0421 11/22/23 0319  WBC 14.3*  --   --   --  10.2  --  11.6*  NEUTROABS 11.8*  --   --   --   --   --   --   HGB 13.2   < > 13.6 13.6 13.3 13.9 11.8*  HCT 43.0   < > 40.0 40.0 43.6 41.0 39.0  MCV 91.3  --   --   --  92.2  --  92.0  PLT 206  --   --   --  157  --  167   < > = values in this interval not displayed.   Basic Metabolic Panel: Recent Labs  Lab 11/20/23 1510 11/20/23 1518 11/20/23 1700 11/20/23 1834 11/20/23 2323 11/21/23 0406 11/21/23 0421 11/22/23 0319  NA 142   < > 142 140 141 142 143 146*  K 4.5   < > 4.4 3.8 3.9 4.1 4.1 3.5  CL 105  --  107  --   --  104  --  107  CO2 26  --   --    --   --  28  --  27  GLUCOSE 163*  --  161*  --   --  217*  --  200*  BUN 20  --  26*  --   --  18  --  27*  CREATININE 0.76  --  0.80  --   --  0.74  --  0.89  CALCIUM 8.8*  --   --   --   --  8.8*  --  9.1  MG  --   --   --   --   --  1.9  --  2.8*  PHOS  --   --   --   --   --  3.1  --   --    < > = values in this interval not displayed.   GFR: Estimated Creatinine Clearance: 42.9 mL/min (by C-G formula based on SCr of 0.89 mg/dL). Liver Function Tests: Recent Labs  Lab 11/20/23 1510  AST 37  ALT 27  ALKPHOS 78  BILITOT 0.6  PROT 6.6  ALBUMIN 3.2*   No results for input(s): "LIPASE", "AMYLASE" in the last 168 hours. Recent Labs  Lab 11/22/23 0319  AMMONIA 29   Coagulation Profile: Recent Labs  Lab 11/20/23 1510  INR 1.0   Cardiac Enzymes: No results for input(s): "CKTOTAL", "CKMB", "CKMBINDEX", "TROPONINI" in the last 168 hours. BNP (last 3 results) No results for input(s): "PROBNP" in the last 8760 hours. HbA1C: No results for input(s): "HGBA1C" in the last 72 hours. CBG: No results for input(s): "GLUCAP" in the last 168 hours. Lipid Profile: No results for input(s): "CHOL", "HDL", "LDLCALC", "TRIG", "CHOLHDL", "LDLDIRECT" in the last 72 hours. Thyroid Function Tests: Recent Labs    11/21/23 0406  TSH 0.443   Anemia Panel: No results for input(s): "VITAMINB12", "FOLATE", "FERRITIN", "TIBC", "IRON", "RETICCTPCT" in the last 72 hours. Urine analysis:    Component Value Date/Time   COLORURINE YELLOW 11/21/2023 0136   APPEARANCEUR CLEAR 11/21/2023 0136   LABSPEC >1.046 (H) 11/21/2023 0136   LABSPEC 1.016 08/27/2022 0600   PHURINE 5.0 11/21/2023 0136   GLUCOSEU NEGATIVE 11/21/2023 0136   GLUCOSEU Normal 08/27/2022 0600   HGBUR NEGATIVE 11/21/2023 0136   HGBUR Negative 08/27/2022 0600   BILIRUBINUR NEGATIVE 11/21/2023 0136   BILIRUBINUR negative 08/27/2022 0600   KETONESUR NEGATIVE 11/21/2023 0136   PROTEINUR NEGATIVE 11/21/2023 0136   NITRITE  NEGATIVE 11/21/2023 0136   LEUKOCYTESUR NEGATIVE 11/21/2023 0136   Sepsis Labs: @LABRCNTIP (procalcitonin:4,lacticidven:4)  ) Recent Results (from the past 240 hours)  Resp panel by RT-PCR (RSV, Flu A&B, Covid) Anterior Nasal Swab     Status: None   Collection Time: 11/20/23  2:42 PM   Specimen: Anterior Nasal Swab  Result Value Ref Range Status   SARS Coronavirus 2 by RT PCR NEGATIVE NEGATIVE Final   Influenza A by PCR NEGATIVE NEGATIVE Final   Influenza B by PCR NEGATIVE NEGATIVE Final  Comment: (NOTE) The Xpert Xpress SARS-CoV-2/FLU/RSV plus assay is intended as an aid in the diagnosis of influenza from Nasopharyngeal swab specimens and should not be used as a sole basis for treatment. Nasal washings and aspirates are unacceptable for Xpert Xpress SARS-CoV-2/FLU/RSV testing.  Fact Sheet for Patients: BloggerCourse.com  Fact Sheet for Healthcare Providers: SeriousBroker.it  This test is not yet approved or cleared by the [place] FDA and has been authorized for detection and/or diagnosis of SARS-CoV-2 by FDA under an Emergency Use Authorization (EUA). This EUA will remain in effect (meaning this test can be used) for the duration of the COVID-19 declaration under Section 564(b)(1) of the Act, 21 U.S.C. section 360bbb-3(b)(1), unless the authorization is terminated or revoked.     Resp Syncytial Virus by PCR NEGATIVE NEGATIVE Final    Comment: (NOTE) Fact Sheet for Patients: BloggerCourse.com  Fact Sheet for Healthcare Providers: SeriousBroker.it  This test is not yet approved or cleared by the [place] FDA and has been authorized for detection and/or diagnosis of SARS-CoV-2 by FDA under an Emergency Use Authorization (EUA). This EUA will remain in effect (meaning this test can be used) for the duration of the COVID-19 declaration under Section 564(b)(1) of  the Act, 21 U.S.C. section 360bbb-3(b)(1), unless the authorization is terminated or revoked.  Performed at [facility], [address], [place], [place] [address]   Respiratory (~20 pathogens) panel by PCR     Status: None   Collection Time: 11/20/23  2:42 PM   Specimen: Nasopharyngeal Swab; Respiratory  Result Value Ref Range Status   Adenovirus NOT DETECTED NOT DETECTED Final   Coronavirus 229E NOT DETECTED NOT DETECTED Final    Comment: (NOTE) The Coronavirus on the Respiratory Panel, DOES NOT test for the novel  Coronavirus (2019 nCoV)    Coronavirus HKU1 NOT DETECTED NOT DETECTED Final   Coronavirus NL63 NOT DETECTED NOT DETECTED Final   Coronavirus OC43 NOT DETECTED NOT DETECTED Final   Metapneumovirus NOT DETECTED NOT DETECTED Final   Rhinovirus / Enterovirus NOT DETECTED NOT DETECTED Final   Influenza A NOT DETECTED NOT DETECTED Final   Influenza B NOT DETECTED NOT DETECTED Final   Parainfluenza Virus 1 NOT DETECTED NOT DETECTED Final   Parainfluenza Virus 2 NOT DETECTED NOT DETECTED Final   Parainfluenza Virus 3 NOT DETECTED NOT DETECTED Final   Parainfluenza Virus 4 NOT DETECTED NOT DETECTED Final   Respiratory Syncytial Virus NOT DETECTED NOT DETECTED Final   Bordetella pertussis NOT DETECTED NOT DETECTED Final   Bordetella Parapertussis NOT DETECTED NOT DETECTED Final   Chlamydophila pneumoniae NOT DETECTED NOT DETECTED Final   Mycoplasma pneumoniae NOT DETECTED NOT DETECTED Final    Comment: Performed at [facility], [address], [place], [place] [address]  Blood Culture (routine x 2)     Status: None (Preliminary result)   Collection Time: 11/20/23  3:38 PM   Specimen: BLOOD  Result Value Ref Range Status   Specimen Description BLOOD RIGHT ANTECUBITAL  Final   Special Requests   Final    BOTTLES DRAWN AEROBIC AND ANAEROBIC Blood Culture results may not be optimal due to an inadequate volume of blood received in culture bottles   Culture   Final     NO GROWTH 2 DAYS Performed at [facility], [address] [place], [place] [address]    Report Status PENDING  Incomplete  Blood Culture (routine x 2)     Status: None (Preliminary result)  Collection Time: 11/20/23  3:50 PM   Specimen: BLOOD  Result Value Ref Range Status   Specimen Description BLOOD LEFT ANTECUBITAL  Final   Special Requests   Final    BOTTLES DRAWN AEROBIC AND ANAEROBIC Blood Culture results may not be optimal due to an inadequate volume of blood received in culture bottles   Culture   Final    NO GROWTH 2 DAYS Performed at [facility], [address], [place], [place] [address]    Report Status PENDING  Incomplete  MRSA Next Gen by PCR, Nasal     Status: None   Collection Time: 11/21/23  5:40 AM   Specimen: Nasal Mucosa; Nasal Swab  Result Value Ref Range Status   MRSA by PCR Next Gen NOT DETECTED NOT DETECTED Final    Comment: (NOTE) The GeneXpert MRSA Assay (FDA approved for NASAL specimens only), is one component of a comprehensive MRSA colonization surveillance program. It is not intended to diagnose MRSA infection nor to guide or monitor treatment for MRSA infections. Test performance is not FDA approved in patients less than 32 years old. Performed at [facility], [address], [place], [place] [address]          Radiology Studies: CT HEAD WO CONTRAST ( ) Result Date: 11/22/2023 CLINICAL DATA:  Mental status change, unknown cause. EXAM: CT HEAD WITHOUT CONTRAST TECHNIQUE: Contiguous axial images were obtained from the base of the skull through the vertex without intravenous contrast. RADIATION DOSE REDUCTION: This exam was performed according to the departmental dose-optimization program which includes automated exposure control, adjustment of the mA and/or kV according to patient size and/or use of iterative reconstruction technique. COMPARISON:  Head CT 05/31/2023 FINDINGS: Brain: There is no evidence of an acute  infarct, intracranial hemorrhage, mass, midline shift, or extra-axial fluid collection. Patchy hypodensities in the cerebral white matter are unchanged and nonspecific but compatible with moderate chronic small vessel ischemic disease. There is mild cerebral atrophy. Vascular: Calcified atherosclerosis at the skull base. No hyperdense vessel. Skull: No acute fracture or suspicious osseous lesion. Sinuses/Orbits: Visualized paranasal sinuses and mastoid air cells are clear. Bilateral cataract extraction. Other: None. IMPRESSION: 1. No evidence of acute intracranial abnormality. 2. Moderate chronic small vessel ischemic disease. Electronically Signed   By: Sebastian Ache M.D.   On: 11/22/2023 16:56   EEG adult Result Date: 11/22/2023 Charlsie Quest, MD     11/22/2023 10:03 AM Patient Name: ALLANTE BEANE MRN: [id number] Epilepsy Attending: Charlsie Quest Referring Physician/Provider: Lorin Glass, MD Date: 11/22/2023 Duration: 24.09 mins Patient history: 89yo F with ams. EEG to evaluate for seizure Level of alertness: lethargic /sleep AEDs during EEG study: None Technical aspects: This EEG study was done with scalp electrodes positioned according to the 10-20 International system of electrode placement. Electrical activity was reviewed with band pass filter of 1-70Hz , sensitivity of 7 uV/mm, display speed of 65mm/sec with a 60Hz  notched filter applied as appropriate. EEG data were recorded continuously and digitally stored.  Video monitoring was available and reviewed as appropriate. Description: EEG showed continuous generalized 3 to 6 Hz theta-delta slowing. Hyperventilation and photic stimulation were not performed.   ABNORMALITY - Continuous slow, generalized IMPRESSION: This study is suggestive of moderate diffuse encephalopathy. No seizures or epileptiform discharges were seen throughout the recording. Charlsie Quest   DG Chest Port 1 View Result Date: 11/22/2023 CLINICAL DATA:  89 year old female with  history of respiratory distress concerning for pneumonia. EXAM: PORTABLE  CHEST 1 VIEW COMPARISON:  Chest x-ray 11/20/2023. FINDINGS: Lung volumes are low. Widespread but patchy areas of interstitial prominence, peribronchial cuffing and patchy ill-defined opacities are noted throughout the lungs bilaterally, most confluent in the left upper lobe and right lung base. No definite pleural effusions. No pneumothorax. No evidence of pulmonary edema. Heart size is upper limits of normal. The patient is rotated to the right on today's exam, resulting in distortion of the mediastinal contours and reduced diagnostic sensitivity and specificity for mediastinal pathology. Atherosclerotic calcifications in the thoracic aorta. IMPRESSION: 1. The appearance of the chest is concerning for multilobar bilateral pneumonia, as above. 2. Aortic atherosclerosis. Electronically Signed   By: Trudie Reed M.D.   On: 11/22/2023 07:29   DG Abd 1 View Result Date: 11/21/2023 CLINICAL DATA:  Recent aspiration EXAM: ABDOMEN - 1 VIEW COMPARISON:  None Available. FINDINGS: Scattered large and small bowel gas is noted. No free air is seen. No abnormal mass or abnormal calcifications are noted. Contrast is noted within the bladder from recent CT. IMPRESSION: No acute abnormality noted. Electronically Signed   By: Alcide Clever M.D.   On: 11/21/2023 02:49   CT Angio Chest PE W and/or Wo Contrast Result Date: 11/20/2023 CLINICAL DATA:  Pulmonary embolism (PE) suspected, high prob. Leukocytosis EXAM: CT ANGIOGRAPHY CHEST WITH CONTRAST TECHNIQUE: Multidetector CT imaging of the chest was performed using the standard protocol during bolus administration of intravenous contrast. Multiplanar CT image reconstructions and MIPs were obtained to evaluate the vascular anatomy. RADIATION DOSE REDUCTION: This exam was performed according to the departmental dose-optimization program which includes automated exposure control, adjustment of the mA and/or  kV according to patient size and/or use of iterative reconstruction technique. CONTRAST:  65mL OMNIPAQUE IOHEXOL 350 MG/ML SOLN COMPARISON:  02/04/2023 FINDINGS: Cardiovascular: Satisfactory opacification of the pulmonary arteries to the segmental level. No evidence of pulmonary embolism. Mid ascending thoracic aorta measures 4.0 cm in diameter, stable. Scattered atherosclerotic vascular calcifications of the aorta and coronary arteries. Normal heart size. No pericardial effusion. Mediastinum/Nodes: No axillary, mediastinal, or hilar lymphadenopathy 4.7 cm fluid attenuation nodule within the left thyroid lobe, unchanged. This results in rightward tracheal deviation. Esophagus within normal limits. Lungs/Pleura: Extensive patchy bilateral airspace consolidations, most pronounced within the perihilar regions. No pleural effusion or pneumothorax. Upper Abdomen: No acute abnormality. Musculoskeletal: No chest wall abnormality. No acute or significant osseous findings. Review of the MIP images confirms the above findings. IMPRESSION: 1. No evidence of pulmonary embolism. 2. Extensive patchy bilateral airspace consolidations, most pronounced within the perihilar regions. Findings are most compatible with multifocal pneumonia. Radiographic follow-up to resolution is recommended. 3. Stable 4.7 cm left thyroid nodule. In the setting of significant comorbidities or limited life expectancy, no follow-up recommended (ref: J Am Coll Radiol. 2015 Feb;12(2): 143-50). 4. Aortic and coronary artery atherosclerosis (ICD10-I70.0). Electronically Signed   By: Duanne Guess D.O.   On: 11/20/2023 21:53        Scheduled Meds:  albuterol  2.5 mg Nebulization Q4H   bisacodyl  10 mg Rectal BID   enoxaparin (LOVENOX) injection  40 mg Subcutaneous Q24H   methylPREDNISolone (SOLU-MEDROL) injection  60 mg Intravenous Q12H   metoCLOPramide (REGLAN) injection  10 mg Intravenous Q8H   pantoprazole (PROTONIX) IV  40 mg Intravenous  Daily   sodium chloride flush  3 mL Intravenous Q12H   Continuous Infusions:  piperacillin-tazobactam (ZOSYN)  IV 3.375 g (11/22/23 1434)     LOS: 1 day    Time spent: 49  minutes    Berton Mount, MD  [facility] Pager #: [phone] 7PM-7AM contact night coverage as above

## 2023-11-22 NOTE — Progress Notes (Signed)
   11/22/23 2032  BiPAP/CPAP/SIPAP  BiPAP/CPAP/SIPAP Pt Type Adult  Mask Type Full face mask  Mask Size Medium  Set Rate 20 breaths/min (increased per tv being low)  Respiratory Rate 23 breaths/min  IPAP 24 cmH20  EPAP 8 cmH2O  FiO2 (%) 40 %  Minute Ventilation 9.4  Leak 0  Peak Inspiratory Pressure (PIP) 24  Tidal Volume (Vt) 438  Patient Home Equipment No  Auto Titrate No  Press High Alarm 30 cmH2O  Press Low Alarm 5 cmH2O   Increased per pt needing more TV pt is not alert

## 2023-11-22 DEATH — deceased

## 2023-11-23 ENCOUNTER — Inpatient Hospital Stay (HOSPITAL_COMMUNITY): Payer: Medicare Other

## 2023-11-23 DIAGNOSIS — T17908D Unspecified foreign body in respiratory tract, part unspecified causing other injury, subsequent encounter: Secondary | ICD-10-CM

## 2023-11-23 DIAGNOSIS — J9601 Acute respiratory failure with hypoxia: Secondary | ICD-10-CM | POA: Diagnosis not present

## 2023-11-23 DIAGNOSIS — J189 Pneumonia, unspecified organism: Secondary | ICD-10-CM | POA: Diagnosis not present

## 2023-11-23 DIAGNOSIS — G4733 Obstructive sleep apnea (adult) (pediatric): Secondary | ICD-10-CM | POA: Diagnosis not present

## 2023-11-23 DIAGNOSIS — R112 Nausea with vomiting, unspecified: Secondary | ICD-10-CM | POA: Diagnosis not present

## 2023-11-23 LAB — GLUCOSE, CAPILLARY
Glucose-Capillary: 160 mg/dL — ABNORMAL HIGH (ref 70–99)
Glucose-Capillary: 173 mg/dL — ABNORMAL HIGH (ref 70–99)
Glucose-Capillary: 180 mg/dL — ABNORMAL HIGH (ref 70–99)
Glucose-Capillary: 182 mg/dL — ABNORMAL HIGH (ref 70–99)
Glucose-Capillary: 185 mg/dL — ABNORMAL HIGH (ref 70–99)
Glucose-Capillary: 192 mg/dL — ABNORMAL HIGH (ref 70–99)

## 2023-11-23 LAB — BLOOD GAS, ARTERIAL
Acid-Base Excess: 15 mmol/L — ABNORMAL HIGH (ref 0.0–2.0)
Bicarbonate: 40.6 mmol/L — ABNORMAL HIGH (ref 20.0–28.0)
Drawn by: 40415
O2 Saturation: 99.3 %
Patient temperature: 37
pCO2 arterial: 52 mm[Hg] — ABNORMAL HIGH (ref 32–48)
pH, Arterial: 7.5 — ABNORMAL HIGH (ref 7.35–7.45)
pO2, Arterial: 103 mm[Hg] (ref 83–108)

## 2023-11-23 LAB — CBC
HCT: 37.5 % (ref 36.0–46.0)
Hemoglobin: 11.5 g/dL — ABNORMAL LOW (ref 12.0–15.0)
MCH: 27.7 pg (ref 26.0–34.0)
MCHC: 30.7 g/dL (ref 30.0–36.0)
MCV: 90.4 fL (ref 80.0–100.0)
Platelets: 180 10*3/uL (ref 150–400)
RBC: 4.15 MIL/uL (ref 3.87–5.11)
RDW: 14.1 % (ref 11.5–15.5)
WBC: 11.7 10*3/uL — ABNORMAL HIGH (ref 4.0–10.5)
nRBC: 0 % (ref 0.0–0.2)

## 2023-11-23 LAB — BASIC METABOLIC PANEL
Anion gap: 11 (ref 5–15)
BUN: 22 mg/dL (ref 8–23)
CO2: 32 mmol/L (ref 22–32)
Calcium: 9.2 mg/dL (ref 8.9–10.3)
Chloride: 105 mmol/L (ref 98–111)
Creatinine, Ser: 0.75 mg/dL (ref 0.44–1.00)
GFR, Estimated: >60 mL/min (ref >60)
Glucose, Bld: 176 mg/dL — ABNORMAL HIGH (ref 70–99)
Potassium: 3.1 mmol/L — ABNORMAL LOW (ref 3.5–5.1)
Sodium: 148 mmol/L — ABNORMAL HIGH (ref 135–145)

## 2023-11-23 LAB — MAGNESIUM: Magnesium: 2.5 mg/dL — ABNORMAL HIGH (ref 1.7–2.4)

## 2023-11-23 LAB — HEMOGLOBIN A1C
Hgb A1c MFr Bld: 6.5 % — ABNORMAL HIGH (ref 4.8–5.6)
Mean Plasma Glucose: 139.85 mg/dL

## 2023-11-23 LAB — PROCALCITONIN: Procalcitonin: 8.96 ng/mL

## 2023-11-23 LAB — AMMONIA: Ammonia: 40 umol/L — ABNORMAL HIGH (ref 9–35)

## 2023-11-23 LAB — C-REACTIVE PROTEIN: CRP: 17.3 mg/dL — ABNORMAL HIGH (ref <1.0)

## 2023-11-23 LAB — BRAIN NATRIURETIC PEPTIDE: B Natriuretic Peptide: 302 pg/mL — ABNORMAL HIGH (ref 0.0–100.0)

## 2023-11-23 MED ORDER — HYDRALAZINE HCL 20 MG/ML IJ SOLN
10.0000 mg | Freq: Three times a day (TID) | INTRAMUSCULAR | Status: DC | PRN
  Administered 2023-11-23: 10 mg via INTRAVENOUS
  Filled 2023-11-23: qty 1

## 2023-11-23 MED ORDER — DEXTROSE 5 % IV SOLN
INTRAVENOUS | Status: AC
Start: 2023-11-23 — End: 2023-11-24

## 2023-11-23 MED ORDER — ACETAMINOPHEN 650 MG RE SUPP
650.0000 mg | Freq: Three times a day (TID) | RECTAL | Status: DC | PRN
  Administered 2023-11-23 – 2023-11-25 (×5): 650 mg via RECTAL
  Filled 2023-11-23 (×5): qty 1

## 2023-11-23 MED ORDER — INSULIN ASPART 100 UNIT/ML IJ SOLN
0.0000 [IU] | INTRAMUSCULAR | Status: DC
  Administered 2023-11-23 – 2023-11-24 (×5): 3 [IU] via SUBCUTANEOUS
  Administered 2023-11-24: 2 [IU] via SUBCUTANEOUS
  Administered 2023-11-24: 3 [IU] via SUBCUTANEOUS
  Administered 2023-11-24: 2 [IU] via SUBCUTANEOUS

## 2023-11-23 MED ORDER — HYDRALAZINE HCL 20 MG/ML IJ SOLN
10.0000 mg | Freq: Four times a day (QID) | INTRAMUSCULAR | Status: DC | PRN
Start: 2023-11-23 — End: 2023-11-25
  Administered 2023-11-23 (×2): 10 mg via INTRAVENOUS
  Filled 2023-11-23 (×2): qty 1

## 2023-11-23 MED ORDER — NITROGLYCERIN 2 % TD OINT
1.0000 [in_us] | TOPICAL_OINTMENT | Freq: Four times a day (QID) | TRANSDERMAL | Status: DC
  Administered 2023-11-23 – 2023-11-25 (×8): 1 [in_us] via TOPICAL
  Filled 2023-11-23: qty 30

## 2023-11-23 MED ORDER — MORPHINE SULFATE (PF) 2 MG/ML IV SOLN
1.0000 mg | INTRAVENOUS | Status: DC | PRN
  Administered 2023-11-23: 1 mg via INTRAVENOUS
  Administered 2023-11-24: 0.5 mg via INTRAVENOUS
  Filled 2023-11-23 (×2): qty 1

## 2023-11-23 MED ORDER — POTASSIUM CHLORIDE 10 MEQ/100ML IV SOLN
10.0000 meq | INTRAVENOUS | Status: AC
  Administered 2023-11-23 (×4): 10 meq via INTRAVENOUS
  Filled 2023-11-23 (×4): qty 100

## 2023-11-23 MED ORDER — THIAMINE HCL 100 MG/ML IJ SOLN
500.0000 mg | Freq: Three times a day (TID) | INTRAVENOUS | Status: AC
  Administered 2023-11-23 – 2023-11-25 (×6): 500 mg via INTRAVENOUS
  Filled 2023-11-23 (×6): qty 5

## 2023-11-23 NOTE — Progress Notes (Signed)
PT Cancellation Note  Patient Details Name: Cheryl Monroe MRN: [id number] DOB: 03-27-1935   Cancelled Treatment:    Reason Eval/Treat Not Completed: Fatigue/lethargy limiting ability to participate;Medical issues which prohibited therapy. Pt unresponsive and on bipap.   Angelina Ok [facility] 11/23/2023, 9:17 AM Skip Mayer PT Acute [employer] [phone]

## 2023-11-23 NOTE — Progress Notes (Addendum)
11/23/2023 Seen and examined Ongoing waxing/waning mentation ABG alkalemic Exam unchanged nonfocal CT and EEG neg Ammonia minimally elevated Pct downtrending Spoke with family at bedside: we will try HHFNC, no real role to try to force early nutrition here; can try some empiric high dose thiamine. Except for lungs and CNS, other organs still functioning They would like a few more days to hope for recovery; if declines we likely need to move this timeline up. Continue abx, hope for the best but prepare for the worst.  Myrla Halsted MD PCCM     NAME:  Cheryl Monroe, MRN:  [id number], DOB:  Sep 19, 1935, LOS: 2 ADMISSION DATE:  11/20/2023, CONSULTATION DATE:  11/20/2023 REFERRING MD: Dr. Alvira Monday, CHIEF COMPLAINT: Gross aspiration  History of Present Illness:  This 89 year old Asian female seen in consultation at the request of Dr. Lafe Garin for recommendations on further evaluation and management of respiratory distress.  Specifically, she is called requesting recommendations on endotracheal intubation.  The patient is a resident from [facility].  She was apparently seen at [facility] for an acute outpatient visit yesterday (1 day prior to presentation) for a gross aspiration event after an episode of emesis.  She was observed to have room air SpO2 84-90%.  She was subsequently placed on supplemental oxygen at 2 LPM via nasal cannula with prompt improvement in SpO2 to 99%.  She was transferred to [facility] emergency department today due to concerns for possible aspiration pneumonia.  In the emergency department, the patient was noted to be hypoxic with increasing oxygen requirements.  She has been placed on BiPAP and is breathing comfortably.  She is hemodynamically stable.  Chest x-ray shows bilateral streaky opacities in the lower lungs, consistent with recent aspiration event.     Pertinent  Medical History  PAST MEDICAL HISTORY significant for  Alzheimer's disease, balance problems, constipation, spinal stenosis, osteoarthritis, pyelonephritis.  She was recently hospitalized (1/17 - 1/23) for sepsis secondary to urinary tract infection.  She was treated with Rocephin/fluconazole IV.  Significant Hospital Events: Including procedures, antibiotic start and stop dates in addition to other pertinent events   1/30: Presented to ER 1 day after gross aspiration event at skilled nursing facility.  Placed on BiPAP for respiratory support. 2/1 remains BiPAP dependent 2/2 again remains BiPAP dependent with persistent hypercapnia.  Mentation remains poor but daughter did report that she was able to awake for FaceTime with family yesterday afternoon  Interim History / Subjective:  Patient is seen lying in bed on BiPAP with oxygen saturations 96% on 40% FiO2.  Daughter at bedside  Objective   Blood pressure (!) 162/68, pulse 80, temperature (!) 101.1 F (38.4 C), temperature source Oral, resp. rate 20, SpO2 96%.    FiO2 (%):  [30 %-40 %] 40 %   Intake/Output Summary (Last 24 hours) at 11/23/2023 1057 Last data filed at 11/23/2023 0448 Gross per 24 hour  Intake --  Output 650 ml  Net -650 ml   There were no vitals filed for this visit.  Examination: General: Acute on chronic ill-appearing deconditioned elderly female lying in bed on BiPAP in no acute distress HEENT: BiPAP mask in place, MM pink/moist, PERRL,  Neuro: Will arouse to verbal stimuli but is somnolent path CV: s1s2 regular rate and rhythm, no murmur, rubs, or gallops,  PULM: Diminished air entry bilaterally, no increased work of breathing, tolerating BiPAP currently, 40% FiO2 GI: soft, bowel sounds active in all 4 quadrants, non-tender, non-distended Extremities:  warm/dry, no edema  Skin: no rashes or lesions   Resolved Hospital Problem list   Not applicable  Assessment & Plan:  Acute hypoxemic and hypercarbic respiratory failure -Secondary to aspiration pneumonia Acute  encephalopathy  -Hypercarbic, hypoxemic, ?septic Dementia -Primary aphasia predominant Bedbound -Progressive weakness, slowness, ?dizziness culminating in SNF decision to keep in bed w/ hoyer Question of ileus -KUB not that impressive P: Patient continues to remain BiPAP dependent after 48 hours with little to no improvement.  Given deconditioned frail state at baseline family is actively discussing goals of care.  Daughter understands expected trajectory and has made arrangements for her brothers to arrive at bedside today to have further discussion on timing of transition to comfort care.  At this time continue BiPAP until formal goals of care discussion can be held today at noon   [place] D. Harris, NP-C [facility] Personal contact information can be found on Amion  If no contact or response made please call [phone] 11/23/2023, 10:59 AM

## 2023-11-23 NOTE — Progress Notes (Signed)
PROGRESS NOTE                                                                                                                                                                                                             Patient Demographics:    Cheryl Monroe, is a 89 y.o. female, DOB - Jun 09, 1935, ZOX:[id number]  Outpatient Primary MD for the patient is Mahlon Gammon, MD    LOS - 2  Admit date - 11/20/2023    Chief Complaint  Patient presents with   Respiratory Distress       Brief Narrative (HPI from H&P)    89 year old female, skilled nursing facility resident, with past medical history significant for dementia, progressive aphasia, dysphagia, neurogenic bladder, UTI, hypertension, and aortic aneurysm.  Patient was recently admitted from January 17 through November 13, 2023 with sepsis that is presumed to be secondary to UTI.  Patient has been admitted with aspiration pneumonia, acute combined hypoxic and hypercapnic respiratory failure (currently on BiPAP).  Pulmonary input is appreciated.  EEG has not revealed any seizures.  She has continued to be unresponsive on IV antibiotics, BiPAP with gradually worsening pneumonia.  Prognosis is poor.     Subjective:    Cheryl Monroe today remains in bed bed, unresponsive, moves all 4 extremities to painful stimuli, appears to be in no distress wearing BiPAP   Assessment  & Plan :   Acute combined hypoxic/hypercapnic respiratory failure/aspiration pneumonia in a patient with advanced dementia recent admission for UTI and poor functional status at baseline:  She is on IV antibiotics and BiPAP since admission on 11/20/2023, still unresponsive, still running low-grade fever suggesting ongoing microaspiration of oral secretions somewhat worsened by being on BiPAP.  Per family wishes continue IV antibiotics and BiPAP, add IV fluids, they understand that her prognosis is very poor but want to  give her a 5-day run of fluids, antibiotics and BiPAP.  They understand that she could be getting worse.  Appreciate PCCM input as well.   Acute metabolic and toxic encephalopathy:  Head CT and EEG unremarkable, no focal deficits, has underlying advanced Alzheimer's dementia with underlying expressive aphasia.  Poor functional status to begin with.  Supportive care.   History Alzheimer's dementia, aphasia: Not on medication, as above.  Dysphagia: Currently n.p.o. due to decreased mental status, if improved then SLP.  Neurogenic bladder, recurrent UTI: Takes Macrobid for suppressive therapy, restart when able to take p.o.  Hypertension: As needed IV hydralazine, nitroglycerin paste and monitor  History peptic ulcer disease: Place on IV PPI  History aortic aneurysm: Outpatient monitoring.  Hypernatremia and dehydration.  Discussed with family they want IV fluids, explained to them the physiology of third spacing and possible worsening respiratory status, son and daughter are physicians they understand but want to give her a chance with IV fluids.        Condition - Extremely Guarded  Family Communication  :  daughter bedside, son Cheryl Monroe (son): [phone]   Code Status :  DNR  Consults  :   PCCM, Pall care   PUD Prophylaxis :     Procedures  :            Disposition Plan  :    Status is: Inpatient   DVT Prophylaxis  :    Place TED hose Start: 11/22/23 0807 Place and maintain sequential compression device Start: 11/22/23 0706 enoxaparin (LOVENOX) injection 40 mg Start: 11/21/23 1400    Lab Results  Component Value Date   PLT 167 11/22/2023    Diet :  Diet Order             Diet NPO time specified  Diet effective now                    Inpatient Medications  Scheduled Meds:  albuterol  2.5 mg Nebulization Q6H   bisacodyl  10 mg Rectal BID   enoxaparin (LOVENOX) injection  40 mg Subcutaneous Q24H   methylPREDNISolone (SOLU-MEDROL) injection  60  mg Intravenous Q12H   pantoprazole (PROTONIX) IV  40 mg Intravenous Daily   sodium chloride flush  3 mL Intravenous Q12H   Continuous Infusions:  piperacillin-tazobactam (ZOSYN)  IV 3.375 g (11/23/23 0448)   PRN Meds:.acetaminophen, bisacodyl, hydrALAZINE    Objective:   Vitals:   11/23/23 0445 11/23/23 0500 11/23/23 0517 11/23/23 0820  BP: (!) 182/84 (!) 153/75  (!) 162/68  Pulse:    80  Resp:  15 20 20   Temp:   98.9 F (37.2 C) (!) 101.1 F (38.4 C)  TempSrc:   Axillary Oral  SpO2:  97% 96% 96%    Wt Readings from Last 3 Encounters:  11/19/23 74.5 kg  11/17/23 74.5 kg  11/08/23 74.3 kg     Intake/Output Summary (Last 24 hours) at 11/23/2023 0841 Last data filed at 11/23/2023 0448 Gross per 24 hour  Intake 131.93 ml  Output 650 ml  Net -518.07 ml     Physical Exam  Sleeping in bed appears to be in no distress but overall unresponsive and wearing BiPAP, no focal deficits on brief neuroexam, withdraws to painful stimuli in all 4 extremities [place].AT,PERRAL Supple Neck, No JVD,   Symmetrical Chest wall movement, coarse bilateral breath sounds RRR,No Gallops,Rubs or new Murmurs,  +ve B.Sounds, Abd Soft, No tenderness,   No Cyanosis, Clubbing or edema       Data Review:    Recent Labs  Lab 11/20/23 1510 11/20/23 1518 11/20/23 1834 11/20/23 2323 11/21/23 0406 11/21/23 0421 11/22/23 0319  WBC 14.3*  --   --   --  10.2  --  11.6*  HGB 13.2   < > 13.6 13.6 13.3 13.9 11.8*  HCT 43.0   < > 40.0 40.0 43.6 41.0 39.0  PLT 206  --   --   --  157  --  167  MCV 91.3  --   --   --  92.2  --  92.0  MCH 28.0  --   --   --  28.1  --  27.8  MCHC 30.7  --   --   --  30.5  --  30.3  RDW 13.7  --   --   --  14.0  --  14.5  LYMPHSABS 1.2  --   --   --   --   --   --   MONOABS 0.8  --   --   --   --   --   --   EOSABS 0.2  --   --   --   --   --   --   BASOSABS 0.1  --   --   --   --   --   --    < > = values in this interval not displayed.    Recent Labs  Lab  11/20/23 1510 11/20/23 1518 11/20/23 1700 11/20/23 1749 11/20/23 1804 11/20/23 1834 11/20/23 2244 11/20/23 2323 11/21/23 0406 11/21/23 0421 11/22/23 0319  NA 142 142 142  --   --  140  --  141 142 143 146*  K 4.5 4.4 4.4  --   --  3.8  --  3.9 4.1 4.1 3.5  CL 105  --  107  --   --   --   --   --  104  --  107  CO2 26  --   --   --   --   --   --   --  28  --  27  ANIONGAP 11  --   --   --   --   --   --   --  10  --  12  GLUCOSE 163*  --  161*  --   --   --   --   --  217*  --  200*  BUN 20  --  26*  --   --   --   --   --  18  --  27*  CREATININE 0.76  --  0.80  --   --   --   --   --  0.74  --  0.89  AST 37  --   --   --   --   --   --   --   --   --   --   ALT 27  --   --   --   --   --   --   --   --   --   --   ALKPHOS 78  --   --   --   --   --   --   --   --   --   --   BILITOT 0.6  --   --   --   --   --   --   --   --   --   --   ALBUMIN 3.2*  --   --   --   --   --   --   --   --   --   --   DDIMER  --   --   --  0.67*  --   --   --   --   --   --   --   PROCALCITON  --   --   --   --   --   --   --   --   --   --  18.27  LATICACIDVEN  --  1.5  --   --  2.2*  --  2.9*  --  2.4*  --   --   INR 1.0  --   --   --   --   --   --   --   --   --   --   TSH  --   --   --   --   --   --   --   --  0.443  --   --   AMMONIA  --   --   --   --   --   --   --   --   --   --  29  BNP 56.8  --   --   --   --   --   --   --  81.6  --   --   MG  --   --   --   --   --   --   --   --  1.9  --  2.8*  PHOS  --   --   --   --   --   --   --   --  3.1  --   --   CALCIUM 8.8*  --   --   --   --   --   --   --  8.8*  --  9.1      Recent Labs  Lab 11/20/23 1510 11/20/23 1518 11/20/23 1749 11/20/23 1804 11/20/23 2244 11/21/23 0406 11/22/23 0319  DDIMER  --   --  0.67*  --   --   --   --   PROCALCITON  --   --   --   --   --   --  18.27  LATICACIDVEN  --  1.5  --  2.2* 2.9* 2.4*  --   INR 1.0  --   --   --   --   --   --   TSH  --   --   --   --   --  0.443  --   AMMONIA  --   --    --   --   --   --  29  BNP 56.8  --   --   --   --  81.6  --   MG  --   --   --   --   --  1.9 2.8*  CALCIUM 8.8*  --   --   --   --  8.8* 9.1   --------------------------------------------------------------------------------------------------------------- Lab Results  Component Value Date   CHOL 235 (A) 07/20/2020   HDL 100 (A) 07/20/2020   LDLCALC 134 07/20/2020   TRIG 102 07/20/2020    No results found for: "HGBA1C" Recent Labs    11/21/23 0406  TSH 0.443     Micro Results Recent Results (from the past 240 hours)  Resp panel by RT-PCR (RSV, Flu A&B, Covid) Anterior Nasal Swab     Status: None   Collection Time: 11/20/23  2:42 PM   Specimen: Anterior Nasal Swab  Result Value Ref Range Status   SARS Coronavirus 2 by RT PCR NEGATIVE NEGATIVE Final   Influenza A by PCR NEGATIVE NEGATIVE Final   Influenza B by PCR NEGATIVE NEGATIVE Final    Comment: (NOTE) The Xpert Xpress SARS-CoV-2/FLU/RSV plus assay is intended as an aid in the diagnosis of influenza  from Nasopharyngeal swab specimens and should not be used as a sole basis for treatment. Nasal washings and aspirates are unacceptable for Xpert Xpress SARS-CoV-2/FLU/RSV testing.  Fact Sheet for Patients: BloggerCourse.com  Fact Sheet for Healthcare Providers: SeriousBroker.it  This test is not yet approved or cleared by the [place] FDA and has been authorized for detection and/or diagnosis of SARS-CoV-2 by FDA under an Emergency Use Authorization (EUA). This EUA will remain in effect (meaning this test can be used) for the duration of the COVID-19 declaration under Section 564(b)(1) of the Act, 21 U.S.C. section 360bbb-3(b)(1), unless the authorization is terminated or revoked.     Resp Syncytial Virus by PCR NEGATIVE NEGATIVE Final    Comment: (NOTE) Fact Sheet for Patients: BloggerCourse.com  Fact Sheet for Healthcare  Providers: SeriousBroker.it  This test is not yet approved or cleared by the [place] FDA and has been authorized for detection and/or diagnosis of SARS-CoV-2 by FDA under an Emergency Use Authorization (EUA). This EUA will remain in effect (meaning this test can be used) for the duration of the COVID-19 declaration under Section 564(b)(1) of the Act, 21 U.S.C. section 360bbb-3(b)(1), unless the authorization is terminated or revoked.  Performed at [facility], [address], [place], [place] [address]   Respiratory (~20 pathogens) panel by PCR     Status: None   Collection Time: 11/20/23  2:42 PM   Specimen: Nasopharyngeal Swab; Respiratory  Result Value Ref Range Status   Adenovirus NOT DETECTED NOT DETECTED Final   Coronavirus 229E NOT DETECTED NOT DETECTED Final    Comment: (NOTE) The Coronavirus on the Respiratory Panel, DOES NOT test for the novel  Coronavirus (2019 nCoV)    Coronavirus HKU1 NOT DETECTED NOT DETECTED Final   Coronavirus NL63 NOT DETECTED NOT DETECTED Final   Coronavirus OC43 NOT DETECTED NOT DETECTED Final   Metapneumovirus NOT DETECTED NOT DETECTED Final   Rhinovirus / Enterovirus NOT DETECTED NOT DETECTED Final   Influenza A NOT DETECTED NOT DETECTED Final   Influenza B NOT DETECTED NOT DETECTED Final   Parainfluenza Virus 1 NOT DETECTED NOT DETECTED Final   Parainfluenza Virus 2 NOT DETECTED NOT DETECTED Final   Parainfluenza Virus 3 NOT DETECTED NOT DETECTED Final   Parainfluenza Virus 4 NOT DETECTED NOT DETECTED Final   Respiratory Syncytial Virus NOT DETECTED NOT DETECTED Final   Bordetella pertussis NOT DETECTED NOT DETECTED Final   Bordetella Parapertussis NOT DETECTED NOT DETECTED Final   Chlamydophila pneumoniae NOT DETECTED NOT DETECTED Final   Mycoplasma pneumoniae NOT DETECTED NOT DETECTED Final    Comment: Performed at [facility], [address], [place], [place] [address]  Blood Culture  (routine x 2)     Status: None (Preliminary result)   Collection Time: 11/20/23  3:38 PM   Specimen: BLOOD  Result Value Ref Range Status   Specimen Description BLOOD RIGHT ANTECUBITAL  Final   Special Requests   Final    BOTTLES DRAWN AEROBIC AND ANAEROBIC Blood Culture results may not be optimal due to an inadequate volume of blood received in culture bottles   Culture   Final    NO GROWTH 2 DAYS Performed at [facility], [address], [place], [place] [address]    Report Status PENDING  Incomplete  Blood Culture (routine x 2)     Status: None (Preliminary result)   Collection Time: 11/20/23  3:50 PM   Specimen: BLOOD  Result Value Ref Range Status  Specimen Description BLOOD LEFT ANTECUBITAL  Final   Special Requests   Final    BOTTLES DRAWN AEROBIC AND ANAEROBIC Blood Culture results may not be optimal due to an inadequate volume of blood received in culture bottles   Culture   Final    NO GROWTH 2 DAYS Performed at [facility], [address], [place], [place] [address]    Report Status PENDING  Incomplete  MRSA Next Gen by PCR, Nasal     Status: None   Collection Time: 11/21/23  5:40 AM   Specimen: Nasal Mucosa; Nasal Swab  Result Value Ref Range Status   MRSA by PCR Next Gen NOT DETECTED NOT DETECTED Final    Comment: (NOTE) The GeneXpert MRSA Assay (FDA approved for NASAL specimens only), is one component of a comprehensive MRSA colonization surveillance program. It is not intended to diagnose MRSA infection nor to guide or monitor treatment for MRSA infections. Test performance is not FDA approved in patients less than 11 years old. Performed at [facility], [address] [place], [place] [address]     Radiology Report DG Abd Portable 1V Result Date: 11/23/2023 CLINICAL DATA:  89 year old female with history of constipation. EXAM: PORTABLE ABDOMEN - 1 VIEW COMPARISON:  Abdominal radiograph 11/21/2023. FINDINGS: Gas and stool are seen  scattered throughout the colon extending to the level of the distal rectum. No pathologic distension of small bowel is noted. Stool burden does not appear excessive. No gross evidence of pneumoperitoneum. IMPRESSION: 1. Nonobstructive bowel gas pattern. 2. Stool burden does not appear excessive. Electronically Signed   By: Trudie Reed M.D.   On: 11/23/2023 07:50   DG Chest Port 1 View Result Date: 11/23/2023 CLINICAL DATA:  89 year old female with history of shortness of breath. EXAM: PORTABLE CHEST 1 VIEW COMPARISON:  Chest x-ray 11/22/2023. FINDINGS: Lung volumes are normal. Diffuse interstitial prominence, widespread peribronchial cuffing and patchy ill-defined opacities are noted throughout the lungs bilaterally, most evident in the left upper lobe and right mid to lower lung, concerning for bronchitis with multilobar bilateral bronchopneumonia. No pleural effusions. No pneumothorax. No evidence of pulmonary edema. Heart size is normal. Upper mediastinal contours are within normal limits. IMPRESSION: 1. The appearance of the chest is concerning for bronchitis with multilobar bilateral bronchopneumonia, as above. Electronically Signed   By: Trudie Reed M.D.   On: 11/23/2023 07:49   CT HEAD WO CONTRAST ( ) Result Date: 11/22/2023 CLINICAL DATA:  Mental status change, unknown cause. EXAM: CT HEAD WITHOUT CONTRAST TECHNIQUE: Contiguous axial images were obtained from the base of the skull through the vertex without intravenous contrast. RADIATION DOSE REDUCTION: This exam was performed according to the departmental dose-optimization program which includes automated exposure control, adjustment of the mA and/or kV according to patient size and/or use of iterative reconstruction technique. COMPARISON:  Head CT 05/31/2023 FINDINGS: Brain: There is no evidence of an acute infarct, intracranial hemorrhage, mass, midline shift, or extra-axial fluid collection. Patchy hypodensities in the cerebral white  matter are unchanged and nonspecific but compatible with moderate chronic small vessel ischemic disease. There is mild cerebral atrophy. Vascular: Calcified atherosclerosis at the skull base. No hyperdense vessel. Skull: No acute fracture or suspicious osseous lesion. Sinuses/Orbits: Visualized paranasal sinuses and mastoid air cells are clear. Bilateral cataract extraction. Other: None. IMPRESSION: 1. No evidence of acute intracranial abnormality. 2. Moderate chronic small vessel ischemic disease. Electronically Signed   By: Sebastian Ache M.D.   On: 11/22/2023 16:56   EEG  adult Result Date: 11/22/2023 Charlsie Quest, MD     11/22/2023 10:03 AM Patient Name: CYNTHYA YAM MRN: [id number] Epilepsy Attending: Charlsie Quest Referring Physician/Provider: Lorin Glass, MD Date: 11/22/2023 Duration: 24.09 mins Patient history: 89yo F with ams. EEG to evaluate for seizure Level of alertness: lethargic /sleep AEDs during EEG study: None Technical aspects: This EEG study was done with scalp electrodes positioned according to the 10-20 International system of electrode placement. Electrical activity was reviewed with band pass filter of 1-70Hz , sensitivity of 7 uV/mm, display speed of 66mm/sec with a 60Hz  notched filter applied as appropriate. EEG data were recorded continuously and digitally stored.  Video monitoring was available and reviewed as appropriate. Description: EEG showed continuous generalized 3 to 6 Hz theta-delta slowing. Hyperventilation and photic stimulation were not performed.   ABNORMALITY - Continuous slow, generalized IMPRESSION: This study is suggestive of moderate diffuse encephalopathy. No seizures or epileptiform discharges were seen throughout the recording. Charlsie Quest   DG Chest Port 1 View Result Date: 11/22/2023 CLINICAL DATA:  89 year old female with history of respiratory distress concerning for pneumonia. EXAM: PORTABLE CHEST 1 VIEW COMPARISON:  Chest x-ray 11/20/2023. FINDINGS:  Lung volumes are low. Widespread but patchy areas of interstitial prominence, peribronchial cuffing and patchy ill-defined opacities are noted throughout the lungs bilaterally, most confluent in the left upper lobe and right lung base. No definite pleural effusions. No pneumothorax. No evidence of pulmonary edema. Heart size is upper limits of normal. The patient is rotated to the right on today's exam, resulting in distortion of the mediastinal contours and reduced diagnostic sensitivity and specificity for mediastinal pathology. Atherosclerotic calcifications in the thoracic aorta. IMPRESSION: 1. The appearance of the chest is concerning for multilobar bilateral pneumonia, as above. 2. Aortic atherosclerosis. Electronically Signed   By: Trudie Reed M.D.   On: 11/22/2023 07:29   DG Abd 1 View Result Date: 11/21/2023 CLINICAL DATA:  Recent aspiration EXAM: ABDOMEN - 1 VIEW COMPARISON:  None Available. FINDINGS: Scattered large and small bowel gas is noted. No free air is seen. No abnormal mass or abnormal calcifications are noted. Contrast is noted within the bladder from recent CT. IMPRESSION: No acute abnormality noted. Electronically Signed   By: Alcide Clever M.D.   On: 11/21/2023 02:49   CT Angio Chest PE W and/or Wo Contrast Result Date: 11/20/2023 CLINICAL DATA:  Pulmonary embolism (PE) suspected, high prob. Leukocytosis EXAM: CT ANGIOGRAPHY CHEST WITH CONTRAST TECHNIQUE: Multidetector CT imaging of the chest was performed using the standard protocol during bolus administration of intravenous contrast. Multiplanar CT image reconstructions and MIPs were obtained to evaluate the vascular anatomy. RADIATION DOSE REDUCTION: This exam was performed according to the departmental dose-optimization program which includes automated exposure control, adjustment of the mA and/or kV according to patient size and/or use of iterative reconstruction technique. CONTRAST:  65mL OMNIPAQUE IOHEXOL 350 MG/ML SOLN  COMPARISON:  02/04/2023 FINDINGS: Cardiovascular: Satisfactory opacification of the pulmonary arteries to the segmental level. No evidence of pulmonary embolism. Mid ascending thoracic aorta measures 4.0 cm in diameter, stable. Scattered atherosclerotic vascular calcifications of the aorta and coronary arteries. Normal heart size. No pericardial effusion. Mediastinum/Nodes: No axillary, mediastinal, or hilar lymphadenopathy 4.7 cm fluid attenuation nodule within the left thyroid lobe, unchanged. This results in rightward tracheal deviation. Esophagus within normal limits. Lungs/Pleura: Extensive patchy bilateral airspace consolidations, most pronounced within the perihilar regions. No pleural effusion or pneumothorax. Upper Abdomen: No acute abnormality. Musculoskeletal: No chest wall abnormality. No acute  or significant osseous findings. Review of the MIP images confirms the above findings. IMPRESSION: 1. No evidence of pulmonary embolism. 2. Extensive patchy bilateral airspace consolidations, most pronounced within the perihilar regions. Findings are most compatible with multifocal pneumonia. Radiographic follow-up to resolution is recommended. 3. Stable 4.7 cm left thyroid nodule. In the setting of significant comorbidities or limited life expectancy, no follow-up recommended (ref: J Am Coll Radiol. 2015 Feb;12(2): 143-50). 4. Aortic and coronary artery atherosclerosis (ICD10-I70.0). Electronically Signed   By: Duanne Guess D.O.   On: 11/20/2023 21:53   DG Chest Port 1 View Result Date: 11/20/2023 CLINICAL DATA:  Concern for sepsis.  Vomiting. EXAM: PORTABLE CHEST 1 VIEW COMPARISON:  Chest radiograph dated June 24, 2023. FINDINGS: The heart size and mediastinal contours are within normal limits. Aortic atherosclerosis. Streaky opacities in the bilateral lower lungs, more pronounced on the right. No pleural effusion or pneumothorax. No acute osseous abnormality. IMPRESSION: Streaky opacities in the  bilateral lower lungs, more pronounced on the right, could reflect atelectasis or infiltrate. Electronically Signed   By: Hart Robinsons M.D.   On: 11/20/2023 15:46   CT ABDOMEN PELVIS W CONTRAST Result Date: 11/08/2023 CLINICAL DATA:  Bowel obstruction EXAM: CT ABDOMEN AND PELVIS WITH CONTRAST TECHNIQUE: Multidetector CT imaging of the abdomen and pelvis was performed using the standard protocol following bolus administration of intravenous contrast. RADIATION DOSE REDUCTION: This exam was performed according to the departmental dose-optimization program which includes automated exposure control, adjustment of the mA and/or kV according to patient size and/or use of iterative reconstruction technique. CONTRAST:  75mL OMNIPAQUE IOHEXOL 350 MG/ML SOLN COMPARISON:  None Available. FINDINGS: Lower chest: Extensive multi-vessel coronary artery calcification. Global cardiac size within normal limits. There is dilation of the ascending thoracic aorta which measures at least 4.0 cm in diameter and is incompletely visualized on this examination. This is similar, however, to prior examination 02/04/2023. Bibasilar dependent atelectasis. Hepatobiliary: Evaluation of the upper abdominal solid organs is limited by respiratory motion artifact. Allowing for this, the liver and gallbladder are unremarkable. No intra or extrahepatic biliary ductal dilation. Pancreas: Unremarkable Spleen: Unremarkable Adrenals/Urinary Tract: The adrenal glands are unremarkable. Mild asymmetric right renal cortical atrophy. The kidneys are normal in position. Mild asymmetric dilation of the right renal pelvis with decompression of the right ureter suggests a mild UPJ obstruction. No intrarenal or ureteral calculi. No hydronephrosis on the left. The bladder is unremarkable. Stomach/Bowel: Stomach is within normal limits. Appendix appears normal. No evidence of bowel wall thickening, distention, or inflammatory changes. 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 Moderate atherosclerotic calcification within the abdominal aorta. No aortic aneurysm. No pathologic adenopathy within the abdomen and pelvis. Reproductive: Pessary in place. Pelvic organs are otherwise unremarkable. Other: Diastasis of the rectus abdominus musculature noted. No abdominopelvic ascites Musculoskeletal: The osseous structures are age-appropriate. No acute bone abnormality IMPRESSION: 1. No acute intra-abdominal pathology identified. No definite radiographic explanation for the patient's reported symptoms. 2. Extensive multi-vessel coronary artery calcification. 3. Dilation of the ascending thoracic aorta which measures at least 4.0 cm in diameter and is incompletely visualized on this examination. This is similar, however, to prior examination 02/04/2023. Follow-up recommendations are as outlined on prior examination. 4. 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 5. Dilation of the left gonadal vein with bilateral adnexal varices identified suggesting changes of ovarian vein reflux and pelvic venous insufficiency.  Aortic Atherosclerosis (ICD10-I70.0). Electronically Signed   By: Helyn Numbers M.D.   On: 11/08/2023 01:21     Signature  -   Susa Raring M.D on 11/23/2023 at 8:41 AM   -  To page go to [email or web address]

## 2023-11-23 NOTE — Progress Notes (Signed)
RT called to room because patient had increased WOB.  Upon entering patient was using accessory muscles.  Spoke with family at bedside and suggested that placing the patient back on BiPAP would help with the WOB since patient was already on 40 L 40% HHFNC.  Family agreed that placing patient back on BiPAP throughout the night would be okay and would try HHFNC in the AM.  Made RN aware of the plan.  RT will continue to monitor.

## 2023-11-23 NOTE — Progress Notes (Signed)
   11/23/23 0046  BiPAP/CPAP/SIPAP  $ Non-Invasive Ventilator  Non-Invasive Vent Subsequent  BiPAP/CPAP/SIPAP Pt Type Adult  BiPAP/CPAP/SIPAP V60  Mask Type Full face mask  Mask Size Medium  Set Rate 20 breaths/min  Respiratory Rate 20 breaths/min  IPAP 24 cmH20  EPAP 8 cmH2O  FiO2 (%) 40 %  Minute Ventilation 10  Leak 0  Peak Inspiratory Pressure (PIP) 24  Tidal Volume (Vt) 536  Patient Home Equipment No  Auto Titrate No  Press High Alarm 30 cmH2O  Press Low Alarm 5 cmH2O   Pt is on above settings

## 2023-11-23 NOTE — Plan of Care (Signed)

## 2023-11-23 NOTE — Progress Notes (Signed)
SLP Cancellation Note  Patient Details Name: Cheryl Monroe MRN: [id number] DOB: May 14, 1935   Cancelled treatment:       Reason Eval/Treat Not Completed: Patient not medically ready.  Pt remains on BiPAP this morning.  SLP will continue to f/u for readiness for clinical swallow evaluation.    Shanon Rosser [name] 11/23/2023, 8:39 AM

## 2023-11-24 DIAGNOSIS — G9340 Encephalopathy, unspecified: Secondary | ICD-10-CM

## 2023-11-24 DIAGNOSIS — T17908D Unspecified foreign body in respiratory tract, part unspecified causing other injury, subsequent encounter: Secondary | ICD-10-CM | POA: Diagnosis not present

## 2023-11-24 DIAGNOSIS — J9602 Acute respiratory failure with hypercapnia: Secondary | ICD-10-CM

## 2023-11-24 DIAGNOSIS — J189 Pneumonia, unspecified organism: Secondary | ICD-10-CM | POA: Diagnosis not present

## 2023-11-24 DIAGNOSIS — J9601 Acute respiratory failure with hypoxia: Secondary | ICD-10-CM | POA: Diagnosis not present

## 2023-11-24 LAB — GLUCOSE, CAPILLARY
Glucose-Capillary: 107 mg/dL — ABNORMAL HIGH (ref 70–99)
Glucose-Capillary: 119 mg/dL — ABNORMAL HIGH (ref 70–99)
Glucose-Capillary: 123 mg/dL — ABNORMAL HIGH (ref 70–99)
Glucose-Capillary: 124 mg/dL — ABNORMAL HIGH (ref 70–99)
Glucose-Capillary: 164 mg/dL — ABNORMAL HIGH (ref 70–99)
Glucose-Capillary: 192 mg/dL — ABNORMAL HIGH (ref 70–99)

## 2023-11-24 LAB — BASIC METABOLIC PANEL
Anion gap: 9 (ref 5–15)
BUN: 24 mg/dL — ABNORMAL HIGH (ref 8–23)
CO2: 30 mmol/L (ref 22–32)
Calcium: 9.2 mg/dL (ref 8.9–10.3)
Chloride: 106 mmol/L (ref 98–111)
Creatinine, Ser: 0.72 mg/dL (ref 0.44–1.00)
GFR, Estimated: >60 mL/min (ref >60)
Glucose, Bld: 201 mg/dL — ABNORMAL HIGH (ref 70–99)
Potassium: 3.3 mmol/L — ABNORMAL LOW (ref 3.5–5.1)
Sodium: 145 mmol/L (ref 135–145)

## 2023-11-24 LAB — CBC
HCT: 39 % (ref 36.0–46.0)
Hemoglobin: 12.1 g/dL (ref 12.0–15.0)
MCH: 28.2 pg (ref 26.0–34.0)
MCHC: 31 g/dL (ref 30.0–36.0)
MCV: 90.9 fL (ref 80.0–100.0)
Platelets: 192 10*3/uL (ref 150–400)
RBC: 4.29 MIL/uL (ref 3.87–5.11)
RDW: 14.1 % (ref 11.5–15.5)
WBC: 12 10*3/uL — ABNORMAL HIGH (ref 4.0–10.5)
nRBC: 0 % (ref 0.0–0.2)

## 2023-11-24 LAB — MAGNESIUM: Magnesium: 2.4 mg/dL (ref 1.7–2.4)

## 2023-11-24 MED ORDER — MORPHINE SULFATE (PF) 2 MG/ML IV SOLN
1.0000 mg | INTRAVENOUS | Status: DC | PRN
  Administered 2023-11-24: 2 mg via INTRAVENOUS
  Administered 2023-11-24: 1 mg via INTRAVENOUS
  Administered 2023-11-24: 2 mg via INTRAVENOUS
  Administered 2023-11-24 (×2): 1 mg via INTRAVENOUS
  Administered 2023-11-24: 0.5 mg via INTRAVENOUS
  Administered 2023-11-24: 1 mg via INTRAVENOUS
  Administered 2023-11-25: 2 mg via INTRAVENOUS
  Filled 2023-11-24 (×8): qty 1

## 2023-11-24 MED ORDER — LACTATED RINGERS IV SOLN: INTRAVENOUS | Status: DC

## 2023-11-24 MED ORDER — POTASSIUM CHLORIDE 10 MEQ/100ML IV SOLN
10.0000 meq | INTRAVENOUS | Status: AC
Start: 2023-11-24 — End: 2023-11-24
  Administered 2023-11-24 (×3): 10 meq via INTRAVENOUS
  Filled 2023-11-24 (×3): qty 100

## 2023-11-24 NOTE — Progress Notes (Signed)
SLP Cancellation Note  Patient Details Name: Cheryl Monroe MRN: [id number] DOB: 11-03-1934   Cancelled treatment:       Reason Eval/Treat Not Completed: Patient not medically ready. Pt transitioned to [facility] this morning, but per MD is not currently ready to participate in a swallowing evaluation. SLP will continue following.    Gwynneth Aliment, M.A., CF-SLP Speech Language Pathology, Acute Rehabilitation Services  Secure Chat preferred [phone]  11/24/2023, 11:14 AM

## 2023-11-24 NOTE — Progress Notes (Addendum)
PROGRESS NOTE                                                                                                                                                                                                             Patient Demographics:    Cheryl Monroe, is a 89 y.o. female, DOB - 01-21-1935, ZOX:[id number]  Outpatient Primary MD for the patient is Mahlon Gammon, MD    LOS - 3  Admit date - 11/20/2023    Chief Complaint  Patient presents with   Respiratory Distress       Brief Narrative (HPI from H&P)    89 year old female, skilled nursing facility resident, with past medical history significant for dementia, progressive aphasia, dysphagia, neurogenic bladder, UTI, hypertension, and aortic aneurysm.  Patient was recently admitted from January 17 through November 13, 2023 with sepsis that is presumed to be secondary to UTI.  Patient has been admitted with aspiration pneumonia, acute combined hypoxic and hypercapnic respiratory failure (currently on BiPAP).  Pulmonary input is appreciated.  EEG has not revealed any seizures.  She has continued to be unresponsive on IV antibiotics, BiPAP with gradually worsening pneumonia.  Prognosis is poor.     Subjective:   Patient in bed appears to be in respiratory distress, initially on BiPAP at 7:30 AM, subsequently transition to heated high flow nasal cannula at around 9:15 AM, unable to answer questions reliably continuously says " help me"   Assessment  & Plan :   Acute combined hypoxic/hypercapnic respiratory failure/aspiration pneumonia in a patient with advanced dementia recent admission for UTI and poor functional status at baseline:  She is on IV antibiotics and BiPAP since admission on 11/20/2023, now minimally responsive, still running intermittent low-grade fever suggesting ongoing microaspiration of oral secretions somewhat worsened by being on BiPAP.    Discussed with  critical care in detail on 11/23/2023, 11/24/2023, nurse practitioner Randon Goldsmith and Dr. Levon Hedger.  Discussed with patient's daughter who has been bedside in detail on 11/23/2023 and 11/24/2023, also with patient's son on 11/23/2023 and 11/24/2023.    Had discussion with patient's son on 11/23/2023, he wanted to continue BiPAP, wanted to hold off full comfort measures still to 12/12/2023 and give her mother full chance to recover, explained to him clearly that patient is getting worse faster due to BiPAP and microaspiration, he  requested IV fluids to be added, explained to him that due to lack of nutrition and albumin most with IV fluids will end up in the lungs and make her even more uncomfortable.  He remained extremely dismissive of my suggestions suggestions.  On 11/24/2023 patient significantly worse, now visibly moaning for help, discussed with patient's daughter bedside around 7:30 AM and she wanted me to initiate comfort measures as she thought her mother was suffering, she also wanted me to talk to her brother.  I called patient's son around 46 AM as well, I told him that his mother was suffering and not doing well, told me that for now he wants to continue heated high flow nasal cannula and if she declines to replace her on BiPAP, use low-dose as needed morphine only if she is too uncomfortable.  This was discussed with the hospitalist office on 5 W. over a speaker phone and my partner Dr.S. Jerral Ralph was witness to this conversation.  Again he was extremely condescending and dismissive.  Around 9:30 AM I was approached by the critical care nurse practitioner Ms. Randon Goldsmith that she has told the family that she will not be offering BiPAP anymore and that if patient gets worse she should be transition to high-dose morphine and transition to full comfort measures, this was different than what I had discussed about half an hour ago with the son so I told Ms. daily that I will call him again so that we are  all on the same page.  I called the son around 9:30 AM, to confirm that the plan now is to try heated high flow oxygen along with morphine, if there is decline then not to go back on BiPAP and to transition to full comfort measures, he got extremely irritated and raised his voice saying " I think you are driving an agenda to kill my mom", I told her it was not so and that I was confirming this plan as I was told by critical care about it few minutes ago and that they were sitting next to me, he subsequently said stick to the plan that I gave you and hung up the phone, witnessed by Dr Jerral Ralph and NP Florentina Addison.  I was then told by the staff that patient is increasingly more short of breath, went to see the patient and daughter was requesting me to make her comfortable and give her more morphine, PCCM nurse practitioner Ms. Randon Goldsmith was with me in the room.  I told the daughter that I completely agree that patient needs to be getting more morphine and transition to comfort measures however I was little bit conflicted as patient's son who is the power of attorney does not want to accelerate comfort measures at this time and is accusing me of trying to kill the patient.   Patient's daughter then told us that she was very apologetic for her brothers rude behavior, she further said that she is a pediatrician and understands that her mother is suffering and we should be doing everything to transition to comfort care," my brother thinks that since he is a Careers adviser and I am a pediatrician that he knows more than me, he is not coming to terms with mom's condition" she also adds" he only listens to Dr. Myrla Halsted and does not except the suggestions given by other hospitalist physicians".  The other hospital physicians are Dr.Ogbata initially and then me.  I then requested her to please check with the brother if  it is appropriate to go up on morphine and to transition to comfort measures if her mother declines, she  called her brother and placed her on cell phone, he briefly tried to talk to his mother and then told his sister that " it is okay to give her morphine, by the way I just talked to the hospitalist over the phone who is a asshole" I politely informed him that I was standing next to him and that his language was not appreciated.  He subsequently hung up the phone.  Patient's daughter then apologized multiple times for her brother's behavior and reiterated that we should do everything to keep her mother comfortable and that she understands that her mother is dying and unfortunately has to suffer during her last days because of her brother not coming to terms with the real situation.  Me and PCCM nurse practitioner Ms. Katie I showed her that we will do everything possible to help her mother in this situation.   At this time patient's prognosis is extremely poor, she is currently on heated high flow nasal cannula oxygen with as needed morphine for comfort, gentle IV fluids per family's request have been continued, she is DNR.  If there is further decline will discuss with family again about morphine drip, also palliative care consult has been called and await their input.     Acute metabolic and toxic encephalopathy:  Head CT and EEG unremarkable, no focal deficits, has underlying advanced Alzheimer's dementia with underlying expressive aphasia.  Poor functional status to begin with.  Supportive care.   History Alzheimer's dementia, aphasia: Not on medication, as above.  Dysphagia: Currently n.p.o. due to decreased mental status, if improved then SLP.  Neurogenic bladder, recurrent UTI: Takes Macrobid for suppressive therapy, restart when able to take p.o.  Hypertension: As needed IV hydralazine, nitroglycerin paste and monitor  History peptic ulcer disease: Place on IV PPI  History aortic aneurysm: Outpatient monitoring.  Hypernatremia and dehydration.  Discussed with family they want IV fluids,  explained to them the physiology of third spacing and possible worsening respiratory status, son and daughter are physicians they understand but want to give her a chance with IV fluids.        Condition - Extremely Guarded  Family Communication  :  daughter bedside, son Blakelynn Scheeler (son): [phone]   Code Status :  DNR  Consults  :   PCCM, Pall care   PUD Prophylaxis :     Procedures  :            Disposition Plan  :    Status is: Inpatient   DVT Prophylaxis  :    Place TED hose Start: 11/22/23 0981 Place and maintain sequential compression device Start: 11/22/23 0706 enoxaparin (LOVENOX) injection 40 mg Start: 11/21/23 1400    Lab Results  Component Value Date   PLT 192 11/24/2023    Diet :  Diet Order             Diet NPO time specified  Diet effective now                    Inpatient Medications  Scheduled Meds:  albuterol  2.5 mg Nebulization Q6H   bisacodyl  10 mg Rectal BID   enoxaparin (LOVENOX) injection  40 mg Subcutaneous Q24H   insulin aspart  0-15 Units Subcutaneous Q4H   methylPREDNISolone (SOLU-MEDROL) injection  60 mg Intravenous Q12H   nitroGLYCERIN  1 inch Topical Q6H  pantoprazole (PROTONIX) IV  40 mg Intravenous Daily   sodium chloride flush  3 mL Intravenous Q12H   Continuous Infusions:  piperacillin-tazobactam (ZOSYN)  IV 3.375 g (11/24/23 0521)   potassium chloride     thiamine (VITAMIN B1) injection 500 mg (11/24/23 0522)   PRN Meds:.acetaminophen, bisacodyl, hydrALAZINE, morphine injection    Objective:   Vitals:   11/24/23 0253 11/24/23 0301 11/24/23 0322 11/24/23 0919  BP:  134/70 (!) 141/66   Pulse:  (!) 58 72   Resp:  16 16   Temp:   98.6 F (37 C)   TempSrc:   Oral   SpO2: 97% 97% 96% 96%    Wt Readings from Last 3 Encounters:  11/19/23 74.5 kg  11/17/23 74.5 kg  11/08/23 74.3 kg     Intake/Output Summary (Last 24 hours) at 11/24/2023 1050 Last data filed at 11/24/2023 0600 Gross per 24 hour   Intake 1153.05 ml  Output 1800 ml  Net -646.95 ml     Physical Exam  Patient in bed she is awake, sobbing, saying nonstop please help me, daughter bedside, she appears to be in respiratory distress [place].AT,PERRAL Supple Neck, No JVD,   Symmetrical Chest wall movement, coarse bilateral breath sounds RRR,No Gallops,Rubs or new Murmurs,  +ve B.Sounds, Abd Soft, No tenderness,   No Cyanosis, Clubbing or edema       Data Review:    Recent Labs  Lab 11/20/23 1510 11/20/23 1518 11/21/23 0406 11/21/23 0421 11/22/23 0319 11/23/23 0836 11/24/23 0446  WBC 14.3*  --  10.2  --  11.6* 11.7* 12.0*  HGB 13.2   < > 13.3 13.9 11.8* 11.5* 12.1  HCT 43.0   < > 43.6 41.0 39.0 37.5 39.0  PLT 206  --  157  --  167 180 192  MCV 91.3  --  92.2  --  92.0 90.4 90.9  MCH 28.0  --  28.1  --  27.8 27.7 28.2  MCHC 30.7  --  30.5  --  30.3 30.7 31.0  RDW 13.7  --  14.0  --  14.5 14.1 14.1  LYMPHSABS 1.2  --   --   --   --   --   --   MONOABS 0.8  --   --   --   --   --   --   EOSABS 0.2  --   --   --   --   --   --   BASOSABS 0.1  --   --   --   --   --   --    < > = values in this interval not displayed.    Recent Labs  Lab 11/20/23 1510 11/20/23 1518 11/20/23 1700 11/20/23 1749 11/20/23 1804 11/20/23 1834 11/20/23 2244 11/20/23 2323 11/21/23 0406 11/21/23 0421 11/22/23 0319 11/23/23 0836 11/23/23 0854 11/24/23 0446  NA 142 142 142  --   --    < >  --    < > 142 143 146* 148*  --  145  K 4.5 4.4 4.4  --   --    < >  --    < > 4.1 4.1 3.5 3.1*  --  3.3*  CL 105  --  107  --   --   --   --   --  104  --  107 105  --  106  CO2 26  --   --   --   --   --   --   --  28  --  27 32  --  30  ANIONGAP 11  --   --   --   --   --   --   --  10  --  12 11  --  9  GLUCOSE 163*  --  161*  --   --   --   --   --  217*  --  200* 176*  --  201*  BUN 20  --  26*  --   --   --   --   --  18  --  27* 22  --  24*  CREATININE 0.76  --  0.80  --   --   --   --   --  0.74  --  0.89 0.75  --  0.72  AST  37  --   --   --   --   --   --   --   --   --   --   --   --   --   ALT 27  --   --   --   --   --   --   --   --   --   --   --   --   --   ALKPHOS 78  --   --   --   --   --   --   --   --   --   --   --   --   --   BILITOT 0.6  --   --   --   --   --   --   --   --   --   --   --   --   --   ALBUMIN 3.2*  --   --   --   --   --   --   --   --   --   --   --   --   --   CRP  --   --   --   --   --   --   --   --   --   --   --  17.3*  --   --   DDIMER  --   --   --  0.67*  --   --   --   --   --   --   --   --   --   --   PROCALCITON  --   --   --   --   --   --   --   --   --   --  18.27 8.96  --   --   LATICACIDVEN  --  1.5  --   --  2.2*  --  2.9*  --  2.4*  --   --   --   --   --   INR 1.0  --   --   --   --   --   --   --   --   --   --   --   --   --   TSH  --   --   --   --   --   --   --   --  0.443  --   --   --   --   --   HGBA1C  --   --   --   --   --   --   --   --   --   --   --   --  6.5*  --   AMMONIA  --   --   --   --   --   --   --   --   --   --  29 40*  --   --   BNP 56.8  --   --   --   --   --   --   --  81.6  --   --  302.0*  --   --   MG  --   --   --   --   --   --   --   --  1.9  --  2.8* 2.5*  --  2.4  PHOS  --   --   --   --   --   --   --   --  3.1  --   --   --   --   --   CALCIUM 8.8*  --   --   --   --   --   --   --  8.8*  --  9.1 9.2  --  9.2   < > = values in this interval not displayed.      Recent Labs  Lab 11/20/23 1510 11/20/23 1518 11/20/23 1749 11/20/23 1804 11/20/23 2244 11/21/23 0406 11/22/23 0319 11/23/23 0836 11/23/23 0854 11/24/23 0446  CRP  --   --   --   --   --   --   --  17.3*  --   --   DDIMER  --   --  0.67*  --   --   --   --   --   --   --   PROCALCITON  --   --   --   --   --   --  18.27 8.96  --   --   LATICACIDVEN  --  1.5  --  2.2* 2.9* 2.4*  --   --   --   --   INR 1.0  --   --   --   --   --   --   --   --   --   TSH  --   --   --   --   --  0.443  --   --   --   --   HGBA1C  --   --   --   --   --   --   --   --   6.5*  --   AMMONIA  --   --   --   --   --   --  29 40*  --   --   BNP 56.8  --   --   --   --  81.6  --  302.0*  --   --   MG  --   --   --   --   --  1.9 2.8* 2.5*  --  2.4  CALCIUM 8.8*  --   --   --   --  8.8* 9.1 9.2  --  9.2   --------------------------------------------------------------------------------------------------------------- Lab Results  Component Value Date   CHOL 235 (A) 07/20/2020   HDL 100 (A) 07/20/2020   LDLCALC 134 07/20/2020   TRIG 102 07/20/2020    Lab Results  Component Value Date   HGBA1C 6.5 (H) 11/23/2023   No results for input(s): "TSH", "T4TOTAL", "FREET4", "T3FREE", "THYROIDAB" in the last 72 hours.  Micro Results Recent Results (from the past 240 hours)  Resp panel by RT-PCR (RSV, Flu A&B, Covid) Anterior Nasal Swab     Status: None   Collection Time: 11/20/23  2:42 PM   Specimen: Anterior Nasal Swab  Result Value Ref Range Status   SARS Coronavirus 2 by RT PCR NEGATIVE NEGATIVE Final   Influenza A by PCR NEGATIVE NEGATIVE Final   Influenza B by PCR NEGATIVE NEGATIVE Final    Comment: (NOTE) The Xpert Xpress SARS-CoV-2/FLU/RSV plus assay is intended as an aid in the diagnosis of influenza from Nasopharyngeal swab specimens and should not be used as a sole basis for treatment. Nasal washings and aspirates are unacceptable for Xpert Xpress SARS-CoV-2/FLU/RSV testing.  Fact Sheet for Patients: BloggerCourse.com  Fact Sheet for Healthcare Providers: SeriousBroker.it  This test is not yet approved or cleared by the [place] FDA and has been authorized for detection and/or diagnosis of SARS-CoV-2 by FDA under an Emergency Use Authorization (EUA). This EUA will remain in effect (meaning this test can be used) for the duration of the COVID-19 declaration under Section 564(b)(1) of the Act, 21 U.S.C. section 360bbb-3(b)(1), unless the authorization is terminated or revoked.     Resp  Syncytial Virus by PCR NEGATIVE NEGATIVE Final    Comment: (NOTE) Fact Sheet for Patients: BloggerCourse.com  Fact Sheet for Healthcare Providers: SeriousBroker.it  This test is not yet approved or cleared by the [place] FDA and has been authorized for detection and/or diagnosis of SARS-CoV-2 by FDA under an Emergency Use Authorization (EUA). This EUA will remain in effect (meaning this test can be used) for the duration of the COVID-19 declaration under Section 564(b)(1) of the Act, 21 U.S.C. section 360bbb-3(b)(1), unless the authorization is terminated or revoked.  Performed at [facility], [address] [place], [place] [address]   Respiratory (~20 pathogens) panel by PCR     Status: None   Collection Time: 11/20/23  2:42 PM   Specimen: Nasopharyngeal Swab; Respiratory  Result Value Ref Range Status   Adenovirus NOT DETECTED NOT DETECTED Final   Coronavirus 229E NOT DETECTED NOT DETECTED Final    Comment: (NOTE) The Coronavirus on the Respiratory Panel, DOES NOT test for the novel  Coronavirus (2019 nCoV)    Coronavirus HKU1 NOT DETECTED NOT DETECTED Final   Coronavirus NL63 NOT DETECTED NOT DETECTED Final   Coronavirus OC43 NOT DETECTED NOT DETECTED Final   Metapneumovirus NOT DETECTED NOT DETECTED Final   Rhinovirus / Enterovirus NOT DETECTED NOT DETECTED Final   Influenza A NOT DETECTED NOT DETECTED Final   Influenza B NOT DETECTED NOT DETECTED Final   Parainfluenza Virus 1 NOT DETECTED NOT DETECTED Final   Parainfluenza Virus 2 NOT DETECTED NOT DETECTED Final   Parainfluenza Virus 3 NOT DETECTED NOT DETECTED Final   Parainfluenza Virus 4 NOT DETECTED NOT DETECTED Final   Respiratory Syncytial Virus NOT DETECTED NOT DETECTED Final   Bordetella pertussis NOT DETECTED NOT DETECTED Final   Bordetella Parapertussis NOT DETECTED NOT DETECTED Final   Chlamydophila pneumoniae NOT DETECTED NOT DETECTED Final    Mycoplasma pneumoniae NOT DETECTED NOT DETECTED Final    Comment: Performed at [facility], [address], [place], [place] [address]  Blood Culture (routine x 2)     Status: None (Preliminary result)   Collection Time: 11/20/23  3:38 PM   Specimen: BLOOD  Result Value Ref Range Status   Specimen Description BLOOD RIGHT ANTECUBITAL  Final   Special  Requests   Final    BOTTLES DRAWN AEROBIC AND ANAEROBIC Blood Culture results may not be optimal due to an inadequate volume of blood received in culture bottles   Culture   Final    NO GROWTH 4 DAYS Performed at [facility], [address] [place], [place] [address]    Report Status PENDING  Incomplete  Blood Culture (routine x 2)     Status: None (Preliminary result)   Collection Time: 11/20/23  3:50 PM   Specimen: BLOOD  Result Value Ref Range Status   Specimen Description BLOOD LEFT ANTECUBITAL  Final   Special Requests   Final    BOTTLES DRAWN AEROBIC AND ANAEROBIC Blood Culture results may not be optimal due to an inadequate volume of blood received in culture bottles   Culture   Final    NO GROWTH 4 DAYS Performed at [facility], [address], [place], [place] [address]    Report Status PENDING  Incomplete  MRSA Next Gen by PCR, Nasal     Status: None   Collection Time: 11/21/23  5:40 AM   Specimen: Nasal Mucosa; Nasal Swab  Result Value Ref Range Status   MRSA by PCR Next Gen NOT DETECTED NOT DETECTED Final    Comment: (NOTE) The GeneXpert MRSA Assay (FDA approved for NASAL specimens only), is one component of a comprehensive MRSA colonization surveillance program. It is not intended to diagnose MRSA infection nor to guide or monitor treatment for MRSA infections. Test performance is not FDA approved in patients less than 43 years old. Performed at [facility], [address], [place], [place] [address]     Radiology Report DG Abd Portable 1V Result Date: 11/23/2023 CLINICAL DATA:   89 year old female with history of constipation. EXAM: PORTABLE ABDOMEN - 1 VIEW COMPARISON:  Abdominal radiograph 11/21/2023. FINDINGS: Gas and stool are seen scattered throughout the colon extending to the level of the distal rectum. No pathologic distension of small bowel is noted. Stool burden does not appear excessive. No gross evidence of pneumoperitoneum. IMPRESSION: 1. Nonobstructive bowel gas pattern. 2. Stool burden does not appear excessive. Electronically Signed   By: Trudie Reed M.D.   On: 11/23/2023 07:50   DG Chest Port 1 View Result Date: 11/23/2023 CLINICAL DATA:  89 year old female with history of shortness of breath. EXAM: PORTABLE CHEST 1 VIEW COMPARISON:  Chest x-ray 11/22/2023. FINDINGS: Lung volumes are normal. Diffuse interstitial prominence, widespread peribronchial cuffing and patchy ill-defined opacities are noted throughout the lungs bilaterally, most evident in the left upper lobe and right mid to lower lung, concerning for bronchitis with multilobar bilateral bronchopneumonia. No pleural effusions. No pneumothorax. No evidence of pulmonary edema. Heart size is normal. Upper mediastinal contours are within normal limits. IMPRESSION: 1. The appearance of the chest is concerning for bronchitis with multilobar bilateral bronchopneumonia, as above. Electronically Signed   By: Trudie Reed M.D.   On: 11/23/2023 07:49   CT HEAD WO CONTRAST ( ) Result Date: 11/22/2023 CLINICAL DATA:  Mental status change, unknown cause. EXAM: CT HEAD WITHOUT CONTRAST TECHNIQUE: Contiguous axial images were obtained from the base of the skull through the vertex without intravenous contrast. RADIATION DOSE REDUCTION: This exam was performed according to the departmental dose-optimization program which includes automated exposure control, adjustment of the mA and/or kV according to patient size and/or use of iterative reconstruction technique. COMPARISON:  Head CT 05/31/2023 FINDINGS: Brain: There  is no evidence of an acute infarct, intracranial  hemorrhage, mass, midline shift, or extra-axial fluid collection. Patchy hypodensities in the cerebral white matter are unchanged and nonspecific but compatible with moderate chronic small vessel ischemic disease. There is mild cerebral atrophy. Vascular: Calcified atherosclerosis at the skull base. No hyperdense vessel. Skull: No acute fracture or suspicious osseous lesion. Sinuses/Orbits: Visualized paranasal sinuses and mastoid air cells are clear. Bilateral cataract extraction. Other: None. IMPRESSION: 1. No evidence of acute intracranial abnormality. 2. Moderate chronic small vessel ischemic disease. Electronically Signed   By: Sebastian Ache M.D.   On: 11/22/2023 16:56   EEG adult Result Date: 11/22/2023 Charlsie Quest, MD     11/22/2023 10:03 AM Patient Name: CHIRSTY ARMISTEAD MRN: [id number] Epilepsy Attending: Charlsie Quest Referring Physician/Provider: Lorin Glass, MD Date: 11/22/2023 Duration: 24.09 mins Patient history: 89yo F with ams. EEG to evaluate for seizure Level of alertness: lethargic /sleep AEDs during EEG study: None Technical aspects: This EEG study was done with scalp electrodes positioned according to the 10-20 International system of electrode placement. Electrical activity was reviewed with band pass filter of 1-70Hz , sensitivity of 7 uV/mm, display speed of 5mm/sec with a 60Hz  notched filter applied as appropriate. EEG data were recorded continuously and digitally stored.  Video monitoring was available and reviewed as appropriate. Description: EEG showed continuous generalized 3 to 6 Hz theta-delta slowing. Hyperventilation and photic stimulation were not performed.   ABNORMALITY - Continuous slow, generalized IMPRESSION: This study is suggestive of moderate diffuse encephalopathy. No seizures or epileptiform discharges were seen throughout the recording. Charlsie Quest   DG Chest Port 1 View Result Date: 11/22/2023 CLINICAL DATA:   89 year old female with history of respiratory distress concerning for pneumonia. EXAM: PORTABLE CHEST 1 VIEW COMPARISON:  Chest x-ray 11/20/2023. FINDINGS: Lung volumes are low. Widespread but patchy areas of interstitial prominence, peribronchial cuffing and patchy ill-defined opacities are noted throughout the lungs bilaterally, most confluent in the left upper lobe and right lung base. No definite pleural effusions. No pneumothorax. No evidence of pulmonary edema. Heart size is upper limits of normal. The patient is rotated to the right on today's exam, resulting in distortion of the mediastinal contours and reduced diagnostic sensitivity and specificity for mediastinal pathology. Atherosclerotic calcifications in the thoracic aorta. IMPRESSION: 1. The appearance of the chest is concerning for multilobar bilateral pneumonia, as above. 2. Aortic atherosclerosis. Electronically Signed   By: Trudie Reed M.D.   On: 11/22/2023 07:29   DG Abd 1 View Result Date: 11/21/2023 CLINICAL DATA:  Recent aspiration EXAM: ABDOMEN - 1 VIEW COMPARISON:  None Available. FINDINGS: Scattered large and small bowel gas is noted. No free air is seen. No abnormal mass or abnormal calcifications are noted. Contrast is noted within the bladder from recent CT. IMPRESSION: No acute abnormality noted. Electronically Signed   By: Alcide Clever M.D.   On: 11/21/2023 02:49   CT Angio Chest PE W and/or Wo Contrast Result Date: 11/20/2023 CLINICAL DATA:  Pulmonary embolism (PE) suspected, high prob. Leukocytosis EXAM: CT ANGIOGRAPHY CHEST WITH CONTRAST TECHNIQUE: Multidetector CT imaging of the chest was performed using the standard protocol during bolus administration of intravenous contrast. Multiplanar CT image reconstructions and MIPs were obtained to evaluate the vascular anatomy. RADIATION DOSE REDUCTION: This exam was performed according to the departmental dose-optimization program which includes automated exposure control,  adjustment of the mA and/or kV according to patient size and/or use of iterative reconstruction technique. CONTRAST:  65mL OMNIPAQUE IOHEXOL 350 MG/ML SOLN COMPARISON:  02/04/2023 FINDINGS: Cardiovascular:  Satisfactory opacification of the pulmonary arteries to the segmental level. No evidence of pulmonary embolism. Mid ascending thoracic aorta measures 4.0 cm in diameter, stable. Scattered atherosclerotic vascular calcifications of the aorta and coronary arteries. Normal heart size. No pericardial effusion. Mediastinum/Nodes: No axillary, mediastinal, or hilar lymphadenopathy 4.7 cm fluid attenuation nodule within the left thyroid lobe, unchanged. This results in rightward tracheal deviation. Esophagus within normal limits. Lungs/Pleura: Extensive patchy bilateral airspace consolidations, most pronounced within the perihilar regions. No pleural effusion or pneumothorax. Upper Abdomen: No acute abnormality. Musculoskeletal: No chest wall abnormality. No acute or significant osseous findings. Review of the MIP images confirms the above findings. IMPRESSION: 1. No evidence of pulmonary embolism. 2. Extensive patchy bilateral airspace consolidations, most pronounced within the perihilar regions. Findings are most compatible with multifocal pneumonia. Radiographic follow-up to resolution is recommended. 3. Stable 4.7 cm left thyroid nodule. In the setting of significant comorbidities or limited life expectancy, no follow-up recommended (ref: J Am Coll Radiol. 2015 Feb;12(2): 143-50). 4. Aortic and coronary artery atherosclerosis (ICD10-I70.0). Electronically Signed   By: Duanne Guess D.O.   On: 11/20/2023 21:53   DG Chest Port 1 View Result Date: 11/20/2023 CLINICAL DATA:  Concern for sepsis.  Vomiting. EXAM: PORTABLE CHEST 1 VIEW COMPARISON:  Chest radiograph dated June 24, 2023. FINDINGS: The heart size and mediastinal contours are within normal limits. Aortic atherosclerosis. Streaky opacities in the  bilateral lower lungs, more pronounced on the right. No pleural effusion or pneumothorax. No acute osseous abnormality. IMPRESSION: Streaky opacities in the bilateral lower lungs, more pronounced on the right, could reflect atelectasis or infiltrate. Electronically Signed   By: Hart Robinsons M.D.   On: 11/20/2023 15:46   CT ABDOMEN PELVIS W CONTRAST Result Date: 11/08/2023 CLINICAL DATA:  Bowel obstruction EXAM: CT ABDOMEN AND PELVIS WITH CONTRAST TECHNIQUE: Multidetector CT imaging of the abdomen and pelvis was performed using the standard protocol following bolus administration of intravenous contrast. RADIATION DOSE REDUCTION: This exam was performed according to the departmental dose-optimization program which includes automated exposure control, adjustment of the mA and/or kV according to patient size and/or use of iterative reconstruction technique. CONTRAST:  75mL OMNIPAQUE IOHEXOL 350 MG/ML SOLN COMPARISON:  None Available. FINDINGS: Lower chest: Extensive multi-vessel coronary artery calcification. Global cardiac size within normal limits. There is dilation of the ascending thoracic aorta which measures at least 4.0 cm in diameter and is incompletely visualized on this examination. This is similar, however, to prior examination 02/04/2023. Bibasilar dependent atelectasis. Hepatobiliary: Evaluation of the upper abdominal solid organs is limited by respiratory motion artifact. Allowing for this, the liver and gallbladder are unremarkable. No intra or extrahepatic biliary ductal dilation. Pancreas: Unremarkable Spleen: Unremarkable Adrenals/Urinary Tract: The adrenal glands are unremarkable. Mild asymmetric right renal cortical atrophy. The kidneys are normal in position. Mild asymmetric dilation of the right renal pelvis with decompression of the right ureter suggests a mild UPJ obstruction. No intrarenal or ureteral calculi. No hydronephrosis on the left. The bladder is unremarkable. Stomach/Bowel:  Stomach is within normal limits. Appendix appears normal. No evidence of bowel wall thickening, distention, or inflammatory changes. Vascular/Lymphatic: There is dilation of the left gonadal vein with bilateral adnexal varices identified suggesting changes of ovarian vein reflux and pelvic venous insufficiency. Moderate atherosclerotic calcification within the abdominal aorta. No aortic aneurysm. No pathologic adenopathy within the abdomen and pelvis. Reproductive: Pessary in place. Pelvic organs are otherwise unremarkable. Other: Diastasis of the rectus abdominus musculature noted. No abdominopelvic ascites Musculoskeletal: The osseous structures are  age-appropriate. No acute bone abnormality IMPRESSION: 1. No acute intra-abdominal pathology identified. No definite radiographic explanation for the patient's reported symptoms. 2. Extensive multi-vessel coronary artery calcification. 3. Dilation of the ascending thoracic aorta which measures at least 4.0 cm in diameter and is incompletely visualized on this examination. This is similar, however, to prior examination 02/04/2023. Follow-up recommendations are as outlined on prior examination. 4. 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 5. Dilation of the left gonadal vein with bilateral adnexal varices identified suggesting changes of ovarian vein reflux and pelvic venous insufficiency. Aortic Atherosclerosis (ICD10-I70.0). Electronically Signed   By: Helyn Numbers M.D.   On: 11/08/2023 01:21     Signature  -   Susa Raring M.D on 11/24/2023 at 10:50 AM   -  To page go to [email or web address]

## 2023-11-24 NOTE — TOC Initial Note (Signed)
Transition of Care [facility]) - Initial/Assessment Note    Patient Details  Name: Cheryl Monroe MRN: [id number] Date of Birth: 1935/02/01  Transition of Care [facility] CM/SW Contact:    Mearl Latin, LCSW Phone Number: 11/24/2023, 1:59 PM  Clinical Narrative:                 Patient admitted from [facility]. CSW following for needs.     Barriers to Discharge: Continued Medical Work up   Patient Goals and CMS Choice            Expected Discharge Plan and Services In-house Referral: Clinical Social Work     Living arrangements for the past 2 months: Skilled Nursing Facility                                      Prior Living Arrangements/Services Living arrangements for the past 2 months: Skilled Nursing Facility Lives with:: Facility Resident Patient language and need for interpreter reviewed:: Yes Do you feel safe going back to the place where you live?: Yes      Need for Family Participation in Patient Care: Yes (Comment) Care giver support system in place?: Yes (comment)   Criminal Activity/Legal Involvement Pertinent to Current Situation/Hospitalization: No - Comment as needed  Activities of Daily Living      Permission Sought/Granted Permission sought to share information with : Facility Medical sales representative, Family Supports Permission granted to share information with : No  Share Information with NAME: [name]/Prachi  Permission granted to share info w AGENCY: [facility]  Permission granted to share info w Relationship: Son/Daughter     Emotional Assessment Appearance:: Appears stated age Attitude/Demeanor/Rapport: Unable to Assess Affect (typically observed): Unable to Assess Orientation: :  (unable to follow commands) Alcohol / Substance Use: Not Applicable Psych Involvement: No (comment)  Admission diagnosis:  Aspiration into airway [T17.908A] Multifocal pneumonia [J18.9] Acute hypoxic respiratory failure (HCC) [J96.01] Encephalopathy,  unspecified type [G93.40] Patient Active Problem List   Diagnosis Date Noted   Encephalopathy 11/22/2023   Acute hypoxic respiratory failure (HCC) 11/22/2023   Aspiration into airway 11/21/2023   Aspiration pneumonia (HCC) 11/21/2023   Vomiting 11/21/2023   UTI (urinary tract infection) 11/08/2023   Sepsis secondary to UTI (HCC) 11/08/2023   Bladder retention 11/08/2023   AAA (abdominal aortic aneurysm) (HCC) 11/08/2023   Hypoalbuminemia 11/08/2023   GERD (gastroesophageal reflux disease) 11/08/2023   Acute UTI 05/31/2023   Multifocal pneumonia 02/05/2023   Aphasia 02/05/2023   Neurogenic bladder 04/18/2022   Acute metabolic encephalopathy 08/02/2021   Word finding difficulty 02/22/2021   Alzheimer's dementia without behavioral disturbance (HCC) 09/10/2020   History of COVID-19 07/26/2020   Orthostatic dizziness 07/19/2020   Balance problem 07/19/2020   Constipation 07/19/2020   Clitoral irritation 07/19/2020   Asymptomatic bacteriuria 07/19/2020   Urinary frequency 07/19/2020   Urge incontinence 07/19/2020   Weakness of left lower extremity 07/19/2020   Essential hypertension 07/19/2020   Weakness 07/19/2020   Impaired mobility 07/19/2020   Recurrent UTI 07/19/2020   Dementia without behavioral disturbance (HCC) 07/19/2020   Edema 07/19/2020   Spinal stenosis 07/19/2020   PCP:  Mahlon Gammon, MD Pharmacy:   [facility] - [place], [place] - [address] [place] [address] Phone: [phone] Fax: [phone]     Social Drivers of Health (SDOH) Social History: SDOH Screenings   Food Insecurity: Patient Unable  To Answer (11/22/2023)  Housing: Patient Unable To Answer (11/22/2023)  Transportation Needs: Patient Unable To Answer (11/22/2023)  Utilities: Patient Unable To Answer (11/22/2023)  Depression (PHQ2-9): Low Risk  (12/05/2022)  Social Connections: Patient Unable To Answer (11/22/2023)  Tobacco Use: Low Risk   (11/19/2023)   SDOH Interventions:     Readmission Risk Interventions    02/11/2023    4:11 PM  Readmission Risk Prevention Plan  Post Dischage Appt Complete  Medication Screening Complete  Transportation Screening Complete

## 2023-11-24 NOTE — Progress Notes (Signed)
PT Cancellation Note  Patient Details Name: LYNE KHURANA MRN: [id number] DOB: 1935/03/01   Cancelled Treatment:    Reason Eval/Treat Not Completed: PT screened, no needs identified, will sign off (Per chart review, it appears that pt was admitted from SNF and uses a hoyer lift at baseline. No acute PT needs identified. Will sign off.)   Gladys Damme 11/24/2023, 8:20 AM

## 2023-11-24 NOTE — Plan of Care (Signed)
  Problem: Elimination: Goal: Will not experience complications related to bowel motility Outcome: Progressing   Problem: Education: Goal: Knowledge of General Education information will improve Description: Including pain rating scale, medication(s)/side effects and non-pharmacologic comfort measures Outcome: Not Progressing   Problem: Health Behavior/Discharge Planning: Goal: Ability to manage health-related needs will improve Outcome: Not Progressing   Problem: Clinical Measurements: Goal: Respiratory complications will improve Outcome: Not Progressing Goal: Cardiovascular complication will be avoided Outcome: Not Progressing   Problem: Activity: Goal: Risk for activity intolerance will decrease Outcome: Not Progressing   Problem: Nutrition: Goal: Adequate nutrition will be maintained Outcome: Not Progressing   Problem: Skin Integrity: Goal: Risk for impaired skin integrity will decrease Outcome: Not Progressing

## 2023-11-24 NOTE — Progress Notes (Signed)
NAME:  JEANNY RYMER, MRN:  [id number], DOB:  21-Sep-1935, LOS: 3 ADMISSION DATE:  11/20/2023, CONSULTATION DATE:  11/20/2023 REFERRING MD: Dr. Alvira Monday, CHIEF COMPLAINT: Gross aspiration  History of Present Illness:  This 89 year old Asian female seen in consultation at the request of Dr. Lafe Garin for recommendations on further evaluation and management of respiratory distress.  Specifically, she is called requesting recommendations on endotracheal intubation.  The patient is a resident from [facility].  She was apparently seen at [facility] for an acute outpatient visit yesterday (1 day prior to presentation) for a gross aspiration event after an episode of emesis.  She was observed to have room air SpO2 84-90%.  She was subsequently placed on supplemental oxygen at 2 LPM via nasal cannula with prompt improvement in SpO2 to 99%.  She was transferred to [facility] emergency department today due to concerns for possible aspiration pneumonia.  In the emergency department, the patient was noted to be hypoxic with increasing oxygen requirements.  She has been placed on BiPAP and is breathing comfortably.  She is hemodynamically stable.  Chest x-ray shows bilateral streaky opacities in the lower lungs, consistent with recent aspiration event.     Pertinent  Medical History  PAST MEDICAL HISTORY significant for Alzheimer's disease, balance problems, constipation, spinal stenosis, osteoarthritis, pyelonephritis.  She was recently hospitalized (1/17 - 1/23) for sepsis secondary to urinary tract infection.  She was treated with Rocephin/fluconazole IV.  Significant Hospital Events: Including procedures, antibiotic start and stop dates in addition to other pertinent events   1/30: Presented to ER 1 day after gross aspiration event at skilled nursing facility.  Placed on BiPAP for respiratory support. 2/1 remains BiPAP dependent 2/2 again remains BiPAP  dependent with persistent hypercapnia.  Mentation remains poor but daughter did report that she was able to awake for FaceTime with family yesterday afternoon  Interim History / Subjective:  Pt seen at bedside, discussed with daughter in room and son via phone. Was on HHFNC most of yesterday, placed back on bipap overnight. Pt very uncomfortable on bipap, tearful, moaning. No acute respiratory distress. Per daughter had 0.5mg  morphine ~1hr ago.   Objective   Blood pressure (!) 141/66, pulse 72, temperature 98.6 F (37 C), temperature source Oral, resp. rate 16, SpO2 96%.    FiO2 (%):  [40 %-41 %] 41 %   Intake/Output Summary (Last 24 hours) at 11/24/2023 0931 Last data filed at 11/24/2023 0600 Gross per 24 hour  Intake 1153.05 ml  Output 1800 ml  Net -646.95 ml   There were no vitals filed for this visit.  Examination: General:  deconditioned chronically ill appearing elderly female, uncomfortable on bipap.  HEENT: MM pink/moist Neuro: awake, tearful, confused CV: s1s2 rrr, no m/r/g PULM:  resps even non labored on bipap, transitioned to HFNC while I was in room, coarse throughout  GI: soft, bsx4 active  Extremities: warm/dry, scant BLE edema  Skin: no rashes or lesions   Resolved Hospital Problem list   Not applicable  Assessment & Plan:  Acute hypoxemic and hypercarbic respiratory failure -Secondary to aspiration pneumonia Acute encephalopathy - multifactorial -Hypercarbic, hypoxemic, ?septic Dementia -Primary aphasia predominant Bedbound -Progressive weakness, slowness, ?dizziness culminating in SNF decision to keep in bed w/ hoyer Question of ileus -KUB not that impressive P: No significant improvement with >48hrs bipap. Now very uncomfortable on bipap. Discussed with daughter at length at bedside and son briefly via phone.  They are in  agreement that further bipap is not productive for pt and increasing her discomfort. Continue current abx, steroids, other medical  measures for 24hrs and assess any improvement. However if pt worsens, would not offer further bipap and would transition to full comfort measures. In the meantime would also continue to ensure pts comfort and liberalize morphine for increased WOB as needed.   After my discussion with family, I updated Dr Thedore Mins who called son to ensure all care teams on the same page. Son very rude and aggressive to Dr. Thedore Mins via phone, agrees that he spoke top me and that no further bipap is planned but does not want to discuss further comfort measures or what to do if pt declines. He wants to "give it 24hrs" and see how she does.   Dirk Dress, NP Pulmonary/Critical Care Medicine  11/24/2023  9:31 AM   See Loretha Stapler for personal pager PCCM on call pager [phone] until 7pm. Please call Elink 7p-7a. [phone]

## 2023-11-24 NOTE — Plan of Care (Signed)
  Problem: Education: Goal: Knowledge of General Education information will improve Description: Including pain rating scale, medication(s)/side effects and non-pharmacologic comfort measures Outcome: Progressing   Problem: Health Behavior/Discharge Planning: Goal: Ability to manage health-related needs will improve Outcome: Progressing   Problem: Clinical Measurements: Goal: Ability to maintain clinical measurements within normal limits will improve Outcome: Progressing Goal: Respiratory complications will improve Outcome: Progressing   Problem: Activity: Goal: Risk for activity intolerance will decrease Outcome: Progressing   Problem: Nutrition: Goal: Adequate nutrition will be maintained Outcome: Progressing   Problem: Coping: Goal: Level of anxiety will decrease Outcome: Progressing   Problem: Elimination: Goal: Will not experience complications related to bowel motility Outcome: Progressing Goal: Will not experience complications related to urinary retention Outcome: Progressing

## 2023-11-25 DIAGNOSIS — J9602 Acute respiratory failure with hypercapnia: Secondary | ICD-10-CM | POA: Diagnosis not present

## 2023-11-25 DIAGNOSIS — Z7189 Other specified counseling: Secondary | ICD-10-CM

## 2023-11-25 DIAGNOSIS — J9601 Acute respiratory failure with hypoxia: Secondary | ICD-10-CM | POA: Diagnosis not present

## 2023-11-25 DIAGNOSIS — Z515 Encounter for palliative care: Secondary | ICD-10-CM

## 2023-11-25 DIAGNOSIS — G9340 Encephalopathy, unspecified: Secondary | ICD-10-CM | POA: Diagnosis not present

## 2023-11-25 DIAGNOSIS — J189 Pneumonia, unspecified organism: Secondary | ICD-10-CM | POA: Diagnosis not present

## 2023-11-25 LAB — BASIC METABOLIC PANEL
Anion gap: 11 (ref 5–15)
BUN: 23 mg/dL (ref 8–23)
CO2: 29 mmol/L (ref 22–32)
Calcium: 8.9 mg/dL (ref 8.9–10.3)
Chloride: 105 mmol/L (ref 98–111)
Creatinine, Ser: 0.73 mg/dL (ref 0.44–1.00)
GFR, Estimated: >60 mL/min (ref >60)
Glucose, Bld: 110 mg/dL — ABNORMAL HIGH (ref 70–99)
Potassium: 4.4 mmol/L (ref 3.5–5.1)
Sodium: 145 mmol/L (ref 135–145)

## 2023-11-25 LAB — CBC WITH DIFFERENTIAL/PLATELET
Abs Immature Granulocytes: 0.19 10*3/uL — ABNORMAL HIGH (ref 0.00–0.07)
Basophils Absolute: 0 10*3/uL (ref 0.0–0.1)
Basophils Relative: 0 %
Eosinophils Absolute: 0 10*3/uL (ref 0.0–0.5)
Eosinophils Relative: 0 %
HCT: 38.5 % (ref 36.0–46.0)
Hemoglobin: 11.7 g/dL — ABNORMAL LOW (ref 12.0–15.0)
Immature Granulocytes: 2 %
Lymphocytes Relative: 5 %
Lymphs Abs: 0.6 10*3/uL — ABNORMAL LOW (ref 0.7–4.0)
MCH: 28 pg (ref 26.0–34.0)
MCHC: 30.4 g/dL (ref 30.0–36.0)
MCV: 92.1 fL (ref 80.0–100.0)
Monocytes Absolute: 0.7 10*3/uL (ref 0.1–1.0)
Monocytes Relative: 6 %
Neutro Abs: 10.8 10*3/uL — ABNORMAL HIGH (ref 1.7–7.7)
Neutrophils Relative %: 87 %
Platelets: 180 10*3/uL (ref 150–400)
RBC: 4.18 MIL/uL (ref 3.87–5.11)
RDW: 14.1 % (ref 11.5–15.5)
WBC: 12.4 10*3/uL — ABNORMAL HIGH (ref 4.0–10.5)
nRBC: 0 % (ref 0.0–0.2)

## 2023-11-25 LAB — CULTURE, BLOOD (ROUTINE X 2)
Culture: NO GROWTH
Culture: NO GROWTH

## 2023-11-25 LAB — GLUCOSE, CAPILLARY
Glucose-Capillary: 103 mg/dL — ABNORMAL HIGH (ref 70–99)
Glucose-Capillary: 105 mg/dL — ABNORMAL HIGH (ref 70–99)

## 2023-11-25 LAB — MAGNESIUM: Magnesium: 2.4 mg/dL (ref 1.7–2.4)

## 2023-11-25 MED ORDER — MORPHINE SULFATE (PF) 2 MG/ML IV SOLN: 1.0000 mg | Freq: Once | INTRAVENOUS | Status: DC

## 2023-11-25 MED ORDER — HALOPERIDOL LACTATE 5 MG/ML IJ SOLN: 2.5000 mg | INTRAMUSCULAR | Status: DC | PRN

## 2023-11-25 MED ORDER — SODIUM CHLORIDE 0.9% FLUSH: 3.0000 mL | INTRAVENOUS | Status: DC | PRN

## 2023-11-25 MED ORDER — SODIUM CHLORIDE 0.9% FLUSH
3.0000 mL | Freq: Two times a day (BID) | INTRAVENOUS | Status: DC
  Administered 2023-11-25: 3 mL via INTRAVENOUS

## 2023-11-25 MED ORDER — SODIUM CHLORIDE 0.9 % IV SOLN
25.0000 mg | Freq: Four times a day (QID) | INTRAVENOUS | Status: DC | PRN
  Filled 2023-11-25: qty 1

## 2023-11-25 MED ORDER — ONDANSETRON 4 MG PO TBDP: 4.0000 mg | ORAL_TABLET | Freq: Four times a day (QID) | ORAL | Status: DC | PRN

## 2023-11-25 MED ORDER — MORPHINE 100MG IN NS 100ML (1MG/ML) PREMIX INFUSION
0.0000 mg/h | INTRAVENOUS | Status: DC
  Administered 2023-11-25: 5 mg/h via INTRAVENOUS
  Filled 2023-11-25: qty 100

## 2023-11-25 MED ORDER — ACETAMINOPHEN 325 MG PO TABS: 650.0000 mg | ORAL_TABLET | Freq: Four times a day (QID) | ORAL | Status: DC | PRN

## 2023-11-25 MED ORDER — MORPHINE SULFATE (PF) 2 MG/ML IV SOLN
2.0000 mg | INTRAVENOUS | Status: DC | PRN
  Administered 2023-11-25: 4 mg via INTRAVENOUS
  Administered 2023-11-25 (×2): 2 mg via INTRAVENOUS
  Filled 2023-11-25 (×2): qty 2
  Filled 2023-11-25: qty 1

## 2023-11-25 MED ORDER — GLYCOPYRROLATE 0.2 MG/ML IJ SOLN
0.1000 mg | Freq: Three times a day (TID) | INTRAMUSCULAR | Status: DC
  Administered 2023-11-25: 0.1 mg via INTRAVENOUS
  Filled 2023-11-25: qty 1

## 2023-11-25 MED ORDER — SCOPOLAMINE 1 MG/3DAYS TD PT72
1.0000 | MEDICATED_PATCH | TRANSDERMAL | Status: DC
  Administered 2023-11-25: 1.5 mg via TRANSDERMAL
  Filled 2023-11-25: qty 1

## 2023-11-25 MED ORDER — ONDANSETRON HCL 4 MG/2ML IJ SOLN: 4.0000 mg | Freq: Four times a day (QID) | INTRAMUSCULAR | Status: DC | PRN

## 2023-11-25 MED ORDER — GLYCOPYRROLATE 1 MG PO TABS
1.0000 mg | ORAL_TABLET | ORAL | Status: DC | PRN
  Filled 2023-11-25: qty 1

## 2023-11-25 MED ORDER — GLYCOPYRROLATE 0.2 MG/ML IJ SOLN: 0.2000 mg | INTRAMUSCULAR | Status: DC | PRN

## 2023-11-25 MED ORDER — DIPHENHYDRAMINE HCL 50 MG/ML IJ SOLN: 25.0000 mg | INTRAMUSCULAR | Status: DC | PRN

## 2023-11-25 MED ORDER — MORPHINE BOLUS VIA INFUSION
5.0000 mg | INTRAVENOUS | Status: DC | PRN
  Administered 2023-11-25 (×2): 5 mg via INTRAVENOUS

## 2023-11-25 MED ORDER — POLYVINYL ALCOHOL 1.4 % OP SOLN
1.0000 [drp] | Freq: Four times a day (QID) | OPHTHALMIC | Status: DC | PRN
  Filled 2023-11-25: qty 15

## 2023-11-25 MED ORDER — ACETAMINOPHEN 650 MG RE SUPP: 650.0000 mg | Freq: Four times a day (QID) | RECTAL | Status: DC | PRN

## 2023-12-20 NOTE — Progress Notes (Signed)
 This chaplain responded to PMT NP-Michelle referral for spiritual care in the setting of EOL and the family's decision for comfort care.   Upon arrival on the unit, the chaplain learned of the Pt. death. The chaplain entered the Pt. room per the invitation of the Pt. daughter-Prachi and listened reflectively as the daughter described the Pt. peaceful physical and religious experience of death.   The chaplain accepted the family's invitation for intercessory prayer.  Chaplain Leeroy Hummer [phone]

## 2023-12-20 NOTE — Death Summary Note (Signed)
 DEATH SUMMARY   Patient Details  Name: Cheryl Monroe MRN: [id number] DOB: 16-Oct-1935  Admission/Discharge Information   Admit Date:  12/11/23  Date of Death: Date of Death: 12/16/23  Time of Death: Time of Death: 1348  Length of Stay: 4  Referring Physician: Charlanne Fredia CROME, MD    Diagnoses  Preliminary cause of death: multifocal pneumonia  Secondary Diagnoses (including complications and co-morbidities):  Septic encephalopathy Alzheimer's dementia Severe muscular deconditioning  Brief Hospital Course (including significant findings, care, treatment, and services provided and events leading to death)  [name] is a 89 y.o. female, nursing home resident [facility], with history of Alzheimer's dementia, progressive aphasia, dysphagia, neurogenic bladder with UTI, hypertension, aortic aneurysm, recent admission 1/17-23 for sepsis secondary to presumed UTI, who was brought in from nursing home due to acute decompensation and respiratory failure after witnessed vomiting event.  Patient became cyanotic, placed on a nonrebreather with EMS and in the emergency department maintained on BiPAP.    Per chart review and discussion with daughter that on 1/29 she was noted to have desaturations to 84% on room air the night prior, placed on 2 L oxygen.  Chest x-ray reportedly with left lower lobe pneumonia and they discussed with family who preferred observation.  Not started on antibiotics at that time.    Otherwise has not had recent illness to daughter's knowledge.  Her oral intake has been good and she likes to eat, maintains a dysphagia 3 diet.  Has been getting weight at the nursing home.  Her current level of function is very debilitated and mostly bedbound, transfers to and from wheelchair with [facility] lift.  She can recognize her children but otherwise has disorientation related to her Alzheimer's.   With antibiotics, as-needed BIPAP, and IV hydration, patient did not improve either  from breathing standpoint or mental status.  After discussion regarding patient's values and wishes, she was transitioned to comfort care and passed peacefully at bedside on 2023-12-16.   Pertinent Labs and Studies  Significant Diagnostic Studies DG Abd Portable 1V Result Date: 11/23/2023 CLINICAL DATA:  89 year old female with history of constipation. EXAM: PORTABLE ABDOMEN - 1 VIEW COMPARISON:  Abdominal radiograph 11/21/2023. FINDINGS: Gas and stool are seen scattered throughout the colon extending to the level of the distal rectum. No pathologic distension of small bowel is noted. Stool burden does not appear excessive. No gross evidence of pneumoperitoneum. IMPRESSION: 1. Nonobstructive bowel gas pattern. 2. Stool burden does not appear excessive. Electronically Signed   By: Toribio Aye M.D.   On: 11/23/2023 07:50   DG Chest Port 1 View Result Date: 11/23/2023 CLINICAL DATA:  89 year old female with history of shortness of breath. EXAM: PORTABLE CHEST 1 VIEW COMPARISON:  Chest x-ray 11/22/2023. FINDINGS: Lung volumes are normal. Diffuse interstitial prominence, widespread peribronchial cuffing and patchy ill-defined opacities are noted throughout the lungs bilaterally, most evident in the left upper lobe and right mid to lower lung, concerning for bronchitis with multilobar bilateral bronchopneumonia. No pleural effusions. No pneumothorax. No evidence of pulmonary edema. Heart size is normal. Upper mediastinal contours are within normal limits. IMPRESSION: 1. The appearance of the chest is concerning for bronchitis with multilobar bilateral bronchopneumonia, as above. Electronically Signed   By: Toribio Aye M.D.   On: 11/23/2023 07:49   CT HEAD WO CONTRAST ( ) Result Date: 11/22/2023 CLINICAL DATA:  Mental status change, unknown cause. EXAM: CT HEAD WITHOUT CONTRAST TECHNIQUE: Contiguous axial images were obtained from the base of the skull through  the vertex without intravenous contrast.  RADIATION DOSE REDUCTION: This exam was performed according to the departmental dose-optimization program which includes automated exposure control, adjustment of the mA and/or kV according to patient size and/or use of iterative reconstruction technique. COMPARISON:  Head CT 05/31/2023 FINDINGS: Brain: There is no evidence of an acute infarct, intracranial hemorrhage, mass, midline shift, or extra-axial fluid collection. Patchy hypodensities in the cerebral white matter are unchanged and nonspecific but compatible with moderate chronic small vessel ischemic disease. There is mild cerebral atrophy. Vascular: Calcified atherosclerosis at the skull base. No hyperdense vessel. Skull: No acute fracture or suspicious osseous lesion. Sinuses/Orbits: Visualized paranasal sinuses and mastoid air cells are clear. Bilateral cataract extraction. Other: None. IMPRESSION: 1. No evidence of acute intracranial abnormality. 2. Moderate chronic small vessel ischemic disease. Electronically Signed   By: Dasie Hamburg M.D.   On: 11/22/2023 16:56   EEG adult Result Date: 11/22/2023 Shelton Arlin KIDD, MD     11/22/2023 10:03 AM Patient Name: Cheryl Monroe MRN: [id number] Epilepsy Attending: Arlin KIDD Shelton Referring Physician/Provider: Claudene Toribio BROCKS, MD Date: 11/22/2023 Duration: 24.09 mins Patient history: 89yo F with ams. EEG to evaluate for seizure Level of alertness: lethargic /sleep AEDs during EEG study: None Technical aspects: This EEG study was done with scalp electrodes positioned according to the 10-20 International system of electrode placement. Electrical activity was reviewed with band pass filter of 1-70Hz , sensitivity of 7 uV/mm, display speed of 22mm/sec with a 60Hz  notched filter applied as appropriate. EEG data were recorded continuously and digitally stored.  Video monitoring was available and reviewed as appropriate. Description: EEG showed continuous generalized 3 to 6 Hz theta-delta slowing. Hyperventilation and  photic stimulation were not performed.   ABNORMALITY - Continuous slow, generalized IMPRESSION: This study is suggestive of moderate diffuse encephalopathy. No seizures or epileptiform discharges were seen throughout the recording. Arlin KIDD Shelton   DG Chest Port 1 View Result Date: 11/22/2023 CLINICAL DATA:  89 year old female with history of respiratory distress concerning for pneumonia. EXAM: PORTABLE CHEST 1 VIEW COMPARISON:  Chest x-ray 11/20/2023. FINDINGS: Lung volumes are low. Widespread but patchy areas of interstitial prominence, peribronchial cuffing and patchy ill-defined opacities are noted throughout the lungs bilaterally, most confluent in the left upper lobe and right lung base. No definite pleural effusions. No pneumothorax. No evidence of pulmonary edema. Heart size is upper limits of normal. The patient is rotated to the right on today's exam, resulting in distortion of the mediastinal contours and reduced diagnostic sensitivity and specificity for mediastinal pathology. Atherosclerotic calcifications in the thoracic aorta. IMPRESSION: 1. The appearance of the chest is concerning for multilobar bilateral pneumonia, as above. 2. Aortic atherosclerosis. Electronically Signed   By: Toribio Aye M.D.   On: 11/22/2023 07:29   DG Abd 1 View Result Date: 11/21/2023 CLINICAL DATA:  Recent aspiration EXAM: ABDOMEN - 1 VIEW COMPARISON:  None Available. FINDINGS: Scattered large and small bowel gas is noted. No free air is seen. No abnormal mass or abnormal calcifications are noted. Contrast is noted within the bladder from recent CT. IMPRESSION: No acute abnormality noted. Electronically Signed   By: Oneil Devonshire M.D.   On: 11/21/2023 02:49   CT Angio Chest PE W and/or Wo Contrast Result Date: 11/20/2023 CLINICAL DATA:  Pulmonary embolism (PE) suspected, high prob. Leukocytosis EXAM: CT ANGIOGRAPHY CHEST WITH CONTRAST TECHNIQUE: Multidetector CT imaging of the chest was performed using the  standard protocol during bolus administration of intravenous contrast. Multiplanar CT image reconstructions  and MIPs were obtained to evaluate the vascular anatomy. RADIATION DOSE REDUCTION: This exam was performed according to the departmental dose-optimization program which includes automated exposure control, adjustment of the mA and/or kV according to patient size and/or use of iterative reconstruction technique. CONTRAST:  65mL OMNIPAQUE  IOHEXOL  350 MG/ML SOLN COMPARISON:  02/04/2023 FINDINGS: Cardiovascular: Satisfactory opacification of the pulmonary arteries to the segmental level. No evidence of pulmonary embolism. Mid ascending thoracic aorta measures 4.0 cm in diameter, stable. Scattered atherosclerotic vascular calcifications of the aorta and coronary arteries. Normal heart size. No pericardial effusion. Mediastinum/Nodes: No axillary, mediastinal, or hilar lymphadenopathy 4.7 cm fluid attenuation nodule within the left thyroid  lobe, unchanged. This results in rightward tracheal deviation. Esophagus within normal limits. Lungs/Pleura: Extensive patchy bilateral airspace consolidations, most pronounced within the perihilar regions. No pleural effusion or pneumothorax. Upper Abdomen: No acute abnormality. Musculoskeletal: No chest wall abnormality. No acute or significant osseous findings. Review of the MIP images confirms the above findings. IMPRESSION: 1. No evidence of pulmonary embolism. 2. Extensive patchy bilateral airspace consolidations, most pronounced within the perihilar regions. Findings are most compatible with multifocal pneumonia. Radiographic follow-up to resolution is recommended. 3. Stable 4.7 cm left thyroid  nodule. In the setting of significant comorbidities or limited life expectancy, no follow-up recommended (ref: J Am Coll Radiol. 2015 Feb;12(2): 143-50). 4. Aortic and coronary artery atherosclerosis (ICD10-I70.0). Electronically Signed   By: Mabel Converse D.O.   On: 11/20/2023  21:53   DG Chest Port 1 View Result Date: 11/20/2023 CLINICAL DATA:  Concern for sepsis.  Vomiting. EXAM: PORTABLE CHEST 1 VIEW COMPARISON:  Chest radiograph dated June 24, 2023. FINDINGS: The heart size and mediastinal contours are within normal limits. Aortic atherosclerosis. Streaky opacities in the bilateral lower lungs, more pronounced on the right. No pleural effusion or pneumothorax. No acute osseous abnormality. IMPRESSION: Streaky opacities in the bilateral lower lungs, more pronounced on the right, could reflect atelectasis or infiltrate. Electronically Signed   By: Harrietta Sherry M.D.   On: 11/20/2023 15:46   CT ABDOMEN PELVIS W CONTRAST Result Date: 11/08/2023 CLINICAL DATA:  Bowel obstruction EXAM: CT ABDOMEN AND PELVIS WITH CONTRAST TECHNIQUE: Multidetector CT imaging of the abdomen and pelvis was performed using the standard protocol following bolus administration of intravenous contrast. RADIATION DOSE REDUCTION: This exam was performed according to the departmental dose-optimization program which includes automated exposure control, adjustment of the mA and/or kV according to patient size and/or use of iterative reconstruction technique. CONTRAST:  75mL OMNIPAQUE  IOHEXOL  350 MG/ML SOLN COMPARISON:  None Available. FINDINGS: Lower chest: Extensive multi-vessel coronary artery calcification. Global cardiac size within normal limits. There is dilation of the ascending thoracic aorta which measures at least 4.0 cm in diameter and is incompletely visualized on this examination. This is similar, however, to prior examination 02/04/2023. Bibasilar dependent atelectasis. Hepatobiliary: Evaluation of the upper abdominal solid organs is limited by respiratory motion artifact. Allowing for this, the liver and gallbladder are unremarkable. No intra or extrahepatic biliary ductal dilation. Pancreas: Unremarkable Spleen: Unremarkable Adrenals/Urinary Tract: The adrenal glands are unremarkable. Mild  asymmetric right renal cortical atrophy. The kidneys are normal in position. Mild asymmetric dilation of the right renal pelvis with decompression of the right ureter suggests a mild UPJ obstruction. No intrarenal or ureteral calculi. No hydronephrosis on the left. The bladder is unremarkable. Stomach/Bowel: Stomach is within normal limits. Appendix appears normal. No evidence of bowel wall thickening, distention, or inflammatory changes. Vascular/Lymphatic: There is dilation of the left gonadal vein with bilateral  adnexal varices identified suggesting changes of ovarian vein reflux and pelvic venous insufficiency. Moderate atherosclerotic calcification within the abdominal aorta. No aortic aneurysm. No pathologic adenopathy within the abdomen and pelvis. Reproductive: Pessary in place. Pelvic organs are otherwise unremarkable. Other: Diastasis of the rectus abdominus musculature noted. No abdominopelvic ascites Musculoskeletal: The osseous structures are age-appropriate. No acute bone abnormality IMPRESSION: 1. No acute intra-abdominal pathology identified. No definite radiographic explanation for the patient's reported symptoms. 2. Extensive multi-vessel coronary artery calcification. 3. Dilation of the ascending thoracic aorta which measures at least 4.0 cm in diameter and is incompletely visualized on this examination. This is similar, however, to prior examination 02/04/2023. Follow-up recommendations are as outlined on prior examination. 4. 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 5. Dilation of the left gonadal vein with bilateral adnexal varices identified suggesting changes of ovarian vein reflux and pelvic venous insufficiency. Aortic Atherosclerosis (ICD10-I70.0). Electronically Signed   By: Dorethia Molt M.D.   On: 11/08/2023 01:21    Microbiology Recent Results (from the past 240 hours)  Resp panel by RT-PCR (RSV, Flu A&B, Covid) Anterior Nasal Swab     Status: None    Collection Time: 11/20/23  2:42 PM   Specimen: Anterior Nasal Swab  Result Value Ref Range Status   SARS Coronavirus 2 by RT PCR NEGATIVE NEGATIVE Final   Influenza A by PCR NEGATIVE NEGATIVE Final   Influenza B by PCR NEGATIVE NEGATIVE Final    Comment: (NOTE) The Xpert Xpress SARS-CoV-2/FLU/RSV plus assay is intended as an aid in the diagnosis of influenza from Nasopharyngeal swab specimens and should not be used as a sole basis for treatment. Nasal washings and aspirates are unacceptable for Xpert Xpress SARS-CoV-2/FLU/RSV testing.  Fact Sheet for Patients: bloggercourse.com  Fact Sheet for Healthcare Providers: seriousbroker.it  This test is not yet approved or cleared by the [place]  FDA and has been authorized for detection and/or diagnosis of SARS-CoV-2 by FDA under an Emergency Use Authorization (EUA). This EUA will remain in effect (meaning this test can be used) for the duration of the COVID-19 declaration under Section 564(b)(1) of the Act, 21 U.S.C. section 360bbb-3(b)(1), unless the authorization is terminated or revoked.     Resp Syncytial Virus by PCR NEGATIVE NEGATIVE Final    Comment: (NOTE) Fact Sheet for Patients: bloggercourse.com  Fact Sheet for Healthcare Providers: seriousbroker.it  This test is not yet approved or cleared by the [place]  FDA and has been authorized for detection and/or diagnosis of SARS-CoV-2 by FDA under an Emergency Use Authorization (EUA). This EUA will remain in effect (meaning this test can be used) for the duration of the COVID-19 declaration under Section 564(b)(1) of the Act, 21 U.S.C. section 360bbb-3(b)(1), unless the authorization is terminated or revoked.  Performed at [facility], [address], [place], [place] [address]   Respiratory (~20 pathogens) panel by PCR     Status: None   Collection Time:  11/20/23  2:42 PM   Specimen: Nasopharyngeal Swab; Respiratory  Result Value Ref Range Status   Adenovirus NOT DETECTED NOT DETECTED Final   Coronavirus 229E NOT DETECTED NOT DETECTED Final    Comment: (NOTE) The Coronavirus on the Respiratory Panel, DOES NOT test for the novel  Coronavirus (2019 nCoV)    Coronavirus HKU1 NOT DETECTED NOT DETECTED Final   Coronavirus NL63 NOT DETECTED NOT DETECTED Final   Coronavirus OC43 NOT DETECTED NOT DETECTED Final   Metapneumovirus NOT DETECTED NOT DETECTED Final  Rhinovirus / Enterovirus NOT DETECTED NOT DETECTED Final   Influenza A NOT DETECTED NOT DETECTED Final   Influenza B NOT DETECTED NOT DETECTED Final   Parainfluenza Virus 1 NOT DETECTED NOT DETECTED Final   Parainfluenza Virus 2 NOT DETECTED NOT DETECTED Final   Parainfluenza Virus 3 NOT DETECTED NOT DETECTED Final   Parainfluenza Virus 4 NOT DETECTED NOT DETECTED Final   Respiratory Syncytial Virus NOT DETECTED NOT DETECTED Final   Bordetella pertussis NOT DETECTED NOT DETECTED Final   Bordetella Parapertussis NOT DETECTED NOT DETECTED Final   Chlamydophila pneumoniae NOT DETECTED NOT DETECTED Final   Mycoplasma pneumoniae NOT DETECTED NOT DETECTED Final    Comment: Performed at [facility], [address] [place], [place] [address]  Blood Culture (routine x 2)     Status: None   Collection Time: 11/20/23  3:38 PM   Specimen: BLOOD  Result Value Ref Range Status   Specimen Description BLOOD RIGHT ANTECUBITAL  Final   Special Requests   Final    BOTTLES DRAWN AEROBIC AND ANAEROBIC Blood Culture results may not be optimal due to an inadequate volume of blood received in culture bottles   Culture   Final    NO GROWTH 5 DAYS Performed at [facility], [address], [place], [place] [address]    Report Status 2023/12/07 FINAL  Final  Blood Culture (routine x 2)     Status: None   Collection Time: 11/20/23  3:50 PM   Specimen: BLOOD  Result Value Ref Range Status    Specimen Description BLOOD LEFT ANTECUBITAL  Final   Special Requests   Final    BOTTLES DRAWN AEROBIC AND ANAEROBIC Blood Culture results may not be optimal due to an inadequate volume of blood received in culture bottles   Culture   Final    NO GROWTH 5 DAYS Performed at [facility], [address], [place], [place] [address]    Report Status Dec 07, 2023 FINAL  Final  MRSA Next Gen by PCR, Nasal     Status: None   Collection Time: 11/21/23  5:40 AM   Specimen: Nasal Mucosa; Nasal Swab  Result Value Ref Range Status   MRSA by PCR Next Gen NOT DETECTED NOT DETECTED Final    Comment: (NOTE) The GeneXpert MRSA Assay (FDA approved for NASAL specimens only), is one component of a comprehensive MRSA colonization surveillance program. It is not intended to diagnose MRSA infection nor to guide or monitor treatment for MRSA infections. Test performance is not FDA approved in patients less than 49 years old. Performed at [facility], [address] [place], [place] [address]     Lab Basic Metabolic Panel: Recent Labs  Lab 11/21/23 0406 11/21/23 0421 11/22/23 0319 11/23/23 0836 11/24/23 0446 12-07-23 0451  NA 142 143 146* 148* 145 145  K 4.1 4.1 3.5 3.1* 3.3* 4.4  CL 104  --  107 105 106 105  CO2 28  --  27 32 30 29  GLUCOSE 217*  --  200* 176* 201* 110*  BUN 18  --  27* 22 24* 23  CREATININE 0.74  --  0.89 0.75 0.72 0.73  CALCIUM 8.8*  --  9.1 9.2 9.2 8.9  MG 1.9  --  2.8* 2.5* 2.4 2.4  PHOS 3.1  --   --   --   --   --    Liver Function Tests: Recent Labs  Lab 11/20/23 1510  AST 37  ALT 27  ALKPHOS 78  BILITOT 0.6  PROT 6.6  ALBUMIN 3.2*   No results for input(s): LIPASE, AMYLASE in the last 168 hours. Recent Labs  Lab 11/22/23 0319 11/23/23 0836  AMMONIA 29 40*   CBC: Recent Labs  Lab 11/20/23 1510 11/20/23 1518 11/21/23 0406 11/21/23 0421 11/22/23 0319 11/23/23 0836 11/24/23 0446 12-02-23 0451  WBC 14.3*  --  10.2  --  11.6*  11.7* 12.0* 12.4*  NEUTROABS 11.8*  --   --   --   --   --   --  10.8*  HGB 13.2   < > 13.3 13.9 11.8* 11.5* 12.1 11.7*  HCT 43.0   < > 43.6 41.0 39.0 37.5 39.0 38.5  MCV 91.3  --  92.2  --  92.0 90.4 90.9 92.1  PLT 206  --  157  --  167 180 192 180   < > = values in this interval not displayed.   Cardiac Enzymes: No results for input(s): CKTOTAL, CKMB, CKMBINDEX, TROPONINI in the last 168 hours. Sepsis Labs: Recent Labs  Lab 11/20/23 1518 11/20/23 1804 11/20/23 2244 11/21/23 0406 11/22/23 0319 11/23/23 0836 11/24/23 0446 12/02/2023 0451  PROCALCITON  --   --   --   --  18.27 8.96  --   --   WBC  --   --   --  10.2 11.6* 11.7* 12.0* 12.4*  LATICACIDVEN 1.5 2.2* 2.9* 2.4*  --   --   --   --       Toribio JAYSON Sharps 11/26/2023, 4:52 PM

## 2023-12-20 NOTE — Progress Notes (Signed)
 On call provider notified of patients condition changed related to concerns noted by patients daughter who is rooming in with patient. Patient has notable retractable breathing and accessory muscle use with tachypnea. Patient BP elevated in response to pain and discomfort as well as dyspnea.     2023/12/20    4:28 AM 20-Dec-2023    4:00 AM 11/24/2023   11:37 PM  Vitals with BMI  Systolic 156 158 830  Diastolic 68 70 74  Pulse 67  64   On call provider at bedside, see new orders. See interventions given in [facility] related to patients condition. At this time patients daughter notified of changes and tentative plan.

## 2023-12-20 NOTE — Progress Notes (Signed)
Pt taken off [facility] and placed on 2L [place] by RT, per MD order. MD notified, RN aware.

## 2023-12-20 NOTE — Plan of Care (Signed)

## 2023-12-20 NOTE — Plan of Care (Signed)

## 2023-12-20 NOTE — Progress Notes (Signed)
 Per RN request, evaluated at bedside. Daughter had expressed concerns about retractions with respiration.    Daughter and patient both asleep during my assessment. BP (!) 169/74 (BP Location: Right Arm)   Pulse 64   Temp 98.4 F (36.9 C) (Axillary)   Resp 20   SpO2 96%  Mild squeaking inspiratory sounds, tachypnea. Not using accessory muscles.   D/w RN-- she has not seen a significant response to the 2mg  dose of morphine . We discussed increasing the dose to provide a range to ensure comfort overnight. Per chart review, tentative plans for repeat GOC discussion in the morning. Agree that this would be helpful.  Cheryl SHAUNNA Gaskins, DO 05-Dec-2023 1:00 AM [facility]  For contact information, see Amion. If no response to pager, please call PCCM consult pager. After hours, 7PM- 7AM, please call Elink.

## 2023-12-20 NOTE — Progress Notes (Signed)
 12-06-23     I have seen and evaluated the patient for aspiration and encephalopathy.   S:  Having more secretion issues overnight.   O: Blood pressure 96/66, pulse 64, temperature 97.6 F (36.4 C), temperature source Oral, resp. rate 17, height 5' 3 (1.6 m), weight 81.6 kg, SpO2 94 %.    No distress Some hiccoughs noted vs. Sonorous respirations Occasional accessory muscle use   A:  Aspiration pneumonia and likely septic encephalopathy   P:  No improvement with aggressive treatment trial over past several days  In speaking with family regarding patient's values, quality rather than quantity of life is important  With this in mind we will transition to a comfort-based treatment approach and allow natural, peaceful passing.  Appreciate Michelle's help from palliative.  Will check on patient and family in a few hours.   Rolan Sharps MD [facility] Prefer epic messenger for cross cover needs If after hours, please call E-link

## 2023-12-20 NOTE — Progress Notes (Signed)
 Pt has absence of vital signs, and absence of pulse and breath sounds. 2nd RN verification by Kara, RN. Attending physician Toribio Sharps, MD, and charge RN Santana made aware. Family at bedside, and condolences given to family on behalf of [facility] hospital. Pt expired peacefully with family at bedside.

## 2023-12-20 NOTE — Consult Note (Signed)
 Palliative Medicine Inpatient Consult Note  Consulting Provider: Dr. Dennise  Reason for consult:  Dementia, asp PNA,GOC  11/28/2023  HPI:  Per intake H&P --> 89 year old female, skilled nursing facility resident, with past medical history significant for dementia, progressive aphasia, dysphagia, neurogenic bladder, UTI, hypertension, and aortic aneurysm. Patient was recently admitted from January 17 through November 13, 2023 with sepsis that is presumed to be secondary to UTI. Patient has been admitted with aspiration pneumonia, acute combined hypoxic and hypercapnic respiratory failure (currently on BiPAP). Pulmonary input is appreciated. EEG has not revealed any seizures. She has continued to be unresponsive on IV antibiotics, BiPAP with gradually worsening pneumonia. Palliative care has been asked to get involved to further support goals of care conversations.   Clinical Assessment/Goals of Care:  *Please note that this is a verbal dictation therefore any spelling or grammatical errors are due to the Dragon [employer] One system interpretation.  I have reviewed medical records including EPIC notes, labs and imaging, received report from bedside RN, assessed the patient who is lying in bed on HFNC, noted to have diaphragmatic breathing.    Dr. Claudene and I met with patients son,  Raji and daughter, Bubba to further discuss diagnosis prognosis, GOC, EOL wishes, disposition and options.   I introduced Palliative Medicine as specialized medical care for people living with serious illness. It focuses on providing relief from the symptoms and stress of a serious illness. The goal is to improve quality of life for both the patient and the family.  Medical History Review and Understanding:  A review of patients past medical history inclusive of dementia, aphasia, recurrent UTI's, HTN, and aortic aneurysm was completed.  Social History:  Cheryl Monroe is from [place] originally. She spend most  of her adult years in [place] . She has a spouse, Harshad. She has three children and two grandchildren. She formerly worked for a short period as a actor. She spent the majority of her life being a full time mother. Her children note what a wonderful mother she was, what a splendid sense of humor she had, and how she loves shopping. She has a strong Agricultural engineer.   Functional and Nutritional State:  Preceding hospitalization, [name] was living in the skilled nursing part of [facility]. She has required more and more help over the years as she has been there since 2021.   Advance Directives:  A detailed discussion was had today regarding advanced directives.    Code Status:  Concepts specific to code status, artifical feeding and hydration, continued IV antibiotics and rehospitalization was had.  The difference between a aggressive medical intervention path  and a palliative comfort care path for this patient at this time was had.   Kayden is an established DNAR/DNI code status.   Discussion:  Dr. Claudene reviewed the significance of Cheryl Monroe's aspiration event. He notes the concern that she likely continues to aspirate. He shares that it would be reasonable to shift our focus towards more of a comfort emphasis. Plan will be to initiate a morphine  gtt, robinul  ATC, stop mIVF, and titrate off of HFNC to regular [place].   After Dr. Claudene left I was able to spend time learning more about the person Cheryl Monroe was. Patients daughter shares the difficulties that she endured in her life and ultimately how she persevered. We reflected upon how supportive patients children have been of her throughout the years she has suffered from AD and the special things that they have done together.  We discussed the plan moving forward. I was able to confirm with patients family no more blood sugar sticks or needle draws. Patient son would prefer to continue antibiotics at this time.    Discussed the  importance of continued conversation with family and their  medical providers regarding overall plan of care and treatment options, ensuring decisions are within the context of the patients values and GOCs.  Provided [facility] booklet.  Decision Maker: [name], [name] (Son): [phone] (Work Phone)   SUMMARY OF RECOMMENDATIONS   DNAR/DNI  Comfort care  Wean O2 to regular [place]  Morphine  gtt initiation  Glycopyrrolate  ATC and PRN in addition to a scopolamine  patch  Additional comfort medications per [facility]  Appreciate Chaplain involvement  Ongoing PMT support  Code Status/Advance Care Planning: DNAR/DNI  Palliative Prophylaxis:  Aspiration, Bowel Regimen, Delirium Protocol, Frequent Pain Assessment, Oral Care, Palliative Wound Care, and Turn Reposition  Additional Recommendations (Limitations, Scope, Preferences): Continue current care  Psycho-social/Spiritual:  Desire for further Chaplaincy support: Yes Additional Recommendations: Education on EOL   Prognosis: Limited hours to days  Discharge Planning: Discharge will be [facility]  Vitals:   12-13-23 0428 12/13/23 0850  BP: (!) 156/68   Pulse: 67   Resp: 18 (!) 22  Temp: (!) 101 F (38.3 C)   SpO2: 94% 95%    Intake/Output Summary (Last 24 hours) at 12-13-23 1022 Last data filed at Dec 13, 2023 0940 Gross per 24 hour  Intake 6 ml  Output 400 ml  Net -394 ml   Gen:  Elderly acutely ill appearing F HEENT: Dry mucous membranes PULM: On HCNC, diaphragmatic breathing  Neuro: Somnolent  PPS: 10%   This conversation/these recommendations were discussed with patient primary care team, Dr. Claudene  Total Time: 95 Billing based on MDM: High ______________________________________________________ Ros[facility] Palliative Medicine Team Team Cell Phone: [phone] Please utilize secure chat with additional questions, if there is no response within 30 minutes please call the above phone  number  Palliative Medicine Team providers are available by phone from 7am to 7pm daily and can be reached through the team cell phone.  Should this patient require assistance outside of these hours, please call the patient's attending physician.

## 2023-12-20 DEATH — deceased
